# Patient Record
Sex: Male | Born: 1975 | Race: Black or African American | Hispanic: No | Marital: Married | Smoking: Never smoker
Health system: Southern US, Community
[De-identification: ages and names within clinical notes are randomized; demographics above are authoritative.]

## PROBLEM LIST (undated history)

## (undated) ENCOUNTER — Ambulatory Visit (HOSPITAL_COMMUNITY): Admission: EM | Discharge status: Absent without leave | Payer: Managed Care, Other (non HMO)

## (undated) DIAGNOSIS — D689 Coagulation defect, unspecified: Secondary | ICD-10-CM

## (undated) DIAGNOSIS — Z9889 Other specified postprocedural states: Secondary | ICD-10-CM

## (undated) DIAGNOSIS — D126 Benign neoplasm of colon, unspecified: Secondary | ICD-10-CM

## (undated) DIAGNOSIS — M109 Gout, unspecified: Secondary | ICD-10-CM

## (undated) DIAGNOSIS — K219 Gastro-esophageal reflux disease without esophagitis: Secondary | ICD-10-CM

## (undated) DIAGNOSIS — G4726 Circadian rhythm sleep disorder, shift work type: Secondary | ICD-10-CM

## (undated) DIAGNOSIS — R0683 Snoring: Secondary | ICD-10-CM

## (undated) DIAGNOSIS — R918 Other nonspecific abnormal finding of lung field: Secondary | ICD-10-CM

## (undated) DIAGNOSIS — R112 Nausea with vomiting, unspecified: Secondary | ICD-10-CM

## (undated) DIAGNOSIS — T7840XA Allergy, unspecified, initial encounter: Secondary | ICD-10-CM

## (undated) DIAGNOSIS — E041 Nontoxic single thyroid nodule: Secondary | ICD-10-CM

## (undated) DIAGNOSIS — N289 Disorder of kidney and ureter, unspecified: Secondary | ICD-10-CM

## (undated) DIAGNOSIS — F419 Anxiety disorder, unspecified: Secondary | ICD-10-CM

## (undated) DIAGNOSIS — I82409 Acute embolism and thrombosis of unspecified deep veins of unspecified lower extremity: Secondary | ICD-10-CM

## (undated) DIAGNOSIS — N1831 Chronic kidney disease, stage 3a: Secondary | ICD-10-CM

## (undated) DIAGNOSIS — G4710 Hypersomnia, unspecified: Secondary | ICD-10-CM

## (undated) DIAGNOSIS — K589 Irritable bowel syndrome without diarrhea: Secondary | ICD-10-CM

## (undated) HISTORY — PX: HEMORRHOID BANDING: SHX5850

## (undated) HISTORY — DX: Allergy, unspecified, initial encounter: T78.40XA

## (undated) HISTORY — DX: Coagulation defect, unspecified: D68.9

## (undated) HISTORY — DX: Other specified postprocedural states: Z98.890

## (undated) HISTORY — PX: UPPER GASTROINTESTINAL ENDOSCOPY: SHX188

## (undated) HISTORY — DX: Chronic kidney disease, stage 3a: N18.31

## (undated) HISTORY — DX: Snoring: R06.83

## (undated) HISTORY — DX: Circadian rhythm sleep disorder, shift work type: G47.26

## (undated) HISTORY — PX: ORIF TIBIA & FIBULA FRACTURES: SHX2131

## (undated) HISTORY — DX: Gastro-esophageal reflux disease without esophagitis: K21.9

## (undated) HISTORY — DX: Gout, unspecified: M10.9

## (undated) HISTORY — DX: Hypersomnia, unspecified: G47.10

## (undated) HISTORY — PX: COLONOSCOPY: SHX174

## (undated) HISTORY — DX: Other specified postprocedural states: R11.2

## (undated) HISTORY — DX: Anxiety disorder, unspecified: F41.9

## (undated) HISTORY — PX: OTHER SURGICAL HISTORY: SHX169

## (undated) HISTORY — DX: Other nonspecific abnormal finding of lung field: R91.8

## (undated) HISTORY — PX: COLON SURGERY: SHX602

## (undated) HISTORY — PX: ESOPHAGEAL DILATION: SHX303

## (undated) HISTORY — DX: Benign neoplasm of colon, unspecified: D12.6

## (undated) HISTORY — DX: Nontoxic single thyroid nodule: E04.1

---

## 2002-04-01 ENCOUNTER — Emergency Department (HOSPITAL_COMMUNITY): Admission: EM | Admit: 2002-04-01 | Discharge: 2002-04-01 | Payer: Self-pay | Admitting: Emergency Medicine

## 2002-06-10 ENCOUNTER — Emergency Department (HOSPITAL_COMMUNITY): Admission: EM | Admit: 2002-06-10 | Discharge: 2002-06-11 | Payer: Self-pay | Admitting: Emergency Medicine

## 2002-06-11 ENCOUNTER — Encounter: Payer: Self-pay | Admitting: Emergency Medicine

## 2003-02-03 ENCOUNTER — Emergency Department (HOSPITAL_COMMUNITY): Admission: EM | Admit: 2003-02-03 | Discharge: 2003-02-03 | Payer: Self-pay | Admitting: *Deleted

## 2003-02-03 ENCOUNTER — Encounter: Payer: Self-pay | Admitting: *Deleted

## 2007-02-02 ENCOUNTER — Ambulatory Visit: Payer: Self-pay | Admitting: Cardiology

## 2008-02-15 ENCOUNTER — Ambulatory Visit: Payer: Self-pay | Admitting: Gastroenterology

## 2008-02-27 ENCOUNTER — Emergency Department (HOSPITAL_COMMUNITY): Admission: EM | Admit: 2008-02-27 | Discharge: 2008-02-27 | Payer: Self-pay | Admitting: Emergency Medicine

## 2008-04-17 ENCOUNTER — Ambulatory Visit: Payer: Self-pay | Admitting: Gastroenterology

## 2008-06-24 ENCOUNTER — Emergency Department (HOSPITAL_COMMUNITY): Admission: EM | Admit: 2008-06-24 | Discharge: 2008-06-24 | Payer: Self-pay | Admitting: Emergency Medicine

## 2008-09-30 ENCOUNTER — Encounter (INDEPENDENT_AMBULATORY_CARE_PROVIDER_SITE_OTHER): Payer: Self-pay | Admitting: *Deleted

## 2008-09-30 ENCOUNTER — Emergency Department (HOSPITAL_COMMUNITY): Admission: EM | Admit: 2008-09-30 | Discharge: 2008-09-30 | Payer: Self-pay | Admitting: *Deleted

## 2008-12-02 ENCOUNTER — Ambulatory Visit: Payer: Self-pay | Admitting: Gastroenterology

## 2008-12-02 DIAGNOSIS — R1310 Dysphagia, unspecified: Secondary | ICD-10-CM

## 2008-12-02 DIAGNOSIS — R079 Chest pain, unspecified: Secondary | ICD-10-CM

## 2008-12-12 ENCOUNTER — Ambulatory Visit: Payer: Self-pay | Admitting: Gastroenterology

## 2008-12-13 ENCOUNTER — Telehealth: Payer: Self-pay | Admitting: Internal Medicine

## 2008-12-15 ENCOUNTER — Telehealth: Payer: Self-pay | Admitting: Gastroenterology

## 2009-01-16 ENCOUNTER — Ambulatory Visit: Payer: Self-pay | Admitting: Gastroenterology

## 2009-03-03 ENCOUNTER — Encounter: Payer: Self-pay | Admitting: Gastroenterology

## 2011-07-15 LAB — CBC
MCV: 87.6 fL (ref 78.0–100.0)
RBC: 5.3 MIL/uL (ref 4.22–5.81)
WBC: 7.2 10*3/uL (ref 4.0–10.5)

## 2011-07-15 LAB — POCT I-STAT, CHEM 8
BUN: 18 mg/dL (ref 6–23)
Creatinine, Ser: 1.6 mg/dL — ABNORMAL HIGH (ref 0.4–1.5)
Glucose, Bld: 108 mg/dL — ABNORMAL HIGH (ref 70–99)
Hemoglobin: 16.7 g/dL (ref 13.0–17.0)
Potassium: 4.2 mEq/L (ref 3.5–5.1)
Sodium: 141 mEq/L (ref 135–145)
TCO2: 27 mmol/L (ref 0–100)

## 2011-07-15 LAB — DIFFERENTIAL
Eosinophils Absolute: 0.3 10*3/uL (ref 0.0–0.7)
Eosinophils Relative: 5 % (ref 0–5)
Lymphocytes Relative: 35 % (ref 12–46)
Lymphs Abs: 2.5 10*3/uL (ref 0.7–4.0)
Monocytes Relative: 10 % (ref 3–12)

## 2011-07-15 LAB — POCT CARDIAC MARKERS
CKMB, poc: 1.8 ng/mL (ref 1.0–8.0)
CKMB, poc: 1.9 ng/mL (ref 1.0–8.0)
Myoglobin, poc: 109 ng/mL (ref 12–200)
Myoglobin, poc: 116 ng/mL (ref 12–200)

## 2011-11-22 ENCOUNTER — Ambulatory Visit: Payer: Self-pay | Admitting: Family Medicine

## 2011-12-05 ENCOUNTER — Emergency Department (HOSPITAL_COMMUNITY): Payer: Managed Care, Other (non HMO)

## 2011-12-05 ENCOUNTER — Encounter (HOSPITAL_COMMUNITY): Payer: Self-pay | Admitting: *Deleted

## 2011-12-05 ENCOUNTER — Emergency Department (HOSPITAL_COMMUNITY)
Admission: EM | Admit: 2011-12-05 | Discharge: 2011-12-05 | Disposition: A | Discharge status: Home / Self Care | Payer: Managed Care, Other (non HMO) | Attending: Emergency Medicine | Admitting: Emergency Medicine

## 2011-12-05 ENCOUNTER — Other Ambulatory Visit: Payer: Self-pay

## 2011-12-05 DIAGNOSIS — M545 Low back pain, unspecified: Secondary | ICD-10-CM | POA: Insufficient documentation

## 2011-12-05 DIAGNOSIS — H9190 Unspecified hearing loss, unspecified ear: Secondary | ICD-10-CM | POA: Insufficient documentation

## 2011-12-05 DIAGNOSIS — M542 Cervicalgia: Secondary | ICD-10-CM | POA: Insufficient documentation

## 2011-12-05 DIAGNOSIS — R079 Chest pain, unspecified: Secondary | ICD-10-CM | POA: Insufficient documentation

## 2011-12-05 DIAGNOSIS — M549 Dorsalgia, unspecified: Secondary | ICD-10-CM

## 2011-12-05 DIAGNOSIS — R51 Headache: Secondary | ICD-10-CM

## 2011-12-05 DIAGNOSIS — K589 Irritable bowel syndrome without diarrhea: Secondary | ICD-10-CM | POA: Insufficient documentation

## 2011-12-05 HISTORY — DX: Irritable bowel syndrome, unspecified: K58.9

## 2011-12-05 LAB — BASIC METABOLIC PANEL
Calcium: 9.2 mg/dL (ref 8.4–10.5)
Creatinine, Ser: 1.31 mg/dL (ref 0.50–1.35)
GFR calc Af Amer: 80 mL/min — ABNORMAL LOW (ref >90)
GFR calc non Af Amer: 69 mL/min — ABNORMAL LOW (ref >90)
Sodium: 140 mEq/L (ref 135–145)

## 2011-12-05 LAB — CBC
HCT: 44.5 % (ref 39.0–52.0)
MCHC: 34.6 g/dL (ref 30.0–36.0)
MCV: 86.1 fL (ref 78.0–100.0)
Platelets: 231 10*3/uL (ref 150–400)
RDW: 13.7 % (ref 11.5–15.5)

## 2011-12-05 LAB — DIFFERENTIAL
Basophils Absolute: 0.1 10*3/uL (ref 0.0–0.1)
Basophils Relative: 1 % (ref 0–1)
Eosinophils Absolute: 0.3 10*3/uL (ref 0.0–0.7)
Eosinophils Relative: 5 % (ref 0–5)
Monocytes Absolute: 0.7 10*3/uL (ref 0.1–1.0)

## 2011-12-05 NOTE — ED Provider Notes (Signed)
History     CSN: [id number]  Arrival date & time 12/05/11  1633   First MD Initiated Contact with Patient 12/05/11 1745      Chief Complaint  Patient presents with  . Chest Pain    (Consider location/radiation/quality/duration/timing/severity/associated sxs/prior treatment) HPI This 36 year old male has a chronic chest pain syndrome as well as chronic headaches neck pain and back pain.  Essentially several days a week if not every day per week he has several hours at a time of one or multiple symptoms including gradual onset headache and/or gradual onset neck pain and or left-sided chest pain and or low back pain. Each of these discomforts last for several hours at a time and can be associated with eating or activity or sleeping or seemingly non-provoked. He is no fever cough or shortness of breath abdominal pain vomiting or diarrhea or bloody stools. He is anxious and stressed but not suicidal or homicidal, he is not a threat to himself or others, is not hallucinating. He has no lateralizing weakness or numbness and no change in speech swallowing or understanding or significant change in vision. He does have some decreased hearing in his right ear since yesterday. The symptoms are not sudden onset. He has no shortness of breath or pleuritic chest pain. His chest wall is only sore and tender on the left side. He's had no treatment prior to arrival. Sometimes his chest discomfort is worse if he eats. All of his symptoms have gradually worsened over the last few months he has not seen his primary care doctor for them. His primary care doctor is located in [facility] by the name of McPherson(sp?).  All of the symptoms and presents today for several hours including headache neck pain back pain chest pain all day long today over 6 hours each. His symptoms are nonexertional and not sudden onset today. He has not tried any treatment other than Pepto-Bismol today which did not help he is  supposed to take Prevacid but has not taken it in months. Past Medical History  Diagnosis Date  . IBS (irritable bowel syndrome)   . Esophagitis     No past surgical history on file.  No family history on file.  History  Substance Use Topics  . Smoking status: Never Smoker   . Smokeless tobacco: Not on file  . Alcohol Use: No      Review of Systems  Constitutional: Negative for fever.       10 Systems reviewed and are negative for acute change except as noted in the HPI.  HENT: Positive for neck pain. Negative for congestion.   Eyes: Negative for discharge and redness.  Respiratory: Negative for cough and shortness of breath.   Cardiovascular: Positive for chest pain.  Gastrointestinal: Negative for vomiting and abdominal pain.  Musculoskeletal: Positive for back pain.  Skin: Negative for rash.  Neurological: Positive for headaches. Negative for syncope and numbness.  Psychiatric/Behavioral: Negative for hallucinations.       Anxiety    Allergies  Caffeine and Penicillins  Home Medications   Current Outpatient Rx  Name Route Sig Dispense Refill  . BISMUTH SUBSALICYLATE 262 MG PO CHEW Oral Chew 524 mg by mouth as needed.    Marland Kitchen FLAXSEED (LINSEED) PO Oral Take by mouth daily.    . IBUPROFEN 200 MG PO TABS Oral Take 200 mg by mouth as needed. For pain      BP 121/72  Pulse 72  Temp(Src) 97.9 F (36.6 C) (  Oral)  Resp 18  SpO2 98%  Physical Exam  Nursing note and vitals reviewed. Constitutional:       Awake, alert, nontoxic appearance with baseline speech for patient.  HENT:  Head: Atraumatic.  Mouth/Throat: No oropharyngeal exudate.  Eyes: EOM are normal. Pupils are equal, round, and reactive to light. Right eye exhibits no discharge. Left eye exhibits no discharge.  Neck: Neck supple.       Reproducible posterior paracervical nonmidline neck tenderness  Cardiovascular: Normal rate and regular rhythm.   No murmur heard. Pulmonary/Chest: Effort normal and  breath sounds normal. No stridor. No respiratory distress. He has no wheezes. He has no rales. He exhibits tenderness.       Partially reproducible left anterior lateral chest wall tenderness to palpation without rash or deformity  Abdominal: Soft. Bowel sounds are normal. He exhibits no mass. There is no tenderness. There is no rebound.  Musculoskeletal: He exhibits no edema and no tenderness.       Baseline ROM, moves extremities with no obvious new focal weakness.  Reproducible bilateral tender paralumbar soft tissue tenderness without midline lumbar tenderness  Lymphadenopathy:    He has no cervical adenopathy.  Neurological:       Awake, alert, cooperative and aware of situation; motor strength symmetric bilaterally; sensation normal to light touch bilaterally; peripheral visual fields full to confrontation; no facial asymmetry; tongue midline; major cranial nerves appear intact; no pronator drift, normal finger to nose bilaterally  Skin: No rash noted.  Psychiatric: He has a normal mood and affect.   right external auditory canal occluded by cerumen, left tympanic membrane clear  ED Course  Procedures (including critical care time) ECG: Normal sinus rhythm, ventricular rate 70, normal axis, normal intervals, impression normal ECG, no significant change compared with December 2009 Labs Reviewed  BASIC METABOLIC PANEL - Abnormal; Notable for the following:    GFR calc non Af Amer 69 (*)    GFR calc Af Amer 80 (*)    All other components within normal limits  TROPONIN I  CBC  DIFFERENTIAL  LAB REPORT - SCANNED   Dg Chest 2 View  12/05/2011  *RADIOLOGY REPORT*  Clinical Data: Chest pain.  CHEST - 2 VIEW  Comparison: 09/30/2008.  Findings: The cardiac silhouette, mediastinal and hilar contours are within normal limits and stable. The lungs are clear.  No pleural effusions.  The bony thorax is intact  IMPRESSION: No acute cardiopulmonary findings.  Original Report Authenticated By: P. Loralie Champagne, M.D.     1. Chest pain   2. Headache   3. Neck pain   4. Back pain       MDM  Pt stable in ED with no significant deterioration in condition.Patient / Family / Caregiver informed of clinical course, understand medical decision-making process, and agree with plan.I doubt any other EMC precluding discharge at this time including, but not necessarily limited to the following:high risk ACS, PE.        Hurman Horn, MD 12/06/11 [phone]

## 2011-12-05 NOTE — ED Notes (Signed)
No distress noted.  Pt denies pain at present time.  No dizziness or SOB.

## 2011-12-05 NOTE — ED Notes (Signed)
CP since yesterday that is intermittent, took [place]  today

## 2012-03-06 ENCOUNTER — Telehealth: Payer: Self-pay

## 2012-03-06 NOTE — Telephone Encounter (Signed)
PT STATES HE HAVE AN APPT WITH DR MCPHERSON ON THE 30TH AND WANT TO MAKE SURE WHAT ALL IS GOING TO BE DONE. HE THINK HE NEED TO FAST FOR A SUGAR CHECK, BUT WANTED TO VERTIFY. PLEASE CALL [phone]

## 2012-03-08 ENCOUNTER — Ambulatory Visit: Payer: Self-pay | Admitting: Family Medicine

## 2012-03-08 NOTE — Telephone Encounter (Signed)
Dr Audria Nine, I spoke w/pt and he seems to be very concerned to make sure he does not have DM. He has family hx and states that sometimes he feels a little dizzy and feels "kind of weird". He wondered if you can run some type of more accurate test for this instead of just the glucose on the CMET? Also do you want to do a lipid panel. Does pt need to be fasting? Pt has appt w/you next Thurs.

## 2012-03-08 NOTE — Telephone Encounter (Signed)
I reviewed pt's chart where he went to ED in February. We can discuss his concerns at his appt (I have never seen this gentleman). He will need to fast for labs (he can come in early the day of appointment to have blood drawn and I will order tests at time of visit based on what I think is appropriate).

## 2012-03-08 NOTE — Telephone Encounter (Signed)
Spoke to patient. He understands to come in fasting and the correct tests will be ordered.

## 2012-03-15 ENCOUNTER — Encounter: Payer: Self-pay | Admitting: Family Medicine

## 2012-03-15 ENCOUNTER — Ambulatory Visit (INDEPENDENT_AMBULATORY_CARE_PROVIDER_SITE_OTHER): Payer: Managed Care, Other (non HMO) | Admitting: Family Medicine

## 2012-03-15 VITALS — BP 133/88 | HR 69 | Temp 98.0°F | Resp 18 | Ht 74.75 in | Wt 311.6 lb

## 2012-03-15 DIAGNOSIS — E781 Pure hyperglyceridemia: Secondary | ICD-10-CM | POA: Insufficient documentation

## 2012-03-15 DIAGNOSIS — E669 Obesity, unspecified: Secondary | ICD-10-CM

## 2012-03-15 LAB — LIPID PANEL
Cholesterol: 171 mg/dL (ref 0–200)
Triglycerides: 136 mg/dL (ref <150)
VLDL: 27 mg/dL (ref 0–40)

## 2012-03-15 LAB — COMPREHENSIVE METABOLIC PANEL
AST: 20 U/L (ref 0–37)
Albumin: 4.1 g/dL (ref 3.5–5.2)
BUN: 15 mg/dL (ref 6–23)
CO2: 23 mEq/L (ref 19–32)
Calcium: 9.1 mg/dL (ref 8.4–10.5)
Chloride: 107 mEq/L (ref 96–112)
Glucose, Bld: 87 mg/dL (ref 70–99)
Potassium: 4.4 mEq/L (ref 3.5–5.3)

## 2012-03-15 NOTE — Progress Notes (Signed)
  Subjective:    Patient ID: Francis Lin, male    DOB: 08-Nov-1975, 36 y.o.   MRN: [id number]  HPI  Pt is 36 y.o. Obese AA male who has lipid disorder with elevated Triglycerides and low HDL.   He has started to be more active, playing with his kids. He works 3rd shift so his hours of activity  vary; he eats 2 meals daily on average (nutrition if fair at best). He does not get 7-8 hours of sleep and  water/fluid intake is not adequate.   Review of Systems  Constitutional: Positive for activity change. Negative for fever, appetite change, fatigue and unexpected weight change.  Respiratory: Negative for chest tightness and shortness of breath.   Cardiovascular: Negative for chest pain.  Gastrointestinal: Negative.   Neurological: Positive for headaches. Negative for dizziness, syncope, weakness and numbness.  Psychiatric/Behavioral: Positive for sleep disturbance.       Shift work sleep disturbance       Objective:   Physical Exam  Vitals reviewed. Constitutional: He is oriented to person, place, and time. He appears well-developed and well-nourished. No distress.  HENT:  Head: Normocephalic and atraumatic.  Eyes: Conjunctivae and EOM are normal. No scleral icterus.  Cardiovascular: Normal rate.   Pulmonary/Chest: Effort normal. No respiratory distress.  Musculoskeletal: Normal range of motion.  Neurological: He is alert and oriented to person, place, and time. No cranial nerve deficit.  Skin: Skin is warm and dry.  Psychiatric: He has a normal mood and affect. His behavior is normal.    Results for orders placed in visit on 03/15/12  POCT GLYCOSYLATED HEMOGLOBIN (HGB A1C)      Component Value Range   Hemoglobin A1C 5.1           Assessment & Plan:   1. Hypertriglyceridemia  Lipid panel, Comprehensive metabolic panel  2. Obesity -pt given print info about exercises for weight loss; encouraged more activity during warmer weather, increase water/ fluid intake (no sodas or  Kool-Aid), adequate sleep and better nutrition- he requires at least 1800 calories daily for better metabolism and weight reduction Lipid panel, Vitamin D, 25-hydroxy, POCT glycosylated hemoglobin (Hb A1C)

## 2012-03-15 NOTE — Patient Instructions (Signed)
Exercise to Lose Weight Exercise and a healthy diet may help you lose weight. Your doctor may suggest specific exercises. EXERCISE IDEAS AND TIPS  Choose low-cost things you enjoy doing, such as walking, bicycling, or exercising to workout videos.   Take stairs instead of the elevator.   Walk during your lunch break.   Park your car further away from work or school.   Go to a gym or an exercise class.   Start with 5 to 10 minutes of exercise each day. Build up to 30 minutes of exercise 4 to 6 days a week.   Wear shoes with good support and comfortable clothes.   Stretch before and after working out.   Work out until you breathe harder and your heart beats faster.   Drink extra water when you exercise.   Do not do so much that you hurt yourself, feel dizzy, or get very short of breath.     Diabetes Meal Planning Guide The diabetes meal planning guide is a tool to help you plan your meals and snacks. It is important for people with diabetes to manage their blood glucose (sugar) levels. Choosing the right foods and the right amounts throughout your day will help control your blood glucose. Eating right can even help you improve your blood pressure and reach or maintain a healthy weight. CARBOHYDRATE COUNTING MADE EASY When you eat carbohydrates, they turn to sugar. This raises your blood glucose level. Counting carbohydrates can help you control this level so you feel better. When you plan your meals by counting carbohydrates, you can have more flexibility in what you eat and balance your medicine with your food intake. Carbohydrate counting simply means adding up the total amount of carbohydrate grams in your meals and snacks. Try to eat about the same amount at each meal. Foods with carbohydrates are listed below. Each portion below is 1 carbohydrate serving or 15 grams of carbohydrates. Ask your dietician how many grams of carbohydrates you should eat at each meal or snack. Grains  and Starches 1 slice bread.   English muffin or hotdog/hamburger bun.   cup cold cereal (unsweetened).  ? cup cooked pasta or rice.   cup starchy vegetables (corn, potatoes, peas, beans, winter squash).  1 tortilla (6 inches).   bagel.  1 waffle or pancake (size of a CD).   cup cooked cereal.  4 to 6 small crackers.  *Whole grain is recommended. Fruit 1 cup fresh unsweetened berries, melon, papaya, pineapple.  1 small fresh fruit.   banana or mango.   cup fruit juice (4 oz unsweetened).   cup canned fruit in natural juice or water.  2 tbs dried fruit.  12 to 15 grapes or cherries.  Milk and Yogurt 1 cup fat-free or 1% milk.  1 cup soy milk.  6 oz light yogurt with sugar-free sweetener.  6 oz low-fat soy yogurt.  6 oz plain yogurt.  Vegetables 1 cup raw or  cup cooked is counted as 0 carbohydrates or a "free" food.  If you eat 3 or more servings at 1 meal, count them as 1 carbohydrate serving.  Other Carbohydrates  oz chips or pretzels.   cup ice cream or frozen yogurt.   cup sherbet or sorbet.  2 inch square cake, no frosting.  1 tbs honey, sugar, jam, jelly, or syrup.  2 small cookies.  3 squares of graham crackers.  3 cups popcorn.  6 crackers.  1 cup broth-based soup.  Count 1 cup  casserole or other mixed foods as 2 carbohydrate servings.  Foods with less than 20 calories in a serving may be counted as 0 carbohydrates or a "free" food.  You may want to purchase a book or computer software that lists the carbohydrate gram counts of different foods. In addition, the nutrition facts panel on the labels of the foods you eat are a good source of this information. The label will tell you how big the serving size is and the total number of carbohydrate grams you will be eating per serving. Divide this number by 15 to obtain the number of carbohydrate servings in a portion. Remember, 1 carbohydrate serving equals 15 grams of carbohydrate. SERVING SIZES Measuring  foods and serving sizes helps you make sure you are getting the right amount of food. The list below tells how big or small some common serving sizes are. 1 oz.........4 stacked dice.  3 oz........Marland KitchenDeck of cards.  1 tsp.......Marland KitchenTip of little finger.  1 tbs......Marland KitchenMarland KitchenThumb.  2 tbs.......Marland KitchenGolf ball.   cup......Marland KitchenHalf of a fist.  1 cup.......Marland KitchenA fist.  SAMPLE DIABETES MEAL PLAN Below is a sample meal plan that includes foods from the grain and starches, dairy, vegetable, fruit, and meat groups. A dietician can individualize a meal plan to fit your calorie needs and tell you the number of servings needed from each food group. However, controlling the total amount of carbohydrates in your meal or snack is more important than making sure you include all of the food groups at every meal. You may interchange carbohydrate containing foods (dairy, starches, and fruits). The meal plan below is an example of a 2000 calorie diet using carbohydrate counting. This meal plan has 17 carbohydrate servings. Breakfast 1 cup oatmeal (2 carb servings).   cup light yogurt (1 carb serving).  1 cup blueberries (1 carb serving).   cup almonds.  Snack 1 large apple (2 carb servings).  1 low-fat string cheese stick.  Lunch Chicken breast salad.  1 cup spinach.   cup chopped tomatoes.  2 oz chicken breast, sliced.  2 tbs low-fat [place] dressing.  12 whole-wheat crackers (2 carb servings).  12 to 15 grapes (1 carb serving).  1 cup low-fat milk (1 carb serving).  Snack 1 cup carrots.   cup hummus (1 carb serving).  Dinner 3 oz broiled salmon.  1 cup brown rice (3 carb servings).  Snack 1  cups steamed broccoli (1 carb serving) drizzled with 1 tsp olive oil and lemon juice.  1 cup light pudding (2 carb servings).  DIABETES MEAL PLANNING WORKSHEET Your dietician can use this worksheet to help you decide how many servings of foods and what types of foods are right for you.  BREAKFAST Food Group and Servings /  Carb Servings Grain/Starches __________________________________ Dairy __________________________________________ Vegetable ______________________________________ Fruit ___________________________________________ Meat __________________________________________ Fat ____________________________________________ LUNCH Food Group and Servings / Carb Servings Grain/Starches ___________________________________ Dairy ___________________________________________ Fruit ____________________________________________ Meat ___________________________________________ Fat _____________________________________________ La[place] Food Group and Servings / Carb Servings Grain/Starches ___________________________________ Dairy ___________________________________________ Fruit ____________________________________________ Meat ___________________________________________ Fat _____________________________________________ SNACKS Food Group and Servings / Carb Servings Grain/Starches ___________________________________ Dairy ___________________________________________ Vegetable _______________________________________ Fruit ____________________________________________ Meat ___________________________________________ Fat _____________________________________________ DAILY TOTALS Starches _________________________ Vegetable ________________________ Fruit ____________________________ Dairy ____________________________ Meat ____________________________ Fat ______________________________ Document Released: 06/23/2005 Document Revised: 09/15/2011 Document Reviewed: 05/04/2009  [employer] Patient Information 2012 [place], [place]. Exercises that burn about 150 calories:  Running 1  miles in 15 minutes.   Playing volleyball for 45 to 60 minutes.   Washing and waxing a  car for 45 to 60 minutes.   Playing touch football for 45 minutes.   Walking 1  miles in 35 minutes.   Pushing a stroller 1  miles in 30  minutes.   Playing basketball for 30 minutes.   Raking leaves for 30 minutes.   Bicycling 5 miles in 30 minutes.   Walking 2 miles in 30 minutes.   Dancing for 30 minutes.   Shoveling snow for 15 minutes.   Swimming laps for 20 minutes.   Walking up stairs for 15 minutes.   Bicycling 4 miles in 15 minutes.   Gardening for 30 to 45 minutes.   Jumping rope for 15 minutes.   Washing windows or floors for 45 to 60 minutes.  Document Released: 10/29/2010 Document Revised: 09/15/2011 Document Reviewed: 10/29/2010 [facility] Patient Information 2012 [place], [place].

## 2012-03-16 LAB — VITAMIN D 25 HYDROXY (VIT D DEFICIENCY, FRACTURES): Vit D, 25-Hydroxy: 22 ng/mL — ABNORMAL LOW (ref 30–89)

## 2012-03-19 ENCOUNTER — Encounter: Payer: Self-pay | Admitting: Family Medicine

## 2012-03-20 NOTE — Progress Notes (Signed)
Quick Note:  Please call pt and advise that the following labs are abnormal... Lipid profile shows LOW HDL("good") cholesterol - increase Flaxseed oil supplement to twice a day. Try to be more active to help lower LDL cholesterol and improve metabolism. Vitamin D is low also. Get Vitamin D3 2000 IU supplement and take it daily . Eat more fish (salmon, tuna, sardines) and mushrooms; Get daily sun exposure for 10-15 minutes.  Chemistries are normal.  Copy to pt. ______

## 2012-03-28 ENCOUNTER — Ambulatory Visit (INDEPENDENT_AMBULATORY_CARE_PROVIDER_SITE_OTHER): Payer: Managed Care, Other (non HMO) | Admitting: Family Medicine

## 2012-03-28 ENCOUNTER — Ambulatory Visit: Payer: Managed Care, Other (non HMO)

## 2012-03-28 VITALS — BP 120/77 | HR 70 | Temp 97.6°F | Resp 16 | Ht 75.5 in | Wt 315.0 lb

## 2012-03-28 DIAGNOSIS — M79609 Pain in unspecified limb: Secondary | ICD-10-CM

## 2012-03-28 DIAGNOSIS — M549 Dorsalgia, unspecified: Secondary | ICD-10-CM

## 2012-03-28 DIAGNOSIS — M79673 Pain in unspecified foot: Secondary | ICD-10-CM

## 2012-03-28 DIAGNOSIS — M722 Plantar fascial fibromatosis: Secondary | ICD-10-CM

## 2012-03-28 LAB — POCT URINALYSIS DIPSTICK
Blood, UA: NEGATIVE
Protein, UA: NEGATIVE
Spec Grav, UA: 1.025
Urobilinogen, UA: 0.2

## 2012-03-28 LAB — POCT UA - MICROSCOPIC ONLY
Casts, Ur, LPF, POC: NEGATIVE
Crystals, Ur, HPF, POC: NEGATIVE
Yeast, UA: NEGATIVE

## 2012-03-28 LAB — POCT SEDIMENTATION RATE: POCT SED RATE: 30 mm/hr — AB (ref 0–22)

## 2012-03-28 MED ORDER — OXAPROZIN 600 MG PO TABS: ORAL_TABLET | ORAL | Status: DC

## 2012-03-28 NOTE — Progress Notes (Signed)
Subjective: For the past several days patient has been having problems with pain in his foot. He knows of no injury. The left foot is with bothering him mostly. I think he may have a little bit of pain in the right foot but he he hurts in the heel and posterior part of the heel. It also hurts him down around the ball of the foot. He says that yesterday when he went to the bathroom to move his bowels he had back pain and on his kidneys might be bothering him.  Objective: No CVA tenderness. Good range of motion of back. Left foot is tender in the posterior aspect of the calcaneus as well as the anterior calcaneus. The great toe is not tender.  Assessment: Nonspecific low back pain Foot pain  Plan:  Foot x-ray Uric acid Sedimentation rate UA  Results for orders placed in visit on 03/28/12  POCT UA - MICROSCOPIC ONLY      Component Value Range   WBC, Ur, HPF, POC 0-2     RBC, urine, microscopic neg     Bacteria, U Microscopic trace     Mucus, UA trace     Epithelial cells, urine per micros 0-1     Crystals, Ur, HPF, POC neg     Casts, Ur, LPF, POC neg     Yeast, UA neg    POCT URINALYSIS DIPSTICK      Component Value Range   Color, UA yellow     Clarity, UA clear     Glucose, UA neg     Bilirubin, UA neg     Ketones, UA neg     Spec Grav, UA 1.025     Blood, UA neg     pH, UA 5.5     Protein, UA neg     Urobilinogen, UA 0.2     Nitrite, UA neg     Leukocytes, UA Negative     [facility] reading (PRIMARY) by  Dr. Alwyn Ren Normal foot.  .  This is most consistent with plantar fasciitis and some tendinitis at the insertion of the heel cord.  The patient was instructed on stretching the arch and ankle. We will see what his labs come back. If his foot is getting worse he is to let me know take Daypro twice daily for a couple weeks.

## 2012-03-28 NOTE — Patient Instructions (Addendum)
Plantar Fasciitis Plantar fasciitis is a common condition that causes foot pain. It is soreness (inflammation) of the band of tough fibrous tissue on the bottom of the foot that runs from the heel bone (calcaneus) to the ball of the foot. The cause of this soreness may be from excessive standing, poor fitting shoes, running on hard surfaces, being overweight, having an abnormal walk, or overuse (this is common in runners) of the painful foot or feet. It is also common in aerobic exercise dancers and ballet dancers. SYMPTOMS  Most people with plantar fasciitis complain of:  Severe pain in the morning on the bottom of their foot especially when taking the first steps out of bed. This pain recedes after a few minutes of walking.   Severe pain is experienced also during walking following a long period of inactivity.   Pain is worse when walking barefoot or up stairs  DIAGNOSIS   Your caregiver will diagnose this condition by examining and feeling your foot.   Special tests such as X-rays of your foot, are usually not needed.  PREVENTION   Consult a sports medicine professional before beginning a new exercise program.   Walking programs offer a good workout. With walking there is a lower chance of overuse injuries common to runners. There is less impact and less jarring of the joints.   Begin all new exercise programs slowly. If problems or pain develop, decrease the amount of time or distance until you are at a comfortable level.   Wear good shoes and replace them regularly.   Stretch your foot and the heel cords at the back of the ankle (Achilles tendon) both before and after exercise.   Run or exercise on even surfaces that are not hard. For example, asphalt is better than pavement.   Do not run barefoot on hard surfaces.   If using a treadmill, vary the incline.   Do not continue to workout if you have foot or joint problems. Seek professional help if they do not improve.  HOME CARE  INSTRUCTIONS   Avoid activities that cause you pain until you recover.   Use ice or cold packs on the problem or painful areas after working out.   Only take over-the-counter or prescription medicines for pain, discomfort, or fever as directed by your caregiver.   Soft shoe inserts or athletic shoes with air or gel sole cushions may be helpful.   If problems continue or become more severe, consult a sports medicine caregiver or your own health care provider. Cortisone is a potent anti-inflammatory medication that may be injected into the painful area. You can discuss this treatment with your caregiver.  MAKE SURE YOU:   Understand these instructions.   Will watch your condition.   Will get help right away if you are not doing well or get worse.  Document Released: 06/21/2001 Document Revised: 09/15/2011 Document Reviewed: 08/20/2008 [facility] Patient Information 2012 [place], [place].   Stop ibuprofen while on Daypro

## 2012-04-05 ENCOUNTER — Encounter: Payer: Self-pay | Admitting: Family Medicine

## 2012-04-15 ENCOUNTER — Emergency Department (HOSPITAL_COMMUNITY)
Admission: EM | Admit: 2012-04-15 | Discharge: 2012-04-15 | Disposition: A | Discharge status: Home / Self Care | Payer: Managed Care, Other (non HMO) | Attending: Emergency Medicine | Admitting: Emergency Medicine

## 2012-04-15 ENCOUNTER — Encounter (HOSPITAL_COMMUNITY): Payer: Self-pay | Admitting: Emergency Medicine

## 2012-04-15 DIAGNOSIS — Z880 Allergy status to penicillin: Secondary | ICD-10-CM | POA: Insufficient documentation

## 2012-04-15 DIAGNOSIS — Z91010 Allergy to peanuts: Secondary | ICD-10-CM | POA: Insufficient documentation

## 2012-04-15 DIAGNOSIS — K589 Irritable bowel syndrome without diarrhea: Secondary | ICD-10-CM | POA: Insufficient documentation

## 2012-04-15 DIAGNOSIS — M722 Plantar fascial fibromatosis: Secondary | ICD-10-CM

## 2012-04-15 DIAGNOSIS — M109 Gout, unspecified: Secondary | ICD-10-CM

## 2012-04-15 MED ORDER — PREDNISONE 20 MG PO TABS: ORAL_TABLET | ORAL | Status: DC

## 2012-04-15 NOTE — ED Notes (Signed)
Patient c/o left foot pain; states has been seen by MD for same.  Was given Oxaprozin; states "gout levels" were a little elevated and was told he may have plantar fasciitis.  Patient states he wants to be checked out for infection, stating it's under his toe nail.

## 2012-04-15 NOTE — ED Provider Notes (Addendum)
History    This chart was scribed for Osvaldo Human, MD, MD by Smitty Pluck. The patient was seen in room APA19 and the patient's care was started at 7:13AM.   CSN: [id number]  Arrival date & time 04/15/12  4098   First MD Initiated Contact with Patient 04/15/12 [phone]      Chief Complaint  Patient presents with  . Foot Pain    (Consider location/radiation/quality/duration/timing/severity/associated sxs/prior treatment) Patient is a 36 y.o. male presenting with lower extremity pain. The history is provided by the patient.  Foot Pain   Francis Lin is a 36 y.o. male who presents to the Emergency Department complaining of moderate left foot pain radiating to ankle and numbness in toes. Pt reports that he has had similar symptoms in past.  Pt reports that he went to [facility] June [facility]th and was told that he has plantars fasciitis. Pt reports he was tested for gout and his levels for uric acid were high. Pt reports taking oxaprozin for a short period and stopping medication so symptoms returned. Pt reports having A1C with nl results. Symptoms have been constant since onset.   Past Medical History  Diagnosis Date  . IBS (irritable bowel syndrome)   . Esophagitis     History reviewed. No pertinent past surgical history.  No family history on file.  History  Substance Use Topics  . Smoking status: Never Smoker   . Smokeless tobacco: Not on file  . Alcohol Use: No      Review of Systems  All other systems reviewed and are negative.   10 Systems reviewed and all are negative for acute change except as noted in the HPI.   Allergies  Caffeine; Omeprazole; Peanuts; and Penicillins  Home Medications   Current Outpatient Rx  Name Route Sig Dispense Refill  . BISMUTH SUBSALICYLATE 262 MG PO CHEW Oral Chew 524 mg by mouth as needed.    Marland Kitchen FLAXSEED (LINSEED) PO Oral Take by mouth daily.    . IBUPROFEN 200 MG PO TABS Oral Take 200 mg by mouth as needed. For pain    . OXAPROZIN  600 MG PO TABS  Take one twice daily for pain and inflammation in foot. 30 tablet 1    BP 137/59  Pulse 83  Temp 98.5 F (36.9 C) (Oral)  Resp 20  SpO2 97%  Physical Exam  Nursing note and vitals reviewed. Constitutional: He is oriented to person, place, and time. He appears well-developed and well-nourished. No distress.       Obese   HENT:  Head: Normocephalic and atraumatic.  Eyes: EOM are normal. Pupils are equal, round, and reactive to light.  Neck: Normal range of motion. Neck supple. No tracheal deviation present.  Cardiovascular: Normal rate.   Pulmonary/Chest: Effort normal. No respiratory distress.  Abdominal: Soft. He exhibits no distension.  Musculoskeletal: Normal range of motion.       Tenderness of sole of left foot along heel No abnormality of toes Warm and well perfused  Neurovascular intact   Neurological: He is alert and oriented to person, place, and time.  Skin: Skin is warm and dry.  Psychiatric: He has a normal mood and affect. His behavior is normal.    ED Course  Procedures (including critical care time) DIAGNOSTIC STUDIES: Oxygen Saturation is 97% on room air, normal by my interpretation.    COORDINATION OF CARE: 7:16AM EDP discusses pt ED treatment with pt.   Advised to continue Daypro, take prednisone  taper to treat plantar fasciitis, gout.  Advised to take meds with food or milk so they don't upset his stomach.     1. Plantar fasciitis   2. Gout     I personally performed the services described in this documentation, which was scribed in my presence. The recorded information has been reviewed and considered.  Osvaldo Human, M.D.     Carleene Cooper III, MD 04/15/12 1610     Carleene Cooper III, MD 04/15/12 [id number]  Carleene Cooper III, MD 04/24/12 1317  Carleene Cooper III, MD 04/24/12 [phone]

## 2012-06-14 ENCOUNTER — Encounter: Payer: Self-pay | Admitting: Family Medicine

## 2012-08-17 ENCOUNTER — Ambulatory Visit (INDEPENDENT_AMBULATORY_CARE_PROVIDER_SITE_OTHER): Payer: Managed Care, Other (non HMO) | Admitting: Family Medicine

## 2012-08-17 ENCOUNTER — Encounter: Payer: Self-pay | Admitting: Family Medicine

## 2012-08-17 VITALS — BP 120/86 | HR 64 | Temp 98.6°F | Resp 16 | Ht 74.5 in | Wt 307.4 lb

## 2012-08-17 DIAGNOSIS — R52 Pain, unspecified: Secondary | ICD-10-CM

## 2012-08-17 DIAGNOSIS — M109 Gout, unspecified: Secondary | ICD-10-CM

## 2012-08-17 DIAGNOSIS — R202 Paresthesia of skin: Secondary | ICD-10-CM

## 2012-08-17 DIAGNOSIS — M549 Dorsalgia, unspecified: Secondary | ICD-10-CM

## 2012-08-17 DIAGNOSIS — M722 Plantar fascial fibromatosis: Secondary | ICD-10-CM

## 2012-08-17 DIAGNOSIS — M79673 Pain in unspecified foot: Secondary | ICD-10-CM

## 2012-08-17 DIAGNOSIS — R209 Unspecified disturbances of skin sensation: Secondary | ICD-10-CM

## 2012-08-17 DIAGNOSIS — E559 Vitamin D deficiency, unspecified: Secondary | ICD-10-CM

## 2012-08-17 MED ORDER — OXAPROZIN 600 MG PO TABS: ORAL_TABLET | ORAL | Status: DC

## 2012-08-17 NOTE — Progress Notes (Signed)
S. This 36 y.o AA male c/o joint stiffness in fingers and knees along with paresthesias in fingers.  Also c/o pain in "kidney area" but denies hematuria or dysuria. He reports generalized joint pain and mentions"gout" as a possible reason. He gives a vague history of diagnosis and treatment for acute flare in the past. He has done some reading about this and made changes in his nutrition. Medication prescribed in June by Dr. Alwyn Ren was effective for pain.  ROS: Negative for unexplained weight change, fever/chills, diaphoresis, fatigue, rashes, joint redness or warmth, HA, weakness, cold or heat intolerance.  O:  Filed Vitals:   08/17/12 1303  BP: 120/86                                        Weight down 8 lbs since June  Pulse: 64  Temp: 98.6 F (37 C)  Resp: 16   GEN: In NAD; obese. COR: RRR. LUNGS: Normal resp rate and effort. BACK: Spine straight; No CVAT. + muscle spasms EXT: FROM w/o effusions or crepitus. Metacarpal compression test is negative. Grip is very good. No clubbing or edema. Heel tenderness on right calcaneous. NEURO: A&O x 3; CNs intact. DTRs difficult to illicit. Gait is normal.    A/P: 1. Body aches  TSH, T4, Free  2. Vitamin D deficiency  Vitamin D, 25-hydroxy  3. Gout  Uric Acid  4. Hand paresthesia    5. Back pain  oxaprozin (DAYPRO) 600 MG tablet  6. Foot pain  oxaprozin (DAYPRO) 600 MG tablet  7. Plantar fasciitis  oxaprozin (DAYPRO) 600 MG tablet   Advised pt to replace footwear or, at least, get new insoles. Weight reduction would be helpful also.

## 2012-08-17 NOTE — Patient Instructions (Addendum)
Gout Gout is an inflammatory condition (arthritis) caused by a buildup of uric acid crystals in the joints. Uric acid is a chemical that is normally present in the blood. Under some circumstances, uric acid can form into crystals in your joints. This causes joint redness, soreness, and swelling (inflammation). Repeat attacks are common. Over time, uric acid crystals can form into masses (tophi) near a joint, causing disfigurement. Gout is treatable and often preventable. CAUSES  The disease begins with elevated levels of uric acid in the blood. Uric acid is produced by your body when it breaks down a naturally found substance called purines. This also happens when you eat certain foods such as meats and fish. Causes of an elevated uric acid level include:  Being passed down from parent to child (heredity).  Diseases that cause increased uric acid production (obesity, psoriasis, some cancers).  Excessive alcohol use.  Diet, especially diets rich in meat and seafood.  Medicines, including certain cancer-fighting drugs (chemotherapy), diuretics, and aspirin.  Chronic kidney disease. The kidneys are no longer able to remove uric acid well.  Problems with metabolism. Conditions strongly associated with gout include:  Obesity.  High blood pressure.  High cholesterol.  Diabetes. Not everyone with elevated uric acid levels gets gout. It is not understood why some people get gout and others do not. Surgery, joint injury, and eating too much of certain foods are some of the factors that can lead to gout. SYMPTOMS   An attack of gout comes on quickly. It causes intense pain with redness, swelling, and warmth in a joint.  Fever can occur.  Often, only one joint is involved. Certain joints are more commonly involved:  Base of the big toe.  Knee.  Ankle.  Wrist.  Finger. Without treatment, an attack usually goes away in a few days to weeks. Between attacks, you usually will not have  symptoms, which is different from many other forms of arthritis. DIAGNOSIS  Your caregiver will suspect gout based on your symptoms and exam. Removal of fluid from the joint (arthrocentesis) is done to check for uric acid crystals. Your caregiver will give you a medicine that numbs the area (local anesthetic) and use a needle to remove joint fluid for exam. Gout is confirmed when uric acid crystals are seen in joint fluid, using a special microscope. Sometimes, blood, urine, and X-ray tests are also used. TREATMENT  There are 2 phases to gout treatment: treating the sudden onset (acute) attack and preventing attacks (prophylaxis). Treatment of an Acute Attack  Medicines are used. These include anti-inflammatory medicines or steroid medicines.  An injection of steroid medicine into the affected joint is sometimes necessary.  The painful joint is rested. Movement can worsen the arthritis.  You may use warm or cold treatments on painful joints, depending which works best for you.  Discuss the use of coffee, vitamin C, or cherries with your caregiver. These may be helpful treatment options. Treatment to Prevent Attacks After the acute attack subsides, your caregiver may advise prophylactic medicine. These medicines either help your kidneys eliminate uric acid from your body or decrease your uric acid production. You may need to stay on these medicines for a very long time. The early phase of treatment with prophylactic medicine can be associated with an increase in acute gout attacks. For this reason, during the first few months of treatment, your caregiver may also advise you to take medicines usually used for acute gout treatment. Be sure you understand your caregiver's directions.   You should also discuss dietary treatment with your caregiver. Certain foods such as meats and fish can increase uric acid levels. Other foods such as dairy can decrease levels. Your caregiver can give you a list of foods  to avoid. HOME CARE INSTRUCTIONS   Do not take aspirin to relieve pain. This raises uric acid levels.  Only take over-the-counter or prescription medicines for pain, discomfort, or fever as directed by your caregiver.  Rest the joint as much as possible. When in bed, keep sheets and blankets off painful areas.  Keep the affected joint raised (elevated).  Use crutches if the painful joint is in your leg.  Drink enough water and fluids to keep your urine clear or pale yellow. This helps your body get rid of uric acid. Do not drink alcoholic beverages. They slow the passage of uric acid.  Follow your caregiver's dietary instructions. Pay careful attention to the amount of protein you eat. Your daily diet should emphasize fruits, vegetables, whole grains, and fat-free or low-fat milk products.  Maintain a healthy body weight. SEEK MEDICAL CARE IF:   You have an oral temperature above 102 F (38.9 C).  You develop diarrhea, vomiting, or any side effects from medicines.  You do not feel better in 24 hours, or you are getting worse. SEEK IMMEDIATE MEDICAL CARE IF:   Your joint becomes suddenly more tender and you have:  Chills.  An oral temperature above 102 F (38.9 C), not controlled by medicine. MAKE SURE YOU:   Understand these instructions.  Will watch your condition.  Will get help right away if you are not doing well or get worse. Document Released: 09/23/2000 Document Revised: 12/19/2011 Document Reviewed: 01/04/2010 [facility] Patient Information 2013 [place], [place].   Medication for pain has been refilled; take this medication at least once a day (you can take it twice a day with food for pain). Take over-the-counter Vitamin D3 1000 IU  1 capsules twice a day.  Drink more water daily; drink at least 4 20 -ounce bottles of water every day. Try to avoid sodas. I may have to prescribe medication for GOUT if uric acid is above normal. Continue taking Flax Seed Oil  capsule daily.

## 2012-08-21 ENCOUNTER — Encounter: Payer: Self-pay | Admitting: Family Medicine

## 2012-08-21 ENCOUNTER — Other Ambulatory Visit: Payer: Self-pay | Admitting: Family Medicine

## 2012-08-21 MED ORDER — COLCHICINE 0.6 MG PO TABS: 0.6000 mg | ORAL_TABLET | Freq: Every day | ORAL | Status: DC

## 2012-08-21 NOTE — Progress Notes (Signed)
Quick Note:  Please call pt and advise that the following labs are abnormal... Vitamin D level is still below 30. Pt needs to take OTC Vitamin D3 1000 IU 1 capsule twice a day as discussed at visit and increase Vit D foods in diet (slamon, tuna, eggs- 3/ week, mushrooms, low -fat dairy products).  Uric level is above normal= GOUT. I am prescribing Colcrys 0.6 mg 1 tablet daily; medication routed to his pharmacy.  Pt needs to still avoid foods that cause increase uric acid (he is aware) and drink at least 1.5 liters of fluid daily.  Thyroid tests are normal.  Copy to pt. ______

## 2012-08-30 ENCOUNTER — Other Ambulatory Visit: Payer: Self-pay | Admitting: Family Medicine

## 2012-08-30 ENCOUNTER — Telehealth: Payer: Self-pay

## 2012-08-30 DIAGNOSIS — L989 Disorder of the skin and subcutaneous tissue, unspecified: Secondary | ICD-10-CM

## 2012-08-30 DIAGNOSIS — M2550 Pain in unspecified joint: Secondary | ICD-10-CM

## 2012-08-30 NOTE — Telephone Encounter (Signed)
Notes Recorded by Bronson Curb, CMA on 08/22/2012 at 11:26 AM Gave patient lab results. Mailed patient copy. ------  Notes Recorded by Maurice March, MD on 08/21/2012 at 6:45 PM Please call pt and advise that the following labs are abnormal... Vitamin D level is still below 30. Pt needs to take OTC Vitamin D3 1000 IU 1 capsule twice a day as discussed at visit and increase Vit D foods in diet (slamon, tuna, eggs- 3/ week, mushrooms, low -fat dairy products).  Uric level is above normal= GOUT. I am prescribing Colcrys 0.6 mg 1 tablet daily; medication routed to his pharmacy. Pt needs to still avoid foods that cause increase uric acid (he is aware) and drink at least 1.5 liters of fluid daily.  Thyroid tests are normal.   Left message for him to call back about this.

## 2012-08-30 NOTE — Telephone Encounter (Signed)
Pt is needing to talk with dr Audria Nine about his lab results that he has already gotten and possible referral   Best [phone]

## 2012-08-30 NOTE — Telephone Encounter (Signed)
Patient wants to see a Rheumatologist for joint pain, patient thinks the psoriasis he has may be causing his joint pains,  wants Dr Audria Nine to refer.

## 2012-08-30 NOTE — Telephone Encounter (Signed)
He also indicated the psoriasis has not been diagnosed formerly, so he wants to know if he should also see a dermatologist. Please advise.

## 2012-08-31 ENCOUNTER — Telehealth: Payer: Self-pay | Admitting: Family Medicine

## 2012-08-31 NOTE — Telephone Encounter (Signed)
Referrals to both specialties completed. Please let pt know.

## 2012-09-01 NOTE — Telephone Encounter (Signed)
Thanks patient advised.  

## 2012-11-22 ENCOUNTER — Ambulatory Visit: Payer: Managed Care, Other (non HMO) | Admitting: Family Medicine

## 2013-01-03 ENCOUNTER — Encounter: Payer: Self-pay | Admitting: Family Medicine

## 2013-01-03 ENCOUNTER — Ambulatory Visit (INDEPENDENT_AMBULATORY_CARE_PROVIDER_SITE_OTHER): Payer: Managed Care, Other (non HMO) | Admitting: Family Medicine

## 2013-01-03 VITALS — BP 122/70 | HR 74 | Temp 98.1°F | Resp 16 | Ht 74.0 in | Wt 306.2 lb

## 2013-01-03 DIAGNOSIS — E786 Lipoprotein deficiency: Secondary | ICD-10-CM

## 2013-01-03 DIAGNOSIS — R1012 Left upper quadrant pain: Secondary | ICD-10-CM

## 2013-01-03 DIAGNOSIS — M109 Gout, unspecified: Secondary | ICD-10-CM

## 2013-01-03 DIAGNOSIS — E559 Vitamin D deficiency, unspecified: Secondary | ICD-10-CM

## 2013-01-03 LAB — LIPID PANEL
Cholesterol: 146 mg/dL (ref 0–200)
LDL Cholesterol: 82 mg/dL (ref 0–99)
Total CHOL/HDL Ratio: 5.4 Ratio
VLDL: 37 mg/dL (ref 0–40)

## 2013-01-03 NOTE — Progress Notes (Signed)
S: This 37 y.o. AA male returns for follow-up of Vitamin D deficiency and joint pain, diagnosed as gout flare. Pt got OTC Vitamin D (not prescription) 400 IU and reports problems w/ GI tolerance. He did not take Colcrys because of concerns about medication lowering sperm count. He has started exercising 2 days/week at work and requests some labs for review by his fitness coach.  His weight loss goal is 10-15 pounds in next 3-4 months.  Pt c/o L abdominal wall discomfort especially w/ heavy lifting; he is concerned he may have a hernia. This problem did develop over a period of time and seems to be work-related; his work involves lifting significant weight. He denies n/v or changes in bowel habits. He has no pain or abnormality in the groin area.  ROS: As per  HPI.  O:  Filed Vitals:   01/03/13 1017  BP: 122/70  Pulse: 74  Temp: 98.1 F (36.7 C)  Resp: 16   GEN: In NAD: WN,WD. Pt is obese. HENT: [place]/AT; eomi w/ clear conj/ sclerae. Nose is normal. Orooph clear and moist. NECK: Supple w/o  LAN or TMG. COR: RRR. LUNGS: CTA; normal resp rate and effort. ABD: Obese; tender L side in hypogastric area. No definitive defect felt in abd wall muscle. Unable to detect internal organomegaly. NEURO: A&O x 3; CNs intact. Nonfocal.  A/P: Unspecified vitamin D deficiency - pt taking OTC Vit D3 500 mg daily; he reports intolerance of higher doses.  Plan: Vitamin D, 25-hydroxy  Gout - pt not taking any  Medication as prescribed.  Plan: Uric Acid  Low HDL (under 40) - Plan: Lipid panel  Abdominal wall pain in left upper quadrant - Plan: CT Abdomen Pelvis W Contrast

## 2013-01-03 NOTE — Patient Instructions (Addendum)
You will be contacted about your labs. Our staff will call you with the appointment for CT of abdomen/pelvis once it is scheduled. This is the test that will help determine if you have a hernia.   Hernia A hernia occurs when an internal organ pushes out through a weak spot in the abdominal wall. Hernias most commonly occur in the groin and around the navel. Hernias often can be pushed back into place (reduced). Most hernias tend to get worse over time. Some abdominal hernias can get stuck in the opening (irreducible or incarcerated hernia) and cannot be reduced. An irreducible abdominal hernia which is tightly squeezed into the opening is at risk for impaired blood supply (strangulated hernia). A strangulated hernia is a medical emergency. Because of the risk for an irreducible or strangulated hernia, surgery may be recommended to repair a hernia. CAUSES   Heavy lifting.  Prolonged coughing.  Straining to have a bowel movement.  A cut (incision) made during an abdominal surgery. HOME CARE INSTRUCTIONS   Bed rest is not required. You may continue your normal activities.  Avoid lifting more than 10 pounds (4.5 kg) or straining.  Cough gently. If you are a smoker it is best to stop. Even the best hernia repair can break down with the continual strain of coughing. Even if you do not have your hernia repaired, a cough will continue to aggravate the problem.  Do not wear anything tight over your hernia. Do not try to keep it in with an outside bandage or truss. These can damage abdominal contents if they are trapped within the hernia sac.  Eat a normal diet.  Avoid constipation. Straining over long periods of time will increase hernia size and encourage breakdown of repairs. If you cannot do this with diet alone, stool softeners may be used. SEEK IMMEDIATE MEDICAL CARE IF:   You have a fever.  You develop increasing abdominal pain.  You feel nauseous or vomit.  Your hernia is stuck  outside the abdomen, looks discolored, feels hard, or is tender.  You have any changes in your bowel habits or in the hernia that are unusual for you.  You have increased pain or swelling around the hernia.  You cannot push the hernia back in place by applying gentle pressure while lying down. MAKE SURE YOU:   Understand these instructions.  Will watch your condition.  Will get help right away if you are not doing well or get worse. Document Released: 09/26/2005 Document Revised: 12/19/2011 Document Reviewed: 05/15/2008 [facility] Patient Information 2013 [place], [place].

## 2013-01-04 LAB — VITAMIN D 25 HYDROXY (VIT D DEFICIENCY, FRACTURES): Vit D, 25-Hydroxy: 15 ng/mL — ABNORMAL LOW (ref 30–89)

## 2013-01-05 NOTE — Progress Notes (Signed)
Quick Note:  Please contact pt and advise that the following labs are abnormal... Lipid panel shows low HDL and above normal Triglycerides. Add a Fish Oil supplement 1200 mg daily to current supplement.  Uric acid level (blood test for gout) is still above normal; eliminate foods that can cause this increase as well as avoiding alcohol (if you are consuming alcohol). Maintain very good hydration with water. If you Decide to take the prescription medication for gout, notify our clinical staff.  Your Vitamin D level is still very low; you reported that you can tolerate high dose Vitamin D so you need to try to take your current supplement (500 mg) 3 times a day to get you level up near normal. Also need to eat more of the foods we discussed that are rich sources of Vitamin D. Get some sun exposure most days of the week.  Copy to pt. ______

## 2013-01-08 ENCOUNTER — Other Ambulatory Visit: Payer: Self-pay | Admitting: Family Medicine

## 2013-01-08 DIAGNOSIS — R1012 Left upper quadrant pain: Secondary | ICD-10-CM

## 2013-01-08 NOTE — Progress Notes (Signed)
Pt c/o LUQ/ hypogastric pain at recent visit; I suspect a hernia and ordered an Abd CT. Insurer denied this test, reason being that Abd [place] should be first test ordered. I have ordered this test as 1st step.

## 2013-01-17 ENCOUNTER — Telehealth: Payer: Self-pay

## 2013-01-17 ENCOUNTER — Ambulatory Visit
Admission: RE | Admit: 2013-01-17 | Discharge: 2013-01-17 | Disposition: A | Discharge status: Home / Self Care | Payer: Managed Care, Other (non HMO) | Source: Ambulatory Visit | Attending: Family Medicine | Admitting: Family Medicine

## 2013-01-17 DIAGNOSIS — Z889 Allergy status to unspecified drugs, medicaments and biological substances status: Secondary | ICD-10-CM

## 2013-01-17 DIAGNOSIS — R1012 Left upper quadrant pain: Secondary | ICD-10-CM

## 2013-01-17 NOTE — Telephone Encounter (Signed)
I called pt and left msg re: request for Allergist eval. Order for referral has been placed.

## 2013-01-17 NOTE — Telephone Encounter (Signed)
PATIENT WANTS REFERRAL TO AN ALLERGIST. PLEASE CALL AT [phone].

## 2013-01-18 ENCOUNTER — Telehealth: Payer: Self-pay | Admitting: Family Medicine

## 2013-01-18 ENCOUNTER — Telehealth: Payer: Self-pay

## 2013-01-18 NOTE — Telephone Encounter (Signed)
Left a message to inform pt about abdominal US- essentially normal. 2 very small benign-appearing lesions in liver (hemangiomas) that do not require further evaluation. Pt advised to contact office if abd discomfort does not subside or worsens; further evaluation may be warranted. I suspect pt has pulled abd muscle.

## 2013-01-18 NOTE — Telephone Encounter (Signed)
Patient has question regarding a telephone message from Dr. Audria Nine earlier today about his ultrasound.  Requests a call back early next week.  Will not be available this afternoon.

## 2013-01-24 NOTE — Telephone Encounter (Signed)
I reviewed Abd [place] results w/ pt; essentially normal w/ incidental finding of benign hemangiomas within R lobe of liver. Pt is sch to have eval w/ Allergist at [facility] to  See if he has possible wheat allergy as cause of GI problems.

## 2013-02-19 ENCOUNTER — Ambulatory Visit: Payer: Managed Care, Other (non HMO) | Admitting: Family Medicine

## 2013-02-21 ENCOUNTER — Ambulatory Visit: Payer: Managed Care, Other (non HMO) | Admitting: Family Medicine

## 2013-03-21 ENCOUNTER — Ambulatory Visit: Payer: Managed Care, Other (non HMO) | Admitting: Family Medicine

## 2013-03-26 ENCOUNTER — Ambulatory Visit: Payer: Managed Care, Other (non HMO) | Admitting: Family Medicine

## 2013-04-16 ENCOUNTER — Ambulatory Visit (INDEPENDENT_AMBULATORY_CARE_PROVIDER_SITE_OTHER): Payer: Managed Care, Other (non HMO) | Admitting: Family Medicine

## 2013-04-16 ENCOUNTER — Other Ambulatory Visit: Payer: Self-pay | Admitting: Family Medicine

## 2013-04-16 ENCOUNTER — Encounter: Payer: Self-pay | Admitting: Family Medicine

## 2013-04-16 VITALS — BP 127/80 | HR 69 | Temp 98.5°F | Resp 18 | Ht 74.0 in | Wt 297.0 lb

## 2013-04-16 DIAGNOSIS — R7989 Other specified abnormal findings of blood chemistry: Secondary | ICD-10-CM

## 2013-04-16 DIAGNOSIS — M109 Gout, unspecified: Secondary | ICD-10-CM | POA: Insufficient documentation

## 2013-04-16 DIAGNOSIS — M1A00X0 Idiopathic chronic gout, unspecified site, without tophus (tophi): Secondary | ICD-10-CM | POA: Insufficient documentation

## 2013-04-16 DIAGNOSIS — M25579 Pain in unspecified ankle and joints of unspecified foot: Secondary | ICD-10-CM | POA: Insufficient documentation

## 2013-04-16 DIAGNOSIS — Z8719 Personal history of other diseases of the digestive system: Secondary | ICD-10-CM

## 2013-04-16 DIAGNOSIS — R799 Abnormal finding of blood chemistry, unspecified: Secondary | ICD-10-CM

## 2013-04-16 LAB — BASIC METABOLIC PANEL
BUN: 12 mg/dL (ref 6–23)
CO2: 26 mEq/L (ref 19–32)
Calcium: 9.2 mg/dL (ref 8.4–10.5)
Glucose, Bld: 79 mg/dL (ref 70–99)
Sodium: 140 mEq/L (ref 135–145)

## 2013-04-16 MED ORDER — LANSOPRAZOLE 30 MG PO CPDR: 30.0000 mg | DELAYED_RELEASE_CAPSULE | Freq: Every day | ORAL | Status: DC

## 2013-04-16 MED ORDER — TRAMADOL HCL 50 MG PO TABS: 50.0000 mg | ORAL_TABLET | Freq: Three times a day (TID) | ORAL | Status: DC | PRN

## 2013-04-16 NOTE — Patient Instructions (Addendum)
Peptic Ulcer A peptic ulcer is a painful sore in the lining of in your esophagus, stomach, or in the first part of your small intestine. The main causes of an ulcer can be:  An infection.  Using certain pain medicines too often or too much.  Smoking. HOME CARE  Avoid smoking, alcohol, and caffeine.  Avoid foods that bother you.  Only take medicine as told by your doctor. Do not take any medicines your doctor has not approved.  Keep all doctor visits as told. GET HELP RIGHT AWAY IF:  You do not get better in 7 days after starting treatment.  You keep having an upset stomach (indigestion) or heartburn.  You have sudden, sharp, or lasting belly (abdominal) pain.  You have bloody, black, or tarry poop (stool).  You throw up (vomit) blood or your throw up looks like coffee grounds.  You get light headed, weak, or feel like you will pass out (faint).  You get sweaty or feel sticky and cold to the touch (clammy). MAKE SURE YOU:   Understand these instructions.  Will watch your condition.  Will get help right away if you are not doing well or get worse. Document Released: 12/21/2009 Document Revised: 06/20/2012 Document Reviewed: 04/25/2012 [facility] Patient Information 2014 [place], [place].    Esophageal Stricture The esophagus is the long, narrow tube which carries food and liquid from the mouth to the stomach. Sometimes a part of the esophagus becomes narrow and makes it difficult, painful, or even impossible to swallow. This is called an esophageal stricture.  CAUSES  Common causes of blockage or strictures of the esophagus are:  Exposure of the lower esophagus to the acid from the stomach may cause narrowing.  Hiatal hernia in which a small part of the stomach bulges up through the diaphragm can cause a narrowing in the bottom of the esophagus.  Scleroderma is a tissue disorder that affects the esophagus and makes swallowing difficult.  Achalasia is an absence of  nerves in the lower esophagus and to the esophageal sphincter. This absence of nerves may be congenital (present since birth). This can cause irregular spasms which do not allow food and fluid through.  Strictures may develop from swallowing materials which damage the esophagus. Examples are acids or alkalis such as lye.  Schatzki's Ring is a narrow ring of non-cancerous tissue which narrows the lower esophagus. The cause of this is unknown.  Growths can block the esophagus. SYMPTOMS  Some of the problems are difficulty swallowing or pain with swallowing. DIAGNOSIS  Your caregiver often suspects this problem by taking a medical history. They will also do a physical exam. They may then take X-rays and/or perform an endoscopy. Endoscopy is an exam in which a tube like a small flexible telescope is used to look at your esophagus.  TREATMENT  One form of treatment is to dilate the narrow area. This means to stretch it.  When this is not successful, chest surgery may be required. This is a much more extensive form of treatment with a longer recovery time. Both of the above treatments make the passage of food and water into the stomach easier. They also make it easier for stomach contents to bubble back into the esophagus. Special medications may be used following the procedure to help prevent further narrowing. Medications may be used to lower the amount of acid in the stomach juice.  SEEK IMMEDIATE MEDICAL CARE IF:   Your swallowing is becoming more painful, difficult, or you are  unable to swallow.  You vomit up blood.  You develop black tarry stools.  You develop chills.  You have a fever.  You develop chest or abdominal pain.  You develop shortness of breath, feel lightheaded, or faint. Follow up with medical care as your caregiver suggests. Document Released: 06/06/2006 Document Revised: 12/19/2011 Document Reviewed: 07/13/2006 [facility] Patient Information 2014 [place],  [place].   Continue to take Lansoprazole daily and change your diet so that you are eating healthier and are not eating foods that irritate the stomach. Weight loss is important also.  I recommend that you not take any of the pain medications like Ibuprofen, Advil, Naproxen or Daypro because they can be ahrmful to your kidneys. When the results are back, you will be notified about the health of your kidneys.

## 2013-04-16 NOTE — Progress Notes (Signed)
S:  This 37 y.o. AA male is here for follow-up eval of abd discomfort. We reviewed abdominal US report which was essentially unremarkable. Pt  c/o "minute" pain around umbilicus. Intermittent occurrence w/ straining bowel movement. Pt feels bloated; this problem has improved some w/ elimination of dairy from diet. PMHx is significant for esophageal stricture requiring a dilatation by Dr. Melvia Heaps. Pt has not been taking PPI as he did after that procedure. He feels like his stomach is "sore" and he c/o early satiety. He has been taking NSAID prn (less than once a day) for foot pain. Pt has a hx of gout but decided against taking medication in March.   Pt feels pressure in mid abdomen when he waits to long to void. He denies nocturia. He has mild decrease of stream if he holds urine to long. There has been no hematuria. He denies painful intercourse. Prostate exam has been checked but more than 2 years ago (normal exam); pt declines exam today.  Patient Active Problem List   Diagnosis Date Noted  . OBESITY, CLASS II, BMI 35- 39.9 03/15/2012  . Hypertriglyceridemia 03/15/2012  . CHEST PAIN UNSPECIFIED 12/02/2008  . DYSPHAGIA UNSPECIFIED 12/02/2008    PMHx, Soc Hx and Fam Hx reviewed.  ROS: As per HPI. Otherwise unremarkable.  O:  Filed Vitals:   04/16/13 1514  BP: 127/80  Pulse: 69  Temp: 98.5 F (36.9 C)  Resp: 18   GEN: In NAD; WN,WD. Pt is obese. HENT: [place]/AT; EOMI w/ clear conj/ sclerae. Otherwise normal. NECK: Supple w/ mild TMG. No LAN. Circumference not measured. COR: RRR. No m/g/r. LUNGS: CTA; no wheezes or rales. ABD: Obese; slight distention with epig tenderness. No guarding or rebound. No HSM or masses. NEURO: A&O x 3; CNs intact. Nonfocal.  A/P: Elevated serum creatinine - Cr= 1.3 in June 2013. Do not take NSIADs. Improve hydration.    Plan: Basic metabolic panel  History of esophageal stricture- Pt to resume Lansoprazole 30 mg daily.  Pain in joint, ankle and foot,  unspecified laterality- Pt to discontinue NSAIDs for foot pain; he is going to the [facility]" for some insoles. RX: Tramadol 50 mg 1 tab every 8 hours prn foot pain.  Meds ordered this encounter  Medications  . DISCONTD: naproxen sodium (ANAPROX) 220 MG tablet    Sig: Take 220 mg by mouth 2 (two) times daily with a meal.  . DISCONTD: oxaprozin (DAYPRO) 600 MG tablet    Sig: Take 600 mg by mouth daily.  . lansoprazole (PREVACID) 30 MG capsule    Sig: Take 1 capsule (30 mg total) by mouth daily.    Dispense:  30 capsule    Refill:  5  . traMADol (ULTRAM) 50 MG tablet    Sig: Take 1 tablet (50 mg total) by mouth every 8 (eight) hours as needed for pain.    Dispense:  60 tablet    Refill:  0

## 2013-04-17 LAB — URIC ACID: Uric Acid, Serum: 7.8 mg/dL (ref 4.0–7.8)

## 2013-04-18 NOTE — Progress Notes (Signed)
Quick Note:  Please notify pt that results are normal.   Provide pt with copy of labs. ______ 

## 2013-04-18 NOTE — Progress Notes (Signed)
Quick Note:  Please contact pt and advise that the following labs are abnormal... Blood test for uric acid is borderline high; maintain adequate hydration and avoid foods and drinks (alcohol) that can cause elevated uric acid levels.  Copy to pt. ______

## 2013-05-05 ENCOUNTER — Ambulatory Visit: Payer: Managed Care, Other (non HMO)

## 2013-05-05 ENCOUNTER — Emergency Department (HOSPITAL_COMMUNITY)
Admission: EM | Admit: 2013-05-05 | Discharge: 2013-05-05 | Disposition: A | Discharge status: Home / Self Care | Payer: Managed Care, Other (non HMO) | Attending: Emergency Medicine | Admitting: Emergency Medicine

## 2013-05-05 ENCOUNTER — Emergency Department (HOSPITAL_COMMUNITY): Payer: Managed Care, Other (non HMO)

## 2013-05-05 ENCOUNTER — Ambulatory Visit (INDEPENDENT_AMBULATORY_CARE_PROVIDER_SITE_OTHER): Payer: Managed Care, Other (non HMO) | Admitting: Internal Medicine

## 2013-05-05 ENCOUNTER — Encounter (HOSPITAL_COMMUNITY): Payer: Self-pay | Admitting: *Deleted

## 2013-05-05 VITALS — BP 134/80 | HR 63 | Temp 98.0°F | Resp 16 | Ht 74.0 in | Wt 298.0 lb

## 2013-05-05 DIAGNOSIS — M542 Cervicalgia: Secondary | ICD-10-CM

## 2013-05-05 DIAGNOSIS — Z8719 Personal history of other diseases of the digestive system: Secondary | ICD-10-CM | POA: Insufficient documentation

## 2013-05-05 DIAGNOSIS — R112 Nausea with vomiting, unspecified: Secondary | ICD-10-CM

## 2013-05-05 DIAGNOSIS — R51 Headache: Secondary | ICD-10-CM | POA: Insufficient documentation

## 2013-05-05 DIAGNOSIS — M25519 Pain in unspecified shoulder: Secondary | ICD-10-CM

## 2013-05-05 DIAGNOSIS — R1013 Epigastric pain: Secondary | ICD-10-CM

## 2013-05-05 DIAGNOSIS — G4710 Hypersomnia, unspecified: Secondary | ICD-10-CM

## 2013-05-05 DIAGNOSIS — R110 Nausea: Secondary | ICD-10-CM | POA: Insufficient documentation

## 2013-05-05 DIAGNOSIS — Z880 Allergy status to penicillin: Secondary | ICD-10-CM | POA: Insufficient documentation

## 2013-05-05 DIAGNOSIS — K859 Acute pancreatitis without necrosis or infection, unspecified: Secondary | ICD-10-CM | POA: Insufficient documentation

## 2013-05-05 DIAGNOSIS — R42 Dizziness and giddiness: Secondary | ICD-10-CM | POA: Insufficient documentation

## 2013-05-05 DIAGNOSIS — Z8711 Personal history of peptic ulcer disease: Secondary | ICD-10-CM | POA: Insufficient documentation

## 2013-05-05 DIAGNOSIS — H9201 Otalgia, right ear: Secondary | ICD-10-CM

## 2013-05-05 DIAGNOSIS — W57XXXA Bitten or stung by nonvenomous insect and other nonvenomous arthropods, initial encounter: Secondary | ICD-10-CM

## 2013-05-05 DIAGNOSIS — H9209 Otalgia, unspecified ear: Secondary | ICD-10-CM | POA: Insufficient documentation

## 2013-05-05 DIAGNOSIS — Z79899 Other long term (current) drug therapy: Secondary | ICD-10-CM | POA: Insufficient documentation

## 2013-05-05 DIAGNOSIS — M2550 Pain in unspecified joint: Secondary | ICD-10-CM

## 2013-05-05 DIAGNOSIS — H6121 Impacted cerumen, right ear: Secondary | ICD-10-CM

## 2013-05-05 DIAGNOSIS — R300 Dysuria: Secondary | ICD-10-CM | POA: Insufficient documentation

## 2013-05-05 LAB — CBC WITH DIFFERENTIAL/PLATELET
Basophils Absolute: 0.1 10*3/uL (ref 0.0–0.1)
Basophils Relative: 1 % (ref 0–1)
Eosinophils Absolute: 0.2 10*3/uL (ref 0.0–0.7)
HCT: 46.6 % (ref 39.0–52.0)
Hemoglobin: 15.9 g/dL (ref 13.0–17.0)
MCH: 30.3 pg (ref 26.0–34.0)
MCHC: 34.1 g/dL (ref 30.0–36.0)
Monocytes Absolute: 0.8 10*3/uL (ref 0.1–1.0)
Monocytes Relative: 10 % (ref 3–12)
Neutrophils Relative %: 56 % (ref 43–77)
RDW: 13.8 % (ref 11.5–15.5)

## 2013-05-05 LAB — URINALYSIS, ROUTINE W REFLEX MICROSCOPIC
Glucose, UA: NEGATIVE mg/dL
Hgb urine dipstick: NEGATIVE
Specific Gravity, Urine: 1.02 (ref 1.005–1.030)
Urobilinogen, UA: 0.2 mg/dL (ref 0.0–1.0)
pH: 7 (ref 5.0–8.0)

## 2013-05-05 LAB — POCT CBC
Hemoglobin: 16.8 g/dL (ref 14.1–18.1)
Lymph, poc: 1.5 (ref 0.6–3.4)
MCH, POC: 30.6 pg (ref 27–31.2)
MCHC: 32.1 g/dL (ref 31.8–35.4)
MID (cbc): 0.5 (ref 0–0.9)
MPV: 9.3 fL (ref 0–99.8)
POC LYMPH PERCENT: 18 %L (ref 10–50)
POC MID %: 6.1 %M (ref 0–12)
RDW, POC: 15.1 %
WBC: 8.6 10*3/uL (ref 4.6–10.2)

## 2013-05-05 LAB — COMPREHENSIVE METABOLIC PANEL
ALT: 16 U/L (ref 0–53)
AST: 18 U/L (ref 0–37)
Calcium: 8.9 mg/dL (ref 8.4–10.5)
Potassium: 3.5 mEq/L (ref 3.5–5.1)
Sodium: 138 mEq/L (ref 135–145)
Total Protein: 7 g/dL (ref 6.0–8.3)

## 2013-05-05 MED ORDER — IOHEXOL 300 MG/ML  SOLN
50.0000 mL | Freq: Once | INTRAMUSCULAR | Status: AC | PRN
  Administered 2013-05-05: 50 mL via ORAL

## 2013-05-05 MED ORDER — SODIUM CHLORIDE 0.9 % IV BOLUS (SEPSIS)
1000.0000 mL | Freq: Once | INTRAVENOUS | Status: AC
  Administered 2013-05-05: 1000 mL via INTRAVENOUS

## 2013-05-05 MED ORDER — HYDROCODONE-ACETAMINOPHEN 5-325 MG PO TABS: 2.0000 | ORAL_TABLET | ORAL | Status: DC | PRN

## 2013-05-05 MED ORDER — GI COCKTAIL ~~LOC~~
30.0000 mL | Freq: Once | ORAL | Status: AC
  Administered 2013-05-05: 30 mL via ORAL
  Filled 2013-05-05: qty 30

## 2013-05-05 MED ORDER — CYCLOBENZAPRINE HCL 10 MG PO TABS: 10.0000 mg | ORAL_TABLET | Freq: Every day | ORAL | Status: DC

## 2013-05-05 MED ORDER — ONDANSETRON HCL 8 MG PO TABS: 8.0000 mg | ORAL_TABLET | Freq: Three times a day (TID) | ORAL | Status: DC | PRN

## 2013-05-05 MED ORDER — HYDROCODONE-ACETAMINOPHEN 7.5-325 MG PO TABS: 1.0000 | ORAL_TABLET | Freq: Four times a day (QID) | ORAL | Status: DC | PRN

## 2013-05-05 MED ORDER — IOHEXOL 300 MG/ML  SOLN
100.0000 mL | Freq: Once | INTRAMUSCULAR | Status: AC | PRN
  Administered 2013-05-05: 100 mL via INTRAVENOUS

## 2013-05-05 NOTE — ED Provider Notes (Signed)
CSN: [id number]     Arrival date & time 05/05/13  0228 History     First MD Initiated Contact with Patient 05/05/13 0259     Chief Complaint  Patient presents with  . Headache  . Dizziness  . Nausea  . Abdominal Pain   (Consider location/radiation/quality/duration/timing/severity/associated sxs/prior Treatment) HPI Comments: Patient presents with multiple complaints including headache, dizziness, nausea, neck pain, right ear pain, epigastric pain, a "pop" in his abdomen when he has a bowel movement, having difficulty starting his urination.  These complaints have been going on for several days.  He has a history of peptic ulcers for which he is on prevacid.  No fevers or chills.  No aggravating or alleviating factors.    The history is provided by the patient.    Past Medical History  Diagnosis Date  . IBS (irritable bowel syndrome)   . Esophagitis    History reviewed. No pertinent past surgical history. History reviewed. No pertinent family history. History  Substance Use Topics  . Smoking status: Never Smoker   . Smokeless tobacco: Not on file  . Alcohol Use: No    Review of Systems  All other systems reviewed and are negative.    Allergies  Caffeine; Omeprazole; Peanuts; and Penicillins  Home Medications   Current Outpatient Rx  Name  Route  Sig  Dispense  Refill  . bismuth subsalicylate (PEPTO BISMOL) 262 MG chewable tablet   Oral   Chew 524 mg by mouth as needed.         Marland Kitchen FLAXSEED, LINSEED, PO   Oral   Take by mouth daily.         . lansoprazole (PREVACID) 30 MG capsule   Oral   Take 1 capsule (30 mg total) by mouth daily.   30 capsule   5   . traMADol (ULTRAM) 50 MG tablet   Oral   Take 1 tablet (50 mg total) by mouth every 8 (eight) hours as needed for pain.   60 tablet   0    BP 141/83  Pulse 68  Temp(Src) 97.9 F (36.6 C) (Oral)  Resp 20  Ht 6\' 2"  (1.88 m)  Wt 297 lb (134.718 kg)  BMI 38.12 kg/m2  SpO2 99% Physical Exam   Nursing note and vitals reviewed. Constitutional: He is oriented to person, place, and time. He appears well-developed and well-nourished. No distress.  HENT:  Head: Normocephalic and atraumatic.  Mouth/Throat: Oropharynx is clear and moist.  Neck: Normal range of motion. Neck supple.  Cardiovascular: Normal rate, regular rhythm and normal heart sounds.   No murmur heard. Pulmonary/Chest: Breath sounds normal. No respiratory distress. He has no wheezes.  Abdominal: Soft. Bowel sounds are normal. He exhibits no distension and no mass. There is tenderness. There is no rebound and no guarding.  There is ttp in the epigastric region without rebound or guarding.    Musculoskeletal: Normal range of motion. He exhibits no edema.  Lymphadenopathy:    He has no cervical adenopathy.  Neurological: He is alert and oriented to person, place, and time.  Skin: Skin is warm and dry. He is not diaphoretic.    ED Course   Procedures (including critical care time)  Labs Reviewed  CBC WITH DIFFERENTIAL  COMPREHENSIVE METABOLIC PANEL  LIPASE, BLOOD  URINALYSIS, ROUTINE W REFLEX MICROSCOPIC   No results found. No diagnosis found.  MDM  The workup reveals only a mildly elevated lipase but no findings of cholecystitis or pancreatitis on the  ct scan.  He is feeling better with meds in the ED.  I believe he is stable for discharge to home.  He will be advised to follow a clear liquid diet and should follow up with his pcp this week to discuss a lipid profile.  He tells me he has had elevated triglycerides in the past and this may well be the cause of his pancreatitis as there are no gallstones and he does not drink alcohol.  Geoffery Lyons, MD 05/05/13 [phone]

## 2013-05-05 NOTE — ED Notes (Signed)
Pt c/o headache all day with some dizziness and nausea. Pt also c/o abdominal pain from where he lifted heavy.
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
## 2013-05-07 ENCOUNTER — Encounter: Payer: Self-pay | Admitting: Internal Medicine

## 2013-06-13 ENCOUNTER — Institutional Professional Consult (permissible substitution): Payer: Managed Care, Other (non HMO) | Admitting: Neurology

## 2013-06-20 ENCOUNTER — Encounter: Payer: Self-pay | Admitting: Neurology

## 2013-06-20 ENCOUNTER — Institutional Professional Consult (permissible substitution): Payer: Managed Care, Other (non HMO) | Admitting: Neurology

## 2013-06-20 ENCOUNTER — Ambulatory Visit (INDEPENDENT_AMBULATORY_CARE_PROVIDER_SITE_OTHER): Payer: Managed Care, Other (non HMO) | Admitting: Neurology

## 2013-06-20 VITALS — BP 118/81 | HR 64 | Resp 16 | Ht 75.0 in | Wt 296.0 lb

## 2013-06-20 DIAGNOSIS — E669 Obesity, unspecified: Secondary | ICD-10-CM

## 2013-06-20 DIAGNOSIS — R0609 Other forms of dyspnea: Secondary | ICD-10-CM

## 2013-06-20 DIAGNOSIS — G4726 Circadian rhythm sleep disorder, shift work type: Secondary | ICD-10-CM

## 2013-06-20 DIAGNOSIS — G4710 Hypersomnia, unspecified: Secondary | ICD-10-CM

## 2013-06-20 DIAGNOSIS — R0683 Snoring: Secondary | ICD-10-CM

## 2013-06-20 HISTORY — DX: Circadian rhythm sleep disorder, shift work type: G47.26

## 2013-06-20 HISTORY — DX: Snoring: R06.83

## 2013-06-20 NOTE — Patient Instructions (Signed)
Polysomnography (Sleep Studies) Polysomnography (PSG) is a series of tests used for detecting (diagnosing) obstructive sleep apnea and other sleep disorders. The tests measure how some parts of your body are working while you are sleeping. The tests are extensive and expensive. They are done in a sleep lab or hospital, and vary from center to center. Your caregiver may perform other more simple sleep studies and questionnaires before doing more complete and involved testing. Testing may not be covered by insurance. Some of these tests are:  An EEG (Electroencephalogram). This tests your brain waves and stages of sleep.  An EOG (Electrooculogram). This measures the movements of your eyes. It detects periods of REM (rapid eye movement) sleep, which is your dream sleep.  An EKG (Electrocardiogram). This measures your heart rhythm.  EMG (Electromyography). This is a measurement of how the muscles are working in your upper airway and your legs while sleeping.  An oximetry measurement. It measures how much oxygen (air) you are getting while sleeping.  Breathing efforts may be measured. The same test can be interpreted (understood) differently by different caregivers and centers that study sleep.  Studies may be given an apnea/hypopnea index (AHI). This is a number which is found by counting the times of no breathing or under breathing during the night, and relating those numbers to the amount of time spent in bed. When the AHI is greater than 15, the patient is likely to complain of daytime sleepiness. When the AHI is greater than 30, the patient is at increased risk for heart problems and must be followed more closely. Following the AHI also allows you to know how treatment is working. Simple oximetry (tracking the amount of oxygen that is taken in) can be used for screening patients who:  Do not have symptoms (problems) of OSA.  Have a normal Epworth Sleepiness Scale Score.  Have a low pre-test  probability of having OSA.  Have none of the upper airway problems likely to cause apnea.  Oximetry is also used to determine if treatment is effective in patients who showed significant desaturations (not getting enough oxygen) on their home sleep study. One extra measure of safety is to perform additional studies for the person who only snores. This is because no one can predict with absolute certainty who will have OSA. Those who show significant desaturations (not getting enough oxygen) are recommended to have a more detailed sleep study. Document Released: 04/02/2003 Document Revised: 12/19/2011 Document Reviewed: 09/26/2005 [facility] Patient Information 2014 [place], [place]. CPAP and BIPAP CPAP and BIPAP are methods of helping you breathe. CPAP stands for "continuous positive airway pressure." BIPAP stands for "bi-level positive airway pressure." Both CPAP and BIPAP are provided by a small machine with a flexible plastic tube that attaches to a plastic mask that goes over your nose or mouth. Air is blown into your air passages through your nose or mouth. This helps to keep your airways open and helps to keep you breathing well. The amount of pressure that is used to blow the air into your air passages can be set on the machine. The pressure setting is based on your needs. With CPAP, the amount of pressure stays the same while you breathe in and out. With BIPAP, the amount of pressure changes when you inhale and exhale. Your caregiver will recommend whether CPAP or BIPAP would be more helpful for you.  CPAP and BIPAP can be helpful for both adults and children with:  Sleep apnea.  Chronic Obstructive Pulmonary Disease (  COPD), a condition like emphysema.  Diseases which weaken the muscles of the chest such as muscular dystrophy or neurological diseases.  Other problems that cause breathing to be weak or difficult. USE OF CPAP OR BIPAP The respiratory therapist or technician will help you get  used to wearing the mask. Some people feel claustrophobic (a trapped or closed in feeling) at first, because the mask needs to be fairly snug on your face.   It may help you to get used to the mask gradually, by first holding the mask loosely over your nose or mouth using a low pressure setting on the machine. Gradually the mask can be applied more snugly with increased pressure. You can also gradually increase the amount of time the mask is used.  People with sleep apnea will use the mask and machine at night when they are sleeping. Others, like those with ALS or other breathing difficulties, may need the CPAP or BIPAP all the time.  If the first mask you try does not fit well, or is uncomfortable, there are other types and sizes that can be tried.  If you tend to breathe through your mouth, a chin strap may be applied to help keep your mouth closed (if you are using a nasal mask).  The CPAP and BIPAP machines have alarms that may sound if the mask comes off or develops a leak.  You should not eat or drink while the CPAP or BIPAP is on. Food or fluids could get pushed into your lungs by the pressure of the CPAP or BIPAP. Sometimes CPAP or BIPAP machines are ordered for home use. If you are going to use the CPAP or BIPAP machine at home, follow these instructions  CPAP or BIPAP machines can be rented or purchased through home health care companies. There are many different brands of machines available. If you rent a machine before purchasing you may find which particular machine works well for you.  Ask questions if there is something you do not understand when picking out your machine.  Place your CPAP or BIPAP machine on a secure table or stand near an electrical outlet.  Know where the On/Off switch is.  Follow your doctor's instructions for how to set the pressure on your machine and when you should use it.  Do not smoke! Tobacco smoke residue can damage the machine. SEEK IMMEDIATE  MEDICAL CARE IF:   You have redness or open areas around your nose or mouth.  You have trouble operating the CPAP or BIPAP machine.  You cannot tolerate wearing the CPAP or BIPAP mask.  You have any questions or concerns. Document Released: 06/24/2004 Document Revised: 12/19/2011 Document Reviewed: 09/23/2008 [facility] Patient Information 2014 [place], [place]. Sleep Apnea  Sleep apnea is a sleep disorder characterized by abnormal pauses in breathing while you sleep. When your breathing pauses, the level of oxygen in your blood decreases. This causes you to move out of deep sleep and into light sleep. As a result, your quality of sleep is poor, and the system that carries your blood throughout your body (cardiovascular system) experiences stress. If sleep apnea remains untreated, the following conditions can develop:  High blood pressure (hypertension).  Coronary artery disease.  Inability to achieve or maintain an erection (impotence).  Impairment of your thought process (cognitive dysfunction). There are three types of sleep apnea: 1. Obstructive sleep apnea Pauses in breathing during sleep because of a blocked airway. 2. Central sleep apnea Pauses in breathing during sleep because  the area of the brain that controls your breathing does not send the correct signals to the muscles that control breathing. 3. Mixed sleep apnea A combination of both obstructive and central sleep apnea. RISK FACTORS The following risk factors can increase your risk of developing sleep apnea:  Being overweight.  Smoking.  Having narrow passages in your nose and throat.  Being of older age.  Being male.  Alcohol use.  Sedative and tranquilizer use.  Ethnicity. Among individuals younger than 35 years, African Americans are at increased risk of sleep apnea. SYMPTOMS   Difficulty staying asleep.  Daytime sleepiness and fatigue.  Loss of energy.  Irritability.  Loud, heavy  snoring.  Morning headaches.  Trouble concentrating.  Forgetfulness.  Decreased interest in sex. DIAGNOSIS  In order to diagnose sleep apnea, your caregiver will perform a physical examination. Your caregiver may suggest that you take a home sleep test. Your caregiver may also recommend that you spend the night in a sleep lab. In the sleep lab, several monitors record information about your heart, lungs, and brain while you sleep. Your leg and arm movements and blood oxygen level are also recorded. TREATMENT The following actions may help to resolve mild sleep apnea:  Sleeping on your side.   Using a decongestant if you have nasal congestion.   Avoiding the use of depressants, including alcohol, sedatives, and narcotics.   Losing weight and modifying your diet if you are overweight. There also are devices and treatments to help open your airway:  Oral appliances. These are custom-made mouthpieces that shift your lower jaw forward and slightly open your bite. This opens your airway.  Devices that create positive airway pressure. This positive pressure "splints" your airway open to help you breathe better during sleep. The following devices create positive airway pressure:  Continuous positive airway pressure (CPAP) device. The CPAP device creates a continuous level of air pressure with an air pump. The air is delivered to your airway through a mask while you sleep. This continuous pressure keeps your airway open.  Nasal expiratory positive airway pressure (EPAP) device. The EPAP device creates positive air pressure as you exhale. The device consists of single-use valves, which are inserted into each nostril and held in place by adhesive. The valves create very little resistance when you inhale but create much more resistance when you exhale. That increased resistance creates the positive airway pressure. This positive pressure while you exhale keeps your airway open, making it easier  to breath when you inhale again.  Bilevel positive airway pressure (BPAP) device. The BPAP device is used mainly in patients with central sleep apnea. This device is similar to the CPAP device because it also uses an air pump to deliver continuous air pressure through a mask. However, with the BPAP machine, the pressure is set at two different levels. The pressure when you exhale is lower than the pressure when you inhale.  Surgery. Typically, surgery is only done if you cannot comply with less invasive treatments or if the less invasive treatments do not improve your condition. Surgery involves removing excess tissue in your airway to create a wider passage way. Document Released: 09/16/2002 Document Revised: 03/27/2012 Document Reviewed: 02/02/2012 [facility] Patient Information 2014 [place], [place].
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
## 2013-07-03 ENCOUNTER — Telehealth: Payer: Self-pay | Admitting: *Deleted

## 2013-07-03 DIAGNOSIS — R0683 Snoring: Secondary | ICD-10-CM

## 2013-07-03 DIAGNOSIS — G4710 Hypersomnia, unspecified: Secondary | ICD-10-CM

## 2013-07-03 DIAGNOSIS — G4726 Circadian rhythm sleep disorder, shift work type: Secondary | ICD-10-CM

## 2013-07-03 NOTE — Telephone Encounter (Signed)
Called and LM for patient regarding HST setup.  Explained that Rosann Auerbach would not authorize in lab study but did approve HST at 100%.  Explained he and I would need to meet in the office for 15 minutes for instruction on how to use the home sleep test unit.  Asked him to return my call to arrange this time.  Left possible dates that would be good to schedule as Friday 9/26 any time, and Sunday evening 07/07/13.

## 2013-07-09 NOTE — Telephone Encounter (Signed)
Called and scheduled HST appt for Thursday AM at 10:00.  An appointment was entered on my schedule for HST instruction and setup. -sh

## 2013-07-10 ENCOUNTER — Ambulatory Visit (INDEPENDENT_AMBULATORY_CARE_PROVIDER_SITE_OTHER): Payer: Managed Care, Other (non HMO) | Admitting: *Deleted

## 2013-07-10 ENCOUNTER — Encounter: Payer: Self-pay | Admitting: *Deleted

## 2013-07-10 VITALS — HR 71

## 2013-07-10 DIAGNOSIS — G4713 Recurrent hypersomnia: Secondary | ICD-10-CM

## 2013-07-10 DIAGNOSIS — G4726 Circadian rhythm sleep disorder, shift work type: Secondary | ICD-10-CM

## 2013-07-10 DIAGNOSIS — G4733 Obstructive sleep apnea (adult) (pediatric): Secondary | ICD-10-CM

## 2013-07-10 DIAGNOSIS — G4736 Sleep related hypoventilation in conditions classified elsewhere: Secondary | ICD-10-CM

## 2013-07-10 NOTE — Sleep Study (Signed)
Patient arrives for HST instruction.  Patient is given written instructions, picture instructions, and a demonstration on how to use HST unit.  All questions / concerns were addressed by technologist.  Financial responsibility was explained.  Follow up information was given to patient regarding how results would be received. Rosann Auerbach will pay 100% for HST.  We discussed auto-titration if needed and central apneas with in lab study as well.  We also discussed treatment for EDS.

## 2013-07-11 ENCOUNTER — Encounter: Payer: Managed Care, Other (non HMO) | Admitting: *Deleted

## 2013-07-16 ENCOUNTER — Ambulatory Visit: Payer: Managed Care, Other (non HMO) | Admitting: Family Medicine

## 2013-07-16 NOTE — Sleep Study (Signed)
See media tab for full report  

## 2013-07-19 ENCOUNTER — Telehealth: Payer: Self-pay | Admitting: *Deleted

## 2013-07-19 DIAGNOSIS — G4726 Circadian rhythm sleep disorder, shift work type: Secondary | ICD-10-CM

## 2013-07-19 DIAGNOSIS — G4761 Periodic limb movement disorder: Secondary | ICD-10-CM

## 2013-07-19 DIAGNOSIS — G4713 Recurrent hypersomnia: Secondary | ICD-10-CM

## 2013-07-19 NOTE — Telephone Encounter (Signed)
Called patient to discuss sleep study results.  Discussed findings, recommendations and follow up care.  Patient understood well and all questions were answered.   Explained that HST did not document the reason for this patient's high level of daytime sleepiness.  Also discussed that Dr. Vickey Huger documented concern for the variation in HR and the low oxygen level.  She suspects this test may not be telling us 1. How severe his sleep apnea is and she wonders if it may be more significant, or 2. that the patient could have some other sleep issue such as Narcolepsy, or PLMD that needs to be ruled out to explain his EDS.    Dr. Vickey Huger has ordered NPSG to be followed by MSLT to evaluate for Narcolepsy.  We will obtain auth for these procedures.  Dr. Vickey Huger states that if the patient shows a significant level of OSA during his in lab study, it is okay to Split the study and cancel the scheduled MSLT - we will bill for [id number] only rather than [phone] if necessary.  Patient understands and agrees.  We discussed that EDS this severe generally requires treatment and the MSLT will help him get that treatment covered. Patient understands we will start this process of authorization and he will hear from [place] by Friday of next week if we have received the auth.  A copy of the report was sent to patient and also to Dr. Merla Riches.

## 2013-08-07 ENCOUNTER — Ambulatory Visit: Payer: Managed Care, Other (non HMO) | Admitting: Family Medicine

## 2013-08-15 ENCOUNTER — Other Ambulatory Visit: Payer: Self-pay

## 2013-08-22 ENCOUNTER — Ambulatory Visit (INDEPENDENT_AMBULATORY_CARE_PROVIDER_SITE_OTHER): Payer: Managed Care, Other (non HMO)

## 2013-08-22 DIAGNOSIS — G4713 Recurrent hypersomnia: Secondary | ICD-10-CM

## 2013-08-22 DIAGNOSIS — G4726 Circadian rhythm sleep disorder, shift work type: Secondary | ICD-10-CM

## 2013-08-22 DIAGNOSIS — G4733 Obstructive sleep apnea (adult) (pediatric): Secondary | ICD-10-CM

## 2013-08-28 ENCOUNTER — Ambulatory Visit: Payer: Managed Care, Other (non HMO) | Admitting: Family Medicine

## 2013-09-03 ENCOUNTER — Other Ambulatory Visit: Payer: Self-pay | Admitting: *Deleted

## 2013-09-03 DIAGNOSIS — G4726 Circadian rhythm sleep disorder, shift work type: Secondary | ICD-10-CM

## 2013-09-03 DIAGNOSIS — G4710 Hypersomnia, unspecified: Secondary | ICD-10-CM

## 2013-09-03 DIAGNOSIS — R0683 Snoring: Secondary | ICD-10-CM

## 2013-09-09 ENCOUNTER — Encounter: Payer: Self-pay | Admitting: *Deleted

## 2013-09-09 ENCOUNTER — Telehealth: Payer: Self-pay | Admitting: Neurology

## 2013-09-09 DIAGNOSIS — G4733 Obstructive sleep apnea (adult) (pediatric): Secondary | ICD-10-CM

## 2013-09-09 NOTE — Telephone Encounter (Signed)
I called and left a message for the patient that Dr. Vickey Huger would like for him to start CPAP therapy at home and that I will fax the order to DME company [facility] and they will contact him about setting up an appointment and I will mail him a copy of the report along with a follow up letter. I will fax Dr. Merla Riches office's a copy of the report.

## 2013-09-19 ENCOUNTER — Ambulatory Visit (INDEPENDENT_AMBULATORY_CARE_PROVIDER_SITE_OTHER): Payer: Managed Care, Other (non HMO) | Admitting: Family Medicine

## 2013-09-19 ENCOUNTER — Encounter: Payer: Self-pay | Admitting: Family Medicine

## 2013-09-19 VITALS — BP 114/80 | HR 69 | Temp 98.2°F | Resp 16 | Ht 74.5 in | Wt 294.6 lb

## 2013-09-19 DIAGNOSIS — G4733 Obstructive sleep apnea (adult) (pediatric): Secondary | ICD-10-CM

## 2013-09-19 DIAGNOSIS — E669 Obesity, unspecified: Secondary | ICD-10-CM

## 2013-09-19 DIAGNOSIS — E786 Lipoprotein deficiency: Secondary | ICD-10-CM

## 2013-09-19 DIAGNOSIS — E559 Vitamin D deficiency, unspecified: Secondary | ICD-10-CM

## 2013-09-19 NOTE — Patient Instructions (Signed)
Vitamin D Deficiency Vitamin D is an important vitamin that your body needs. Having too little of it in your body is called a deficiency. A very bad deficiency can make your bones soft and can cause a condition called rickets.  Vitamin D is important to your body for different reasons, such as:   It helps your body absorb 2 minerals called calcium and phosphorus.  It helps make your bones healthy.  It may prevent some diseases, such as diabetes and multiple sclerosis.  It helps your muscles and heart. You can get vitamin D in several ways. It is a natural part of some foods. The vitamin is also added to some dairy products and cereals. Some people take vitamin D supplements. Also, your body makes vitamin D when you are in the sun. It changes the sun's rays into a form of the vitamin that your body can use. CAUSES   Not eating enough foods that contain vitamin D.  Not getting enough sunlight.  Having certain digestive system diseases that make it hard to absorb vitamin D. These diseases include Crohn's disease, chronic pancreatitis, and cystic fibrosis.  Having a surgery in which part of the stomach or small intestine is removed.  Being obese. Fat cells pull vitamin D out of your blood. That means that obese people may not have enough vitamin D left in their blood and in other body tissues.  Having chronic kidney or liver disease. RISK FACTORS Risk factors are things that make you more likely to develop a vitamin D deficiency. They include:  Being older.  Not being able to get outside very much.  Living in a nursing home.  Having had broken bones.  Having weak or thin bones (osteoporosis).  Having a disease or condition that changes how your body absorbs vitamin D.  Having dark skin.  Some medicines such as seizure medicines or steroids.  Being overweight or obese. SYMPTOMS Mild cases of vitamin D deficiency may not have any symptoms. If you have a very bad case, symptoms  may include:  Bone pain.  Muscle pain.  Falling often.  Broken bones caused by a minor injury, due to osteoporosis. DIAGNOSIS A blood test is the best way to tell if you have a vitamin D deficiency. TREATMENT Vitamin D deficiency can be treated in different ways. Treatment for vitamin D deficiency depends on what is causing it. Options include:  Taking vitamin D supplements.  Taking a calcium supplement. Your caregiver will suggest what dose is best for you. HOME CARE INSTRUCTIONS  Take any supplements that your caregiver prescribes. Follow the directions carefully. Take only the suggested amount.  Have your blood tested 2 months after you start taking supplements.  Eat foods that contain vitamin D. Healthy choices include:  Fortified dairy products, cereals, or juices. Fortified means vitamin D has been added to the food. Check the label on the package to be sure.  Fatty fish like salmon or trout.  Mushrooms.  Eggs.  Oysters.  Do not use a tanning bed.  Keep your weight at a healthy level. Lose weight if you need to.  Keep all follow-up appointments. Your caregiver will need to perform blood tests to make sure your vitamin D deficiency is going away. SEEK MEDICAL CARE IF:  You have any questions about your treatment.  You continue to have symptoms of vitamin D deficiency.  You have nausea or vomiting.  You are constipated.  You feel confused.  You have severe abdominal or back  pain. MAKE SURE YOU:  Understand these instructions.  Will watch your condition.  Will get help right away if you are not doing well or get worse. Document Released: 12/19/2011 Document Revised: 01/21/2013 Document Reviewed: 12/19/2011 [facility] Patient Information 2014 [place], [place].    Get an OTC Vitamin D supplement 2000 IU per capsule and take it daiyl

## 2013-09-22 DIAGNOSIS — E559 Vitamin D deficiency, unspecified: Secondary | ICD-10-CM | POA: Insufficient documentation

## 2013-09-22 NOTE — Progress Notes (Signed)
S:  This 37 y.o. AA male has mild OSA, evaluated by [facility] ([facility]). He presents w/ copy of sleep study performed 08/22/13 and interpreted by Dr. Vickey Huger; her office has ordered CPAP. Pt to follow-up w/ [facility] after 14-day home trial. Pt wants to discuss different recommendations made in the report and requests referral to ENT. He c/o mild throat discomfort and burning c/w mild reflux. He has used OTC medication in the past and Prevacid was prescribed earlier this year and used for brief period. Pt denies dysphagia or hoarseness. He does not describe regurgitation or persistent abd pain or change in bowel habits. Pt last had GI evaluation in 2010 (Drs. Arlyce Dice and Marina Goodell at [facility]).  Pt recognizes that obesity is contributing to OSA as well as possible reflux. He has initiated weight loss plan and has lost > 10 lbs in last 12 months. Earlier this year, pt was diagnosed w/ Vit D def w/ Level = 15. He is not taking a supplement and was advised about importance of this vitamin as it related to obesity and weight loss. Pt was advised in past to get OTC 2000 IU and take it daily; he reported intolerance of that high dose so advised to get lower dose that he could tolerate. He has not complied w/ this recommendation.  Dyslipidemia- discussed low HDL with pt and advised of increased cardiac risk due to this problem along w/ obesity and OSA. Pt is taking Flaxseed most days of the week. He is encouraged to commit to weight loss and improved nutrition and well as a fitness program.  Patient Active Problem List   Diagnosis Date Noted  . Unspecified vitamin D deficiency 09/22/2013  . Shifting sleep-work schedule, affecting sleep 06/20/2013  . Snoring disorder 06/20/2013  . Hypersomnia, persistent   . History of esophageal stricture 04/16/2013  . Pain in joint, ankle and foot 04/16/2013  . Gout 04/16/2013  . OBESITY, CLASS II, BMI 35- 39.9 03/15/2012  . Hypertriglyceridemia 03/15/2012  . CHEST  PAIN UNSPECIFIED 12/02/2008  . DYSPHAGIA UNSPECIFIED 12/02/2008   PMHx, Surg Hx, Soc and Fam Hx reviewed.  Medications reconciled.  ROS: AS per HPI.  O: Filed Vitals:   09/19/13 1147  BP: 114/80  Pulse: 69  Temp: 98.2 F (36.8 C)  Resp: 16   GEN: In NAD: WN,WD. Pt is obese. HENT: /AT; EOMI w/ clear conj/sclerae. Post ph w/ mild erythema but no enlarged tonsils or adenoids visible; no lesions or exudate. NECK: No TMG or LAN; neck circumference = 19 inches. COR: RRR. LUNGS: CTA; no wheezes or rhonchi; BS distant at bases. NEURO: A&O x 3; CNs intact. Nonfocal.  A/P: Mild obstructive sleep apnea - Discussed some positional strategies that pt could employ since his OSA is "positional dependent". Pt advised that he could sew some tennis balls into back of T-shirt to prevent him from sleeping on his back; this has been partially effective for pts .  Plan: Ambulatory referral to ENT  Unspecified vitamin D deficiency- Again, recommended OTC Vit D 500- 1000 IU and daily administration.  OBESITY, CLASS II, BMI 35- 39.9- Encourage pt to continue weight loss program as this is key to better health overall and reduction of OSA symptoms.  Low HDL (under 40)- Continue Flaxseed supplement; may also add Omega-3 Fish Oil 2000 mg daily. Will recheck lipid panel at next visit.

## 2013-11-14 ENCOUNTER — Telehealth: Payer: Self-pay | Admitting: Neurology

## 2013-11-14 NOTE — Telephone Encounter (Signed)
Patient would like to make a follow up CPAP machine follow up. Please call to advise.

## 2013-11-22 ENCOUNTER — Encounter: Payer: Self-pay | Admitting: Neurology

## 2013-11-22 NOTE — Telephone Encounter (Signed)
Called pt and scheduled post CPAP follow up appt.

## 2013-11-27 ENCOUNTER — Encounter: Payer: Self-pay | Admitting: Neurology

## 2013-12-03 ENCOUNTER — Encounter: Payer: Self-pay | Admitting: Neurology

## 2013-12-16 ENCOUNTER — Encounter: Payer: Self-pay | Admitting: Neurology

## 2014-01-03 ENCOUNTER — Ambulatory Visit: Payer: Managed Care, Other (non HMO) | Admitting: Neurology

## 2014-01-16 ENCOUNTER — Encounter: Payer: Managed Care, Other (non HMO) | Admitting: Family Medicine

## 2014-01-28 ENCOUNTER — Telehealth: Payer: Self-pay | Admitting: Neurology

## 2014-01-28 NOTE — Telephone Encounter (Signed)
Gilda Crease Larey Seat, MD Cc: Gully  I wanted to send an update on this patient after Katharine Look has spoken with him. Please see below:   Called today, gave the patient some encouragement to continue use.  Suggestions given: increase temp from 73 to 75 and humidity from 3.5 up to 4.5.  He also advised to change his thinking. Patient says he is often running from his dad's to his girlfriend's home.  I discussed ways for him to improve his time on the machine such as daytime or evening time desense trials.   He will try these suggestion over the next week and I will check back in about 5-7 days.    Thank you!  [employer]

## 2014-02-06 ENCOUNTER — Encounter: Payer: Managed Care, Other (non HMO) | Admitting: Family Medicine

## 2014-03-21 ENCOUNTER — Encounter: Payer: Self-pay | Admitting: Family Medicine

## 2014-03-21 ENCOUNTER — Ambulatory Visit (INDEPENDENT_AMBULATORY_CARE_PROVIDER_SITE_OTHER): Payer: Managed Care, Other (non HMO) | Admitting: Family Medicine

## 2014-03-21 VITALS — BP 109/70 | HR 62 | Temp 98.1°F | Resp 16 | Ht 74.5 in | Wt 303.2 lb

## 2014-03-21 DIAGNOSIS — F411 Generalized anxiety disorder: Secondary | ICD-10-CM

## 2014-03-21 MED ORDER — CITALOPRAM HYDROBROMIDE 20 MG PO TABS: 20.0000 mg | ORAL_TABLET | Freq: Every day | ORAL | Status: DC

## 2014-03-21 NOTE — Patient Instructions (Signed)
Generalized Anxiety Disorder Generalized anxiety disorder (GAD) is a mental disorder. It interferes with life functions, including relationships, work, and school. GAD is different from normal anxiety, which everyone experiences at some point in their lives in response to specific life events and activities. Normal anxiety actually helps [place] prepare for and get through these life events and activities. Normal anxiety goes away after the event or activity is over.  GAD causes anxiety that is not necessarily related to specific events or activities. It also causes excess anxiety in proportion to specific events or activities. The anxiety associated with GAD is also difficult to control. GAD can vary from mild to severe. People with severe GAD can have intense waves of anxiety with physical symptoms (panic attacks).  SYMPTOMS The anxiety and worry associated with GAD are difficult to control. This anxiety and worry are related to many life events and activities and also occur more days than not for 6 months or longer. People with GAD also have three or more of the following symptoms (one or more in children):  Restlessness.   Fatigue.  Difficulty concentrating.   Irritability.  Muscle tension.  Difficulty sleeping or unsatisfying sleep. DIAGNOSIS GAD is diagnosed through an assessment by your caregiver. Your caregiver will ask you questions aboutyour mood,physical symptoms, and events in your life. Your caregiver may ask you about your medical history and use of alcohol or drugs, including prescription medications. Your caregiver may also do a physical exam and blood tests. Certain medical conditions and the use of certain substances can cause symptoms similar to those associated with GAD. Your caregiver may refer you to a mental health specialist for further evaluation. TREATMENT The following therapies are usually used to treat GAD:   Medication Antidepressant medication usually is  prescribed for long-term daily control. Antianxiety medications may be added in severe cases, especially when panic attacks occur.   Talk therapy (psychotherapy) Certain types of talk therapy can be helpful in treating GAD by providing support, education, and guidance. A form of talk therapy called cognitive behavioral therapy can teach you healthy ways to think about and react to daily life events and activities.  Stress managementtechniques These include yoga, meditation, and exercise and can be very helpful when they are practiced regularly. A mental health specialist can help determine which treatment is best for you. Some people see improvement with one therapy. However, other people require a combination of therapies. Document Released: 01/21/2013 Document Reviewed: 01/21/2013 [facility] Patient Information 2014 [place], [place].    Panic Attacks Panic attacks are sudden, short feelings of great fear or discomfort. You may have them for no reason when you are relaxed, when you are uneasy (anxious), or when you are sleeping.  HOME CARE  Take all your medicines as told.  Check with your doctor before starting new medicines.  Keep all doctor visits. GET HELP IF:  You are not able to take your medicines as told.  Your symptoms do not get better.  Your symptoms get worse. GET HELP RIGHT AWAY IF:  Your attacks seem different than your normal attacks.  You have thoughts about hurting yourself or others.  You take panic attack medicine and you have a side effect. MAKE SURE YOU:  Understand these instructions.  Will watch your condition.  Will get help right away if you are not doing well or get worse. Document Released: 10/29/2010 Document Revised: 07/17/2013 Document Reviewed: 05/10/2013 [facility] Patient Information 2014 [place], [place].

## 2014-03-23 ENCOUNTER — Encounter: Payer: Self-pay | Admitting: Family Medicine

## 2014-03-23 NOTE — Progress Notes (Signed)
Subjective:    Patient ID: Francis Lin, male    DOB: 27-Dec-1975, 38 y.o.   MRN: [id number]  Anxiety Symptoms include dizziness, palpitations and shortness of breath. Patient reports no chest pain.      This 38 y.o. AA male is here for evaluation of palpitations associated w/ SOB and dizziness. He thinks he has been having panic attacks. Pt reports several significant life changes in the last 2-3 months. He is going through a divorce and admits some poor decisions in his personal life that are impacting him financially as well. He has been short-tempered at work and more irritable. He has been going to the fitness center and notes feeling lightheaded and off- balance while sitting in the car after workouts. He admits inadequate hydration. He has experienced sudden onset of SOB and rapid heart beat while at rest. He sought counseling; he was advised of strategies to deal with unpleasant thoughts and anxiety. He is concerned that people may think he "is crazy".  Pt does not use illicit substances, does not smoke or consume alcohol.  Patient Active Problem List   Diagnosis Date Noted  . Unspecified vitamin D deficiency 09/22/2013  . Shifting sleep-work schedule, affecting sleep 06/20/2013  . Snoring disorder 06/20/2013  . Hypersomnia, persistent   . History of esophageal stricture 04/16/2013  . Pain in joint, ankle and foot 04/16/2013  . Gout 04/16/2013  . OBESITY, CLASS II, BMI 35- 39.9 03/15/2012  . Hypertriglyceridemia 03/15/2012  . CHEST PAIN UNSPECIFIED 12/02/2008  . DYSPHAGIA UNSPECIFIED 12/02/2008    Prior to Admission medications   Medication Sig Start Date End Date Taking? Authorizing Provider  Acetaminophen (TYLENOL PO) Take by mouth once a week. Once weekly   Yes Historical Provider, MD  bismuth subsalicylate (PEPTO BISMOL) 262 MG chewable tablet Chew 524 mg by mouth as needed.   Yes Historical Provider, MD  Naproxen Sodium (ALEVE PO) Take by mouth once a week. Once weekly    Yes Historical Provider, MD    PMHx, Surg Hx, Soc and Fam Hx reviewed.  Review of Systems  Constitutional: Negative.   Eyes: Negative for visual disturbance.  Respiratory: Positive for shortness of breath. Negative for cough, choking, chest tightness and wheezing.   Cardiovascular: Positive for palpitations. Negative for chest pain.  Gastrointestinal: Negative.   Endocrine: Negative.   Musculoskeletal: Negative.   Neurological: Positive for dizziness and light-headedness.  Psychiatric/Behavioral: Negative.       Objective:   Physical Exam  Nursing note and vitals reviewed. Constitutional: He is oriented to person, place, and time. He appears well-developed and well-nourished. No distress.  BMI= 38.41 kg/m2  HENT:  Head: Normocephalic and atraumatic.  Right Ear: External ear normal.  Left Ear: External ear normal.  Nose: Nose normal.  Mouth/Throat: Oropharynx is clear and moist.  Eyes: Conjunctivae are normal. Pupils are equal, round, and reactive to light. No scleral icterus.  Neck: Normal range of motion. Neck supple.  Cardiovascular: Normal rate and regular rhythm.   Pulmonary/Chest: Effort normal and breath sounds normal. No respiratory distress.  Musculoskeletal: Normal range of motion. He exhibits no edema.  Neurological: He is alert and oriented to person, place, and time. No cranial nerve deficit. He exhibits normal muscle tone. Coordination normal.  Skin: Skin is warm and dry. No rash noted. He is not diaphoretic. No erythema.  Psychiatric: He has a normal mood and affect. His behavior is normal. Judgment and thought content normal.      Assessment & Plan:  GAD (generalized anxiety disorder)- Pt is having significant anxiety and panic attacks associated with multiple adverse life changes/ crises. He agrees to continue counseling and to daily medication to stabilize his mood and reduce anxiety and irritability.  Meds ordered this encounter  Medications  . citalopram  (CELEXA) 20 MG tablet    Sig: Take 1 tablet (20 mg total) by mouth daily.    Dispense:  30 tablet    Refill:  3   Face-to-face time spent with pt: 25-30 minutes.

## 2014-04-10 ENCOUNTER — Telehealth: Payer: Self-pay | Admitting: *Deleted

## 2014-04-10 NOTE — Telephone Encounter (Signed)
Pt stopped by the office with concerns that the celexa is causing tremors and severe fatigue. Pt states that he has not been consistant in taking the medication. He has never tried anything else but would like to try something different in hopes the he does not have this side effect again.

## 2014-04-11 ENCOUNTER — Encounter: Payer: Managed Care, Other (non HMO) | Admitting: Family Medicine

## 2014-04-11 NOTE — Telephone Encounter (Signed)
Phoned pt to discuss jitteriness and sluggishness which he experienced after taking 1/2 tablet for ~ 2 weeks. He reports waking up felling jittery but this subsides after he gets to work and has breakfast. He had a sleep study in Nov 2014 but not clear if he has OSA. ENT evaluation found no obstruction. Study does note loud snoring. Pt agrees to try medication (citalopram) again 1/2 tab= 10 mg and report effects at f/u on 04/25/14 (CPE appt).

## 2014-04-25 ENCOUNTER — Encounter: Payer: Self-pay | Admitting: Family Medicine

## 2014-04-25 ENCOUNTER — Ambulatory Visit (INDEPENDENT_AMBULATORY_CARE_PROVIDER_SITE_OTHER): Payer: Managed Care, Other (non HMO) | Admitting: Family Medicine

## 2014-04-25 VITALS — BP 114/74 | HR 78 | Temp 98.7°F | Resp 16 | Ht 74.5 in | Wt 299.6 lb

## 2014-04-25 DIAGNOSIS — E66812 Obesity, class 2: Secondary | ICD-10-CM

## 2014-04-25 DIAGNOSIS — R739 Hyperglycemia, unspecified: Secondary | ICD-10-CM

## 2014-04-25 DIAGNOSIS — E669 Obesity, unspecified: Secondary | ICD-10-CM

## 2014-04-25 DIAGNOSIS — R10816 Epigastric abdominal tenderness: Secondary | ICD-10-CM

## 2014-04-25 DIAGNOSIS — F411 Generalized anxiety disorder: Secondary | ICD-10-CM

## 2014-04-25 DIAGNOSIS — R748 Abnormal levels of other serum enzymes: Secondary | ICD-10-CM

## 2014-04-25 DIAGNOSIS — R7309 Other abnormal glucose: Secondary | ICD-10-CM

## 2014-04-25 DIAGNOSIS — Z0000 Encounter for general adult medical examination without abnormal findings: Secondary | ICD-10-CM

## 2014-04-25 DIAGNOSIS — E789 Disorder of lipoprotein metabolism, unspecified: Secondary | ICD-10-CM

## 2014-04-25 LAB — POCT URINALYSIS DIPSTICK
BILIRUBIN UA: NEGATIVE
Glucose, UA: NEGATIVE
Ketones, UA: NEGATIVE
LEUKOCYTES UA: NEGATIVE
NITRITE UA: NEGATIVE
PH UA: 5.5
PROTEIN UA: NEGATIVE
Spec Grav, UA: 1.02
UROBILINOGEN UA: 0.2

## 2014-04-25 LAB — COMPREHENSIVE METABOLIC PANEL
ALBUMIN: 4 g/dL (ref 3.5–5.2)
ALT: 18 U/L (ref 0–53)
AST: 18 U/L (ref 0–37)
Alkaline Phosphatase: 58 U/L (ref 39–117)
BUN: 11 mg/dL (ref 6–23)
CALCIUM: 8.9 mg/dL (ref 8.4–10.5)
CHLORIDE: 104 meq/L (ref 96–112)
CO2: 26 meq/L (ref 19–32)
CREATININE: 1.34 mg/dL (ref 0.50–1.35)
Glucose, Bld: 75 mg/dL (ref 70–99)
POTASSIUM: 4.2 meq/L (ref 3.5–5.3)
Sodium: 137 mEq/L (ref 135–145)
Total Bilirubin: 0.6 mg/dL (ref 0.2–1.2)
Total Protein: 6.8 g/dL (ref 6.0–8.3)

## 2014-04-25 LAB — LIPID PANEL
CHOLESTEROL: 142 mg/dL (ref 0–200)
HDL: 37 mg/dL — ABNORMAL LOW (ref >39)
LDL CALC: 83 mg/dL (ref 0–99)
TRIGLYCERIDES: 112 mg/dL (ref <150)
Total CHOL/HDL Ratio: 3.8 Ratio
VLDL: 22 mg/dL (ref 0–40)

## 2014-04-25 MED ORDER — FAMOTIDINE 20 MG PO TABS: 20.0000 mg | ORAL_TABLET | Freq: Two times a day (BID) | ORAL | Status: DC

## 2014-04-25 NOTE — Progress Notes (Signed)
Subjective:    Patient ID: Francis Lin, male    DOB: 1976/07/26, 38 y.o.   MRN: [id number]  HPI  This 38 y.o. AA male is here for CPE. Recent problem with GAD did not respond to prescription medication (SSRI); pt had increased symptoms so he discontinued the medication. He has managed anxiety with better nutrition and regular physical activity.  Patient Active Problem List   Diagnosis Date Noted  . Unspecified vitamin D deficiency 09/22/2013  . Shifting sleep-work schedule, affecting sleep 06/20/2013  . Snoring disorder 06/20/2013  . Hypersomnia, persistent   . History of esophageal stricture 04/16/2013  . Pain in joint, ankle and foot 04/16/2013  . Gout 04/16/2013  . OBESITY, CLASS II, BMI 35- 39.9 03/15/2012  . Hypertriglyceridemia 03/15/2012  . CHEST PAIN UNSPECIFIED 12/02/2008  . DYSPHAGIA UNSPECIFIED 12/02/2008    Prior to Admission medications   Medication Sig Start Date End Date Taking? Authorizing Provider  bismuth subsalicylate (PEPTO BISMOL) 262 MG chewable tablet Chew 524 mg by mouth as needed.   Yes Historical Provider, MD  Naproxen Sodium (ALEVE PO) Take by mouth once a week. Once weekly   Yes Historical Provider, MD  Acetaminophen (TYLENOL PO) Take by mouth once a week. Once weekly    Historical Provider, MD  famotidine (PEPCID AC MAXIMUM STRENGTH) 20 MG tablet Take 1 tablet (20 mg total) by mouth 2 (two) times daily. 04/25/14   Barton Fanny, MD   PMHx, Surg Hx, Soc and Fam Hx reviewed.   Review of Systems  Constitutional: Positive for chills.  HENT: Positive for ear pain, mouth sores and sinus pressure.   Eyes: Positive for discharge and visual disturbance.  Respiratory: Positive for chest tightness.   Cardiovascular: Positive for chest pain.  Gastrointestinal: Positive for abdominal distention.  Endocrine: Negative.   Genitourinary: Negative.   Musculoskeletal: Positive for arthralgias, back pain, myalgias, neck pain and neck stiffness.       Chronic  and work-related. LBP w/ leg and foot pain and morning stiffness.  Skin: Positive for rash.  Allergic/Immunologic: Positive for environmental allergies and food allergies.  Neurological: Positive for dizziness, tremors, numbness and headaches.       Associated w/ panic attack and anxiety.  Hematological: Positive for adenopathy.  Psychiatric/Behavioral: Positive for agitation. The patient is nervous/anxious and is hyperactive.       Objective:   Physical Exam  Nursing note and vitals reviewed. Constitutional: He is oriented to person, place, and time. Vital signs are normal. He appears well-developed and well-nourished. No distress.  HENT:  Head: Normocephalic and atraumatic.  Right Ear: Hearing, tympanic membrane, external ear and ear canal normal.  Left Ear: Hearing, tympanic membrane, external ear and ear canal normal.  Nose: Nose normal. No mucosal edema, nasal deformity or septal deviation.  Mouth/Throat: Uvula is midline, oropharynx is clear and moist and mucous membranes are normal. No oral lesions. Normal dentition.  Eyes: Conjunctivae, EOM and lids are normal. Pupils are equal, round, and reactive to light. No scleral icterus.  Wears corrective lenses. Exam < 2 years ago.  Neck: Trachea normal, normal range of motion and full passive range of motion without pain. Neck supple. No spinous process tenderness and no muscular tenderness present. Carotid bruit is not present. No mass and no thyromegaly present.  Cardiovascular: Normal rate, regular rhythm, S1 normal, S2 normal, normal heart sounds and normal pulses.   No extrasystoles are present. PMI is not displaced.  Exam reveals no gallop and no friction  rub.   No murmur heard. Pulmonary/Chest: Effort normal and breath sounds normal. No respiratory distress.  Abdominal: Soft. Normal appearance and bowel sounds are normal. He exhibits no distension, no pulsatile midline mass and no mass. There is no hepatosplenomegaly or hepatomegaly.  There is tenderness in the epigastric area. There is no guarding and no CVA tenderness.  Abdominal obesity.  Genitourinary:  Deferred.  Musculoskeletal:       Cervical back: Normal.       Thoracic back: Normal.       Lumbar back: Normal.  Remainder of exam unremarkable.  Lymphadenopathy:       Head (right side): No submental, no submandibular, no tonsillar, no posterior auricular and no occipital adenopathy present.       Head (left side): No submental, no submandibular, no tonsillar, no posterior auricular and no occipital adenopathy present.    He has no cervical adenopathy.       Right: No inguinal and no supraclavicular adenopathy present.       Left: No inguinal and no supraclavicular adenopathy present.  Neurological: He is alert and oriented to person, place, and time. He has normal strength and normal reflexes. He displays no atrophy and no tremor. No cranial nerve deficit or sensory deficit. He exhibits normal muscle tone. Coordination and gait normal.  Skin: Skin is warm, dry and intact. No ecchymosis, no lesion and no rash noted. He is not diaphoretic. No cyanosis or erythema. Nails show no clubbing.  Psychiatric: His speech is normal and behavior is normal. Judgment and thought content normal. His mood appears anxious. His affect is not blunt and not inappropriate. Cognition and memory are normal. He does not exhibit a depressed mood.    Results for orders placed in visit on 04/25/14  POCT URINALYSIS DIPSTICK      Result Value Ref Range   Color, UA yellow     Clarity, UA clear     Glucose, UA neg     Bilirubin, UA neg     Ketones, UA neg     Spec Grav, UA 1.020     Blood, UA trace-int     pH, UA 5.5     Protein, UA neg     Urobilinogen, UA 0.2     Nitrite, UA neg     Leukocytes, UA Negative        Assessment & Plan:  Routine general medical examination at a health care facility - Plan: POCT urinalysis dipstick  GAD (generalized anxiety disorder)- Pt not taking  medication by choice; continue to work on stress management.  Low serum HDL - Plan: Lipid panel  Epigastric abdominal tenderness - Trial of H2- antagonist.  Pt intolerant of PPIs. Plan: H. pylori antibody, IgG  Hyperglycemia - Plan: Comprehensive metabolic panel, POCT urinalysis dipstick  OBESITY, CLASS II, BMI 35- 39.9 - Encouraged ongoing weight loss and healthier lifestyle. Plan: Comprehensive metabolic panel   Meds ordered this encounter  Medications  . famotidine (PEPCID AC MAXIMUM STRENGTH) 20 MG tablet    Sig: Take 1 tablet (20 mg total) by mouth 2 (two) times daily.    Dispense:  60 tablet    Refill:  3

## 2014-04-25 NOTE — Patient Instructions (Addendum)
Keeping you healthy  Get these tests  Blood pressure- Have your blood pressure checked once a year by your healthcare provider.  Normal blood pressure is 120/80.  Weight- Have your body mass index (BMI) calculated to screen for obesity.  BMI is a measure of body fat based on height and weight. You can also calculate your own BMI at [employer]velBags.it.  Cholesterol- Have your cholesterol checked regularly starting at age 38, sooner may be necessary if you have diabetes, high blood pressure, if a family member developed heart diseases at an early age or if you smoke.   Chlamydia, HIV, and other sexual transmitted disease- Get screened each year until the age of 31 then within three months of each new sexual partner.  Diabetes- Have your blood sugar checked regularly if you have high blood pressure, high cholesterol, a family history of diabetes or if you are overweight.  Get these vaccines  Flu shot- Every fall.  Tetanus shot- Every 10 years.  Menactra- Single dose; prevents meningitis.  Take these steps  Don't smoke- If you do smoke, ask your healthcare provider about quitting. For tips on how to quit, go to www.smokefree.gov or call 1-800-QUIT-NOW.  Be physically active- Exercise 5 days a week for at least 30 minutes.  If you are not already physically active start slow and gradually work up to 30 minutes of moderate physical activity.  Examples of moderate activity include walking briskly, mowing the yard, dancing, swimming bicycling, etc.  Eat a healthy diet- Eat a variety of healthy foods such as fruits, vegetables, low fat milk, low fat cheese, yogurt, lean meats, poultry, fish, beans, tofu, etc.  For more information on healthy eating, go to www.thenutritionsource.org  Drink alcohol in moderation- Limit alcohol intake two drinks or less a day.  Never drink and drive.  Dentist- Brush and floss teeth twice daily; visit your dentis twice a year.  Depression-Your emotional  health is as important as your physical health.  If you're feeling down, losing interest in things you normally enjoy please talk with your healthcare provider.  Gun Safety- If you keep a gun in your home, keep it unloaded and with the safety lock on.  Bullets should be stored separately.  Helmet use- Always wear a helmet when riding a motorcycle, bicycle, rollerblading or skateboarding.  Safe sex- If you may be exposed to a sexually transmitted infection, use a condom  Seat belts- Seat bels can save your life; always wear one.  Smoke/Carbon Monoxide detectors- These detectors need to be installed on the appropriate level of your home.  Replace batteries at least once a year.  Skin Cancer- When out in the sun, cover up and use sunscreen SPF 15 or higher.  Violence- If anyone is threatening or hurting you, please tell your healthcare provider.   Indigestion Indigestion is discomfort in the upper abdomen that is caused by underlying problems such as gastroesophageal reflux disease (GERD), ulcers, or gallbladder problems.  CAUSES  Indigestion can be caused by many things. Possible causes include:  Stomach acid in the esophagus.  Stomach infections, usually caused by the bacteria H. pylori.  Being overweight.  Hiatal hernia. This means part of the stomach pushes up through the diaphragm.  Overeating.  Emotional problems, such as stress, anxiety, or depression.  Poor nutrition.  Consuming too much alcohol, tobacco, or caffeine.  Consuming spicy foods, fats, peppermint, chocolate, tomato products, citrus, or fruit juices.  Medicines such as aspirin and other anti-inflammatory drugs, hormones, steroids, and thyroid  medicines.  Gastroparesis. This is a condition in which the stomach does not empty properly.  Stomach cancer.  Pregnancy, due to an increase in hormone levels, a relaxation of muscles in the digestive tract, and pressure on the stomach from the growing  fetus. SYMPTOMS   Uncomfortable feeling of fullness after eating.  Pain or burning sensation in the upper abdomen.  Bloating.  Belching and gas.  Nausea and vomiting.  Acidic taste in the mouth.  Burning sensation in the chest (heartburn). DIAGNOSIS  Your caregiver will review your medical history and perform a physical exam. Other tests, such as blood tests, stool tests, X-rays, and other imaging scans, may be done to check for more serious problems. TREATMENT  Liquid antacids and other drugs may be given to block stomach acid secretion. Medicines that increase esophageal muscle tone may also be given to help reduce symptoms. If an infection is found, antibiotic medicine may be given. HOME CARE INSTRUCTIONS  Avoid foods and drinks that make your symptoms worse, such as:  Caffeine or alcoholic drinks.  Chocolate.  Peppermint or mint flavorings.  Garlic and onions.  Spicy foods.  Citrus fruits, such as oranges, lemons, or limes.  Tomato-based foods such as sauce, chili, salsa, and pizza.  Fried and fatty foods.  Avoid eating for the 3 hours prior to your bedtime.  Eat small, frequent meals instead of large meals.  Stop smoking if you smoke.  Maintain a healthy weight.  Wear loose-fitting clothing. Do not wear anything tight around your waist that causes pressure on your stomach.  Raise the head of your bed 4 to 8 inches with wood blocks to help you sleep. Extra pillows will not help.  Only take over-the-counter or prescription medicines as directed by your caregiver.  Do not take aspirin, ibuprofen, or other nonsteroidal anti-inflammatory drugs (NSAIDs). SEEK IMMEDIATE MEDICAL CARE IF:   You are not better after 2 days.  You have chest pressure or pain that radiates up into your neck, arms, back, jaw, or upper abdomen.  You have difficulty swallowing.  You keep vomiting.  You have black or bloody stools.  You have a fever.  You have dizziness,  fainting, difficulty breathing, or heavy sweating.  You have severe abdominal pain.  You lose weight without trying. MAKE SURE YOU:  Understand these instructions.  Will watch your condition.  Will get help right away if you are not doing well or get worse. Document Released: 11/03/2004 Document Revised: 12/19/2011 Document Reviewed: 05/11/2011 [facility] Patient Information 2015 [place], [place]. This information is not intended to replace advice given to you by your health care provider. Make sure you discuss any questions you have with your health care provider.    Back Pain, Adult Back pain is very common. The pain often gets better over time. The cause of back pain is usually not dangerous. Most people can learn to manage their back pain on their own.  HOME CARE   Stay active. Start with short walks on flat ground if you can. Try to walk farther each day.  Do not sit, drive, or stand in one place for more than 30 minutes. Do not stay in bed.  Do not avoid exercise or work. Activity can help your back heal faster.  Be careful when you bend or lift an object. Bend at your knees, keep the object close to you, and do not twist.  Sleep on a firm mattress. Lie on your side, and bend your knees. If you lie  on your back, put a pillow under your knees.  Only take medicines as told by your doctor.  Put ice on the injured area.  Put ice in a plastic bag.  Place a towel between your skin and the bag.  Leave the ice on for 15-20 minutes, 03-04 times a day for the first 2 to 3 days. After that, you can switch between ice and heat packs.  Ask your doctor about back exercises or massage.  Avoid feeling anxious or stressed. Find good ways to deal with stress, such as exercise. GET HELP RIGHT AWAY IF:   Your pain does not go away with rest or medicine.  Your pain does not go away in 1 week.  You have new problems.  You do not feel well.  The pain spreads into your legs.  You  cannot control when you poop (bowel movement) or pee (urinate).  Your arms or legs feel weak or lose feeling (numbness).  You feel sick to your stomach (nauseous) or throw up (vomit).  You have belly (abdominal) pain.  You feel like you may pass out (faint). MAKE SURE YOU:   Understand these instructions.  Will watch your condition.  Will get help right away if you are not doing well or get worse. Document Released: 03/14/2008 Document Revised: 12/19/2011 Document Reviewed: 02/14/2011 [facility] Patient Information 2015 [place], [place]. This information is not intended to replace advice given to you by your health care provider. Make sure you discuss any questions you have with your health care provider.   I have encouraged ongoing weight loss; this will help with back pain reduction and help improve your overall health status. Below is some information about heart-healthy nutrition.       Mediterranean Diet  Why follow it? Research shows.   Those who follow the Mediterranean diet have a reduced risk of heart disease    The diet is associated with a reduced incidence of Parkinson's and Alzheimer's diseases   People following the diet may have longer life expectancies and lower rates of chronic diseases    The Dietary Guidelines for Americans recommends the Mediterranean diet as an eating plan to promote health and prevent disease  What Is the Mediterranean Diet?    Healthy eating plan based on typical foods and recipes of Mediterranean-style cooking   The diet is primarily a plant based diet; these foods should make up a majority of meals   Starches - Plant based foods should make up a majority of meals - They are an important sources of vitamins, minerals, energy, antioxidants, and fiber - Choose whole grains, foods high in fiber and minimally processed items  - Typical grain sources include wheat, oats, barley, corn, brown rice, bulgar, farro, millet, polenta, couscous  -  Various types of beans include chickpeas, lentils, fava beans, black beans, white beans   Fruits  Veggies - Large quantities of antioxidant rich fruits & veggies; 6 or more servings  - Vegetables can be eaten raw or lightly drizzled with oil and cooked  - Vegetables common to the traditional Mediterranean Diet include: artichokes, arugula, beets, broccoli, brussel sprouts, cabbage, carrots, celery, collard greens, cucumbers, eggplant, kale, leeks, lemons, lettuce, mushrooms, okra, onions, peas, peppers, potatoes, pumpkin, radishes, rutabaga, shallots, spinach, sweet potatoes, turnips, zucchini - Fruits common to the Mediterranean Diet include: apples, apricots, avocados, cherries, clementines, dates, figs, grapefruits, grapes, melons, nectarines, oranges, peaches, pears, pomegranates, strawberries, tangerines  Fats - Replace butter and margarine with healthy oils, such as  olive oil, canola oil, and tahini  - Limit nuts to no more than a handful a day  - Nuts include walnuts, almonds, pecans, pistachios, pine nuts  - Limit or avoid candied, honey roasted or heavily salted nuts - Olives are central to the Mediterranean diet - can be eaten whole or used in a variety of dishes   Meats Protein - Limiting red meat: no more than a few times a month - When eating red meat: choose lean cuts and keep the portion to the size of deck of cards - Eggs: approx. 0 to 4 times a week  - Fish and lean poultry: at least 2 a week  - Healthy protein sources include, chicken, [place], lean beef, lamb - Increase intake of seafood such as tuna, salmon, trout, mackerel, shrimp, scallops - Avoid or limit high fat processed meats such as sausage and bacon  Dairy - Include moderate amounts of low fat dairy products  - Focus on healthy dairy such as fat free yogurt, skim milk, low or reduced fat cheese - Limit dairy products higher in fat such as whole or 2% milk, cheese, ice cream  Alcohol - Moderate amounts of red wine is ok   - No more than 5 oz daily for women (all ages) and men older than age 95  - No more than 10 oz of wine daily for men younger than 25  Other - Limit sweets and other desserts  - Use herbs and spices instead of salt to flavor foods  - Herbs and spices common to the traditional Mediterranean Diet include: basil, bay leaves, chives, cloves, cumin, fennel, garlic, lavender, marjoram, mint, oregano, parsley, pepper, rosemary, sage, savory, sumac, tarragon, thyme   It's not just a diet, it's a lifestyle:    The Mediterranean diet includes lifestyle factors typical of those in the region    Foods, drinks and meals are best eaten with others and savored   Daily physical activity is important for overall good health   This could be strenuous exercise like running and aerobics   This could also be more leisurely activities such as walking, housework, yard-work, or taking the stairs   Moderation is the key; a balanced and healthy diet accommodates most foods and drinks   Consider portion sizes and frequency of consumption of certain foods   Meal Ideas & Options:    Breakfast:  o Whole wheat toast or whole wheat English muffins with peanut butter & hard boiled egg o Steel cut oats topped with apples & cinnamon and skim milk  o Fresh fruit: banana, strawberries, melon, berries, peaches  o Smoothies: strawberries, bananas, greek yogurt, peanut butter o Low fat greek yogurt with blueberries and granola  o Egg white omelet with spinach and mushrooms o Breakfast couscous: whole wheat couscous, apricots, skim milk, cranberries    Sandwiches:  o Hummus and grilled vegetables (peppers, zucchini, squash) on whole wheat bread   o Grilled chicken on whole wheat pita with lettuce, tomatoes, cucumbers or tzatziki  o Tuna salad on whole wheat bread: tuna salad made with greek yogurt, olives, red peppers, capers, green onions o Garlic rosemary lamb pita: lamb sauted with garlic, rosemary, salt & pepper; add  lettuce, cucumber, greek yogurt to pita - flavor with lemon juice and black pepper    Seafood:  o Mediterranean grilled salmon, seasoned with garlic, basil, parsley, lemon juice and black pepper o Shrimp, lemon, and spinach whole-grain pasta salad made with low fat greek yogurt  o Seared scallops with lemon orzo  o Seared tuna steaks seasoned salt, pepper, coriander topped with tomato mixture of olives, tomatoes, olive oil, minced garlic, parsley, green onions and cappers    Meats:  o Herbed greek chicken salad with kalamata olives, cucumber, feta  o Red bell peppers stuffed with spinach, bulgur, lean ground beef (or lentils) & topped with feta   o Kebabs: skewers of chicken, tomatoes, onions, zucchini, squash  o [place] burgers: made with red onions, mint, dill, lemon juice, feta cheese topped with roasted red peppers   Vegetarian o Cucumber salad: cucumbers, artichoke hearts, celery, red onion, feta cheese, tossed in olive oil & lemon juice  o Hummus and whole grain pita points with a greek salad (lettuce, tomato, feta, olives, cucumbers, red onion) o Lentil soup with celery, carrots made with vegetable broth, garlic, salt and pepper  o Tabouli salad: parsley, bulgur, mint, scallions, cucumbers, tomato, radishes, lemon juice, olive oil, salt and pepper. o

## 2014-04-27 ENCOUNTER — Encounter: Payer: Self-pay | Admitting: Family Medicine

## 2014-04-28 LAB — H. PYLORI ANTIBODY, IGG: H Pylori IgG: 0.47 {ISR}

## 2014-05-06 ENCOUNTER — Telehealth: Payer: Self-pay

## 2014-05-06 NOTE — Telephone Encounter (Signed)
Pt is calling for lab results 

## 2014-05-06 NOTE — Telephone Encounter (Signed)
Left message to return call 

## 2014-05-07 NOTE — Telephone Encounter (Signed)
Left detailed msg on machine for normal results.

## 2014-05-12 ENCOUNTER — Telehealth: Payer: Self-pay | Admitting: Family Medicine

## 2014-05-12 NOTE — Telephone Encounter (Signed)
Patient came in for a company drug screen and asked for a copy of his labs i gave patient a copy and let him know that he can see it in my chart

## 2014-07-24 IMAGING — US US ABDOMEN COMPLETE
1 series · 13 of 25 positions shown · non-contrast
Comparison: None.

CLINICAL DATA: Left upper quadrant, hypogastric abdominal pain

COMPLETE ABDOMINAL ULTRASOUND

[Series 1: us abdomen complete · 13 of 79 slices shown]
[im 1/79]
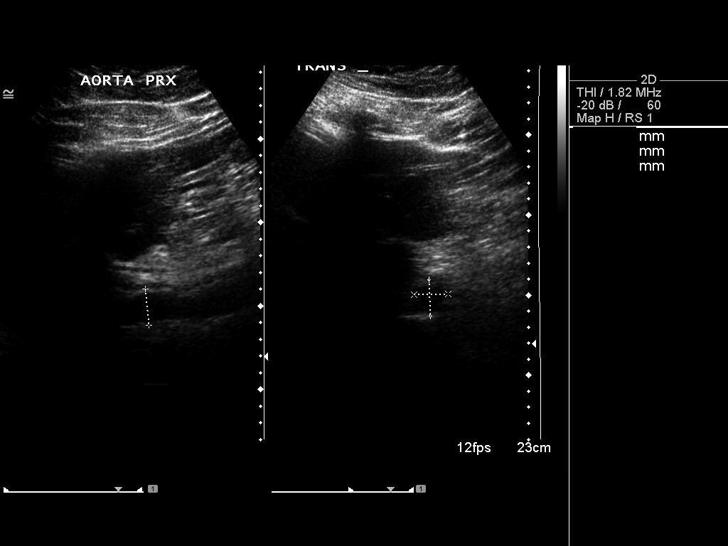
[im 7/79]
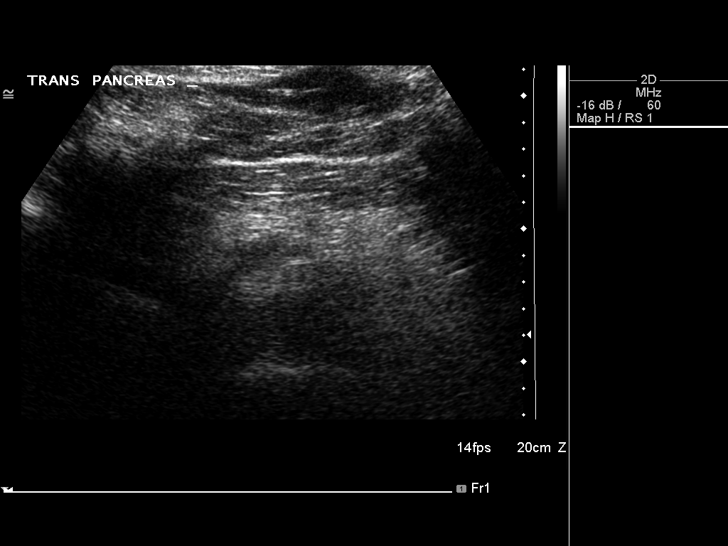
[im 14/79]
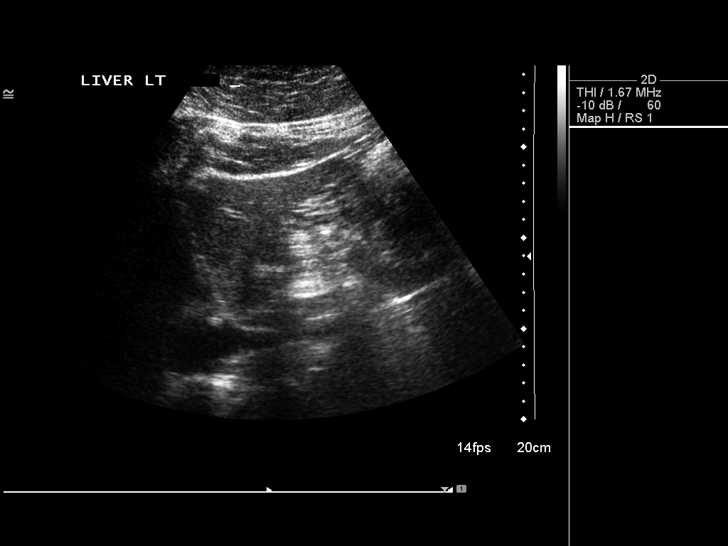
[im 20/79]
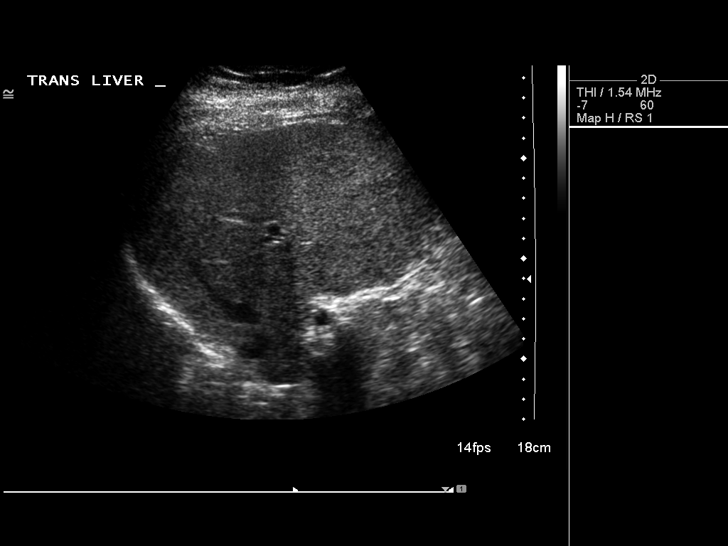
[im 27/79]
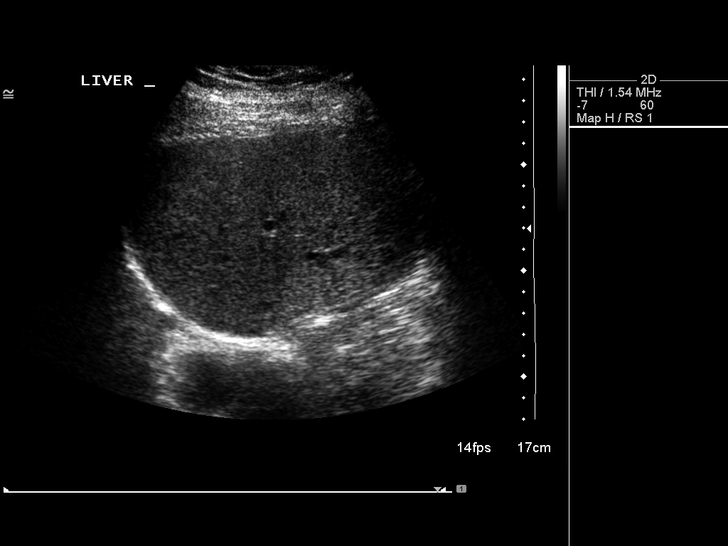
[im 33/79]
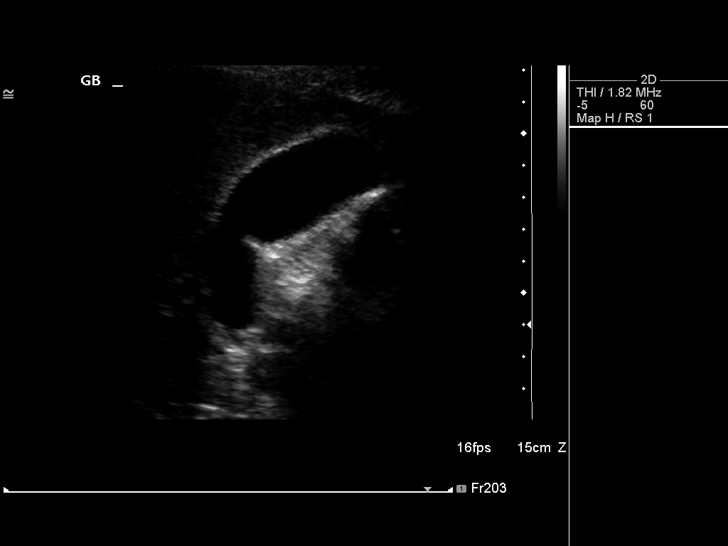
[im 40/79]
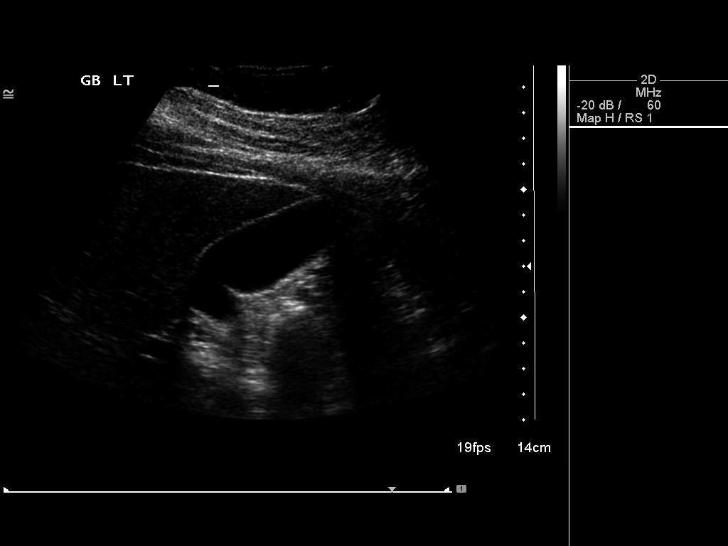
[im 46/79]
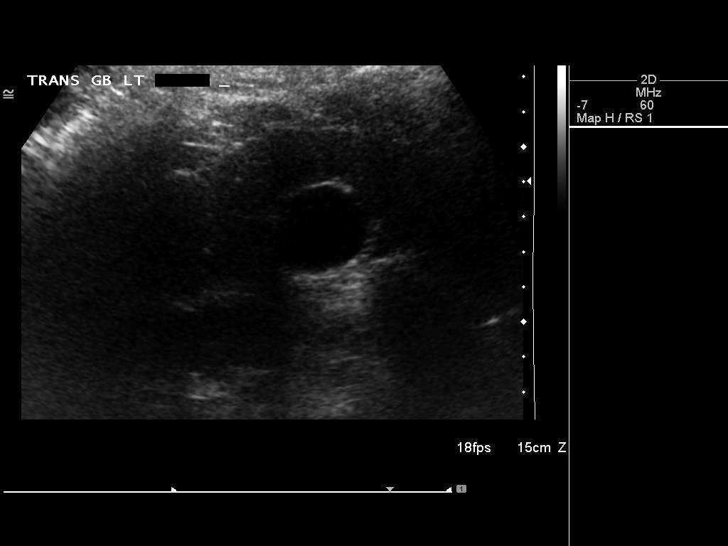
[im 53/79]
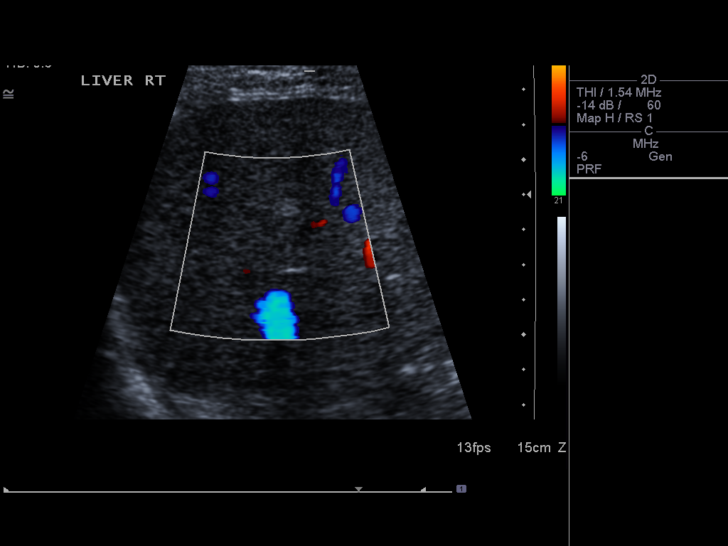
[im 59/79]
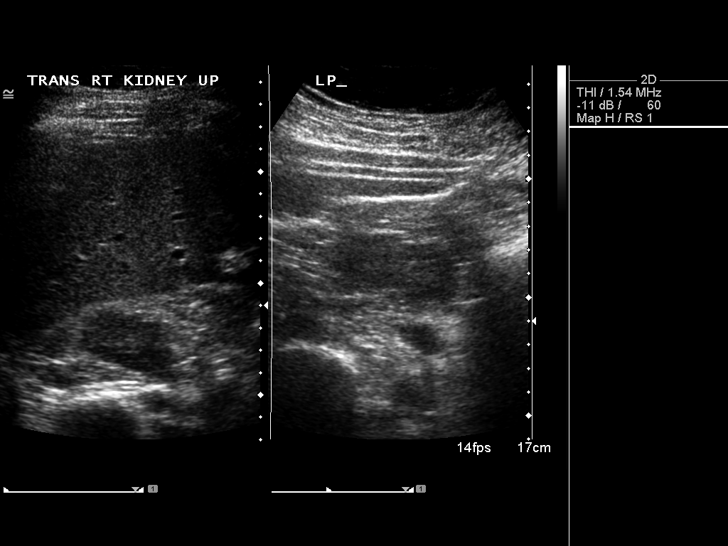
[im 66/79]
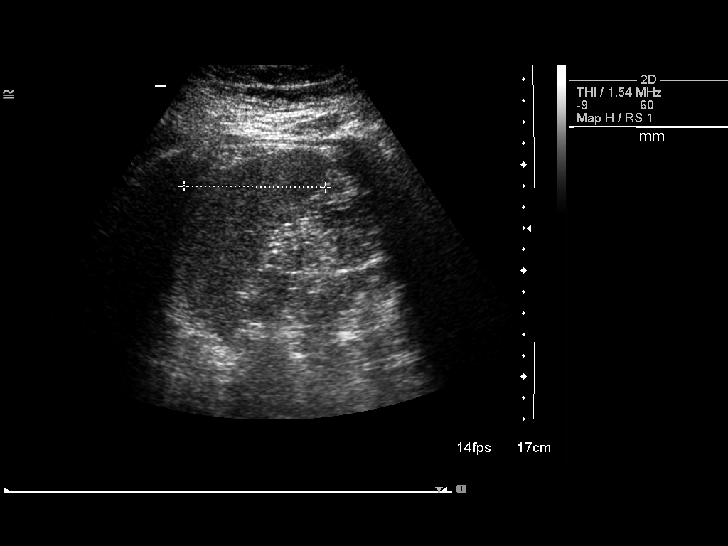
[im 72/79]
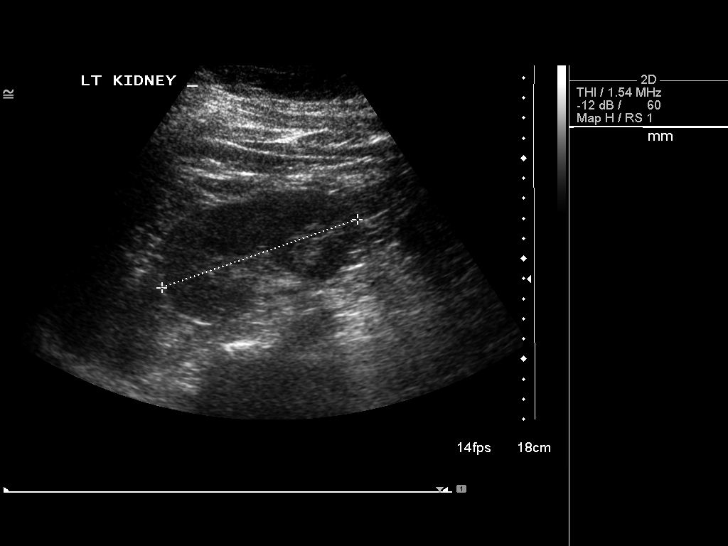
[im 79/79]
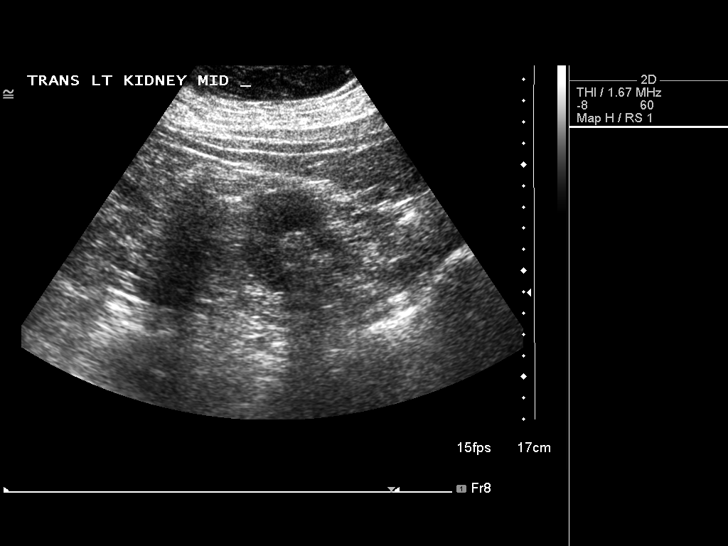

[13 of 25 positions shown; findings below may reference images not displayed]

FINDINGS: Examination is degraded secondary to patient body habitus.

Gallbladder:  Sonographically normal.  No echogenic gallstones or
gall sludge.  No gallbladder wall thickening or pericholecystic
fluid.  Negative sonographic Murphy's sign.

Common bile duct:  Normal in size measuring 3.6 mm in diameter

Liver:  Homogeneous hepatic echotexture.  Note is made of two sub
centimeter lesions within the right lobe of the liver measuring
approximately 0.7 and 0.6 cm in diameter (image 54 and 64
respectively). Definitely demonstrate internal blood flow or
posterior acoustic shadowing. No definite evidence of intrahepatic
biliary ductal dilatation.  No ascites.

IVC:  Appears normal.

Pancreas:  Limited visualization of the pancreatic head and neck is
normal.  Visualization of the pancreatic body and tail is obscured
by bowel gas.

Spleen:  Normal in size measuring 6.7 cm in length.

Right Kidney:  Normal cortical thickness, echogenicity and size,
measuring 11.8 cm in length. Note is made of mild fetal lobulation
of the superior pole right kidney (images 58 and 59). No focal
renal lesions.  No echogenic renal stones.  No urinary obstruction.

Left Kidney:  Normal cortical thickness, echogenicity and size,
measuring 10.3 the tip in the cm in length.  No focal renal
lesions.  No echogenic renal stones.  No urinary obstruction.

Abdominal aorta:  No aneurysm identified.
IMPRESSION: 1.  No explanation for patient's left upper quadrant and hypo
gastric abdominal pain.  Specifically, no evidence of
cholelithiasis or urinary obstruction.
2.  Incidental note made of two sub centimeter hyperechoic lesions
within the right lobe of the liver, too small to accurately
characterize though favored to represent benign hepatic
hemangiomas.

## 2014-08-28 ENCOUNTER — Encounter: Payer: Self-pay | Admitting: Family Medicine

## 2014-08-28 ENCOUNTER — Ambulatory Visit: Payer: Managed Care, Other (non HMO) | Admitting: Family Medicine

## 2014-08-28 ENCOUNTER — Ambulatory Visit (INDEPENDENT_AMBULATORY_CARE_PROVIDER_SITE_OTHER): Payer: Managed Care, Other (non HMO) | Admitting: Family Medicine

## 2014-08-28 VITALS — BP 124/79 | HR 71 | Temp 98.1°F | Resp 16 | Ht 74.5 in | Wt 290.0 lb

## 2014-08-28 DIAGNOSIS — Z8719 Personal history of other diseases of the digestive system: Secondary | ICD-10-CM

## 2014-08-28 DIAGNOSIS — F418 Other specified anxiety disorders: Secondary | ICD-10-CM | POA: Insufficient documentation

## 2014-08-28 DIAGNOSIS — K219 Gastro-esophageal reflux disease without esophagitis: Secondary | ICD-10-CM

## 2014-08-28 DIAGNOSIS — R1310 Dysphagia, unspecified: Secondary | ICD-10-CM

## 2014-08-28 MED ORDER — RABEPRAZOLE SODIUM 20 MG PO TBEC: 20.0000 mg | DELAYED_RELEASE_TABLET | Freq: Every day | ORAL | Status: DC

## 2014-08-28 NOTE — Progress Notes (Signed)
Subjective:    Patient ID: Francis Lin, male    DOB: 06-Sep-1976, 38 y.o.   MRN: [id number] Patient Active Problem List   Diagnosis Date Noted  . Situational anxiety 08/28/2014  . Unspecified vitamin D deficiency 09/22/2013  . Shifting sleep-work schedule, affecting sleep 06/20/2013  . Snoring disorder 06/20/2013  . Hypersomnia, persistent   . History of esophageal stricture 04/16/2013  . Pain in joint, ankle and foot 04/16/2013  . Gout 04/16/2013  . OBESITY, CLASS II, BMI 35- 39.9 03/15/2012  . Hypertriglyceridemia 03/15/2012  . CHEST PAIN UNSPECIFIED 12/02/2008  . Dysphagia 12/02/2008    Prior to Admission medications   Medication Sig Start Date End Date Taking? Authorizing Provider  Acetaminophen (TYLENOL PO) Take by mouth once a week. Once weekly   Yes Historical Provider, MD  bismuth subsalicylate (PEPTO BISMOL) 262 MG chewable tablet Chew 524 mg by mouth as needed.   Yes Historical Provider, MD  Naproxen Sodium (ALEVE PO) Take by mouth once a week. Once weekly   Yes Historical Provider, MD          Allergies  Allergen Reactions  . Caffeine Other (See Comments)    "makes feel bad", headaches  . Omeprazole     Mood swing  . Peanuts [Peanut Oil]   . Penicillins Other (See Comments)    Headaches   . Seasonal Ic [Cholestatin]     Sneezing,runny nose , itchy eyes  . Lactose Intolerance (Gi)    HPI  This is a 38 year old male with PMH GAD and GERD presenting for follow up.   GAD: He tried an SSRI in the past but he reports this made his anxiety worse. He has opted to manage his stress with diet and exercise. He is having a hard time finding time to exercise since he started a new job. He also reports he is eating worse because of this. He is currently managing his stress with meditation and prayer.  GERD: He reports his epigastric pain is improved. He will have pain that is sometimes relieved with going to the bathroom. He has more GERD symptoms when he eats dairy,  spicy foods, caffeine or if he lays down after a heavy meal. If he avoids these foods and avoids laying down, his symptoms improve. H. Pylori at last visit was negative. He had an upper endoscopy in 2010 and was diagnosed with esophagitis and a distal esophagus stricture which was dilated at that time. He states he will sometimes feel like food gets stuck in his esophagus especially if he eats too fast or does not completely chew his food. His is intolerable to PPIs and H2 blockers. He states the brand name aciphex is the only thing that has helped him in the past but it is too expensive. Abdominal CT and abdominal U/S negative in 2014.  Review of Systems  Constitutional: Negative.   Cardiovascular: Positive for chest pain.  Gastrointestinal: Positive for abdominal pain. Negative for nausea and vomiting.  Musculoskeletal: Positive for back pain.  Skin:       Dry skin on hands  Psychiatric/Behavioral: The patient is nervous/anxious.       Objective:   Physical Exam  Constitutional: He is oriented to person, place, and time. He appears well-developed and well-nourished. No distress.  HENT:  Head: Normocephalic and atraumatic.  Right Ear: Hearing normal.  Left Ear: Hearing normal.  Nose: Nose normal.  Eyes: Lids are normal. Right eye exhibits no discharge. Left eye exhibits no  discharge. No scleral icterus.  Cardiovascular: Normal rate, regular rhythm, normal heart sounds and normal pulses.   No murmur heard. Pulmonary/Chest: Effort normal and breath sounds normal. He has no wheezes. He has no rhonchi. He has no rales.  Abdominal: Soft. Normal appearance. There is no tenderness. There is negative Murphy's sign.  Neurological: He is alert and oriented to person, place, and time.  Skin: Skin is warm, dry and intact. No lesion and no rash noted.  Psychiatric: He has a normal mood and affect. His speech is normal and behavior is normal. Thought content normal.      Assessment & Plan:  1.  Gastroesophageal reflux disease, esophagitis presence not specified 2. Dysphagia 3. History of esophageal stricture The only medication that has helped him in the past was brand name aciphex. This prescription was given to him today. Since he has a history of esophagitis and esophageal stricture and he is endorsing intermittent globus sensation, will refer for barium swallow. We discussed avoidance of food triggers.  - DG Esophagus; Future  4. Situational anxiety He was intolerant to SSRIs. He has opted to manage his anxiety and stress with lifestyle choices. We discussed the need to get back on track with diet and exercise. He will continue with prayer and meditation.   Benjaman Pott Drenda Freeze, MHS [facility]  08/28/2014

## 2014-08-28 NOTE — Patient Instructions (Signed)

## 2014-08-29 ENCOUNTER — Encounter: Payer: Self-pay | Admitting: Family Medicine

## 2014-08-29 NOTE — Progress Notes (Signed)
S:  This 38 y.o. AA male returns for follow-up; today, his concern is increasing GI discomfort (hx of GERD and esophageal stricture) and change in stools. 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 He is coping w/ increased stress in a holistic manner.  See documentation per PA-C; nothing to add.  O: Examination per PA-C is accurate. I have reviewed and agree.  A/P: Gastroesophageal reflux disease, esophagitis presence not specified - Plan: DG Esophagus  Dysphagia  History of esophageal stricture  Situational anxiety  I have reviewed documentation and discussed assessment and plan w/ N. Bush, PA-C.  B.Schon Zeiders, MD [facility] [place]

## 2014-09-07 ENCOUNTER — Emergency Department (HOSPITAL_COMMUNITY)
Admission: EM | Admit: 2014-09-07 | Discharge: 2014-09-07 | Disposition: A | Discharge status: Home / Self Care | Payer: Managed Care, Other (non HMO) | Attending: Emergency Medicine | Admitting: Emergency Medicine

## 2014-09-07 ENCOUNTER — Encounter (HOSPITAL_COMMUNITY): Payer: Self-pay | Admitting: Emergency Medicine

## 2014-09-07 ENCOUNTER — Emergency Department (HOSPITAL_COMMUNITY): Payer: Managed Care, Other (non HMO)

## 2014-09-07 DIAGNOSIS — J189 Pneumonia, unspecified organism: Secondary | ICD-10-CM

## 2014-09-07 DIAGNOSIS — K219 Gastro-esophageal reflux disease without esophagitis: Secondary | ICD-10-CM | POA: Insufficient documentation

## 2014-09-07 DIAGNOSIS — J159 Unspecified bacterial pneumonia: Secondary | ICD-10-CM | POA: Diagnosis not present

## 2014-09-07 DIAGNOSIS — Z880 Allergy status to penicillin: Secondary | ICD-10-CM | POA: Diagnosis not present

## 2014-09-07 DIAGNOSIS — R51 Headache: Secondary | ICD-10-CM | POA: Insufficient documentation

## 2014-09-07 DIAGNOSIS — R630 Anorexia: Secondary | ICD-10-CM | POA: Insufficient documentation

## 2014-09-07 DIAGNOSIS — Z79899 Other long term (current) drug therapy: Secondary | ICD-10-CM | POA: Insufficient documentation

## 2014-09-07 DIAGNOSIS — Z8669 Personal history of other diseases of the nervous system and sense organs: Secondary | ICD-10-CM | POA: Diagnosis not present

## 2014-09-07 DIAGNOSIS — R52 Pain, unspecified: Secondary | ICD-10-CM | POA: Diagnosis present

## 2014-09-07 DIAGNOSIS — R509 Fever, unspecified: Secondary | ICD-10-CM

## 2014-09-07 LAB — URINALYSIS, ROUTINE W REFLEX MICROSCOPIC
Bilirubin Urine: NEGATIVE
Glucose, UA: NEGATIVE mg/dL
Hgb urine dipstick: NEGATIVE
Ketones, ur: NEGATIVE mg/dL
Leukocytes, UA: NEGATIVE
NITRITE: NEGATIVE
Protein, ur: NEGATIVE mg/dL
Specific Gravity, Urine: 1.025 (ref 1.005–1.030)
UROBILINOGEN UA: 1 mg/dL (ref 0.0–1.0)
pH: 6.5 (ref 5.0–8.0)

## 2014-09-07 LAB — INFLUENZA PANEL BY PCR (TYPE A & B)
H1N1FLUPCR: NOT DETECTED
INFLAPCR: NEGATIVE
Influenza B By PCR: NEGATIVE

## 2014-09-07 MED ORDER — ACETAMINOPHEN 500 MG PO TABS: 1000.0000 mg | ORAL_TABLET | Freq: Once | ORAL | Status: AC

## 2014-09-07 MED ORDER — HYDROCOD POLST-CHLORPHEN POLST 10-8 MG/5ML PO LQCR: 5.0000 mL | Freq: Two times a day (BID) | ORAL | Status: DC | PRN

## 2014-09-07 MED ORDER — LEVOFLOXACIN 750 MG PO TABS: 750.0000 mg | ORAL_TABLET | Freq: Every day | ORAL | Status: DC

## 2014-09-07 MED ORDER — LEVOFLOXACIN 750 MG PO TABS
750.0000 mg | ORAL_TABLET | Freq: Once | ORAL | Status: AC
  Administered 2014-09-07: 750 mg via ORAL
  Filled 2014-09-07: qty 1

## 2014-09-07 MED ORDER — ALBUTEROL SULFATE HFA 108 (90 BASE) MCG/ACT IN AERS
2.0000 | INHALATION_SPRAY | Freq: Once | RESPIRATORY_TRACT | Status: AC
  Administered 2014-09-07: 2 via RESPIRATORY_TRACT
  Filled 2014-09-07: qty 6.7

## 2014-09-07 MED ORDER — ACETAMINOPHEN 500 MG PO TABS
ORAL_TABLET | ORAL | Status: AC
Start: 2014-09-07 — End: 2014-09-07
  Administered 2014-09-07: 1000 mg via ORAL
  Filled 2014-09-07: qty 2

## 2014-09-07 NOTE — ED Notes (Signed)
Patient c/o generalized body aches and chills that started yesterday. Unsure of any fevers. Patient reports nonproductive cough. Patient reports soreness in chest with cough. Denies any nausea, vomiting, or diarrhea.

## 2014-09-07 NOTE — ED Provider Notes (Signed)
CSN: [id number]     Arrival date & time 09/07/14  1400 History  This chart was scribed for non-physician practitioner, Evalee Jefferson, PA-C,working with [place], DO, by Marlowe Kays, ED Scribe. This patient was seen in room APFT20/APFT20 and the patient's care was started at 3:57 PM.  Chief Complaint  Patient presents with  . Generalized Body Aches  . Chills   The history is provided by the patient. No language interpreter was used.    HPI Comments:  Francis Lin is a 38 y.o. male who presents to the Emergency Department complaining of generalized body aches and mildly productive cough that began three days ago. He reports associated fever, chills and HA that began two days ago. Pt reports mild nausea and a decrease in appetite but states he has been trying to continue to drink water. Pt states he has been aching, sharp pain in his chest and mid to lower back and is worsened by walking and coughing. He has taken TheraFlu at home with the last dose being approximately 15 hours ago. He reports being around coworkers that were not feeling well last week. Denies modifying factors. Denies sore throat, congestion, abdominal pain, dysuria, vomiting or diarrhea. Did not receive a flu vaccination this year. PMH of IBS, esophagitis and GERD.  PCP is Dr. Ellsworth Lennox at [facility]  Past Medical History  Diagnosis Date  . IBS (irritable bowel syndrome)   . Esophagitis   . Acid reflux   . Hypersomnia, persistent   . Shifting sleep-work schedule, affecting sleep 06/20/2013  . 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 No pertinent past surgical history. Family History  Problem Relation Age of Onset  . Edema Sister   . Diabetes Sister   . Bipolar disorder Sister   . Heart attack Father   . Arthritis Father   . Hypertension Father   . Cancer Father     multiple myeloma   History  Substance Use Topics  . Smoking status: Never Smoker   . Smokeless  tobacco: Never Used  . Alcohol Use: No    Review of Systems  Constitutional: Positive for fever, chills and appetite change.  HENT: Negative for congestion, ear pain, rhinorrhea, sinus pressure, sore throat, trouble swallowing and voice change.   Eyes: Negative for discharge.  Respiratory: Positive for cough. Negative for shortness of breath, wheezing and stridor.   Cardiovascular: Negative for chest pain.  Gastrointestinal: Positive for nausea. Negative for vomiting, abdominal pain and diarrhea.  Genitourinary: Negative.  Negative for dysuria.  Musculoskeletal: Positive for myalgias.  Neurological: Positive for headaches.    Allergies  Caffeine; Omeprazole; Peanuts; Penicillins; Seasonal ic; and Lactose intolerance (gi)  Home Medications   Prior to Admission medications   Medication Sig Start Date End Date Taking? Authorizing Provider  naproxen sodium (ALEVE) 220 MG tablet Take 220-440 mg by mouth daily as needed (for pain).   Yes Historical Provider, MD  chlorpheniramine-HYDROcodone (TUSSIONEX PENNKINETIC ER) 10-8 MG/5ML LQCR Take 5 mLs by mouth every 12 (twelve) hours as needed for cough. 09/07/14   Evalee Jefferson, PA-C  levofloxacin (LEVAQUIN) 750 MG tablet Take 1 tablet (750 mg total) by mouth daily. 09/07/14   Evalee Jefferson, PA-C  RABEprazole (ACIPHEX) 20 MG tablet Take 1 tablet (20 mg total) by mouth daily. 08/28/14   Barton Fanny, MD   Triage Vitals: BP 142/73 mmHg  Pulse 99  Temp(Src) 102.7 F (39.3 C) (Oral)  Resp 19  Ht 6'  2.5" (1.892 m)  Wt 290 lb (131.543 kg)  BMI 36.75 kg/m2  SpO2 96% Physical Exam  Constitutional: He is oriented to person, place, and time. He appears well-developed and well-nourished.  HENT:  Head: Normocephalic and atraumatic.  Right Ear: Tympanic membrane and ear canal normal.  Left Ear: Tympanic membrane and ear canal normal.  Nose: No mucosal edema or rhinorrhea.  Mouth/Throat: Uvula is midline, oropharynx is clear and moist and mucous  membranes are normal. No oropharyngeal exudate, posterior oropharyngeal edema, posterior oropharyngeal erythema or tonsillar abscesses.  Eyes: Conjunctivae are normal.  Cardiovascular: Normal rate and normal heart sounds.   Borderline tachycardic.  Pulmonary/Chest: Effort normal. No respiratory distress. He has no decreased breath sounds. He has no wheezes. He has rhonchi in the left middle field. He has no rales.  Abdominal: Soft. Bowel sounds are normal. There is no tenderness. There is no guarding.  Musculoskeletal: Normal range of motion.  Neurological: He is alert and oriented to person, place, and time.  Skin: Skin is warm. No rash noted. He is diaphoretic.  Psychiatric: He has a normal mood and affect.    ED Course  Procedures (including critical care time) DIAGNOSTIC STUDIES: Oxygen Saturation is 96% on RA, adequate by my interpretation.   COORDINATION OF CARE: 4:06 PM- Will order CXR and urinalysis. Pt verbalizes understanding and agrees to plan.  Medications  acetaminophen (TYLENOL) tablet 1,000 mg (1,000 mg Oral Given 09/07/14 1518)  levofloxacin (LEVAQUIN) tablet 750 mg (750 mg Oral Given 09/07/14 1743)  albuterol (PROVENTIL HFA;VENTOLIN HFA) 108 (90 BASE) MCG/ACT inhaler 2 puff (2 puffs Inhalation Given 09/07/14 1803)   Labs Review Labs Reviewed  URINALYSIS, ROUTINE W REFLEX MICROSCOPIC  INFLUENZA PANEL BY PCR (TYPE A & B, H1N1)    Imaging Review Dg Chest 2 View  09/07/2014   CLINICAL DATA:  Diffuse myalgias. Mildly productive cough for the past 3 days. Fever and chills. Headache. Mild nausea. Decreased appetite.  EXAM: CHEST  2 VIEW  COMPARISON:  12/05/2011.  FINDINGS: Normal sized heart. Interval wedge-shaped area of increased density extending from the left hilum superiorly. There is also linear density extending from the left hilum inferiorly. Clear right lung.  IMPRESSION: Left perihilar atelectasis and possible pneumonia. Followup chest radiographs are recommended  two weeks following completion of antibiotics to ensure clearing.   Electronically Signed   By: Enrique Sack M.D.   On: 09/07/2014 17:06     EKG Interpretation None      MDM   Final diagnoses:  Fever  Community acquired pneumonia    Patients labs and/or radiological studies were viewed and considered during the medical decision making and disposition process. Pt was placed on levaquin,  Prescribed tussionex, given albuterol mdi. Encouraged rest, increased fluid intake, tylenol/motrin for fever reduction.  Advised close f/u here or with pcp for any worsened sx including weakness, uncontrolled fever, sob.  Work note given. Advised he needs f/u cxr 2 weeks after abx completely to ensure resolution. Pt's fever responded appropriately to tylenol given and he felt better at time of dc.  Discussed with Dr. Leonides Schanz prior to dc home.   I personally performed the services described in this documentation, which was scribed in my presence. The recorded information has been reviewed and is accurate.    Evalee Jefferson, PA-C 09/08/14 [place], DO 09/10/14 1025

## 2014-09-07 NOTE — ED Notes (Signed)
Patient with no complaints at this time. Respirations even and unlabored. Skin warm/dry. Discharge instructions reviewed with patient at this time. Patient given opportunity to voice concerns/ask questions. Patient discharged at this time and left Emergency Department with steady gait.   

## 2014-09-07 NOTE — Discharge Instructions (Signed)
Pneumonia Pneumonia is an infection of the lungs.  CAUSES Pneumonia may be caused by bacteria or a virus. Usually, these infections are caused by breathing infectious particles into the lungs (respiratory tract). SIGNS AND SYMPTOMS   Cough.  Fever.  Chest pain.  Increased rate of breathing.  Wheezing.  Mucus production. DIAGNOSIS  If you have the common symptoms of pneumonia, your health care provider will typically confirm the diagnosis with a chest X-ray. The X-ray will show an abnormality in the lung (pulmonary infiltrate) if you have pneumonia. Other tests of your blood, urine, or sputum may be done to find the specific cause of your pneumonia. Your health care provider may also do tests (blood gases or pulse oximetry) to see how well your lungs are working. TREATMENT  Some forms of pneumonia may be spread to other people when you cough or sneeze. You may be asked to wear a mask before and during your exam. Pneumonia that is caused by bacteria is treated with antibiotic medicine. Pneumonia that is caused by the influenza virus may be treated with an antiviral medicine. Most other viral infections must run their course. These infections will not respond to antibiotics.  HOME CARE INSTRUCTIONS   Cough suppressants may be used if you are losing too much rest. However, coughing protects you by clearing your lungs. You should avoid using cough suppressants if you can.  Your health care provider may have prescribed medicine if he or she thinks your pneumonia is caused by bacteria or influenza. Finish your medicine even if you start to feel better.  Your health care provider may also prescribe an expectorant. This loosens the mucus to be coughed up.  Take medicines only as directed by your health care provider.  Do not smoke. Smoking is a common cause of bronchitis and can contribute to pneumonia. If you are a smoker and continue to smoke, your cough may last several weeks after your  pneumonia has cleared.  A cold steam vaporizer or humidifier in your room or home may help loosen mucus.  Coughing is often worse at night. Sleeping in a semi-upright position in a recliner or using a couple pillows under your head will help with this.  Get rest as you feel it is needed. Your body will usually let you know when you need to rest. PREVENTION A pneumococcal shot (vaccine) is available to prevent a common bacterial cause of pneumonia. This is usually suggested for:  People over 65 years old.  Patients on chemotherapy.  People with chronic lung problems, such as bronchitis or emphysema.  People with immune system problems. If you are over 65 or have a high risk condition, you may receive the pneumococcal vaccine if you have not received it before. In some countries, a routine influenza vaccine is also recommended. This vaccine can help prevent some cases of pneumonia.You may be offered the influenza vaccine as part of your care. If you smoke, it is time to quit. You may receive instructions on how to stop smoking. Your health care provider can provide medicines and counseling to help you quit. SEEK MEDICAL CARE IF: You have a fever. SEEK IMMEDIATE MEDICAL CARE IF:   Your illness becomes worse. This is especially true if you are elderly or weakened from any other disease.  You cannot control your cough with suppressants and are losing sleep.  You begin coughing up blood.  You develop pain which is getting worse or is uncontrolled with medicines.  Any of the symptoms   which initially brought you in for treatment are getting worse rather than better.  You develop shortness of breath or chest pain. MAKE SURE YOU:   Understand these instructions.  Will watch your condition.  Will get help right away if you are not doing well or get worse. Document Released: 09/26/2005 Document Revised: 02/10/2014 Document Reviewed: 12/16/2010 [facility] Patient Information 2015  [place], [place]. This information is not intended to replace advice given to you by your health care provider. Make sure you discuss any questions you have with your health care provider.   Take your next dose of the levaquin antibiotic tomorrow with dinner.  Use the inhaler given - 2 puffs every 4 hours if you are coughing or feel short of breath - this can open your airways and help your cough be more productive.  Rest,  Make sure you are drinking plenty of fluids.  Return here or call your doctor if you develop increasing shortness of breath,  Fever not controlled by tylenol or motrin, weakness or any new symptom.  You should have a repeat xray 2 weeks after the antibiotic is completed to make sure your infection is resolved.  Call your doctor for this.  If you continue to have fevers, feel short of breath or weak beyond Wednesday,  Call your doctor for a recheck and for consideration of extending your work note.

## 2014-09-10 ENCOUNTER — Ambulatory Visit (INDEPENDENT_AMBULATORY_CARE_PROVIDER_SITE_OTHER): Payer: Managed Care, Other (non HMO) | Admitting: Family Medicine

## 2014-09-10 VITALS — BP 114/82 | HR 79 | Temp 98.2°F | Resp 18 | Ht 74.0 in | Wt 286.0 lb

## 2014-09-10 DIAGNOSIS — M791 Myalgia, unspecified site: Secondary | ICD-10-CM

## 2014-09-10 DIAGNOSIS — M79601 Pain in right arm: Secondary | ICD-10-CM

## 2014-09-10 DIAGNOSIS — M542 Cervicalgia: Secondary | ICD-10-CM

## 2014-09-10 DIAGNOSIS — J189 Pneumonia, unspecified organism: Secondary | ICD-10-CM

## 2014-09-10 NOTE — Patient Instructions (Signed)
Over the counter mucinex or mucinex DM, continue levofloxacin for pneumonia. Plan on repeat chest xray in 4-6 weeks to make sure the pneumonia has completely resolved, but return to clinic if worsening prior to that time, or not continuing to improve over next week to 10 days.   Ibuprofen for body aches and neck pain - see neck care manual.  Occasionally arthritis in neck can also cause your arm symptoms, so ibuprofen may help this as well. Return to the clinic or go to the nearest emergency room if any of your symptoms worsen or new symptoms occur.  Pneumonia Pneumonia is an infection of the lungs.  CAUSES Pneumonia may be caused by bacteria or a virus. Usually, these infections are caused by breathing infectious particles into the lungs (respiratory tract). SIGNS AND SYMPTOMS   Cough.  Fever.  Chest pain.  Increased rate of breathing.  Wheezing.  Mucus production. DIAGNOSIS  If you have the common symptoms of pneumonia, your health care provider will typically confirm the diagnosis with a chest X-ray. The X-ray will show an abnormality in the lung (pulmonary infiltrate) if you have pneumonia. Other tests of your blood, urine, or sputum may be done to find the specific cause of your pneumonia. Your health care provider may also do tests (blood gases or pulse oximetry) to see how well your lungs are working. TREATMENT  Some forms of pneumonia may be spread to other people when you cough or sneeze. You may be asked to wear a mask before and during your exam. Pneumonia that is caused by bacteria is treated with antibiotic medicine. Pneumonia that is caused by the influenza virus may be treated with an antiviral medicine. Most other viral infections must run their course. These infections will not respond to antibiotics.  HOME CARE INSTRUCTIONS   Cough suppressants may be used if you are losing too much rest. However, coughing protects you by clearing your lungs. You should avoid using cough  suppressants if you can.  Your health care provider may have prescribed medicine if he or she thinks your pneumonia is caused by bacteria or influenza. Finish your medicine even if you start to feel better.  Your health care provider may also prescribe an expectorant. This loosens the mucus to be coughed up.  Take medicines only as directed by your health care provider.  Do not smoke. Smoking is a common cause of bronchitis and can contribute to pneumonia. If you are a smoker and continue to smoke, your cough may last several weeks after your pneumonia has cleared.  A cold steam vaporizer or humidifier in your room or home may help loosen mucus.  Coughing is often worse at night. Sleeping in a semi-upright position in a recliner or using a couple pillows under your head will help with this.  Get rest as you feel it is needed. Your body will usually let you know when you need to rest. PREVENTION A pneumococcal shot (vaccine) is available to prevent a common bacterial cause of pneumonia. This is usually suggested for:  People over 59 years old.  Patients on chemotherapy.  People with chronic lung problems, such as bronchitis or emphysema.  People with immune system problems. If you are over 65 or have a high risk condition, you may receive the pneumococcal vaccine if you have not received it before. In some countries, a routine influenza vaccine is also recommended. This vaccine can help prevent some cases of pneumonia.You may be offered the influenza vaccine as part  of your care. If you smoke, it is time to quit. You may receive instructions on how to stop smoking. Your health care provider can provide medicines and counseling to help you quit. SEEK MEDICAL CARE IF: You have a fever. SEEK IMMEDIATE MEDICAL CARE IF:   Your illness becomes worse. This is especially true if you are elderly or weakened from any other disease.  You cannot control your cough with suppressants and are  losing sleep.  You begin coughing up blood.  You develop pain which is getting worse or is uncontrolled with medicines.  Any of the symptoms which initially brought you in for treatment are getting worse rather than better.  You develop shortness of breath or chest pain. MAKE SURE YOU:   Understand these instructions.  Will watch your condition.  Will get help right away if you are not doing well or get worse. Document Released: 09/26/2005 Document Revised: 02/10/2014 Document Reviewed: 12/16/2010 [facility] Patient Information 2015 [place], [place]. This information is not intended to replace advice given to you by your health care provider. Make sure you discuss any questions you have with your health care provider.

## 2014-09-10 NOTE — Progress Notes (Addendum)
This chart was scribed for Merri Ray, MD by Edison Simon, ED Scribe. This patient was seen in room 13 and the patient's care was started at 3:43 PM.   Note authored by Janeann Forehand, MD- unable to change in [employer].   Subjective:    Patient ID: Francis Lin, male    DOB: 03-Jun-1976, 38 y.o.   MRN: [id number]  Chief Complaint  Patient presents with  . Neck Pain    feels as though it is related to pneumonia  . Back Pain  . Pneumonia    pt is here for a follow up regarding pneumonia from ER visit at [facility]    HPI  HPI Comments: This patient was seen 3 days ago at [facility] ED. He had body aches at that time with cough for 3 days with associated fever, chills, and headache with possible sick contacts at work. He did not receive a flu vaccine this year. His temp was 102.7 at ED with normal oxygen at 96%. He had a chest x-ray that had left perihilar atelectasis and possible pneumonia. He was started on Levaquin, Tussionex, and Albuterol inhaler if needed. His flu test in the ED was negative and had a normal UA.   Francis Lin is a 38 y.o. male who presents to the [facility] for follow up for pneumonia. He states he is feeling better and states his fever resolved yesterday, though he states he still intermittently feels warm. He states his cough has worsened since his visit to the ED. He states he did not get his Tussionex filled and has been sleeping fine without it. He states he woke this morning with pain to the muscles of his neck. He states that his right elbow and hand felt stiff, numb, and cramping previously. He states that he has had some chest pain, worse with coughing and lying down. He denies SOB. He states he has not been back to work, but plans to return tomorrow morning. 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  PCP: Ellsworth Lennox, MD  Patient Active Problem List   Diagnosis Date Noted    . Situational anxiety 08/28/2014  . Unspecified vitamin D deficiency 09/22/2013  . Shifting sleep-work schedule, affecting sleep 06/20/2013  . Snoring disorder 06/20/2013  . Hypersomnia, persistent   . History of esophageal stricture 04/16/2013  . Pain in joint, ankle and foot 04/16/2013  . Gout 04/16/2013  . OBESITY, CLASS II, BMI 35- 39.9 03/15/2012  . Hypertriglyceridemia 03/15/2012  . CHEST PAIN UNSPECIFIED 12/02/2008  . Dysphagia 12/02/2008   Past Medical History  Diagnosis Date  . IBS (irritable bowel syndrome)   . Esophagitis   . Acid reflux   . Hypersomnia, persistent   . Shifting sleep-work schedule, affecting sleep 06/20/2013  . 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 No pertinent past surgical history. Allergies  Allergen Reactions  . Caffeine Other (See Comments)    "makes feel bad", headaches  . Omeprazole Other (See Comments)    Mood swing  . Peanuts [Peanut Oil]   . Penicillins Other (See Comments)    Headaches   . Seasonal Ic [Cholestatin]     Sneezing,runny nose , itchy eyes  . Lactose Intolerance (Gi)    Prior to Admission medications   Medication Sig Start Date End Date Taking? Authorizing Provider  levofloxacin (LEVAQUIN) 750 MG tablet Take 1 tablet (750 mg total) by mouth daily.  09/07/14  Yes Almyra Free Idol, PA-C  naproxen sodium (ALEVE) 220 MG tablet Take 220-440 mg by mouth daily as needed (for pain).   Yes Historical Provider, MD  RABEprazole (ACIPHEX) 20 MG tablet Take 1 tablet (20 mg total) by mouth daily. 08/28/14  Yes Barton Fanny, MD  chlorpheniramine-HYDROcodone [facility] PENNKINETIC ER) 10-8 MG/5ML LQCR Take 5 mLs by mouth every 12 (twelve) hours as needed for cough. Patient not taking: Reported on 09/10/2014 09/07/14   Evalee Jefferson, PA-C   History   Social History  . Marital Status: Married    Spouse Name: N/A    Number of Children: 2  . Years of Education: college   Occupational History  .  [name]    [employer]   Social History Main Topics  . Smoking status: Never Smoker   . Smokeless tobacco: Never Used  . Alcohol Use: No  . Drug Use: No  . Sexual Activity: Not on file   Other Topics Concern  . Not on file   Social History Narrative      Review of Systems  Constitutional: Negative for fever.  Respiratory: Positive for cough. Negative for shortness of breath.   Cardiovascular: Positive for chest pain.  Musculoskeletal: Positive for neck pain.  Psychiatric/Behavioral: Negative for sleep disturbance.       Objective:   Physical Exam  Constitutional: He is oriented to person, place, and time. He appears well-developed and well-nourished.  HENT:  Head: Normocephalic and atraumatic.  Right Ear: Tympanic membrane, external ear and ear canal normal.  Left Ear: Tympanic membrane, external ear and ear canal normal.  Nose: No rhinorrhea.  Mouth/Throat: Oropharynx is clear and moist and mucous membranes are normal. No oropharyngeal exudate or posterior oropharyngeal erythema.  Eyes: Conjunctivae are normal. Pupils are equal, round, and reactive to light.  Neck: Neck supple.  Cardiovascular: Normal rate, regular rhythm, normal heart sounds and intact distal pulses.   No murmur heard. Pulmonary/Chest: Effort normal and breath sounds normal. He has no wheezes. He has no rhonchi. He has no rales.  Abdominal: Soft. There is no tenderness.  Musculoskeletal:  Slight tenderness to proximal C-7 perispinal muscles of the neck Slightly guarded but has near complete ROM with exception of slight decreased distension and left lateral flexion Right elbow has full ROM Full strength with biceps and triceps testing Hand veins are flat with arms elevated No swelling of arm noted Wrist ROM normal Equal grip strength bilaterally NVI distally with cap refill <1 second  Lymphadenopathy:    He has no cervical adenopathy.  Neurological: He is alert and oriented to person, place, and time. He  displays normal reflexes.  Reflex Scores:      Tricep reflexes are 2+ on the right side and 2+ on the left side.      Bicep reflexes are 2+ on the right side and 2+ on the left side.      Brachioradialis reflexes are 2+ on the right side and 2+ on the left side. Skin: Skin is warm and dry. No rash noted.  Psychiatric: He has a normal mood and affect. His behavior is normal.  Vitals reviewed.   Filed Vitals:   09/10/14 1509  BP: 114/82  Pulse: 79  Temp: 98.2 F (36.8 C)  TempSrc: Oral  Resp: 18  Height: 6\' 2"  (1.88 m)  Weight: 286 lb (129.729 kg)  SpO2: 98%       Assessment & Plan:  Francis Lin is a 38 y.o.  male CAP (community acquired pneumonia)  - improved. Tolerating antibiotic, oxygen saturation okay, and clinically appears improved. Continue Levaquin, samples of Mucinex if needed throughout day. Plan on repeat chest x-ray in 4-6 weeks. If not dyspneic, and continues to improve, can return to work in 2 days, Otherwise return to clinic if worsening.  Neck pain, Myalgia, right arm pain  -may be due to concurrent illness with myalgias but also possible degenerative disease of neck with flare that may also cause episodic dysesthesias in right arm. Strength and reflexes intact. If this returns to normal by Friday, he can return to work but cautioned on forklift operation if unable to move neck normally. RTC precautions discussed.  No orders of the defined types were placed in this encounter.   Patient Instructions  Over the counter mucinex or mucinex DM, continue levofloxacin for pneumonia. Plan on repeat chest xray in 4-6 weeks to make sure the pneumonia has completely resolved, but return to clinic if worsening prior to that time, or not continuing to improve over next week to 10 days.   Ibuprofen for body aches and neck pain - see neck care manual.  Occasionally arthritis in neck can also cause your arm symptoms, so ibuprofen may help this as well. Return to the clinic or go to  the nearest emergency room if any of your symptoms worsen or new symptoms occur.  Pneumonia Pneumonia is an infection of the lungs.  CAUSES Pneumonia may be caused by bacteria or a virus. Usually, these infections are caused by breathing infectious particles into the lungs (respiratory tract). SIGNS AND SYMPTOMS   Cough.  Fever.  Chest pain.  Increased rate of breathing.  Wheezing.  Mucus production. DIAGNOSIS  If you have the common symptoms of pneumonia, your health care provider will typically confirm the diagnosis with a chest X-ray. The X-ray will show an abnormality in the lung (pulmonary infiltrate) if you have pneumonia. Other tests of your blood, urine, or sputum may be done to find the specific cause of your pneumonia. Your health care provider may also do tests (blood gases or pulse oximetry) to see how well your lungs are working. TREATMENT  Some forms of pneumonia may be spread to other people when you cough or sneeze. You may be asked to wear a mask before and during your exam. Pneumonia that is caused by bacteria is treated with antibiotic medicine. Pneumonia that is caused by the influenza virus may be treated with an antiviral medicine. Most other viral infections must run their course. These infections will not respond to antibiotics.  HOME CARE INSTRUCTIONS   Cough suppressants may be used if you are losing too much rest. However, coughing protects you by clearing your lungs. You should avoid using cough suppressants if you can.  Your health care provider may have prescribed medicine if he or she thinks your pneumonia is caused by bacteria or influenza. Finish your medicine even if you start to feel better.  Your health care provider may also prescribe an expectorant. This loosens the mucus to be coughed up.  Take medicines only as directed by your health care provider.  Do not smoke. Smoking is a common cause of bronchitis and can contribute to pneumonia. If you  are a smoker and continue to smoke, your cough may last several weeks after your pneumonia has cleared.  A cold steam vaporizer or humidifier in your room or home may help loosen mucus.  Coughing is often worse at night. Sleeping in a  semi-upright position in a recliner or using a couple pillows under your head will help with this.  Get rest as you feel it is needed. Your body will usually let you know when you need to rest. PREVENTION A pneumococcal shot (vaccine) is available to prevent a common bacterial cause of pneumonia. This is usually suggested for:  People over 44 years old.  Patients on chemotherapy.  People with chronic lung problems, such as bronchitis or emphysema.  People with immune system problems. If you are over 65 or have a high risk condition, you may receive the pneumococcal vaccine if you have not received it before. In some countries, a routine influenza vaccine is also recommended. This vaccine can help prevent some cases of pneumonia.You may be offered the influenza vaccine as part of your care. If you smoke, it is time to quit. You may receive instructions on how to stop smoking. Your health care provider can provide medicines and counseling to help you quit. SEEK MEDICAL CARE IF: You have a fever. SEEK IMMEDIATE MEDICAL CARE IF:   Your illness becomes worse. This is especially true if you are elderly or weakened from any other disease.  You cannot control your cough with suppressants and are losing sleep.  You begin coughing up blood.  You develop pain which is getting worse or is uncontrolled with medicines.  Any of the symptoms which initially brought you in for treatment are getting worse rather than better.  You develop shortness of breath or chest pain. MAKE SURE YOU:   Understand these instructions.  Will watch your condition.  Will get help right away if you are not doing well or get worse. Document Released: 09/26/2005 Document Revised:  02/10/2014 Document Reviewed: 12/16/2010 [facility] Patient Information 2015 [place], [place]. This information is not intended to replace advice given to you by your health care provider. Make sure you discuss any questions you have with your health care provider.      I personally performed the services described in this documentation, which was scribed in my presence. The recorded information has been reviewed and considered, and addended by me as needed.

## 2014-10-17 ENCOUNTER — Encounter: Payer: Self-pay | Admitting: Family Medicine

## 2014-10-17 ENCOUNTER — Ambulatory Visit (INDEPENDENT_AMBULATORY_CARE_PROVIDER_SITE_OTHER): Payer: Managed Care, Other (non HMO)

## 2014-10-17 ENCOUNTER — Ambulatory Visit (INDEPENDENT_AMBULATORY_CARE_PROVIDER_SITE_OTHER): Payer: Managed Care, Other (non HMO) | Admitting: Family Medicine

## 2014-10-17 VITALS — BP 112/78 | HR 80 | Temp 98.0°F | Resp 18 | Ht 74.5 in | Wt 294.2 lb

## 2014-10-17 DIAGNOSIS — J40 Bronchitis, not specified as acute or chronic: Secondary | ICD-10-CM

## 2014-10-17 DIAGNOSIS — Z8701 Personal history of pneumonia (recurrent): Secondary | ICD-10-CM

## 2014-10-17 DIAGNOSIS — K219 Gastro-esophageal reflux disease without esophagitis: Secondary | ICD-10-CM

## 2014-10-17 MED ORDER — LEVOFLOXACIN 500 MG PO TABS: 500.0000 mg | ORAL_TABLET | Freq: Every day | ORAL | Status: DC

## 2014-10-17 NOTE — Progress Notes (Signed)
Subjective:    Patient ID: Francis Lin, male    DOB: 07/24/1976, 39 y.o.   MRN: [id number]  HPI  Pt is a 39 y.o. Male who was diagnosed w/ pneumonia on 09/07/14. He completed 5-day course of Levaquin. OTC products used: Thera-Flu and cough lozenges. He c/o prod cough, sinus congestion and mild SOB. He has continued to work during this illness.  Denies fever/chills, anorexia, pleuritic CP, n/v/d, myalgias, rash, weakness or syncope.  GERD- pt did not fill prescription for AcipHex (not on formulary). Did not tolerate omeprazole in the past (mood swings). Discussed Barium swallow at last visit and this test was ordered. Pt states he has not been contacted w/ an appt for this test.   Patient Active Problem List   Diagnosis Date Noted  . Situational anxiety 08/28/2014  . Unspecified vitamin D deficiency 09/22/2013  . Shifting sleep-work schedule, affecting sleep 06/20/2013  . Snoring disorder 06/20/2013  . Hypersomnia, persistent   . History of esophageal stricture 04/16/2013  . Pain in joint, ankle and foot 04/16/2013  . Gout 04/16/2013  . OBESITY, CLASS II, BMI 35- 39.9 03/15/2012  . Hypertriglyceridemia 03/15/2012  . CHEST PAIN UNSPECIFIED 12/02/2008  . Dysphagia 12/02/2008    Prior to Admission medications   Medication Sig Start Date End Date Taking? Authorizing Provider  naproxen sodium (ALEVE) 220 MG tablet Take 220-440 mg by mouth daily as needed (for pain).   Yes Historical Provider, MD  levofloxacin (LEVAQUIN) 500 MG tablet Take 1 tablet (500 mg total) by mouth daily.   No     History   Social History  . Marital Status: Married    Spouse Name: N/A    Number of Children: 2  . Years of Education: college   Occupational History  .  Francis [employer]    dis[employer]   Social History Main Topics  . Smoking status: Never Smoker   . Smokeless tobacco: Never Used  . Alcohol Use: No  . Drug Use: No  . Sexual Activity: Not on file   Other Topics Concern  . Not on  file   Social History Narrative     Review of Systems  Constitutional: Negative for fever, chills, diaphoresis, activity change and appetite change.  HENT: Positive for congestion, rhinorrhea and sore throat. Negative for ear pain, facial swelling, postnasal drip, sinus pressure, trouble swallowing and voice change.   Eyes: Negative.   Respiratory: Positive for cough. Negative for choking, chest tightness, shortness of breath and wheezing.   Cardiovascular: Negative.   Gastrointestinal: Negative.   Skin: Negative.   Neurological: Negative.       Objective:   Physical Exam  Constitutional: He is oriented to person, place, and time. Vital signs are normal. He appears well-developed and well-nourished. No distress.  HENT:  Head: Normocephalic and atraumatic.  Right Ear: Hearing, tympanic membrane, external ear and ear canal normal.  Left Ear: Hearing, tympanic membrane, external ear and ear canal normal.  Nose: Mucosal edema present. No sinus tenderness or nasal deformity. Right sinus exhibits no maxillary sinus tenderness and no frontal sinus tenderness. Left sinus exhibits no maxillary sinus tenderness and no frontal sinus tenderness.  Mouth/Throat: Uvula is midline and mucous membranes are normal. No oral lesions. No uvula swelling or dental caries. Posterior oropharyngeal erythema present. No oropharyngeal exudate.  Eyes: EOM and lids are normal. Pupils are equal, round, and reactive to light. Right conjunctiva is injected. Left conjunctiva is injected. No scleral icterus.  Neck: Trachea normal,  normal range of motion and phonation normal. Neck supple. No spinous process tenderness and no muscular tenderness present.  Cardiovascular: Normal rate, regular rhythm and normal heart sounds.   Pulmonary/Chest: Effort normal and breath sounds normal. No respiratory distress. He has no wheezes. He has no rales.  Musculoskeletal: Normal range of motion.  Lymphadenopathy:    He has no cervical  adenopathy.  Neurological: He is alert and oriented to person, place, and time. No cranial nerve deficit. Coordination normal.  Skin: Skin is warm and dry. No rash noted. He is not diaphoretic. No erythema.  Nursing note and vitals reviewed.   [facility] reading (PRIMARY) by  Dr. Leward Quan: CXR- Patchy infiltrate seen on 09/07/2014 is less apparent and shows slow resolution. Normal heart size.     Assessment & Plan:  History of pneumonia - Symptoms linger after completion of antibiotic. Plan: DG Chest 2 View  Bronchitis- Repeat Levaquin x 7 days. Mucinex- DM and fluids.  Gastroesophageal reflux disease, esophagitis presence not specified- Pt given samples of Nexium 24-hr capsules, 1 capsule daily, to see if he tolerates this PPI. If no problem w/ Nexium, this med will be prescribed for GERD.  Barium swallow reordered.

## 2014-10-17 NOTE — Patient Instructions (Addendum)
Your chest xray still shows some areas consistent with clearing infection. I am prescribing another 7 days of Levaquin. Also, take Mucinex-DM to help break up the chest congestion and quiet the cough. Drink plenty of liquids to help thin out your mucous.  I will re-order the Barium swallow; the record shows it was ordered but no other information is in the record (appointment date or time). I have given you some NEXIUM samples. Take 1 capsule once a day, 30 minutes before a meal or snack. It may take about 1 week before you notice a change. If this medication works for you without side effects, contact the clinic and the medication can be prescribed for you.

## 2014-10-19 ENCOUNTER — Encounter: Payer: Self-pay | Admitting: Family Medicine

## 2014-11-14 ENCOUNTER — Encounter: Payer: Self-pay | Admitting: Family Medicine

## 2014-11-14 ENCOUNTER — Ambulatory Visit (INDEPENDENT_AMBULATORY_CARE_PROVIDER_SITE_OTHER): Payer: Managed Care, Other (non HMO) | Admitting: Family Medicine

## 2014-11-14 VITALS — BP 120/60 | HR 75 | Temp 98.2°F | Resp 16 | Ht 74.5 in | Wt 295.4 lb

## 2014-11-14 DIAGNOSIS — Z8701 Personal history of pneumonia (recurrent): Secondary | ICD-10-CM

## 2014-11-14 DIAGNOSIS — K219 Gastro-esophageal reflux disease without esophagitis: Secondary | ICD-10-CM

## 2014-11-14 NOTE — Patient Instructions (Addendum)
I will contact our referral pool and ask that they follow-up on the upper GI study and contact you with some information and/or appointment.  Once you have had that study, I will be in touch to discuss results and further treatment or evaluation as warranted.

## 2014-11-16 ENCOUNTER — Encounter: Payer: Self-pay | Admitting: Family Medicine

## 2014-11-16 NOTE — Progress Notes (Signed)
S;  This 39 y.o. Male returns for follow-up after treatment for pneumonia/ bronchitis. He completed antibiotics and is ab=febrile. He has mild NP cough but feels well. Pt has hx of GERD; at last visit, he was given samples of Nexium to try given his hx of side effect w/ omeprazole. His insurer will not cover pt's preferred PPI (AcipHex) unless he has tried another PPI. Pt did not take Nexium He has been referred for UGI study but has not been called w/ an appt. He does not report worsening symptoms.   Patient Active Problem List   Diagnosis Date Noted  . Situational anxiety 08/28/2014  . Unspecified vitamin D deficiency 09/22/2013  . Shifting sleep-work schedule, affecting sleep 06/20/2013  . Snoring disorder 06/20/2013  . Hypersomnia, persistent   . History of esophageal stricture 04/16/2013  . Pain in joint, ankle and foot 04/16/2013  . Gout 04/16/2013  . OBESITY, CLASS II, BMI 35- 39.9 03/15/2012  . Hypertriglyceridemia 03/15/2012  . CHEST PAIN UNSPECIFIED 12/02/2008  . Dysphagia 12/02/2008   Prior to Admission medications   Medication Sig Start Date End Date Taking? Authorizing Provider  naproxen sodium (ALEVE) 220 MG tablet Take 220-440 mg by mouth daily as needed (for pain).   Yes Historical Provider, MD    History reviewed. No pertinent past surgical history.  ROS: As per HPI; mild dysphagia persists but no choking, sore throat, weight loss, anorexia, hematochezia or melena. He does not report fatigue (other than as it relates to working 2 jobs), weakness or palpitations.  O: Filed Vitals:   11/14/14 1307  BP: 120/60  Pulse: 75  Temp: 98.2 F (36.8 C)  Resp: 16   GEN: in NAD; WN,WD. Pt ism obese w/ BMI= 37.42 HENT: [place]/AT; EOMI w/ clear conj/sclerae; normal oral mucosa. COR: RRR. LUNGS: CTA; normal resp rate and effort. ABD: Normal BS, soft and NT w/o guarding. No HSM or masses. SKIN: W&D; intact. NEURO: A&O x 3; CNs intact. Nonfocal.  A/P: Gastroesophageal reflux  disease without esophagitis- Pt agrees to try Nexium. He will be contacted w/ appt for UGI study.  History of pneumonia- Resolved.

## 2014-11-20 ENCOUNTER — Ambulatory Visit
Admission: RE | Admit: 2014-11-20 | Discharge: 2014-11-20 | Disposition: A | Discharge status: Home / Self Care | Payer: Managed Care, Other (non HMO) | Source: Ambulatory Visit | Attending: Family Medicine | Admitting: Family Medicine

## 2014-11-20 ENCOUNTER — Telehealth: Payer: Self-pay

## 2014-11-20 DIAGNOSIS — K219 Gastro-esophageal reflux disease without esophagitis: Secondary | ICD-10-CM

## 2014-11-20 NOTE — Progress Notes (Signed)
Quick Note:  Your recent imaging study of the esophagus is normal. No reflux was observed.   Contact the clinic if your symptoms persists or change. ______

## 2014-11-20 NOTE — Telephone Encounter (Signed)
Pt called wanting to know the status of his results from his OV today at [place]. Please advise at [phone]

## 2014-11-21 NOTE — Telephone Encounter (Signed)
Pt was called on 11/21/2014 by staff member who left a detailed message  >> normal result.

## 2014-12-18 ENCOUNTER — Telehealth: Payer: Self-pay

## 2014-12-18 NOTE — Telephone Encounter (Signed)
The patient came into office to schedule CPE, but also stated that his face is intermittently breaking out. The patient requests return call to discuss symptoms and to get advice on how to treat.  The patient may be reached at [phone]

## 2014-12-19 NOTE — Telephone Encounter (Signed)
Spoke with pt, advised to RTC to address issue.

## 2015-02-24 ENCOUNTER — Encounter: Payer: Self-pay | Admitting: Gastroenterology

## 2015-04-09 ENCOUNTER — Ambulatory Visit (INDEPENDENT_AMBULATORY_CARE_PROVIDER_SITE_OTHER): Payer: Managed Care, Other (non HMO) | Admitting: Physician Assistant

## 2015-04-09 VITALS — BP 130/80 | HR 68 | Temp 98.7°F | Resp 16 | Ht 76.0 in | Wt 310.0 lb

## 2015-04-09 DIAGNOSIS — T7840XA Allergy, unspecified, initial encounter: Secondary | ICD-10-CM

## 2015-04-09 MED ORDER — HYDROXYZINE HCL 25 MG PO TABS: 12.5000 mg | ORAL_TABLET | Freq: Three times a day (TID) | ORAL | Status: DC | PRN

## 2015-04-09 MED ORDER — CETIRIZINE HCL 10 MG PO TABS: 10.0000 mg | ORAL_TABLET | Freq: Every day | ORAL | Status: DC

## 2015-04-09 NOTE — Progress Notes (Signed)
   Subjective:    Patient ID: Francis Lin, male    DOB: 03-15-1976, 39 y.o.   MRN: [id number]  HPI Patient presents for rash on forehead that has been present for past 2 days. Rash itches and can be painful. Denies SOB or wheezing. Has a bunch of red bumps that seem to be spreading. Has used to new body washes/facial washes in the past 2 weeks. 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 Denies new detergents or eating any new foods. Wears hat throughout day. Bumps itch worse with sweating. Med allergy to PCN and omeprazole.   Review of Systems As noted above.    Objective:   Physical Exam  Constitutional: He is oriented to person, place, and time. He appears well-developed and well-nourished. No distress.  Blood pressure 130/80, pulse 68, temperature 98.7 F (37.1 C), temperature source Oral, resp. rate 16, height 6\' 4"  (1.93 m), weight 310 lb (140.615 kg), SpO2 98 %.  HENT:  Head: Normocephalic and atraumatic.  Right Ear: External ear normal.  Left Ear: External ear normal.  Eyes: Conjunctivae are normal. Right eye exhibits no discharge. Left eye exhibits no discharge. No scleral icterus.  Cardiovascular: Normal rate, regular rhythm and normal heart sounds.  Exam reveals no gallop and no friction rub.   No murmur heard. Pulmonary/Chest: Effort normal and breath sounds normal. No respiratory distress. He has no wheezes. He has no rales.  Lymphadenopathy:    He has no cervical adenopathy.  Neurological: He is alert and oriented to person, place, and time.  Skin: Skin is warm and dry. Rash (fine hives across forehead, scalp, cheeks, and back of neck.) noted. He is not diaphoretic. No erythema. No pallor.  Psychiatric: He has a normal mood and affect. His behavior is normal. Judgment and thought content normal.       Assessment & Plan:  1. Allergic reaction, initial encounter - hydrOXYzine (ATARAX/VISTARIL) 25 MG tablet; Take 0.5-1 tablets (12.5-25 mg total) by mouth every 8 (eight)  hours as needed for itching.  Dispense: 30 tablet; Refill: 0 - cetirizine (ZYRTEC) 10 MG tablet; Take 1 tablet (10 mg total) by mouth daily.  Dispense: 30 tablet; Refill: [place] PA-C  [facility] 04/09/2015 2:43 PM

## 2015-04-09 NOTE — Patient Instructions (Signed)
Hydroxyzine before bed and cetirizine when wake up.

## 2015-05-01 ENCOUNTER — Encounter: Payer: Managed Care, Other (non HMO) | Admitting: Family Medicine

## 2016-01-11 ENCOUNTER — Other Ambulatory Visit: Payer: Self-pay | Admitting: Family Medicine

## 2016-01-11 DIAGNOSIS — M109 Gout, unspecified: Secondary | ICD-10-CM

## 2016-01-11 MED ORDER — COLCHICINE 0.6 MG PO TABS: 0.6000 mg | ORAL_TABLET | ORAL | Status: DC | PRN

## 2016-01-12 ENCOUNTER — Encounter (HOSPITAL_BASED_OUTPATIENT_CLINIC_OR_DEPARTMENT_OTHER): Payer: Self-pay

## 2016-01-12 ENCOUNTER — Emergency Department (HOSPITAL_BASED_OUTPATIENT_CLINIC_OR_DEPARTMENT_OTHER)
Admission: EM | Admit: 2016-01-12 | Discharge: 2016-01-12 | Disposition: A | Discharge status: Home / Self Care | Payer: Managed Care, Other (non HMO) | Attending: Emergency Medicine | Admitting: Emergency Medicine

## 2016-01-12 DIAGNOSIS — Z8719 Personal history of other diseases of the digestive system: Secondary | ICD-10-CM | POA: Insufficient documentation

## 2016-01-12 DIAGNOSIS — Z8669 Personal history of other diseases of the nervous system and sense organs: Secondary | ICD-10-CM | POA: Insufficient documentation

## 2016-01-12 DIAGNOSIS — M10071 Idiopathic gout, right ankle and foot: Secondary | ICD-10-CM | POA: Insufficient documentation

## 2016-01-12 DIAGNOSIS — Z880 Allergy status to penicillin: Secondary | ICD-10-CM | POA: Diagnosis not present

## 2016-01-12 DIAGNOSIS — M109 Gout, unspecified: Secondary | ICD-10-CM

## 2016-01-12 DIAGNOSIS — Z79899 Other long term (current) drug therapy: Secondary | ICD-10-CM | POA: Diagnosis not present

## 2016-01-12 DIAGNOSIS — M79671 Pain in right foot: Secondary | ICD-10-CM | POA: Diagnosis present

## 2016-01-12 MED ORDER — NAPROXEN 500 MG PO TABS: 500.0000 mg | ORAL_TABLET | Freq: Two times a day (BID) | ORAL | Status: DC

## 2016-01-12 MED ORDER — OXYCODONE-ACETAMINOPHEN 5-325 MG PO TABS
1.0000 | ORAL_TABLET | Freq: Once | ORAL | Status: AC
  Administered 2016-01-12: 1 via ORAL
  Filled 2016-01-12: qty 1

## 2016-01-12 MED ORDER — NAPROXEN 250 MG PO TABS
500.0000 mg | ORAL_TABLET | Freq: Once | ORAL | Status: AC
  Administered 2016-01-12: 500 mg via ORAL
  Filled 2016-01-12: qty 2

## 2016-01-12 MED ORDER — OXYCODONE-ACETAMINOPHEN 5-325 MG PO TABS: 1.0000 | ORAL_TABLET | Freq: Four times a day (QID) | ORAL | Status: DC | PRN

## 2016-01-12 NOTE — ED Notes (Signed)
Pt c/o redness, swelling and pain to rt foot since yesterday. States hx of gout but never this bad

## 2016-01-12 NOTE — ED Provider Notes (Signed)
CSN: [id number]     Arrival date & time 01/12/16  0446 History   First MD Initiated Contact with Patient 01/12/16 0505     Chief Complaint  Patient presents with  . Foot Pain     (Consider location/radiation/quality/duration/timing/severity/associated sxs/prior Treatment) HPI  This is a 40 year old male with history of gout who presents with right foot pain. Onset of symptoms yesterday. He noted swelling and pain over his right first metatarsal. His pain is currently 7 out of 10. Worse with ambulation. He has been using Aleve with some improvement. States that his foot appears less edematous and less red. Denies any fevers. Patient states that "my diet may not have been the best." He is not currently taking any preventive gout medications. He has culture seen prescribed but "doesn't want to take it because of side effects."   Past Medical History  Diagnosis Date  . IBS (irritable bowel syndrome)   . Esophagitis   . Acid reflux   . Hypersomnia, persistent   . Shifting sleep-work schedule, affecting sleep 06/20/2013  . 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 No pertinent past surgical history. Family History  Problem Relation Age of Onset  . Edema Sister   . Diabetes Sister   . Bipolar disorder Sister   . Heart attack Father   . Arthritis Father   . Hypertension Father   . Cancer Father     multiple myeloma   Social History  Substance Use Topics  . Smoking status: Never Smoker   . Smokeless tobacco: Never Used  . Alcohol Use: No    Review of Systems  Constitutional: Negative for fever.  Musculoskeletal:       Right great toe pain and foot pain  Skin: Positive for color change.  All other systems reviewed and are negative.     Allergies  Caffeine; Omeprazole; Peanuts; Penicillins; Seasonal ic; and Lactose intolerance (gi)  Home Medications   Prior to Admission medications   Medication Sig Start Date End Date Taking? Authorizing Provider  cetirizine  (ZYRTEC) 10 MG tablet Take 1 tablet (10 mg total) by mouth daily. 04/09/15   Tishira R Brewington, PA-C  colchicine 0.6 MG tablet Take 1 tablet (0.6 mg total) by mouth every 2 (two) hours as needed. 01/11/16   Robyn Haber, MD  hydrOXYzine (ATARAX/VISTARIL) 25 MG tablet Take 0.5-1 tablets (12.5-25 mg total) by mouth every 8 (eight) hours as needed for itching. 04/09/15   Tishira R Brewington, PA-C  naproxen (NAPROSYN) 500 MG tablet Take 1 tablet (500 mg total) by mouth 2 (two) times daily with a meal. 01/12/16   Merryl Hacker, MD  naproxen sodium (ALEVE) 220 MG tablet Take 220-440 mg by mouth daily as needed (for pain).    Historical Provider, MD  oxyCODONE-acetaminophen (PERCOCET/ROXICET) 5-325 MG tablet Take 1-2 tablets by mouth every 6 (six) hours as needed for severe pain. 01/12/16   Merryl Hacker, MD   BP 134/99 mmHg  Pulse 72  Temp(Src) 98.5 F (36.9 C) (Oral)  Resp 18  Ht 6' 2" (1.88 m)  Wt 300 lb (136.079 kg)  BMI 38.50 kg/m2  SpO2 18% Physical Exam  Constitutional: He is oriented to person, place, and time. He appears well-developed and well-nourished. No distress.  HENT:  Head: Normocephalic and atraumatic.  Cardiovascular: Normal rate and regular rhythm.   Pulmonary/Chest: Effort normal. No respiratory distress.  Musculoskeletal:  Focused examination of the right foot reveals tenderness at the right first MTP joint, mild erythema noted,  no significant swelling, limited range of motion secondary to pain, 2+ DP pulse  Neurological: He is alert and oriented to person, place, and time.  Skin: Skin is warm and dry.  Psychiatric: He has a normal mood and affect.  Nursing note and vitals reviewed.   ED Course  Procedures (including critical care time) Labs Review Labs Reviewed - No data to display  Imaging Review No results found. I have personally reviewed and evaluated these images and lab results as part of my medical decision-making.   EKG Interpretation None       MDM   Final diagnoses:  Acute gout of right foot, unspecified cause    Patient presents with right foot and great toe pain. History of physical exam is consistent with gout. Afebrile and no other signs of infection at this time. Patient was given naproxen and Percocet. Discussed with patient diet modifications and continued pain and anti-inflammatory medication at home. He will need to follow-up with his primary physician for gout preventative medications. He was given return precautions regarding fever, worsening swelling, worsening redness.  After history, exam, and medical workup I feel the patient has been appropriately medically screened and is safe for discharge home. Pertinent diagnoses were discussed with the patient. Patient was given return precautions.    Merryl Hacker, MD 01/12/16 [phone]

## 2016-01-12 NOTE — Discharge Instructions (Signed)

## 2016-05-17 DIAGNOSIS — S82401A Unspecified fracture of shaft of right fibula, initial encounter for closed fracture: Secondary | ICD-10-CM | POA: Insufficient documentation

## 2016-05-17 DIAGNOSIS — S82201A Unspecified fracture of shaft of right tibia, initial encounter for closed fracture: Secondary | ICD-10-CM | POA: Insufficient documentation

## 2016-05-17 DIAGNOSIS — T1490XA Injury, unspecified, initial encounter: Secondary | ICD-10-CM | POA: Insufficient documentation

## 2016-05-17 DIAGNOSIS — S82202A Unspecified fracture of shaft of left tibia, initial encounter for closed fracture: Secondary | ICD-10-CM | POA: Insufficient documentation

## 2016-05-17 DIAGNOSIS — R748 Abnormal levels of other serum enzymes: Secondary | ICD-10-CM | POA: Insufficient documentation

## 2016-05-31 DIAGNOSIS — S8292XA Unspecified fracture of left lower leg, initial encounter for closed fracture: Secondary | ICD-10-CM | POA: Insufficient documentation

## 2016-05-31 DIAGNOSIS — S8291XA Unspecified fracture of right lower leg, initial encounter for closed fracture: Secondary | ICD-10-CM | POA: Insufficient documentation

## 2016-06-22 DIAGNOSIS — Z8781 Personal history of (healed) traumatic fracture: Secondary | ICD-10-CM | POA: Insufficient documentation

## 2016-06-22 DIAGNOSIS — Z9889 Other specified postprocedural states: Secondary | ICD-10-CM | POA: Insufficient documentation

## 2016-06-30 DIAGNOSIS — I82431 Acute embolism and thrombosis of right popliteal vein: Secondary | ICD-10-CM | POA: Insufficient documentation

## 2017-01-05 DIAGNOSIS — I872 Venous insufficiency (chronic) (peripheral): Secondary | ICD-10-CM | POA: Insufficient documentation

## 2017-09-30 ENCOUNTER — Encounter (HOSPITAL_BASED_OUTPATIENT_CLINIC_OR_DEPARTMENT_OTHER): Payer: Self-pay | Admitting: Emergency Medicine

## 2017-09-30 ENCOUNTER — Other Ambulatory Visit: Payer: Self-pay

## 2017-09-30 ENCOUNTER — Emergency Department (HOSPITAL_BASED_OUTPATIENT_CLINIC_OR_DEPARTMENT_OTHER): Payer: Managed Care, Other (non HMO)

## 2017-09-30 ENCOUNTER — Emergency Department (HOSPITAL_BASED_OUTPATIENT_CLINIC_OR_DEPARTMENT_OTHER)
Admission: EM | Admit: 2017-09-30 | Discharge: 2017-09-30 | Disposition: A | Discharge status: Home / Self Care | Payer: Managed Care, Other (non HMO) | Attending: Emergency Medicine | Admitting: Emergency Medicine

## 2017-09-30 DIAGNOSIS — M25531 Pain in right wrist: Secondary | ICD-10-CM | POA: Diagnosis not present

## 2017-09-30 DIAGNOSIS — R072 Precordial pain: Secondary | ICD-10-CM | POA: Insufficient documentation

## 2017-09-30 DIAGNOSIS — M7989 Other specified soft tissue disorders: Secondary | ICD-10-CM | POA: Diagnosis not present

## 2017-09-30 DIAGNOSIS — R079 Chest pain, unspecified: Secondary | ICD-10-CM | POA: Diagnosis present

## 2017-09-30 HISTORY — DX: Acute embolism and thrombosis of unspecified deep veins of unspecified lower extremity: I82.409

## 2017-09-30 LAB — BASIC METABOLIC PANEL
Anion gap: 8 (ref 5–15)
BUN: 14 mg/dL (ref 6–20)
CALCIUM: 8.6 mg/dL — AB (ref 8.9–10.3)
CO2: 24 mmol/L (ref 22–32)
CREATININE: 1.48 mg/dL — AB (ref 0.61–1.24)
Chloride: 104 mmol/L (ref 101–111)
GFR calc non Af Amer: 57 mL/min — ABNORMAL LOW (ref >60)
GLUCOSE: 78 mg/dL (ref 65–99)
Potassium: 4 mmol/L (ref 3.5–5.1)
Sodium: 136 mmol/L (ref 135–145)

## 2017-09-30 LAB — CBC WITH DIFFERENTIAL/PLATELET
Basophils Absolute: 0 10*3/uL (ref 0.0–0.1)
Basophils Relative: 0 %
EOS PCT: 2 %
Eosinophils Absolute: 0.2 10*3/uL (ref 0.0–0.7)
HCT: 43.6 % (ref 39.0–52.0)
Hemoglobin: 14.9 g/dL (ref 13.0–17.0)
LYMPHS ABS: 1.8 10*3/uL (ref 0.7–4.0)
Lymphocytes Relative: 19 %
MCH: 29.6 pg (ref 26.0–34.0)
MCHC: 34.2 g/dL (ref 30.0–36.0)
MCV: 86.7 fL (ref 78.0–100.0)
Monocytes Absolute: 1 10*3/uL (ref 0.1–1.0)
Monocytes Relative: 11 %
Neutro Abs: 6.6 10*3/uL (ref 1.7–7.7)
Neutrophils Relative %: 68 %
PLATELETS: 232 10*3/uL (ref 150–400)
RBC: 5.03 MIL/uL (ref 4.22–5.81)
RDW: 14.3 % (ref 11.5–15.5)
WBC: 9.6 10*3/uL (ref 4.0–10.5)

## 2017-09-30 LAB — TROPONIN I
Troponin I: <0.03 ng/mL (ref <0.03)
Troponin I: <0.03 ng/mL (ref <0.03)

## 2017-09-30 MED ORDER — COLCHICINE 0.6 MG PO TABS: 0.6000 mg | ORAL_TABLET | Freq: Two times a day (BID) | ORAL | 0 refills | Status: DC

## 2017-09-30 MED ORDER — IBUPROFEN 800 MG PO TABS
800.0000 mg | ORAL_TABLET | Freq: Once | ORAL | Status: DC
  Filled 2017-09-30: qty 1

## 2017-09-30 MED ORDER — TRAMADOL HCL 50 MG PO TABS: 50.0000 mg | ORAL_TABLET | Freq: Four times a day (QID) | ORAL | 0 refills | Status: DC | PRN

## 2017-09-30 MED ORDER — IBUPROFEN 400 MG PO TABS
600.0000 mg | ORAL_TABLET | Freq: Once | ORAL | Status: AC
  Administered 2017-09-30: 600 mg via ORAL
  Filled 2017-09-30: qty 1

## 2017-09-30 MED ORDER — PREDNISONE 20 MG PO TABS: 40.0000 mg | ORAL_TABLET | Freq: Every day | ORAL | 0 refills | Status: AC

## 2017-09-30 NOTE — Discharge Instructions (Signed)

## 2017-09-30 NOTE — ED Notes (Signed)
Pt and FM given d/c instructions as per chart. Verbalizes understanding. No questions. RX x 3 with precautions.

## 2017-09-30 NOTE — ED Notes (Signed)
Patient transported to X-ray 

## 2017-09-30 NOTE — ED Triage Notes (Signed)
Patient states that he has had gout in his left wrist in the past. Today he reports that he has pain worse that starts in his wrist and radiates to his left chest region. Patient reports that 2 hours ago he started to have 2 episodes of loose stools

## 2017-09-30 NOTE — ED Provider Notes (Signed)
Emergency Department Provider Note   I have reviewed the triage vital signs and the nursing notes.   HISTORY  Chief Complaint Wrist Pain   HPI Francis Lin is a 41 y.o. male with PMH of DVT (resolved) and IBS presents to the emergency department for evaluation of left wrist pain with swelling.  This began this morning when he woke up.  He placed the wrist in a brace and took over-the-counter medication with no relief in symptoms.  Patient reports that this wrist has had gout several times in the past and this feels similar.  Denies any fevers or chills.  Later in the day he developed some chest discomfort that felt like pain radiating from the left wrist to the left chest.  No modifying factors.  No exertional or pleuritic pain.  Denies dyspnea.  Patient also had some associated diarrhea which also started today.    Past Medical History:  Diagnosis Date  . Acid reflux   . DVT (deep venous thrombosis) ([place])   . Esophagitis   . Hypersomnia, persistent   . IBS (irritable bowel syndrome)   . Shifting sleep-work schedule, affecting sleep 06/20/2013  . Snoring disorder 06/20/2013    Patient Active Problem List   Diagnosis Date Noted  . Situational anxiety 08/28/2014  . Unspecified vitamin D deficiency 09/22/2013  . Shifting sleep-work schedule, affecting sleep 06/20/2013  . Snoring disorder 06/20/2013  . Hypersomnia, persistent   . History of esophageal stricture 04/16/2013  . Pain in joint, ankle and foot 04/16/2013  . Gout 04/16/2013  . OBESITY, CLASS II, BMI 35- 39.9 03/15/2012  . Hypertriglyceridemia 03/15/2012  . CHEST PAIN UNSPECIFIED 12/02/2008  . Dysphagia 12/02/2008    Past Surgical History:  Procedure Laterality Date  . ORIF TIBIA & FIBULA FRACTURES      Current Outpatient Rx  . Order #: [id number] Class: Historical Med  . Order #: [id number] Class: Normal  . Order #: [id number] Class: Print  . Order #: [id number] Class: Normal  . Order #: [id number] Class: Print  .  Order #: [id number] Class: Historical Med  . Order #: [id number] Class: Print  . Order #: [id number] Class: Print  . Order #: [id number] Class: Print    Allergies Caffeine; Omeprazole; Peanuts [peanut oil]; Penicillins; Seasonal ic [cholestatin]; and Lactose intolerance (gi)  Family History  Problem Relation Age of Onset  . Heart attack Father   . Arthritis Father   . Hypertension Father   . Cancer Father        multiple myeloma  . Edema Sister   . Diabetes Sister   . Bipolar disorder Sister     Social History Social History   Tobacco Use  . Smoking status: Never Smoker  . Smokeless tobacco: Never Used  Substance Use Topics  . Alcohol use: No  . Drug use: No    Review of Systems  Constitutional: No fever/chills Eyes: No visual changes. ENT: No sore throat. Cardiovascular: Positive chest pain. Respiratory: Denies shortness of breath. Gastrointestinal: No abdominal pain.  No nausea, no vomiting.  No diarrhea.  No constipation. Genitourinary: Negative for dysuria. Musculoskeletal: Negative for back pain. Positive left wrist pain.  Skin: Negative for rash. Neurological: Negative for headaches, focal weakness or numbness.  10-point ROS otherwise negative.  ____________________________________________   PHYSICAL EXAM:  VITAL SIGNS: ED Triage Vitals  Enc Vitals Group     BP 09/30/17 1849 (!) 154/103     Pulse Rate 09/30/17 1849 86     Resp 09/30/17 1849 18     Temp  09/30/17 1849 98.7 F (37.1 C)     Temp Source 09/30/17 1849 Oral     SpO2 09/30/17 1849 100 %     Weight 09/30/17 1846 (!) 330 lb (149.7 kg)     Height 09/30/17 1846 '6\' 3"'  (1.905 m)     Pain Score 09/30/17 1845 10   Constitutional: Alert and oriented. Well appearing and in no acute distress. Eyes: Conjunctivae are normal.  Head: Atraumatic. Nose: No congestion/rhinnorhea. Mouth/Throat: Mucous membranes are moist.  Neck: No stridor.  Cardiovascular: Normal rate, regular rhythm. Good peripheral  circulation. Grossly normal heart sounds.   Respiratory: Normal respiratory effort.  No retractions. Lungs CTAB. Gastrointestinal: Soft and nontender. No distention.  Musculoskeletal: No lower extremity tenderness nor edema. No gross deformities of extremities. Positive left wrist pain and swelling without redness or significant warmth.  Neurologic:  Normal speech and language. No gross focal neurologic deficits are appreciated.  Skin:  Skin is warm, dry and intact. No rash noted.   ____________________________________________   LABS (all labs ordered are listed, but only abnormal results are displayed)  Labs Reviewed  BASIC METABOLIC PANEL - Abnormal; Notable for the following components:      Result Value   Creatinine, Ser 1.48 (*)    Calcium 8.6 (*)    GFR calc non Af Amer 57 (*)    All other components within normal limits  CBC WITH DIFFERENTIAL/PLATELET  TROPONIN I  TROPONIN I   ____________________________________________  EKG   EKG Interpretation  Date/Time:  Saturday September 30 2017 19:13:36 EST Ventricular Rate:  80 PR Interval:    QRS Duration: 99 QT Interval:  347 QTC Calculation: 401 R Axis:   55 Text Interpretation:  Sinus rhythm No STEMI. Similar to prior tracing.  Confirmed by [name] [phone]) on 09/30/2017 7:17:10 PM       ____________________________________________  RADIOLOGY  Dg Chest 2 View  Result Date: 09/30/2017 CLINICAL DATA:  41 year old male with chest pain for the past 2 hours EXAM: CHEST  2 VIEW COMPARISON:  Prior chest x-ray 10/17/2014 FINDINGS: The lungs are clear and negative for focal airspace consolidation, pulmonary edema or suspicious pulmonary nodule. No pleural effusion or pneumothorax. Cardiac and mediastinal contours are within normal limits. No acute fracture or lytic or blastic osseous lesions. The visualized upper abdominal bowel gas pattern is unremarkable. IMPRESSION: Negative chest x-ray. Electronically Signed   By:  Jacqulynn Cadet M.D.   On: 09/30/2017 19:29    ____________________________________________   PROCEDURES  Procedure(s) performed:   Procedures  None ____________________________________________   INITIAL IMPRESSION / ASSESSMENT AND PLAN / ED COURSE  Pertinent labs & imaging results that were available during my care of the patient were reviewed by me and considered in my medical decision making (see chart for details).  Patient presents to the emergency department for evaluation of left wrist pain consistent with gout that the patient has had in the past.  There is no significant warmth or redness over the joint.  There is some mild swelling.  Patient denies injury.  Chest pain seems to be radiating from the left arm and related to his wrist discomfort but plan for EKG, troponin, chest x-ray for further evaluation.  Does have history of DVT that was related to an injury in the past.  Patient states they have confirmed that there is no residual clot in the leg and he continues to take a baby ASA daily.   Repeat troponin negative. Plan for treatment of gout symptoms. No  evidence to suggest septic arthritis. Discussed follow up with PCP and Cardiology.   At this time, I do not feel there is any life-threatening condition present. I have reviewed and discussed all results (EKG, imaging, lab, urine as appropriate), exam findings with patient. I have reviewed nursing notes and appropriate previous records.  I feel the patient is safe to be discharged home without further emergent workup. Discussed usual and customary return precautions. Patient and family (if present) verbalize understanding and are comfortable with this plan.  Patient will follow-up with their primary care provider. If they do not have a primary care provider, information for follow-up has been provided to them. All questions have been answered.  ____________________________________________  FINAL CLINICAL IMPRESSION(S) /  ED DIAGNOSES  Final diagnoses:  Right wrist pain  Precordial chest pain     MEDICATIONS GIVEN DURING THIS VISIT:  Medications  ibuprofen (ADVIL,MOTRIN) tablet 600 mg (600 mg Oral Given 09/30/17 1927)     NEW OUTPATIENT MEDICATIONS STARTED DURING THIS VISIT:  Colchicine, Prednisone, and Tramadol   Note:  This document was prepared using Dragon voice recognition software and may include unintentional dictation errors.  [name], MD Emergency Medicine    [name], Wonda Olds, MD 09/30/17 [phone]

## 2018-04-02 ENCOUNTER — Other Ambulatory Visit: Payer: Self-pay

## 2018-04-02 ENCOUNTER — Emergency Department (HOSPITAL_BASED_OUTPATIENT_CLINIC_OR_DEPARTMENT_OTHER): Payer: Managed Care, Other (non HMO)

## 2018-04-02 ENCOUNTER — Emergency Department (HOSPITAL_BASED_OUTPATIENT_CLINIC_OR_DEPARTMENT_OTHER)
Admission: EM | Admit: 2018-04-02 | Discharge: 2018-04-03 | Disposition: A | Discharge status: Home / Self Care | Payer: Managed Care, Other (non HMO) | Attending: Emergency Medicine | Admitting: Emergency Medicine

## 2018-04-02 ENCOUNTER — Encounter (HOSPITAL_BASED_OUTPATIENT_CLINIC_OR_DEPARTMENT_OTHER): Payer: Self-pay

## 2018-04-02 DIAGNOSIS — M79661 Pain in right lower leg: Secondary | ICD-10-CM | POA: Diagnosis not present

## 2018-04-02 DIAGNOSIS — Z7982 Long term (current) use of aspirin: Secondary | ICD-10-CM | POA: Insufficient documentation

## 2018-04-02 DIAGNOSIS — Z91010 Allergy to peanuts: Secondary | ICD-10-CM | POA: Insufficient documentation

## 2018-04-02 DIAGNOSIS — M79602 Pain in left arm: Secondary | ICD-10-CM

## 2018-04-02 DIAGNOSIS — Z79899 Other long term (current) drug therapy: Secondary | ICD-10-CM | POA: Insufficient documentation

## 2018-04-02 DIAGNOSIS — R42 Dizziness and giddiness: Secondary | ICD-10-CM | POA: Diagnosis present

## 2018-04-02 LAB — CBC
HEMATOCRIT: 42.6 % (ref 39.0–52.0)
HEMOGLOBIN: 15.1 g/dL (ref 13.0–17.0)
MCH: 30.4 pg (ref 26.0–34.0)
MCHC: 35.4 g/dL (ref 30.0–36.0)
MCV: 85.7 fL (ref 78.0–100.0)
Platelets: 222 10*3/uL (ref 150–400)
RBC: 4.97 MIL/uL (ref 4.22–5.81)
RDW: 13.8 % (ref 11.5–15.5)
WBC: 7.8 10*3/uL (ref 4.0–10.5)

## 2018-04-02 LAB — BASIC METABOLIC PANEL
ANION GAP: 8 (ref 5–15)
BUN: 15 mg/dL (ref 6–20)
CHLORIDE: 105 mmol/L (ref 101–111)
CO2: 26 mmol/L (ref 22–32)
Calcium: 8.5 mg/dL — ABNORMAL LOW (ref 8.9–10.3)
Creatinine, Ser: 1.39 mg/dL — ABNORMAL HIGH (ref 0.61–1.24)
GFR calc Af Amer: >60 mL/min (ref >60)
GFR calc non Af Amer: >60 mL/min (ref >60)
Glucose, Bld: 84 mg/dL (ref 65–99)
POTASSIUM: 3.6 mmol/L (ref 3.5–5.1)
SODIUM: 139 mmol/L (ref 135–145)

## 2018-04-02 LAB — TROPONIN I: Troponin I: <0.03 ng/mL (ref <0.03)

## 2018-04-02 NOTE — ED Triage Notes (Signed)
C/o numbness to left arm, pain to left ear, dizziness-started 3pm-started during a "functional capacity evaluation" test for a WC injury where the paper reads pt HR dropped in the 30-40s-also c/o pain to right LE-"constant"-hx of DVT to LE-NAD-steady gait

## 2018-04-03 LAB — TROPONIN I: Troponin I: <0.03 ng/mL (ref <0.03)

## 2018-04-03 LAB — D-DIMER, QUANTITATIVE (NOT AT ARMC)

## 2018-04-03 NOTE — Discharge Instructions (Addendum)
Please call the cardiologist for follow-up

## 2018-04-03 NOTE — ED Provider Notes (Signed)
[facility] EMERGENCY DEPARTMENT Provider Note   CSN: [id number] Arrival date & time: 04/02/18  2141     History   Chief Complaint Chief Complaint  Patient presents with  . Numbness    HPI Francis Lin is a 42 y.o. male.  HPI Patient is a 42 year old male presents the emergency department complaints of intermittent numbness to his left arm as well as pain to his left ear and some dizziness started at 3 PM during a Workmen's Comp. functional capacity evaluation.  He was sent to the emergency department because while on the treadmill he developed bradycardia down around 35-40 and was sent for further evaluation.  He had no chest pain or shortness of breath or worsening arm pain during the treadmill portion of the testing.  He has no prior history of cardiac disease.  He has been told before that he has high blood pressure and high cholesterol but is not on medications.  He does not smoke cigarettes.  No family history of early cardiac disease.  No chest discomfort at this time.  He states his left arm numbness and discomfort is been present for several weeks.  He also reported some mild right leg pain during the evaluation.  He denies right leg pain at this time.  He does have a history of significant trauma to his right lower extremity and did have a DVT in his right lower extremity before.  He reports some mild discomfort in his chest with deep breathing.  No shortness of breath.  No history of pulmonary embolism.  He is no longer on anticoagulation.  He is placed on anticoagulation for what sounds like 3 months after his provoked DVT in his right lower extremity.  He does report some mild right calf pain.   Past Medical History:  Diagnosis Date  . Acid reflux   . DVT (deep venous thrombosis) ([place])   . Esophagitis   . Hypersomnia, persistent   . IBS (irritable bowel syndrome)   . Shifting sleep-work schedule, affecting sleep 06/20/2013  . Snoring disorder 06/20/2013     Patient Active Problem List   Diagnosis Date Noted  . Situational anxiety 08/28/2014  . Unspecified vitamin D deficiency 09/22/2013  . Shifting sleep-work schedule, affecting sleep 06/20/2013  . Snoring disorder 06/20/2013  . Hypersomnia, persistent   . History of esophageal stricture 04/16/2013  . Pain in joint, ankle and foot 04/16/2013  . Gout 04/16/2013  . OBESITY, CLASS II, BMI 35- 39.9 03/15/2012  . Hypertriglyceridemia 03/15/2012  . CHEST PAIN UNSPECIFIED 12/02/2008  . Dysphagia 12/02/2008    Past Surgical History:  Procedure Laterality Date  . ORIF TIBIA & FIBULA FRACTURES          Home Medications    Prior to Admission medications   Medication Sig Start Date End Date Taking? Authorizing Provider  aspirin EC 81 MG tablet Take 81 mg by mouth daily.    [provider]  cetirizine (ZYRTEC) 10 MG tablet Take 1 tablet (10 mg total) by mouth daily. 04/09/15   Brewington, Tishira R, PA-C  colchicine 0.6 MG tablet Take 1 tablet (0.6 mg total) by mouth 2 (two) times daily for 7 days. 09/30/17 10/07/17  LongWonda Olds, MD  hydrOXYzine (ATARAX/VISTARIL) 25 MG tablet Take 0.5-1 tablets (12.5-25 mg total) by mouth every 8 (eight) hours as needed for itching. 04/09/15   Brewington, Tishira R, PA-C  naproxen (NAPROSYN) 500 MG tablet Take 1 tablet (500 mg total) by mouth 2 (two)  times daily with a meal. 01/12/16   Horton, Barbette Hair, MD  naproxen sodium (ALEVE) 220 MG tablet Take 220-440 mg by mouth daily as needed (for pain).    [provider]  oxyCODONE-acetaminophen (PERCOCET/ROXICET) 5-325 MG tablet Take 1-2 tablets by mouth every 6 (six) hours as needed for severe pain. 01/12/16   Horton, Barbette Hair, MD  traMADol (ULTRAM) 50 MG tablet Take 1 tablet (50 mg total) by mouth every 6 (six) hours as needed. 09/30/17   Long, Wonda Olds, MD    Family History Family History  Problem Relation Age of Onset  . Heart attack Father   . Arthritis Father   . Hypertension  Father   . Cancer Father        multiple myeloma  . Edema Sister   . Diabetes Sister   . Bipolar disorder Sister     Social History Social History   Tobacco Use  . Smoking status: Never Smoker  . Smokeless tobacco: Never Used  Substance Use Topics  . Alcohol use: No  . Drug use: No     Allergies   Caffeine; Omeprazole; Peanuts [peanut oil]; Penicillins; Seasonal ic [cholestatin]; and Lactose intolerance (gi)   Review of Systems Review of Systems  All other systems reviewed and are negative.    Physical Exam Updated Vital Signs BP 130/80   Pulse (!) 59   Temp 98.1 F (36.7 C) (Oral)   Resp 16   Ht '6\' 2"'  (1.88 m)   Wt (!) 150.1 kg (331 lb)   SpO2 100%   BMI 42.50 kg/m   Physical Exam  Constitutional: He is oriented to person, place, and time. He appears well-developed and well-nourished.  HENT:  Head: Normocephalic and atraumatic.  Eyes: EOM are normal.  Neck: Normal range of motion.  Cardiovascular: Normal rate, regular rhythm, normal heart sounds and intact distal pulses.  Pulmonary/Chest: Effort normal and breath sounds normal. No respiratory distress.  Abdominal: Soft. He exhibits no distension. There is no tenderness.  Musculoskeletal: Normal range of motion.  Mild swelling of the right lower extremity as compared to the left.  Patient with a compression stocking on the right lower extremity which was removed.  Palpable PT and DP pulse in the right foot.  Normal left and right radial pulses.  Neurological: He is alert and oriented to person, place, and time.  Skin: Skin is warm and dry.  Psychiatric: He has a normal mood and affect. Judgment normal.  Nursing note and vitals reviewed.    ED Treatments / Results  Labs (all labs ordered are listed, but only abnormal results are displayed) Labs Reviewed  BASIC METABOLIC PANEL - Abnormal; Notable for the following components:      Result Value   Creatinine, Ser 1.39 (*)    Calcium 8.5 (*)    All  other components within normal limits  CBC  TROPONIN I  D-DIMER, QUANTITATIVE (NOT AT [facility])  TROPONIN I    EKG EKG Interpretation  Date/Time:  Monday April 02 2018 21:56:36 EDT Ventricular Rate:  70 PR Interval:  156 QRS Duration: 96 QT Interval:  378 QTC Calculation: 408 R Axis:   23 Text Interpretation:  Normal sinus rhythm Normal ECG No significant change was found Confirmed by Jola Schmidt [phone]) on 04/02/2018 11:56:47 PM   Radiology Dg Chest 2 View  Result Date: 04/02/2018 CLINICAL DATA:  42 year old male with chest pain. EXAM: CHEST - 2 VIEW COMPARISON:  Chest radiograph dated 09/30/2017 FINDINGS: Mild increased interstitial  densities, likely atelectatic changes. There is no focal consolidation, pleural effusion, or pneumothorax. The cardiac silhouette is within normal limits. No acute osseous pathology. IMPRESSION: No active cardiopulmonary disease. Electronically Signed   By: Anner Crete M.D.   On: 04/02/2018 23:51    Procedures Procedures (including critical care time)  Medications Ordered in ED Medications - No data to display   Initial Impression / Assessment and Plan / ED Course  I have reviewed the triage vital signs and the nursing notes.  Pertinent labs & imaging results that were available during my care of the patient were reviewed by me and considered in my medical decision making (see chart for details).     Mild swelling of the right lower extremity as compared to left.  This is likely chronic venous insufficiency from his history of DVT and his history of significant trauma to his right lower extremity.  D-dimer is negative.  I do not think he needs further evaluation for his right lower extremity.  He has normal pulses in his right foot.  In regards to the exertional bradycardia he will need outpatient evaluation by cardiology and likely outpatient stress test to further evaluate this.  He will likely benefit from echocardiogram as well.  D-dimer  negative here in the emergency department.  EKG without ischemic changes.  Troponin negative x2.  Patient without significant symptoms at this time.  I do not think he needs further evaluation in the emergency department.  He does not need admission the hospital.  Primary care and cardiology follow-up.  Patient understands return to the emergency department for new or worsening symptoms  Final Clinical Impressions(s) / ED Diagnoses   Final diagnoses:  Left arm pain  Dizziness  Pain in right lower leg    ED Discharge Orders    None       Jola Schmidt, MD 04/03/18 [phone]

## 2018-04-04 ENCOUNTER — Encounter: Payer: Self-pay | Admitting: Cardiovascular Disease

## 2018-04-04 ENCOUNTER — Ambulatory Visit (INDEPENDENT_AMBULATORY_CARE_PROVIDER_SITE_OTHER): Payer: Managed Care, Other (non HMO) | Admitting: Cardiovascular Disease

## 2018-04-04 ENCOUNTER — Telehealth: Payer: Self-pay | Admitting: Cardiovascular Disease

## 2018-04-04 VITALS — BP 132/92 | HR 81 | Ht 74.5 in | Wt 331.2 lb

## 2018-04-04 DIAGNOSIS — R072 Precordial pain: Secondary | ICD-10-CM

## 2018-04-04 DIAGNOSIS — E7800 Pure hypercholesterolemia, unspecified: Secondary | ICD-10-CM

## 2018-04-04 DIAGNOSIS — R0602 Shortness of breath: Secondary | ICD-10-CM | POA: Insufficient documentation

## 2018-04-04 NOTE — Telephone Encounter (Signed)
Follow up ° ° ° °Please return call to spouse °
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
## 2018-04-04 NOTE — Assessment & Plan Note (Signed)
Mr. Toso was referred for bradycardia that was demonstrated time of functional testing although he does complain of some atypical chest pain.  He really has no risk factors.  I am going to get a routine GXT to further evaluate.

## 2018-04-04 NOTE — Patient Instructions (Signed)
Medication Instructions:   NO CHANGE  Labwork:  Your physician recommends that you HAVE LAB WORK PRIOR TO EATING  Testing/Procedures:  Your physician has requested that you have an exercise tolerance test. For further information please visit [email or web address]. Please also follow instruction sheet, as given.   Your physician has requested that you have an echocardiogram. Echocardiography is a painless test that uses sound waves to create images of your heart. It provides your doctor with information about the size and shape of your heart and how well your heart's chambers and valves are working. This procedure takes approximately one hour. There are no restrictions for this procedure.    Follow-Up:  Your physician recommends that you schedule a follow-up appointment in: [place]

## 2018-04-04 NOTE — Assessment & Plan Note (Signed)
Drinking is occasional shortness of breath.  He has no risk factors.  He is morbidly obese.  He is never smoked.  I am going to get a 2D echo to further evaluate.

## 2018-04-04 NOTE — Telephone Encounter (Signed)
New message   Patient calling regarding leg pain. Requesting test be ordered

## 2018-04-04 NOTE — Telephone Encounter (Signed)
Left a message to call back.

## 2018-04-04 NOTE — Telephone Encounter (Signed)
Spoke with the patient and his wife was upset that he didn't tell Dr. Gwenlyn Found about his leg edema. I advised him that we have his records from the hospital and Dr. Gwenlyn Found is doing an Echo to check his heart function. Pt feels better at ease and will be sure to keep his appointments for his GXT and Echo. Pt says he can still wear his normal shoes and feels that he can walk on a treadmill. Pt will call and let us know if anything changes or worsens.

## 2018-04-13 ENCOUNTER — Other Ambulatory Visit: Payer: Managed Care, Other (non HMO) | Admitting: *Deleted

## 2018-04-13 ENCOUNTER — Telehealth (HOSPITAL_COMMUNITY): Payer: Self-pay

## 2018-04-13 ENCOUNTER — Other Ambulatory Visit: Payer: Self-pay

## 2018-04-13 ENCOUNTER — Ambulatory Visit (HOSPITAL_COMMUNITY): Payer: Managed Care, Other (non HMO) | Attending: Cardiovascular Disease

## 2018-04-13 DIAGNOSIS — I071 Rheumatic tricuspid insufficiency: Secondary | ICD-10-CM | POA: Insufficient documentation

## 2018-04-13 DIAGNOSIS — E785 Hyperlipidemia, unspecified: Secondary | ICD-10-CM | POA: Insufficient documentation

## 2018-04-13 DIAGNOSIS — Z8249 Family history of ischemic heart disease and other diseases of the circulatory system: Secondary | ICD-10-CM | POA: Diagnosis not present

## 2018-04-13 DIAGNOSIS — R0600 Dyspnea, unspecified: Secondary | ICD-10-CM | POA: Diagnosis not present

## 2018-04-13 DIAGNOSIS — R072 Precordial pain: Secondary | ICD-10-CM | POA: Diagnosis not present

## 2018-04-13 DIAGNOSIS — R001 Bradycardia, unspecified: Secondary | ICD-10-CM | POA: Diagnosis not present

## 2018-04-13 DIAGNOSIS — E7800 Pure hypercholesterolemia, unspecified: Secondary | ICD-10-CM | POA: Diagnosis not present

## 2018-04-13 DIAGNOSIS — Z86718 Personal history of other venous thrombosis and embolism: Secondary | ICD-10-CM | POA: Diagnosis not present

## 2018-04-13 LAB — LIPID PANEL
CHOL/HDL RATIO: 3.8 ratio (ref 0.0–5.0)
Cholesterol, Total: 132 mg/dL (ref 100–199)
HDL: 35 mg/dL — ABNORMAL LOW (ref >39)
LDL Calculated: 85 mg/dL (ref 0–99)
Triglycerides: 62 mg/dL (ref 0–149)
VLDL Cholesterol Cal: 12 mg/dL (ref 5–40)

## 2018-04-13 LAB — HEPATIC FUNCTION PANEL
ALBUMIN: 3.8 g/dL (ref 3.5–5.5)
ALK PHOS: 81 IU/L (ref 39–117)
ALT: 20 IU/L (ref 0–44)
AST: 21 IU/L (ref 0–40)
Bilirubin Total: 0.4 mg/dL (ref 0.0–1.2)
Bilirubin, Direct: 0.15 mg/dL (ref 0.00–0.40)
TOTAL PROTEIN: 6.6 g/dL (ref 6.0–8.5)

## 2018-04-13 NOTE — Telephone Encounter (Signed)
Encounter complete. 

## 2018-04-16 ENCOUNTER — Telehealth: Payer: Self-pay | Admitting: Cardiovascular Disease

## 2018-04-16 NOTE — Telephone Encounter (Signed)
New Message:       Pt is calling and states he would like to go over his test results a little bit more in detail.

## 2018-04-16 NOTE — Telephone Encounter (Signed)
Spoke with pt, echo results discussed in detail and also questions regarding GXT answered.

## 2018-04-18 ENCOUNTER — Ambulatory Visit (HOSPITAL_COMMUNITY)
Admission: RE | Admit: 2018-04-18 | Discharge: 2018-04-18 | Disposition: A | Discharge status: Home / Self Care | Payer: Managed Care, Other (non HMO) | Source: Ambulatory Visit | Attending: Cardiology | Admitting: Cardiology

## 2018-04-18 ENCOUNTER — Encounter: Payer: Self-pay | Admitting: *Deleted

## 2018-04-18 DIAGNOSIS — R072 Precordial pain: Secondary | ICD-10-CM

## 2018-04-18 DIAGNOSIS — E7800 Pure hypercholesterolemia, unspecified: Secondary | ICD-10-CM

## 2018-04-18 LAB — EXERCISE TOLERANCE TEST
CSEPED: 7 min
CSEPPHR: 157 {beats}/min
Estimated workload: 9.2 METS
Exercise duration (sec): 20 s
MPHR: 179 {beats}/min
Percent HR: 87 %
RPE: 19
Rest HR: 71 {beats}/min

## 2018-04-23 ENCOUNTER — Encounter: Payer: Self-pay | Admitting: Cardiovascular Disease

## 2018-05-24 ENCOUNTER — Telehealth: Payer: Self-pay | Admitting: Cardiovascular Disease

## 2018-05-24 NOTE — Telephone Encounter (Signed)
New problem   Need to speak to nurse about pt completing a FCE. Please call

## 2018-05-24 NOTE — Telephone Encounter (Signed)
Dawn to fax HPPA form stating may give medical info on pt ./cy

## 2018-06-06 ENCOUNTER — Encounter: Payer: Self-pay | Admitting: Cardiovascular Disease

## 2018-06-06 ENCOUNTER — Ambulatory Visit (INDEPENDENT_AMBULATORY_CARE_PROVIDER_SITE_OTHER): Payer: Managed Care, Other (non HMO) | Admitting: Cardiovascular Disease

## 2018-06-06 DIAGNOSIS — R001 Bradycardia, unspecified: Secondary | ICD-10-CM

## 2018-06-06 DIAGNOSIS — I208 Other forms of angina pectoris: Secondary | ICD-10-CM | POA: Diagnosis not present

## 2018-06-06 NOTE — Patient Instructions (Signed)
Medication Instructions:  Your physician recommends that you continue on your current medications as directed. Please refer to the Current Medication list given to you today.   Labwork: none  Testing/Procedures: Your physician has recommended that you wear an event monitor. Event monitors are medical devices that record the heart's electrical activity. Doctors most often [place] these monitors to diagnose arrhythmias. Arrhythmias are problems with the speed or rhythm of the heartbeat. The monitor is a small, portable device. You can wear one while you do your normal daily activities. This is usually used to diagnose what is causing palpitations/syncope (passing out).   Follow-Up: Follow up with Dr. Gwenlyn Found as needed.   Any Other Special Instructions Will Be Listed Below (If Applicable).     If you need a refill on your cardiac medications before your next appointment, please call your pharmacy.

## 2018-06-06 NOTE — Assessment & Plan Note (Signed)
There is a question of bradycardia during functional testing.  This was not demonstrated during the stress test noted during during his 2 office visits.  We will get a 30-day event monitor to further evaluate.  If there is no evidence of bradycardia he will be released back to work without limitation.

## 2018-06-06 NOTE — Progress Notes (Signed)
Streaking returns today for all up of outpatient test.  His 2D echo was normal as was his exercise stress test.  He was questioning why he was bradycardic during functional testing.  We will get a 30-day event monitor on him to further evaluate.  If this is normal we will release him to go back to work and I will see him back as needed.  Lorretta Harp, M.D., [place], [facility], [place] [address] [place], [place]  [address]  [phone] 06/06/2018 10:53 AM

## 2018-06-06 NOTE — Assessment & Plan Note (Signed)
[name] returns today for follow-up of his outpatient test.  His exercise stress test was normal as was his 2D echocardiogram.

## 2018-06-07 ENCOUNTER — Encounter: Payer: Self-pay | Admitting: Cardiovascular Disease

## 2018-06-07 ENCOUNTER — Ambulatory Visit (INDEPENDENT_AMBULATORY_CARE_PROVIDER_SITE_OTHER): Payer: Managed Care, Other (non HMO)

## 2018-06-07 DIAGNOSIS — I208 Other forms of angina pectoris: Secondary | ICD-10-CM | POA: Diagnosis not present

## 2018-06-07 DIAGNOSIS — R001 Bradycardia, unspecified: Secondary | ICD-10-CM | POA: Diagnosis not present

## 2018-06-07 NOTE — Telephone Encounter (Signed)
Entered in error

## 2018-06-13 ENCOUNTER — Telehealth: Payer: Self-pay | Admitting: *Deleted

## 2018-06-13 NOTE — Telephone Encounter (Signed)
Medical clearance request for a functional capacity evaluation faxed to the number provided.

## 2018-06-18 DIAGNOSIS — R7989 Other specified abnormal findings of blood chemistry: Secondary | ICD-10-CM | POA: Insufficient documentation

## 2018-06-19 ENCOUNTER — Telehealth: Payer: Self-pay | Admitting: *Deleted

## 2018-06-19 NOTE — Telephone Encounter (Signed)
Received critical notification from [employer] on patient  Sinus rhythm, V-tach (8 sec) rate of 162. Auto trigger 9/9 at 7:15 pm Noted that tech spoke with Dr. Storm Frisk at 8:22 pm (no note in epic).  Attempt to call patient-lmtcb.

## 2018-06-19 NOTE — Telephone Encounter (Signed)
Returned call to patient, patient states [employer] called him as well.   He does not remember having any symptoms.  He reports his leg was hurting but no other issues.      Per Dr. Gwenlyn Found, schedule ROV to discuss.     Scheduled 9/18 with Fabian Sharp PA on day Dr. Gwenlyn Found in office.   Strips placed in PA box for appt.

## 2018-06-19 NOTE — Telephone Encounter (Signed)
Follow Up:      Returning your call from today. 
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
## 2018-06-26 ENCOUNTER — Telehealth: Payer: Self-pay | Admitting: Cardiovascular Disease

## 2018-06-26 NOTE — Telephone Encounter (Signed)
Spoke with pt who states he had to cancel appointment this week due to financial reasons and requesting an appointment after next Thursday 9/16. Appointment scheduled for 9/30 at 11 with Jory Sims, DNP.

## 2018-06-26 NOTE — Telephone Encounter (Signed)
New Message   Patient is requesting call back from nurse. He cancelled the appt that he had scheduled because he did not have the copay and did not want to be billed later for it. He wants to know what he needs to do now. I did offer to reschedule for the 8th with Dr. Gwenlyn Found but he said it had to be next week. Please call patient because he needs to know what to do.

## 2018-06-27 ENCOUNTER — Ambulatory Visit: Payer: Managed Care, Other (non HMO) | Admitting: Physician Assistant

## 2018-07-07 NOTE — Progress Notes (Signed)
Cardiology Office Note   Date:  07/09/2018   ID:  Francis Lin, DOB 1976-06-06, MRN [id number]  PCP:  Bayard Beaver, PA-C  Cardiologist: Dr Gwenlyn Found   Chief Complaint  Patient presents with  . Shortness of Breath  . Edema    right leg and hand swelling     History of Present Illness: Francis Lin is a 42 y.o. male who presents for ongoing assessment and management of bradycardia.  Other history of hypertriglyceridemia.  When seen last by Dr. Alvester Chou on 04/04/2018 his bradycardia was functional he had no risk factors, but a GXT was ordered to evaluate further.  Also echocardiogram for LV function.  A 30-day cardiac monitor was also placed on the patient.  On 06/19/2018 the patient's cardiac monitor company recorded sinus rhythm with 8 seconds of V. tach at a rate of 162 bpm.  Echocardiogram dated 04/13/2018 revealed normal LV systolic function, EF of 68% to 65%, normal wall motion with no regional wall motion abnormalities.  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  Stress test revealed good exercise capacity with adequate heart rate response no evidence of ischemia, low risk study.  He has had occasional episodes of chest heaviness and "waves of feeling funny" but I have to press him for details and frequency. He has also been having more severe restless legs which are waking him up. Wife says it looks like he has "having seizures."    Past Medical History:  Diagnosis Date  . Acid reflux   . DVT (deep venous thrombosis) ([place])   . Esophagitis   . Hypersomnia, persistent   . IBS (irritable bowel syndrome)   . Shifting sleep-work schedule, affecting sleep 06/20/2013  . Snoring disorder 06/20/2013    Past Surgical History:  Procedure Laterality Date  . ORIF TIBIA & FIBULA FRACTURES       Current Outpatient Medications  Medication Sig Dispense Refill  . aspirin EC 81 MG tablet Take 81 mg by mouth daily.    . cetirizine (ZYRTEC) 10 MG tablet Take 1 tablet (10 mg total) by mouth  daily. 30 tablet 11   No current facility-administered medications for this visit.     Allergies:   Caffeine; Omeprazole; Peanuts [peanut oil]; Penicillins; Seasonal ic [cholestatin]; and Lactose intolerance (gi)    Social History:  The patient  reports that he has never smoked. He has never used smokeless tobacco. He reports that he does not drink alcohol or use drugs.   Family History:  The patient's family history includes Arthritis in his father; Bipolar disorder in his sister; Cancer in his father; Diabetes in his sister; Edema in his sister; Heart attack in his father; Hypertension in his father.    ROS: All other systems are reviewed and negative. Unless otherwise mentioned in H&P    PHYSICAL EXAM: VS:  BP 108/70   Pulse 80   Ht 6\' 2"  (1.15 m)   Wt (!) 330 lb 9.6 oz (150 kg)   BMI 42.45 kg/m  , BMI Body mass index is 42.45 kg/m. GEN: Well nourished, well developed, in no acute distress HEENT: normal Neck: no JVD, carotid bruits, or masses Cardiac: RRR; no murmurs, rubs, or gallops,no edema  Respiratory:  Clear to auscultation bilaterally, normal work of breathing GI: soft, nontender, nondistended, + BS MS: no deformity or atrophy Skin: warm and dry, no rash Neuro:  Strength and sensation are intact Psych: euthymic mood, full affect   EKG: Not completed this office visit. Cardiac  monitor report is reviewed.   Recent Labs: 04/02/2018: BUN 15; Creatinine, Ser 1.39; Hemoglobin 15.1; Platelets 222; Potassium 3.6; Sodium 139 04/13/2018: ALT 20    Lipid Panel    Component Value Date/Time   CHOL 132 04/13/2018 0000   TRIG 62 04/13/2018 0000   HDL 35 (L) 04/13/2018 0000   CHOLHDL 3.8 04/13/2018 0000   CHOLHDL 3.8 04/25/2014 1305   VLDL 22 04/25/2014 1305   LDLCALC 85 04/13/2018 0000      Wt Readings from Last 3 Encounters:  07/09/18 (!) 330 lb 9.6 oz (150 kg)  06/06/18 (!) 332 lb (150.6 kg)  04/04/18 (!) 331 lb 3.2 oz (150.2 kg)    Other studies Reviewed: Echo  04/13/2018  Left ventricle: The cavity size was normal. Wall thickness was   increased in a pattern of mild LVH. Systolic function was normal.   The estimated ejection fraction was in the range of 60% to 65%.   Wall motion was normal; there were no regional wall motion   abnormalities. Left ventricular diastolic function parameters   were normal. - Aortic valve: Calcified non coronary cusp. - Left atrium: The atrium was mildly dilated. - Atrial septum: No defect or patent foramen ovale was identified.  ASSESSMENT AND PLAN:  1.  Abnormal Cardiac Monitor: WCT noted. He was symptomatic with chest pressure and waves of not feeling well. I plan to refer this to EP. I have discussed this with Dr.Croitoru who is DOD, who agrees with this plan.  I have explained this to the patient He is in agreement with this referral. No BB will be added due to soft BP. I rechecked it myself, 124/88.   2. Questionable OSA: He has had a sleep study in the past, but it has been over 6 years. He has had worsening symptoms. May need to consider repeat testing.    Current medicines are reviewed at length with the patient today.    Labs/ tests ordered today include: Referral to [place]. [place], ANP, AACC   07/09/2018 12:39 PM    [facility] [place] [address] Office [phone] Fax [phone]

## 2018-07-09 ENCOUNTER — Encounter: Payer: Self-pay | Admitting: Adult Health

## 2018-07-09 ENCOUNTER — Ambulatory Visit (INDEPENDENT_AMBULATORY_CARE_PROVIDER_SITE_OTHER): Payer: Managed Care, Other (non HMO) | Admitting: Adult Health

## 2018-07-09 VITALS — BP 108/70 | HR 80 | Ht 74.0 in | Wt 330.6 lb

## 2018-07-09 DIAGNOSIS — I459 Conduction disorder, unspecified: Secondary | ICD-10-CM | POA: Diagnosis not present

## 2018-07-09 DIAGNOSIS — I472 Ventricular tachycardia: Secondary | ICD-10-CM

## 2018-07-09 DIAGNOSIS — R000 Tachycardia, unspecified: Secondary | ICD-10-CM

## 2018-07-09 NOTE — Patient Instructions (Signed)
Medication Instructions:  NO CHANGES- Your physician recommends that you continue on your current medications as directed. Please refer to the Current Medication list given to you today.  If you need a refill on your cardiac medications before your next appointment, please call your pharmacy.  Special Instructions: REFER TO EP-WIDE COMPLEX VTACH  Follow-Up: Your physician wants you to follow-up in: AFTER EP REFERRAL.   Thank you for choosing [facility] at [facility]!!

## 2018-07-19 ENCOUNTER — Ambulatory Visit (INDEPENDENT_AMBULATORY_CARE_PROVIDER_SITE_OTHER): Payer: Managed Care, Other (non HMO) | Admitting: Internal Medicine

## 2018-07-19 ENCOUNTER — Encounter: Payer: Self-pay | Admitting: Internal Medicine

## 2018-07-19 VITALS — BP 124/80 | HR 70 | Ht 74.0 in | Wt 331.0 lb

## 2018-07-19 DIAGNOSIS — R000 Tachycardia, unspecified: Secondary | ICD-10-CM

## 2018-07-19 DIAGNOSIS — I472 Ventricular tachycardia: Secondary | ICD-10-CM | POA: Diagnosis not present

## 2018-07-19 DIAGNOSIS — I4729 Other ventricular tachycardia: Secondary | ICD-10-CM

## 2018-07-19 NOTE — Patient Instructions (Addendum)
Medication Instructions:  Your physician recommends that you continue on your current medications as directed. Please refer to the Current Medication list given to you today.  Labwork: None ordered.  Testing/Procedures: None ordered.  Follow-Up: Your physician wants you to follow-up in: as needed with Dr. Allred.       Any Other Special Instructions Will Be Listed Below (If Applicable).  If you need a refill on your cardiac medications before your next appointment, please call your pharmacy.   

## 2018-07-19 NOTE — Progress Notes (Signed)
Electrophysiology Office Note   Date:  07/19/2018   ID:  ORYON GARY, DOB 08-25-1976, MRN [id number]  PCP:  Bayard Beaver, PA-C  Cardiologist:  Dr Gwenlyn Found Primary Electrophysiologist: Thompson Grayer, MD    CC: wide complex tachycardia   History of Present Illness: Francis Lin is a 42 y.o. male who presents today for electrophysiology evaluation.   He is reverred by Dr Gwenlyn Found and Jory Sims for EP consultation regarding wide complex tachycardia.  He was recently seen by Dr Gwenlyn Found 04/04/18.  He had GXT ordered as well as 30 day monitor to evaluate bradycardia.  His monitor documented 8 seconds of tachycardia at 162 bpm 06/18/18 at 7:14 pm without symptoms.  He has preserved EF.  Good exercise capacity on ETT with appropriate heart rate response to activity.   He has rare palpitations.  Not very active. Denies presyncope or syncope.   Today, he denies symptoms of chest pain, shortness of breath, orthopnea, PND, lower extremity edema, claudication, bleeding, or neurologic sequela. The patient is tolerating medications without difficulties and is otherwise without complaint today.    Past Medical History:  Diagnosis Date  . Acid reflux   . DVT (deep venous thrombosis) ([place])   . Esophagitis   . Hypersomnia, persistent   . IBS (irritable bowel syndrome)   . Shifting sleep-work schedule, affecting sleep 06/20/2013  . Snoring disorder 06/20/2013   Past Surgical History:  Procedure Laterality Date  . ORIF TIBIA & FIBULA FRACTURES       Current Outpatient Medications  Medication Sig Dispense Refill  . aspirin EC 81 MG tablet Take 81 mg by mouth daily.    . cetirizine (ZYRTEC) 10 MG tablet Take 1 tablet (10 mg total) by mouth daily. 30 tablet 11   No current facility-administered medications for this visit.     Allergies:   Caffeine; Omeprazole; Peanuts [peanut oil]; Penicillins; Seasonal ic [cholestatin]; and Lactose intolerance (gi)   Social History:  The patient  reports that  he has never smoked. He has never used smokeless tobacco. He reports that he does not drink alcohol or use drugs.   Family History:  The patient's  family history includes Arthritis in his father; Bipolar disorder in his sister; Cancer in his father; Diabetes in his sister; Edema in his sister; Heart attack in his father; Hypertension in his father.    ROS:  Please see the history of present illness.   All other systems are personally reviewed and negative.    PHYSICAL EXAM: VS:  BP 124/80   Pulse 70   Ht 6\' 2"  (1.88 m)   Wt (!) 331 lb (150.1 kg)   SpO2 97%   BMI 42.50 kg/m  , BMI Body mass index is 42.5 kg/m. GEN: Well nourished, well developed, in no acute distress  HEENT: normal  Neck: no JVD, carotid bruits, or masses Cardiac: RRR; no murmurs, rubs, or gallops,no edema  Respiratory:  clear to auscultation bilaterally, normal work of breathing GI: soft, nontender, nondistended, + BS MS: no deformity or atrophy  Skin: warm and dry  Neuro:  Strength and sensation are intact Psych: euthymic mood, full affect  EKG:  EKG is ordered today. The ekg ordered today is personally reviewed and shows sinus rhythm 71 bpm, PR 152 msec, QRS 102 msec, Qtc 399 msec   Recent Labs: 04/02/2018: BUN 15; Creatinine, Ser 1.39; Hemoglobin 15.1; Platelets 222; Potassium 3.6; Sodium 139 04/13/2018: ALT 20  personally reviewed   Lipid Panel  Component Value Date/Time   CHOL 132 04/13/2018 0000   TRIG 62 04/13/2018 0000   HDL 35 (L) 04/13/2018 0000   CHOLHDL 3.8 04/13/2018 0000   CHOLHDL 3.8 04/25/2014 1305   VLDL 22 04/25/2014 1305   LDLCALC 85 04/13/2018 0000   personally reviewed   Wt Readings from Last 3 Encounters:  07/19/18 (!) 331 lb (150.1 kg)  07/09/18 (!) 330 lb 9.6 oz (150 kg)  06/06/18 (!) 332 lb (150.6 kg)      Other studies personally reviewed: Additional studies/ records that were reviewed today include: primary cardiology notes, prior echo, event monitor  Review of  the above records today demonstrates: as above   ASSESSMENT AND PLAN:  1.  NSVT In the setting of normal EF and ekg I think that this is likely benign.  He denies presyncope or syncope.  I did offer beta blocker therapy today which he does not feel is necessary at this time. We discussed supportive care including regular exercise, stress reduction, adequate sleep, and weight loss.  He does not drink ETOH and has limited caffeine.  If palpitations change in characteristic or worsen, we could consider loop recorder placement at this time.   Follow-up with Dr Gwenlyn Found going forward I will see as needed  Current medicines are reviewed at length with the patient today.   The patient does not have concerns regarding his medicines.  The following changes were made today:  none  Labs/ tests ordered today include:  Orders Placed This Encounter  Procedures  . EKG 12-Lead     Signed, Thompson Grayer, MD  07/19/2018 9:36 AM     [facility] [address] [place] [address] [phone] (office) [phone] (fax)

## 2018-08-01 ENCOUNTER — Ambulatory Visit: Payer: Managed Care, Other (non HMO) | Admitting: Cardiovascular Disease

## 2018-08-03 ENCOUNTER — Encounter: Payer: Self-pay | Admitting: Cardiovascular Disease

## 2018-08-03 ENCOUNTER — Ambulatory Visit (INDEPENDENT_AMBULATORY_CARE_PROVIDER_SITE_OTHER): Payer: Managed Care, Other (non HMO) | Admitting: Cardiovascular Disease

## 2018-08-03 DIAGNOSIS — I208 Other forms of angina pectoris: Secondary | ICD-10-CM | POA: Diagnosis not present

## 2018-08-03 DIAGNOSIS — I4729 Other ventricular tachycardia: Secondary | ICD-10-CM | POA: Insufficient documentation

## 2018-08-03 DIAGNOSIS — I472 Ventricular tachycardia: Secondary | ICD-10-CM | POA: Diagnosis not present

## 2018-08-03 NOTE — Patient Instructions (Signed)
Your physician recommends that you schedule a follow-up appointment in: AS NEEDED  
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
## 2018-08-03 NOTE — Assessment & Plan Note (Signed)
History of nonsustained VT seen on event monitoring.  He was asymptomatic during this and was evaluated on 07/19/2018 by Dr. Rayann Heman, elective physiology, he said that this was of no clinical consequence especially in light of lack of symptoms and normal LV function.

## 2018-08-03 NOTE — Assessment & Plan Note (Signed)
Atypical chest and left upper extremity discomfort with a negative GXT.  He no longer has symptoms I suspect this was noncardiac.

## 2018-11-22 DIAGNOSIS — G43009 Migraine without aura, not intractable, without status migrainosus: Secondary | ICD-10-CM | POA: Insufficient documentation

## 2019-04-02 ENCOUNTER — Ambulatory Visit: Payer: Managed Care, Other (non HMO) | Admitting: Sports Medicine

## 2019-04-02 ENCOUNTER — Other Ambulatory Visit: Payer: Self-pay | Admitting: Sports Medicine

## 2019-04-02 ENCOUNTER — Ambulatory Visit (INDEPENDENT_AMBULATORY_CARE_PROVIDER_SITE_OTHER): Payer: Managed Care, Other (non HMO)

## 2019-04-02 ENCOUNTER — Encounter: Payer: Self-pay | Admitting: Sports Medicine

## 2019-04-02 ENCOUNTER — Other Ambulatory Visit: Payer: Self-pay

## 2019-04-02 DIAGNOSIS — M10479 Other secondary gout, unspecified ankle and foot: Secondary | ICD-10-CM

## 2019-04-02 DIAGNOSIS — M79671 Pain in right foot: Secondary | ICD-10-CM

## 2019-04-02 DIAGNOSIS — M7662 Achilles tendinitis, left leg: Secondary | ICD-10-CM

## 2019-04-02 DIAGNOSIS — M79672 Pain in left foot: Secondary | ICD-10-CM

## 2019-04-02 DIAGNOSIS — I739 Peripheral vascular disease, unspecified: Secondary | ICD-10-CM

## 2019-04-02 DIAGNOSIS — Z8781 Personal history of (healed) traumatic fracture: Secondary | ICD-10-CM

## 2019-04-02 DIAGNOSIS — M722 Plantar fascial fibromatosis: Secondary | ICD-10-CM

## 2019-04-02 NOTE — Patient Instructions (Signed)
For tennis shoes recommend:  Brooks Beast Ascis New balance Saucony Can be purchased at [employer] or [employer]  Vionic  SAS Can be purchased at [employer] or [employer]   For work shoes recommend: Sketchers Work Timberland boots  Can be purchased at a variety of places or [employer]   For casual shoes recommend: Vionic  Can be purchased at [employer] or [employer]  

## 2019-04-02 NOTE — Progress Notes (Signed)
Subjective: Francis Lin is a 43 y.o. male patient who presents to office for evaluation of bilateral foot pain. Patient complains of ocassional pain off and on that goes a way on its own at right 1st toe and heel pain that flares that causes him to be unable to walk at time. History of surgery on right/trauma and spur/fracture on right. Patient denies any other pedal complaints. Denies new injury/trip/fall/sprain/any causative factors.   Review of Systems  Skin:       Color change  All other systems reviewed and are negative.   Patient Active Problem List   Diagnosis Date Noted  . Migraine without aura and without status migrainosus, not intractable 11/22/2018  . Nonsustained ventricular tachycardia ([place]) 08/03/2018  . Elevated serum creatinine 06/18/2018  . Slow heart rate 06/06/2018  . Shortness of breath 04/04/2018  . Venous insufficiency 01/05/2017  . Deep vein thrombosis (DVT) of popliteal vein of right lower extremity ([place]) 06/30/2016  . S/P ORIF (open reduction internal fixation) fracture 06/22/2016  . Multiple fractures of both lower extremities 05/31/2016  . Elevated CK 05/17/2016  . Fracture of right fibula 05/17/2016  . Fracture of right tibia 05/17/2016  . Fracture of tibia, left, closed 05/17/2016  . Trauma 05/17/2016  . Situational anxiety 08/28/2014  . Unspecified vitamin D deficiency 09/22/2013  . Shifting sleep-work schedule, affecting sleep 06/20/2013  . Snoring disorder 06/20/2013  . Hypersomnia, persistent   . History of esophageal stricture 04/16/2013  . Pain in joint, ankle and foot 04/16/2013  . Gout 04/16/2013  . 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 OBESITY, CLASS II, BMI 35- 39.9 03/15/2012  . Hypertriglyceridemia 03/15/2012  . CHEST PAIN UNSPECIFIED 12/02/2008  . Dysphagia 12/02/2008    Current Outpatient Medications on File Prior to Visit  Medication Sig Dispense Refill  . aspirin EC 81 MG tablet Take 81 mg by mouth daily.    . cetirizine  (ZYRTEC) 10 MG tablet Take 1 tablet (10 mg total) by mouth daily. 30 tablet 11  . cimetidine (TAGAMET) 200 MG tablet Take by mouth.    . colchicine 0.6 MG tablet Take by mouth.    . meloxicam (MOBIC) 7.5 MG tablet TK 1 T PO QD PRF PAIN    . oseltamivir (TAMIFLU) 75 MG capsule     . predniSONE (STERAPRED UNI-PAK 21 TAB) 10 MG (21) TBPK tablet TK UTD ON DOSE PACK FOR 6 DAYS    . topiramate (TOPAMAX) 25 MG tablet Start 1 hs, increase weekly to 4 hs    . triamcinolone ointment (KENALOG) 0.1 % Apply a small amount to the affected area 1 to 2 times daily as needed for pruritic rash     No current facility-administered medications on file prior to visit.     Allergies  Allergen Reactions  . Lactose Diarrhea and Other (See Comments)    Lactose intolerance  . Caffeine Other (See Comments)    "makes feel bad", headaches  . Omeprazole Other (See Comments)    Mood swing  . Peanuts [Peanut Oil]   . Penicillins Other (See Comments)    Headaches   . Seasonal Ic [Cholestatin]     Sneezing,runny nose , itchy eyes  . Lactose Intolerance (Gi)     Objective:  General: Alert and oriented x3 in no acute distress  Dermatology: Old scars.  No open lesions bilateral lower extremities, no webspace macerations, no ecchymosis bilateral, all nails x 10 are well manicured.  Vascular: Dorsalis Pedis and Posterior Tibial pedal pulses  palpable, Capillary Fill Time 3 seconds,(+) pedal hair growth bilateral, no edema bilateral lower extremities, Temperature gradient within normal limits. Subjective color change.   Neurology: Johney Maine sensation intact via light touch bilateral.   Musculoskeletal: No tenderness bilateral, decreased 1st MPJ rom Right>Left with functional limitus noted on weightbearing exam and decreased ankle ROM. Strength within normal limits in all groups bilateral.   Gait: Non-Antalgic gait  Xrays  Left and Right Foot   Impression:Heel spur with avulsion fleck at left heel. Hardware intact to  right tibia. 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   Assessment and Plan: Problem List Items Addressed This Visit      Other   Gout - Primary   Relevant Orders   CBC with Differential   Basic Metabolic Panel   Uric acid   Rheumatoid factor   C-reactive protein   Sedimentation Rate   HLA-B27 Antigen    Other Visit Diagnoses    Pain in left foot       Pain in right foot       Tendonitis, Achilles, left       Plantar fasciitis of left foot       PVD (peripheral vascular disease) (HCC)       History of fracture           -Complete examination performed -Xrays reviewed -Discussed treatement options for occasional pain and discoloartion -Rx Arthritic panel and informed patient can have gout even though uric acid has been + in the past due to family history -Advised patient that color change is likely PVD and history of trauma continue with compression stockings -Advised patient to use heel lifts for left and that we will monitor these areas and continue with stretching and that likely the fracture at left heel is avulsion from over pull of the achilles and if continues to hurt will further work up with MRI, however since there is no pain right now does not need any intervention -Shoe list/recs given -Patient to return to office after arthritic panel sooner if condition worsens.  Landis Martins, DPM

## 2019-04-27 ENCOUNTER — Emergency Department (HOSPITAL_BASED_OUTPATIENT_CLINIC_OR_DEPARTMENT_OTHER)
Admission: EM | Admit: 2019-04-27 | Discharge: 2019-04-28 | Disposition: A | Discharge status: Home / Self Care | Payer: Managed Care, Other (non HMO) | Attending: Emergency Medicine | Admitting: Emergency Medicine

## 2019-04-27 ENCOUNTER — Other Ambulatory Visit: Payer: Self-pay

## 2019-04-27 ENCOUNTER — Encounter (HOSPITAL_BASED_OUTPATIENT_CLINIC_OR_DEPARTMENT_OTHER): Payer: Self-pay

## 2019-04-27 DIAGNOSIS — Z79899 Other long term (current) drug therapy: Secondary | ICD-10-CM | POA: Insufficient documentation

## 2019-04-27 DIAGNOSIS — B349 Viral infection, unspecified: Secondary | ICD-10-CM

## 2019-04-27 DIAGNOSIS — Z880 Allergy status to penicillin: Secondary | ICD-10-CM | POA: Insufficient documentation

## 2019-04-27 DIAGNOSIS — U071 COVID-19: Secondary | ICD-10-CM | POA: Insufficient documentation

## 2019-04-27 DIAGNOSIS — Z7982 Long term (current) use of aspirin: Secondary | ICD-10-CM | POA: Diagnosis not present

## 2019-04-27 DIAGNOSIS — Z7189 Other specified counseling: Secondary | ICD-10-CM

## 2019-04-27 DIAGNOSIS — M791 Myalgia, unspecified site: Secondary | ICD-10-CM | POA: Diagnosis present

## 2019-04-27 NOTE — ED Triage Notes (Signed)
Pt c/o body aches, HA, and fever starting last Sunday. TMax 100.2. Pt c/o abd pain with nausea. Denies vomiting and diarrhea. Pt now states he had a cough that started 2 days ago.

## 2019-04-28 ENCOUNTER — Emergency Department (HOSPITAL_BASED_OUTPATIENT_CLINIC_OR_DEPARTMENT_OTHER): Payer: Managed Care, Other (non HMO)

## 2019-04-28 LAB — BASIC METABOLIC PANEL
Anion gap: 11 (ref 5–15)
BUN: 11 mg/dL (ref 6–20)
CO2: 23 mmol/L (ref 22–32)
Calcium: 8.6 mg/dL — ABNORMAL LOW (ref 8.9–10.3)
Chloride: 104 mmol/L (ref 98–111)
Creatinine, Ser: 1.29 mg/dL — ABNORMAL HIGH (ref 0.61–1.24)
GFR calc Af Amer: >60 mL/min (ref >60)
GFR calc non Af Amer: >60 mL/min (ref >60)
Glucose, Bld: 93 mg/dL (ref 70–99)
Potassium: 3.6 mmol/L (ref 3.5–5.1)
Sodium: 138 mmol/L (ref 135–145)

## 2019-04-28 LAB — CBC WITH DIFFERENTIAL/PLATELET
Abs Immature Granulocytes: 0 10*3/uL (ref 0.00–0.07)
Basophils Absolute: 0 10*3/uL (ref 0.0–0.1)
Basophils Relative: 0 %
Eosinophils Absolute: 0 10*3/uL (ref 0.0–0.5)
Eosinophils Relative: 1 %
HCT: 46.8 % (ref 39.0–52.0)
Hemoglobin: 15.2 g/dL (ref 13.0–17.0)
Immature Granulocytes: 0 %
Lymphocytes Relative: 24 %
Lymphs Abs: 1.3 10*3/uL (ref 0.7–4.0)
MCH: 29 pg (ref 26.0–34.0)
MCHC: 32.5 g/dL (ref 30.0–36.0)
MCV: 89.1 fL (ref 80.0–100.0)
Monocytes Absolute: 0.6 10*3/uL (ref 0.1–1.0)
Monocytes Relative: 10 %
Neutro Abs: 3.5 10*3/uL (ref 1.7–7.7)
Neutrophils Relative %: 65 %
Platelets: 203 10*3/uL (ref 150–400)
RBC: 5.25 MIL/uL (ref 4.22–5.81)
RDW: 13.7 % (ref 11.5–15.5)
WBC: 5.5 10*3/uL (ref 4.0–10.5)
nRBC: 0 % (ref 0.0–0.2)

## 2019-04-28 MED ORDER — ONDANSETRON 4 MG PO TBDP: 4.0000 mg | ORAL_TABLET | Freq: Three times a day (TID) | ORAL | 0 refills | Status: DC | PRN

## 2019-04-28 MED ORDER — IBUPROFEN 400 MG PO TABS
400.0000 mg | ORAL_TABLET | Freq: Once | ORAL | Status: AC
  Administered 2019-04-28: 400 mg via ORAL
  Filled 2019-04-28: qty 1

## 2019-04-28 MED ORDER — ONDANSETRON 4 MG PO TBDP
4.0000 mg | ORAL_TABLET | Freq: Once | ORAL | Status: AC
  Administered 2019-04-28: 4 mg via ORAL
  Filled 2019-04-28: qty 1

## 2019-04-28 NOTE — ED Notes (Signed)
Pt understood dc material. NAD Noted. Script given at [employer]. All questions answered to satisfaction. Pt escorted to check out window.

## 2019-04-28 NOTE — Discharge Instructions (Addendum)
You were seen today for multiple complaints.  This is likely related to a viral illness.  COVID-19 testing is pending.  Until results return, you should self isolate.  Treat supportively.  Make sure that you are staying hydrated.  Tylenol or Motrin for body aches and pains.  Zofran for nausea.

## 2019-04-28 NOTE — ED Provider Notes (Signed)
[place] EMERGENCY DEPARTMENT Provider Note   CSN: [id number] Arrival date & time: 04/27/19  2311    History   Chief Complaint Chief Complaint  Patient presents with  . Abdominal Pain    HPI Francis Lin is a 43 y.o. male.     HPI  This is a 43 year old male with a history of IBS, DVT who presents with generalized malaise, abdominal discomfort, nausea, and cough.  Patient reports onset of symptoms last Sunday.  He states "I just have not felt right."  Reports temperature earlier in the week to 100.2.  Reports generalized myalgias.  Reports a dry cough.  Denies any chest pain or shortness of breath.  No known sick contacts.  No known COVID contacts.  Denies loss of taste or smell.  Patient has taken some Mucinex with minimal relief.  He states tonight he just "felt off" when he got to work.  He states in general he feels better than he did earlier in the week but decided to get checked out.  Patient reports some increasing reflux symptoms.  Past Medical History:  Diagnosis Date  . Acid reflux   . DVT (deep venous thrombosis) ([place])   . Esophagitis   . Hypersomnia, persistent   . IBS (irritable bowel syndrome)   . Shifting sleep-work schedule, affecting sleep 06/20/2013  . Snoring disorder 06/20/2013    Patient Active Problem List   Diagnosis Date Noted  . Migraine without aura and without status migrainosus, not intractable 11/22/2018  . Nonsustained ventricular tachycardia ([place]) 08/03/2018  . Elevated serum creatinine 06/18/2018  . Slow heart rate 06/06/2018  . Shortness of breath 04/04/2018  . Venous insufficiency 01/05/2017  . Deep vein thrombosis (DVT) of popliteal vein of right lower extremity ([place]) 06/30/2016  . S/P ORIF (open reduction internal fixation) fracture 06/22/2016  . Multiple fractures of both lower extremities 05/31/2016  . Elevated CK 05/17/2016  . Fracture of right fibula 05/17/2016  . Fracture of right tibia 05/17/2016  . Fracture of  tibia, left, closed 05/17/2016  . Trauma 05/17/2016  . Situational anxiety 08/28/2014  . Unspecified vitamin D deficiency 09/22/2013  . Shifting sleep-work schedule, affecting sleep 06/20/2013  . Snoring disorder 06/20/2013  . Hypersomnia, persistent   . History of esophageal stricture 04/16/2013  . Pain in joint, ankle and foot 04/16/2013  . Gout 04/16/2013  . 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 OBESITY, CLASS II, BMI 35- 39.9 03/15/2012  . Hypertriglyceridemia 03/15/2012  . CHEST PAIN UNSPECIFIED 12/02/2008  . Dysphagia 12/02/2008    Past Surgical History:  Procedure Laterality Date  . ORIF TIBIA & FIBULA FRACTURES          Home Medications    Prior to Admission medications   Medication Sig Start Date End Date Taking? Authorizing Provider  aspirin EC 81 MG tablet Take 81 mg by mouth daily.    [provider]  cetirizine (ZYRTEC) 10 MG tablet Take 1 tablet (10 mg total) by mouth daily. 04/09/15   Brewington, Tishira R, PA-C  cimetidine (TAGAMET) 200 MG tablet Take by mouth. 01/30/19   [provider]  colchicine 0.6 MG tablet Take by mouth. 12/24/18   [provider]  meloxicam (MOBIC) 7.5 MG tablet TK 1 T PO QD PRF PAIN 03/14/19   [provider]  ondansetron (ZOFRAN ODT) 4 MG disintegrating tablet Take 1 tablet (4 mg total) by mouth every 8 (eight) hours as needed for nausea or vomiting. 04/28/19   Kyheem Bathgate, Barbette Hair,  MD  oseltamivir (TAMIFLU) 75 MG capsule  11/03/18   [provider]  predniSONE (STERAPRED UNI-PAK 21 TAB) 10 MG (21) TBPK tablet TK UTD ON DOSE PACK FOR 6 DAYS 11/26/18   [provider]  topiramate (TOPAMAX) 25 MG tablet Start 1 hs, increase weekly to 4 hs 11/22/18   [provider]  triamcinolone ointment (KENALOG) 0.1 % Apply a small amount to the affected area 1 to 2 times daily as needed for pruritic rash 11/15/18   [provider]    Family History Family History  Problem Relation Age  of Onset  . Heart attack Father   . Arthritis Father   . Hypertension Father   . Cancer Father        multiple myeloma  . Edema Sister   . Diabetes Sister   . Bipolar disorder Sister     Social History Social History   Tobacco Use  . Smoking status: Never Smoker  . Smokeless tobacco: Never Used  Substance Use Topics  . Alcohol use: No  . Drug use: No     Allergies   Lactose, Caffeine, Omeprazole, Peanuts [peanut oil], Penicillins, Seasonal ic [cholestatin], and Lactose intolerance (gi)   Review of Systems Review of Systems  HENT: Positive for congestion.   Respiratory: Positive for cough.   Cardiovascular: Negative for chest pain.  Gastrointestinal: Positive for abdominal pain and nausea.  Genitourinary: Negative for dysuria.  Neurological: Negative for dizziness.  All other systems reviewed and are negative.    Physical Exam Updated Vital Signs BP (!) 144/92   Pulse 81   Temp 98.5 F (36.9 C) (Oral)   Resp 18   Ht 1.905 m ('6\' 3"' )   Wt (!) 154.2 kg   SpO2 100%   BMI 42.50 kg/m   Physical Exam Vitals signs and nursing note reviewed.  Constitutional:      Appearance: He is well-developed. He is obese.  HENT:     Head: Normocephalic and atraumatic.  Eyes:     Pupils: Pupils are equal, round, and reactive to light.  Neck:     Musculoskeletal: Neck supple.  Cardiovascular:     Rate and Rhythm: Normal rate and regular rhythm.     Heart sounds: Normal heart sounds. No murmur.  Pulmonary:     Effort: Pulmonary effort is normal. No respiratory distress.     Breath sounds: Normal breath sounds. No wheezing.  Abdominal:     General: Bowel sounds are normal.     Palpations: Abdomen is soft.     Tenderness: There is no abdominal tenderness. There is no rebound.  Lymphadenopathy:     Cervical: No cervical adenopathy.  Skin:    General: Skin is warm and dry.  Neurological:     Mental Status: He is alert and oriented to person, place, and time.   Psychiatric:        Mood and Affect: Mood normal.      ED Treatments / Results  Labs (all labs ordered are listed, but only abnormal results are displayed) Labs Reviewed  BASIC METABOLIC PANEL - Abnormal; Notable for the following components:      Result Value   Creatinine, Ser 1.29 (*)    Calcium 8.6 (*)    All other components within normal limits  NOVEL CORONAVIRUS, NAA (HOSPITAL ORDER, SEND-OUT TO REF LAB)  CBC WITH DIFFERENTIAL/PLATELET    EKG None  Radiology Dg Chest Portable 1 View  Result Date: 04/28/2019 CLINICAL DATA:  43 year old male  with cough EXAM: PORTABLE CHEST 1 VIEW COMPARISON:  Chest radiograph dated 04/02/2018 FINDINGS: The heart size and mediastinal contours are within normal limits. Both lungs are clear. The visualized skeletal structures are unremarkable. IMPRESSION: No active disease. Electronically Signed   By: Anner Crete M.D.   On: 04/28/2019 00:53    Procedures Procedures (including critical care time)  Medications Ordered in ED Medications  ibuprofen (ADVIL) tablet 400 mg (has no administration in time range)  ondansetron (ZOFRAN-ODT) disintegrating tablet 4 mg (4 mg Oral Given 04/28/19 0036)     Initial Impression / Assessment and Plan / ED Course  I have reviewed the triage vital signs and the nursing notes.  Pertinent labs & imaging results that were available during my care of the patient were reviewed by me and considered in my medical decision making (see chart for details).        Patient presents with multiple complaints.  He is overall nontoxic-appearing and vital signs are reassuring.  He is afebrile here.  He has no significant physical exam findings.  Suspect viral etiology.  MVEHM-09 is certainly a consideration as well.  Patient was given Zofran and ibuprofen.  Basic lab work is reassuring.  BMP is at baseline with a slightly elevated creatinine.  Chest x-ray shows no evidence of pneumothorax or pneumonia.  COVID testing  is pending.  I discussed with patient supportive measures at home and self isolation until results of COVID testing returned.  Patient stated understanding.  After history, exam, and medical workup I feel the patient has been appropriately medically screened and is safe for discharge home. Pertinent diagnoses were discussed with the patient. Patient was given return precautions.  Francis Lin was evaluated in Emergency Department on 04/28/2019 for the symptoms described in the history of present illness. He was evaluated in the context of the global COVID-19 pandemic, which necessitated consideration that the patient might be at risk for infection with the SARS-CoV-2 virus that causes COVID-19. Institutional protocols and algorithms that pertain to the evaluation of patients at risk for COVID-19 are in a state of rapid change based on information released by regulatory bodies including the CDC and federal and state organizations. These policies and algorithms were followed during the patient's care in the ED.   Final Clinical Impressions(s) / ED Diagnoses   Final diagnoses:  Viral syndrome  Educated About Covid-19 Virus Infection    ED Discharge Orders         Ordered    ondansetron (ZOFRAN ODT) 4 MG disintegrating tablet  Every 8 hours PRN     04/28/19 0202           Merryl Hacker, MD 04/28/19 [phone]

## 2019-05-01 ENCOUNTER — Telehealth: Payer: Self-pay | Admitting: *Deleted

## 2019-05-01 NOTE — Telephone Encounter (Signed)
Patient calls to discuss his covid19 positive results.He viewed MyChart for the results. Discussed isolation precautions-he started isolation on 04/27/19 after being seen and tested at [facility]. Encouraged to continue isolation/frequent handwashing/disinfecting items he comes in contact with. He no longer has fever. Denies SOB. Has a cough and body aches. Reviewed when to discontinue isolation, 14 days from onset of symptoms will be Aug.2  requirements in order to stop isolation including 3 consecutive days with no fever/symptoms and no antipyretics to mask fever. Stated he understood. Get plenty of rest and fluids for now.  He needs a letter stating these instructions with the isolation instructions and end date included. His PCP does not have access to these results at this time.

## 2019-05-02 LAB — NOVEL CORONAVIRUS, NAA (HOSP ORDER, SEND-OUT TO REF LAB; TAT 18-24 HRS): SARS-CoV-2, NAA: DETECTED — AB

## 2019-11-06 ENCOUNTER — Ambulatory Visit (INDEPENDENT_AMBULATORY_CARE_PROVIDER_SITE_OTHER)
Admission: RE | Admit: 2019-11-06 | Discharge: 2019-11-06 | Disposition: A | Discharge status: Home / Self Care | Payer: Managed Care, Other (non HMO) | Source: Ambulatory Visit

## 2019-11-06 DIAGNOSIS — M109 Gout, unspecified: Secondary | ICD-10-CM

## 2019-11-06 MED ORDER — INDOMETHACIN 50 MG PO CAPS: 50.0000 mg | ORAL_CAPSULE | Freq: Three times a day (TID) | ORAL | 0 refills | Status: AC

## 2019-11-06 NOTE — Discharge Instructions (Signed)
Start indomethacin as directed. Keep hydrated, urine should be clear to pale yellow in color. Elevate foot. If noticing symptoms not improving, spreading redness, fever, follow up for reevaluation. Otherwise, follow up with PCP, or in person for reevaluation if symptoms not improving.

## 2019-11-06 NOTE — ED Provider Notes (Signed)
Virtual Visit via Video Note:  [name]  initiated request for Telemedicine visit with [facility] team. I connected with [name]  on 11/06/2019 at 10:02 AM  for a synchronized telemedicine visit using a video enabled HIPPA compliant telemedicine application. I verified that I am speaking with [name]  using two identifiers. [name] Cipro, PA-C  was physically located in a [facility]gent care site and [name] was located at a different location.   The limitations of evaluation and management by telemedicine as well as the availability of in-person appointments were discussed. Patient was informed that he  may incur a bill ( including co-pay) for this virtual visit encounter. [name]  expressed understanding and gave verbal consent to proceed with virtual visit.    History of Present Illness:Francis Lin  is a 44 y.o. male presents with 4 day history of gout flare to the swelling, erythema, pain to the right great toe and dorsal aspect of the foot. Wearing a boot to help with movement. No injury/trauma. Denies any wound. Denies fever, chills, body aches. Denies decrease in ROM. Some tingling that has resolved. Has not taken anything for the symptoms.   Past Medical History:  Diagnosis Date  . Acid reflux   . DVT (deep venous thrombosis) ([place])   . Esophagitis   . Hypersomnia, persistent   . IBS (irritable bowel syndrome)   . Shifting sleep-work schedule, affecting sleep 06/20/2013  . Snoring disorder 06/20/2013    Allergies  Allergen Reactions  . Lactose Diarrhea and Other (See Comments)    Lactose intolerance  . Caffeine Other (See Comments)    "makes feel bad", headaches  . Omeprazole Other (See Comments)    Mood swing  . Peanuts [Peanut Oil]   . Penicillins Other (See Comments)    Headaches   . Seasonal Ic [Cholestatin]     Sneezing,runny nose , itchy eyes  . Lactose Intolerance ([place])         Observations/Objective: General: Well appearing,  nontoxic, no acute distress. Sitting comfortably. Head: Normocephalic, atraumatic Eye: No conjunctival injection, eyelid swelling. EOMI ENT: Mucus membranes moist, no lip cracking. No obvious nasal drainage. Pulm: Speaking in full sentences without difficulty. Normal effort. No respiratory distress, accessory muscle use. Neuro: Normal mental status. Alert and oriented x 3.  Assessment and Plan: Was unable to visualize foot on video visit. Patient with history of gout with symptoms consistent with previous gout flares. Decreased ROM of right toe, though still with movement, low suspicion for septic joint. No fever, chills, spreading erythema. At this time will avoid prednisone as unable to fully assess for cellulitis. Will treat for gout with indomethacin. However, if symptoms not improving, will need in person evaluation. Return precautions given.   Follow Up Instructions:    I discussed the assessment and treatment plan with the patient. The patient was provided an opportunity to ask questions and all were answered. The patient agreed with the plan and demonstrated an understanding of the instructions.   The patient was advised to call back or seek an in-person evaluation if the symptoms worsen or if the condition fails to improve as anticipated.  I provided 20 minutes of non-face-to-face time during this encounter.    Ok Edwards, PA-C  11/06/2019 10:02 AM         Ok Edwards, PA-C 11/06/19 1201

## 2020-01-16 ENCOUNTER — Encounter (HOSPITAL_COMMUNITY): Payer: Self-pay

## 2020-01-16 ENCOUNTER — Ambulatory Visit (INDEPENDENT_AMBULATORY_CARE_PROVIDER_SITE_OTHER): Payer: Managed Care, Other (non HMO)

## 2020-01-16 ENCOUNTER — Ambulatory Visit (HOSPITAL_COMMUNITY)
Admission: EM | Admit: 2020-01-16 | Discharge: 2020-01-16 | Disposition: A | Discharge status: Home / Self Care | Payer: Managed Care, Other (non HMO) | Attending: Family Medicine | Admitting: Family Medicine

## 2020-01-16 ENCOUNTER — Other Ambulatory Visit: Payer: Self-pay

## 2020-01-16 DIAGNOSIS — R079 Chest pain, unspecified: Secondary | ICD-10-CM

## 2020-01-16 DIAGNOSIS — S29012A Strain of muscle and tendon of back wall of thorax, initial encounter: Secondary | ICD-10-CM

## 2020-01-16 MED ORDER — TIZANIDINE HCL 4 MG PO TABS: 4.0000 mg | ORAL_TABLET | Freq: Four times a day (QID) | ORAL | 0 refills | Status: DC | PRN

## 2020-01-16 MED ORDER — PREDNISONE 20 MG PO TABS: 20.0000 mg | ORAL_TABLET | Freq: Two times a day (BID) | ORAL | 0 refills | Status: DC

## 2020-01-16 NOTE — ED Provider Notes (Signed)
[place]    CSN: [id number] Arrival date & time: 01/16/20  1102      History   Chief Complaint Chief Complaint  Patient presents with  . Back Pain  . Leg Pain    HPI Francis Lin is a 44 y.o. male.   HPI  Thoracic back pain for about 2 weeks, not getting better No cough or SOB or trouble taking deep breath Had COVID earlier and has had vaccinations Denies overuse or injury Is right handed and it is left side back Also has a wound on his right shin over a scar and does not remember trauma Also inquires about increased pigment over great toe where he got an injection  Past Medical History:  Diagnosis Date  . Acid reflux   . DVT (deep venous thrombosis) ([place])   . Esophagitis   . Hypersomnia, persistent   . IBS (irritable bowel syndrome)   . Shifting sleep-work schedule, affecting sleep 06/20/2013  . Snoring disorder 06/20/2013    Patient Active Problem List   Diagnosis Date Noted  . Migraine without aura and without status migrainosus, not intractable 11/22/2018  . Nonsustained ventricular tachycardia ([place]) 08/03/2018  . Elevated serum creatinine 06/18/2018  . Slow heart rate 06/06/2018  . Shortness of breath 04/04/2018  . Venous insufficiency 01/05/2017  . Deep vein thrombosis (DVT) of popliteal vein of right lower extremity ([place]) 06/30/2016  . S/P ORIF (open reduction internal fixation) fracture 06/22/2016  . Multiple fractures of both lower extremities 05/31/2016  . Elevated CK 05/17/2016  . Fracture of right fibula 05/17/2016  . Fracture of right tibia 05/17/2016  . Fracture of tibia, left, closed 05/17/2016  . Trauma 05/17/2016  . Situational anxiety 08/28/2014  . Unspecified vitamin D deficiency 09/22/2013  . Shifting sleep-work schedule, affecting sleep 06/20/2013  . Snoring disorder 06/20/2013  . Hypersomnia, persistent   . History of esophageal stricture 04/16/2013  . Pain in joint, ankle and foot 04/16/2013  . Gout 04/16/2013  .  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 OBESITY, CLASS II, BMI 35- 39.9 03/15/2012  . Hypertriglyceridemia 03/15/2012  . CHEST PAIN UNSPECIFIED 12/02/2008  . Dysphagia 12/02/2008    Past Surgical History:  Procedure Laterality Date  . ORIF TIBIA & FIBULA FRACTURES         Home Medications    Prior to Admission medications   Medication Sig Start Date End Date Taking? Authorizing Provider  aspirin EC 81 MG tablet Take 81 mg by mouth daily.   Yes [provider]  colchicine 0.6 MG tablet Take by mouth. 12/24/18  Yes [provider]  cetirizine (ZYRTEC) 10 MG tablet Take 1 tablet (10 mg total) by mouth daily. 04/09/15   Brewington, Tishira R, PA-C  colchicine 0.6 MG tablet Take by mouth. 12/24/18   [provider]  predniSONE (DELTASONE) 20 MG tablet Take 1 tablet (20 mg total) by mouth 2 (two) times daily with a meal. 01/16/20   Raylene Everts, MD  tiZANidine (ZANAFLEX) 4 MG tablet Take 1-2 tablets (4-8 mg total) by mouth every 6 (six) hours as needed for muscle spasms. 01/16/20   Raylene Everts, MD  topiramate (TOPAMAX) 25 MG tablet Start 1 hs, increase weekly to 4 hs 11/22/18   [provider]  triamcinolone ointment (KENALOG) 0.1 % Apply a small amount to the affected area 1 to 2 times daily as needed for pruritic rash 11/15/18   [provider]  cimetidine (TAGAMET) 200 MG tablet Take by  mouth. 01/30/19 01/16/20  [provider]    Family History Family History  Problem Relation Age of Onset  . Heart attack Father   . Arthritis Father   . Hypertension Father   . Cancer Father        multiple myeloma  . Edema Sister   . Diabetes Sister   . Bipolar disorder Sister     Social History Social History   Tobacco Use  . Smoking status: Never Smoker  . Smokeless tobacco: Never Used  Substance Use Topics  . Alcohol use: No  . Drug use: No     Allergies   Lactose, Caffeine, Omeprazole, Peanuts [peanut oil], Penicillins,  Seasonal ic [cholestatin], and Lactose intolerance (gi)   Review of Systems Review of Systems  Musculoskeletal: Positive for back pain.  Skin: Positive for wound.     Physical Exam Triage Vital Signs ED Triage Vitals  Enc Vitals Group     BP 01/16/20 1123 134/88     Pulse Rate 01/16/20 1123 85     Resp 01/16/20 1123 18     Temp 01/16/20 1123 98 F (36.7 C)     Temp Source 01/16/20 1123 Oral     SpO2 01/16/20 1123 100 %     Weight --      Height --      Head Circumference --      Peak Flow --      Pain Score 01/16/20 1120 6     Pain Loc --      Pain Edu? --      Excl. in [place]? --    No data found.  Updated Vital Signs BP 134/88 (BP Location: Right Arm)   Pulse 85   Temp 98 F (36.7 C) (Oral)   Resp 18   SpO2 100%      Physical Exam Constitutional:      General: He is not in acute distress.    Appearance: He is well-developed.     Comments: overweight  HENT:     Head: Normocephalic and atraumatic.     Mouth/Throat:     Comments: mask Eyes:     Conjunctiva/sclera: Conjunctivae normal.     Pupils: Pupils are equal, round, and reactive to light.  Cardiovascular:     Rate and Rhythm: Normal rate and regular rhythm.     Heart sounds: Normal heart sounds.  Pulmonary:     Effort: Pulmonary effort is normal. No respiratory distress.     Breath sounds: Normal breath sounds. No wheezing.    Abdominal:     General: There is no distension.     Palpations: Abdomen is soft.  Musculoskeletal:        General: Normal range of motion.     Cervical back: Normal range of motion.  Skin:    General: Skin is warm and dry.  Neurological:     Mental Status: He is alert.  Psychiatric:        Mood and Affect: Mood normal.        Behavior: Behavior normal.      UC Treatments / Results  Labs (all labs ordered are listed, but only abnormal results are displayed) Labs Reviewed - No data to display  EKG   Radiology DG Chest 2 View  Result Date: 01/16/2020 CLINICAL  DATA:  Upper chest pain EXAM: CHEST - 2 VIEW COMPARISON:  04/28/2019 FINDINGS: The heart size and mediastinal contours are within normal limits. Both lungs are clear. The visualized  skeletal structures are unremarkable. IMPRESSION: No active cardiopulmonary disease. Electronically Signed   By: Inez Catalina M.D.   On: 01/16/2020 12:27    Procedures Procedures (including critical care time)  Medications Ordered in UC Medications - No data to display  Initial Impression / Assessment and Plan / UC Course  I have reviewed the triage vital signs and the nursing notes.  Pertinent labs & imaging results that were available during my care of the patient were reviewed by me and considered in my medical decision making (see chart for details).     Patient reassured that this is a strain of the thoracic also/rhomboid area.  It is sometimes challenging to treat.  I do not see any other pathology.  He insists that he would like to have it x-rayed "for my peace of mind".  X-rays performed is negative. The small abrasion on his shin is over the friable scar tissue.  Not consequential Final Clinical Impressions(s) / UC Diagnoses   Final diagnoses:  Rhomboid muscle strain, initial encounter     Discharge Instructions     Take prednisone 2 times a day for 5 days.  Take 2 doses today Take tizanidine as needed, muscle relaxer Use ice or heat to area Gentle massage may help Return as needed    ED Prescriptions    Medication Sig Dispense Auth. Provider   predniSONE (DELTASONE) 20 MG tablet Take 1 tablet (20 mg total) by mouth 2 (two) times daily with a meal. 10 tablet Raylene Everts, MD   tiZANidine (ZANAFLEX) 4 MG tablet Take 1-2 tablets (4-8 mg total) by mouth every 6 (six) hours as needed for muscle spasms. 21 tablet Raylene Everts, MD     PDMP not reviewed this encounter.   Raylene Everts, MD 01/16/20 [phone]

## 2020-01-16 NOTE — Discharge Instructions (Signed)
Take prednisone 2 times a day for 5 days.  Take 2 doses today Take tizanidine as needed, muscle relaxer Use ice or heat to area Gentle massage may help Return as needed

## 2020-01-16 NOTE — ED Triage Notes (Signed)
Pt states he had '2500 pounds fall on him in 2017" resulting in fx to RLE and left ankle fx. Pt c/o new "sore"/abrasion to left shin area, ongoing discoloration to right great toe where he received a "steroid shot" for gout last year. Also c/o thoracic back pain for approx 1 week. Denies CP, SOB, diaphoresis, n/v.

## 2020-01-25 ENCOUNTER — Emergency Department (HOSPITAL_BASED_OUTPATIENT_CLINIC_OR_DEPARTMENT_OTHER)
Admission: EM | Admit: 2020-01-25 | Discharge: 2020-01-25 | Disposition: A | Discharge status: Home / Self Care | Payer: Managed Care, Other (non HMO) | Attending: Emergency Medicine | Admitting: Emergency Medicine

## 2020-01-25 ENCOUNTER — Other Ambulatory Visit: Payer: Self-pay

## 2020-01-25 DIAGNOSIS — M5440 Lumbago with sciatica, unspecified side: Secondary | ICD-10-CM | POA: Diagnosis not present

## 2020-01-25 DIAGNOSIS — Z7982 Long term (current) use of aspirin: Secondary | ICD-10-CM | POA: Insufficient documentation

## 2020-01-25 DIAGNOSIS — Z79899 Other long term (current) drug therapy: Secondary | ICD-10-CM | POA: Insufficient documentation

## 2020-01-25 DIAGNOSIS — M5489 Other dorsalgia: Secondary | ICD-10-CM | POA: Diagnosis present

## 2020-01-25 MED ORDER — METHYLPREDNISOLONE 4 MG PO TBPK: ORAL_TABLET | ORAL | 0 refills | Status: DC

## 2020-01-25 NOTE — Discharge Instructions (Signed)
Take 600 mg of Motrin every 8 hours for the next 5 days and then as needed.  Take 1000 mg of Tylenol 4 times a day for the next 5 days and then as needed.  Take Medrol dose pack as prescribed.  Follow-up with your primary care doctor.

## 2020-01-25 NOTE — ED Provider Notes (Signed)
[facility] EMERGENCY DEPARTMENT Provider Note   CSN: [id number] Arrival date & time: 01/25/20  1011     History Chief Complaint  Patient presents with  . Back Pain    Francis Lin is a 44 y.o. male.  The history is provided by the patient.  Back Pain Location:  Lumbar spine Quality:  Aching Radiates to: left leg. Pain severity:  Mild Onset quality:  Gradual Duration:  2 days Timing:  Intermittent Progression:  Waxing and waning Relieved by:  Nothing Worsened by:  Movement Associated symptoms: no abdominal pain, no abdominal swelling, no bladder incontinence, no bowel incontinence, no chest pain, no dysuria, no fever, no headaches, no numbness, no paresthesias, no pelvic pain, no perianal numbness, no tingling, no weakness and no weight loss        Past Medical History:  Diagnosis Date  . Acid reflux   . DVT (deep venous thrombosis) ([place])   . Esophagitis   . Hypersomnia, persistent   . IBS (irritable bowel syndrome)   . Shifting sleep-work schedule, affecting sleep 06/20/2013  . Snoring disorder 06/20/2013    Patient Active Problem List   Diagnosis Date Noted  . Migraine without aura and without status migrainosus, not intractable 11/22/2018  . Nonsustained ventricular tachycardia ([place]) 08/03/2018  . Elevated serum creatinine 06/18/2018  . Slow heart rate 06/06/2018  . Shortness of breath 04/04/2018  . Venous insufficiency 01/05/2017  . Deep vein thrombosis (DVT) of popliteal vein of right lower extremity ([place]) 06/30/2016  . S/P ORIF (open reduction internal fixation) fracture 06/22/2016  . Multiple fractures of both lower extremities 05/31/2016  . Elevated CK 05/17/2016  . Fracture of right fibula 05/17/2016  . Fracture of right tibia 05/17/2016  . Fracture of tibia, left, closed 05/17/2016  . Trauma 05/17/2016  . Situational anxiety 08/28/2014  . Unspecified vitamin D deficiency 09/22/2013  . Shifting sleep-work schedule, affecting sleep  06/20/2013  . Snoring disorder 06/20/2013  . Hypersomnia, persistent   . History of esophageal stricture 04/16/2013  . Pain in joint, ankle and foot 04/16/2013  . Gout 04/16/2013  . 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 OBESITY, CLASS II, BMI 35- 39.9 03/15/2012  . Hypertriglyceridemia 03/15/2012  . CHEST PAIN UNSPECIFIED 12/02/2008  . Dysphagia 12/02/2008    Past Surgical History:  Procedure Laterality Date  . ORIF TIBIA & FIBULA FRACTURES         Family History  Problem Relation Age of Onset  . Heart attack Father   . Arthritis Father   . Hypertension Father   . Cancer Father        multiple myeloma  . Edema Sister   . Diabetes Sister   . Bipolar disorder Sister     Social History   Tobacco Use  . Smoking status: Never Smoker  . Smokeless tobacco: Never Used  Substance Use Topics  . Alcohol use: No  . Drug use: No    Home Medications Prior to Admission medications   Medication Sig Start Date End Date Taking? Authorizing Provider  aspirin EC 81 MG tablet Take 81 mg by mouth daily.    [provider]  cetirizine (ZYRTEC) 10 MG tablet Take 1 tablet (10 mg total) by mouth daily. 04/09/15   Brewington, Tishira R, PA-C  colchicine 0.6 MG tablet Take by mouth. 12/24/18   [provider]  colchicine 0.6 MG tablet Take by mouth. 12/24/18   [provider]  methylPREDNISolone (MEDROL DOSEPAK) 4 MG TBPK tablet Follow package  insert 01/25/20   Demontrae Gilbert, DO  predniSONE (DELTASONE) 20 MG tablet Take 1 tablet (20 mg total) by mouth 2 (two) times daily with a meal. 01/16/20   Raylene Everts, MD  tiZANidine (ZANAFLEX) 4 MG tablet Take 1-2 tablets (4-8 mg total) by mouth every 6 (six) hours as needed for muscle spasms. 01/16/20   Raylene Everts, MD  topiramate (TOPAMAX) 25 MG tablet Start 1 hs, increase weekly to 4 hs 11/22/18   [provider]  triamcinolone ointment (KENALOG) 0.1 % Apply a small amount to the affected area 1 to 2 times  daily as needed for pruritic rash 11/15/18   [provider]  cimetidine (TAGAMET) 200 MG tablet Take by mouth. 01/30/19 01/16/20  [provider]    Allergies    Lactose, Caffeine, Omeprazole, Peanuts [peanut oil], Penicillins, Seasonal ic [cholestatin], and Lactose intolerance (gi)  Review of Systems   Review of Systems  Constitutional: Negative for chills, fever and weight loss.  HENT: Negative for ear pain and sore throat.   Eyes: Negative for pain and visual disturbance.  Respiratory: Negative for cough and shortness of breath.   Cardiovascular: Negative for chest pain and palpitations.  Gastrointestinal: Negative for abdominal pain, bowel incontinence and vomiting.  Genitourinary: Negative for bladder incontinence, dysuria, hematuria and pelvic pain.  Musculoskeletal: Positive for back pain. Negative for arthralgias.  Skin: Negative for color change and rash.  Neurological: Negative for tingling, seizures, syncope, weakness, numbness, headaches and paresthesias.  All other systems reviewed and are negative.   Physical Exam Updated Vital Signs  ED Triage Vitals [01/25/20 1017]  Enc Vitals Group     BP (!) 156/98     Pulse Rate 80     Resp 18     Temp 97.8 F (36.6 C)     Temp Source Oral     SpO2 99 %     Weight (!) 330 lb (149.7 kg)     Height 6' 2.5" (1.892 m)     Head Circumference      Peak Flow      Pain Score 10     Pain Loc      Pain Edu?      Excl. in [place]?     Physical Exam Vitals and nursing note reviewed.  Constitutional:      General: He is not in acute distress.    Appearance: He is well-developed. He is not ill-appearing.  HENT:     Head: Normocephalic and atraumatic.     Nose: Nose normal.     Mouth/Throat:     Mouth: Mucous membranes are moist.  Eyes:     Extraocular Movements: Extraocular movements intact.     Conjunctiva/sclera: Conjunctivae normal.     Pupils: Pupils are equal, round, and reactive to light.  Cardiovascular:       Rate and Rhythm: Normal rate and regular rhythm.     Pulses: Normal pulses.     Heart sounds: Normal heart sounds. No murmur.  Pulmonary:     Effort: Pulmonary effort is normal. No respiratory distress.     Breath sounds: Normal breath sounds.  Abdominal:     Palpations: Abdomen is soft.     Tenderness: There is no abdominal tenderness.  Musculoskeletal:        General: Tenderness present. Normal range of motion.     Cervical back: Normal range of motion and neck supple.     Comments: Tenderness to paraspinal muscles on the  left, no midline spinal tenderness  Skin:    General: Skin is warm and dry.     Capillary Refill: Capillary refill takes less than 2 seconds.  Neurological:     General: No focal deficit present.     Mental Status: He is alert.     Sensory: No sensory deficit.     Motor: No weakness.     Comments: 5+ out of 5 strength throughout, normal sensation     ED Results / Procedures / Treatments   Labs (all labs ordered are listed, but only abnormal results are displayed) Labs Reviewed - No data to display  EKG None  Radiology No results found.  Procedures Procedures (including critical care time)  Medications Ordered in ED Medications - No data to display  ED Course  I have reviewed the triage vital signs and the nursing notes.  Pertinent labs & imaging results that were available during my care of the patient were reviewed by me and considered in my medical decision making (see chart for details).    MDM Rules/Calculators/A&P                      QUSAI KEM is a 44 year old male with no significant medical history presents the ED with left lower back pain.  Appears that he has likely sciatic type pain.  No midline spinal tenderness.  No history of trauma.  Neurologically he is intact.  No concern for cauda equina.  No saddle anesthesia, urinary retention.  Will prescribe Medrol Dosepak.  Recommend Tylenol, Motrin, follow-up with primary care  doctor.  Discharged in ED in good condition.  Given return precautions.  This chart was dictated using voice recognition software.  Despite best efforts to proofread,  errors can occur which can change the documentation meaning.    Final Clinical Impression(s) / ED Diagnoses Final diagnoses:  Acute left-sided low back pain with sciatica, sciatica laterality unspecified    Rx / DC Orders ED Discharge Orders         Ordered    methylPREDNISolone (MEDROL DOSEPAK) 4 MG TBPK tablet     01/25/20 1031           Curatolo, Walker, DO 01/25/20 1034

## 2020-01-25 NOTE — ED Triage Notes (Signed)
L low back pain x 2 days radiating down L leg.

## 2020-02-14 ENCOUNTER — Other Ambulatory Visit: Payer: Self-pay

## 2020-02-17 ENCOUNTER — Encounter: Payer: Self-pay | Admitting: Family Medicine

## 2020-02-17 ENCOUNTER — Ambulatory Visit: Payer: Managed Care, Other (non HMO) | Admitting: Family Medicine

## 2020-02-17 ENCOUNTER — Other Ambulatory Visit: Payer: Self-pay

## 2020-02-17 VITALS — BP 134/76 | HR 66 | Temp 97.6°F | Wt 331.5 lb

## 2020-02-17 DIAGNOSIS — Z0000 Encounter for general adult medical examination without abnormal findings: Secondary | ICD-10-CM

## 2020-02-17 DIAGNOSIS — Z125 Encounter for screening for malignant neoplasm of prostate: Secondary | ICD-10-CM

## 2020-02-17 DIAGNOSIS — H6123 Impacted cerumen, bilateral: Secondary | ICD-10-CM

## 2020-02-17 DIAGNOSIS — M1A00X0 Idiopathic chronic gout, unspecified site, without tophus (tophi): Secondary | ICD-10-CM | POA: Diagnosis not present

## 2020-02-17 DIAGNOSIS — M1A9XX0 Chronic gout, unspecified, without tophus (tophi): Secondary | ICD-10-CM | POA: Diagnosis not present

## 2020-02-17 LAB — HEPATIC FUNCTION PANEL
ALT: 15 U/L (ref 0–53)
AST: 17 U/L (ref 0–37)
Albumin: 3.9 g/dL (ref 3.5–5.2)
Alkaline Phosphatase: 74 U/L (ref 39–117)
Bilirubin, Direct: 0.1 mg/dL (ref 0.0–0.3)
Total Bilirubin: 0.5 mg/dL (ref 0.2–1.2)
Total Protein: 6.7 g/dL (ref 6.0–8.3)

## 2020-02-17 LAB — CBC WITH DIFFERENTIAL/PLATELET
Basophils Absolute: 0 10*3/uL (ref 0.0–0.1)
Basophils Relative: 0.8 % (ref 0.0–3.0)
Eosinophils Absolute: 0.2 10*3/uL (ref 0.0–0.7)
Eosinophils Relative: 3 % (ref 0.0–5.0)
HCT: 43.4 % (ref 39.0–52.0)
Hemoglobin: 14.8 g/dL (ref 13.0–17.0)
Lymphocytes Relative: 30.7 % (ref 12.0–46.0)
Lymphs Abs: 1.8 10*3/uL (ref 0.7–4.0)
MCHC: 34.1 g/dL (ref 30.0–36.0)
MCV: 89.2 fl (ref 78.0–100.0)
Monocytes Absolute: 0.6 10*3/uL (ref 0.1–1.0)
Monocytes Relative: 10.6 % (ref 3.0–12.0)
Neutro Abs: 3.2 10*3/uL (ref 1.4–7.7)
Neutrophils Relative %: 54.9 % (ref 43.0–77.0)
Platelets: 248 10*3/uL (ref 150.0–400.0)
RBC: 4.86 Mil/uL (ref 4.22–5.81)
RDW: 14.5 % (ref 11.5–15.5)
WBC: 5.9 10*3/uL (ref 4.0–10.5)

## 2020-02-17 LAB — BASIC METABOLIC PANEL
BUN: 16 mg/dL (ref 6–23)
CO2: 28 mEq/L (ref 19–32)
Calcium: 8.8 mg/dL (ref 8.4–10.5)
Chloride: 105 mEq/L (ref 96–112)
Creatinine, Ser: 1.44 mg/dL (ref 0.40–1.50)
GFR: 64.59 mL/min (ref >60.00)
Glucose, Bld: 90 mg/dL (ref 70–99)
Potassium: 4.1 mEq/L (ref 3.5–5.1)
Sodium: 138 mEq/L (ref 135–145)

## 2020-02-17 LAB — LIPID PANEL
Cholesterol: 128 mg/dL (ref 0–200)
HDL: 33.3 mg/dL — ABNORMAL LOW (ref >39.00)
LDL Cholesterol: 80 mg/dL (ref 0–99)
NonHDL: 94.85
Total CHOL/HDL Ratio: 4
Triglycerides: 76 mg/dL (ref 0.0–149.0)
VLDL: 15.2 mg/dL (ref 0.0–40.0)

## 2020-02-17 LAB — URIC ACID: Uric Acid, Serum: 9.3 mg/dL — ABNORMAL HIGH (ref 4.0–7.8)

## 2020-02-17 LAB — HEMOGLOBIN A1C: Hgb A1c MFr Bld: 5.5 % (ref 4.6–6.5)

## 2020-02-17 LAB — TSH: TSH: 2.21 u[IU]/mL (ref 0.35–4.50)

## 2020-02-17 LAB — PSA: PSA: 0.44 ng/mL (ref 0.10–4.00)

## 2020-02-17 NOTE — Progress Notes (Signed)
Subjective:     Patient ID: Francis Lin, male   DOB: [birth year], 44 y.o.   MRN: [id number]  HPI   Francis Lin is seen to establish care and for physical exam.  Past medical history reviewed.  He has history of migraine headaches, venous insufficiency, history of DVT following trauma right lower extremity, chronic gout, history of esophageal stricture and GERD, dyslipidemia.  Only current medication is meloxicam 15 mg daily.  He is taking colchicine as needed for gout flareups.  Has not been on any prophylaxis for gout.  He states his gout usually flares about once every 2 months and usually involves the feet.  He had trauma 2017.  He states about 2500 pounds fell on him with fractures right lower extremity and left ankle.  He had right lower extremity acute DVT at that time and was treated briefly with Xarelto.  No DVT since then.  His migraines are infrequent.  He saw correlation with triggers with chocolate and dairy products.  With elimination he has done very well with that.  He had been prescribed Topamax at 1 point but never took that.  He works with Francis Lin in distribution.  He is married and this is a second marriage.  He has 7 children total between both marriages.  Never smoked.  No alcohol.  Past Medical History:  Diagnosis Date  . Acid reflux   . DVT (deep venous thrombosis) ([place])   . Esophagitis   . Hypersomnia, persistent   . IBS (irritable bowel syndrome)   . Shifting sleep-work schedule, affecting sleep 06/20/2013  . Snoring disorder 06/20/2013   Past Surgical History:  Procedure Laterality Date  . ORIF TIBIA & FIBULA FRACTURES      reports that he has never smoked. He has never used smokeless tobacco. He reports that he does not drink alcohol or use drugs. family history includes Arthritis in his father; Bipolar disorder in his sister; Cancer in his father; Diabetes in his father and sister; Edema in his sister; Heart attack in his father; Hypertension in his father; Lupus  in his mother. Allergies  Allergen Reactions  . Lactose Diarrhea and Other (See Comments)    Lactose intolerance Lactose intolerance Lactose intolerance  . Caffeine Other (See Comments)    "makes feel bad", headaches  . Omeprazole Other (See Comments)    Mood swing  . Peanuts [Peanut Oil]   . Penicillins Other (See Comments)    Headaches   . Seasonal Ic [Cholestatin]     Sneezing,runny nose , itchy eyes  . Lactose Intolerance (Gi)      Review of Systems  Constitutional: Negative for activity change, appetite change, fatigue and fever.  HENT: Negative for congestion, ear pain and trouble swallowing.   Eyes: Negative for pain and visual disturbance.  Respiratory: Negative for cough, shortness of breath and wheezing.   Cardiovascular: Negative for chest pain and palpitations.  Gastrointestinal: Negative for abdominal distention, abdominal pain, blood in stool, constipation, diarrhea, nausea, rectal pain and vomiting.  Endocrine: Negative for polydipsia and polyuria.  Genitourinary: Negative for dysuria, hematuria and testicular pain.  Musculoskeletal: Negative for joint swelling.  Skin: Negative for rash.  Neurological: Negative for dizziness, syncope and weakness.  Hematological: Negative for adenopathy.  Psychiatric/Behavioral: Negative for confusion and dysphoric mood.       Objective:   Physical Exam Constitutional:      General: He is not in acute distress.    Appearance: He is well-developed.  HENT:  Head: Normocephalic and atraumatic.     Comments: He has cerumen in both ear canals Eyes:     Conjunctiva/sclera: Conjunctivae normal.     Pupils: Pupils are equal, round, and reactive to light.  Neck:     Thyroid: No thyromegaly.  Cardiovascular:     Rate and Rhythm: Normal rate and regular rhythm.     Heart sounds: Normal heart sounds. No murmur.  Pulmonary:     Effort: No respiratory distress.     Breath sounds: No wheezing or rales.  Abdominal:      General: Bowel sounds are normal. There is no distension.     Palpations: Abdomen is soft. There is no mass.     Tenderness: There is no abdominal tenderness. There is no guarding or rebound.  Musculoskeletal:     Cervical back: Normal range of motion and neck supple.     Right lower leg: No edema.     Left lower leg: No edema.  Lymphadenopathy:     Cervical: No cervical adenopathy.  Skin:    Findings: No rash.  Neurological:     Mental Status: He is alert and oriented to person, place, and time.     Cranial Nerves: No cranial nerve deficit.     Deep Tendon Reflexes: Reflexes normal.        Assessment:     44 year old male with multiple chronic problems as above who is seen today to establish care.  He is requesting screening labs.  He is fasting today.  He has bilateral cerumen impactions which were addressed as below    Plan:     -We will check basic screening labs.  We will also go ahead and get baseline uric acid level and A1c with his strong family history of type 2 diabetes.  -Patient requesting ear irrigation for cerumen impaction.  Discussed risk of irrigation including risk of pain, bleeding, low risk of eardrum perforation.  Both ear canals were irrigated and he tolerated well.  Right was fully cleared of cerumen and left required curette to remove a large piece of cerumen  -We discussed possible prevention of his gout with allopurinol but at this point he would like to wait  -Encourage weight loss.  Francis Post MD [facility]

## 2020-02-17 NOTE — Patient Instructions (Signed)
Preventive Care 44-44 Years Old, Male Preventive care refers to lifestyle choices and visits with your health care provider that can promote health and wellness. This includes:  A yearly physical exam. This is also called an annual well check.  Regular dental and eye exams.  Immunizations.  Screening for certain conditions.  Healthy lifestyle choices, such as eating a healthy diet, getting regular exercise, not using drugs or products that contain nicotine and tobacco, and limiting alcohol use. What can I expect for my preventive care visit? Physical exam Your health care provider will check:  Height and weight. These may be used to calculate body mass index (BMI), which is a measurement that tells if you are at a healthy weight.  Heart rate and blood pressure.  Your skin for abnormal spots. Counseling Your health care provider may ask you questions about:  Alcohol, tobacco, and drug use.  Emotional well-being.  Home and relationship well-being.  Sexual activity.  Eating habits.  Work and work Statistician. What immunizations do I need?  Influenza (flu) vaccine  This is recommended every year. Tetanus, diphtheria, and pertussis (Tdap) vaccine  You may need a Td booster every 10 years. Varicella (chickenpox) vaccine  You may need this vaccine if you have not already been vaccinated. Zoster (shingles) vaccine  You may need this after age 44. Measles, mumps, and rubella (MMR) vaccine  You may need at least one dose of MMR if you were born in 1957 or later. You may also need a second dose. Pneumococcal conjugate (PCV13) vaccine  You may need this if you have certain conditions and were not previously vaccinated. Pneumococcal polysaccharide (PPSV23) vaccine  You may need one or two doses if you smoke cigarettes or if you have certain conditions. Meningococcal conjugate (MenACWY) vaccine  You may need this if you have certain conditions. Hepatitis A  vaccine  You may need this if you have certain conditions or if you travel or work in places where you may be exposed to hepatitis A. Hepatitis B vaccine  You may need this if you have certain conditions or if you travel or work in places where you may be exposed to hepatitis B. Haemophilus influenzae type b (Hib) vaccine  You may need this if you have certain risk factors. Human papillomavirus (HPV) vaccine  If recommended by your health care provider, you may need three doses over 6 months. You may receive vaccines as individual doses or as more than one vaccine together in one shot (combination vaccines). Talk with your health care provider about the risks and benefits of combination vaccines. What tests do I need? Blood tests  Lipid and cholesterol levels. These may be checked every 5 years, or more frequently if you are over 60 years old.  Hepatitis C test.  Hepatitis B test. Screening  Lung cancer screening. You may have this screening every year starting at age 44 if you have a 30-pack-year history of smoking and currently smoke or have quit within the past 15 years.  Prostate cancer screening. Recommendations will vary depending on your family history and other risks.  Colorectal cancer screening. All adults should have this screening starting at age 44 and continuing until age 2. Your health care provider may recommend screening at age 44 if you are at increased risk. You will have tests every 1-10 years, depending on your results and the type of screening test.  Diabetes screening. This is done by checking your blood sugar (glucose) after you have not eaten  for a while (fasting). You may have this done every 1-3 years.  Sexually transmitted disease (STD) testing. Follow these instructions at home: Eating and drinking  Eat a diet that includes fresh fruits and vegetables, whole grains, lean protein, and low-fat dairy products.  Take vitamin and mineral supplements as  recommended by your health care provider.  Do not drink alcohol if your health care provider tells you not to drink.  If you drink alcohol: ? Limit how much you have to 0-2 drinks a day. ? Be aware of how much alcohol is in your drink. In the [place] one drink equals one 12 oz bottle of beer (355 mL), one 5 oz glass of wine (148 mL), or one 1 oz glass of hard liquor (44 mL). Lifestyle  Take daily care of your teeth and gums.  Stay active. Exercise for at least 30 minutes on 5 or more days each week.  Do not use any products that contain nicotine or tobacco, such as cigarettes, e-cigarettes, and chewing tobacco. If you need help quitting, ask your health care provider.  If you are sexually active, practice safe sex. Use a condom or other form of protection to prevent STIs (sexually transmitted infections).  Talk with your health care provider about taking a low-dose aspirin every day starting at age 44. What's next?  Go to your health care provider once a year for a well check visit.  Ask your health care provider how often you should have your eyes and teeth checked.  Stay up to date on all vaccines. This information is not intended to replace advice given to you by your health care provider. Make sure you discuss any questions you have with your health care provider. Document Revised: 09/20/2018 Document Reviewed: 09/20/2018 Elsevier Patient Education  2020 [employer].

## 2020-02-24 ENCOUNTER — Telehealth: Payer: Self-pay | Admitting: Family Medicine

## 2020-02-24 DIAGNOSIS — M549 Dorsalgia, unspecified: Secondary | ICD-10-CM

## 2020-02-24 NOTE — Telephone Encounter (Signed)
Referral has been placed. 

## 2020-02-24 NOTE — Addendum Note (Signed)
Addended by: Modena Morrow R on: 02/24/2020 03:01 PM   Modules accepted: Orders

## 2020-02-24 NOTE — Telephone Encounter (Signed)
Please advise 

## 2020-02-24 NOTE — Telephone Encounter (Signed)
Pt would like to have a referral to Dr. Tomi Likens (Neurologist).  For his gout and back pain that was discussed with Dr. Elease Hashimoto.

## 2020-02-24 NOTE — Telephone Encounter (Signed)
We can set up referral to Dr. Tomi Likens for the back pain but Dr. Tomi Likens will not be managing gout as he is neurologist.  We can manage the gout and as mentioned could start allopurinol if he has concerns for future flareups of he is having frequent flareups

## 2020-02-25 NOTE — Telephone Encounter (Signed)
lvm for pt to call back Dr.Jaffe does not see pts for back pain need to know if pt has tried physical therapy in the past ?

## 2020-02-26 NOTE — Telephone Encounter (Signed)
When we saw him recently we were really focusing on getting established and did not spend a lot of time talking about his back issues.  I suggest schedule office follow-up and make this a 30-minute follow-up and we will assess and if necessary can get some plain film imaging here in the office

## 2020-02-26 NOTE — Telephone Encounter (Signed)
Pt states states he wants to get imaging done for his neck and back. Pt states that he has not done physical therapy for his back issues

## 2020-02-26 NOTE — Telephone Encounter (Signed)
Pt has been scheduled for 30 minute appt

## 2020-02-27 ENCOUNTER — Ambulatory Visit: Payer: Managed Care, Other (non HMO) | Admitting: Family Medicine

## 2020-03-05 ENCOUNTER — Other Ambulatory Visit: Payer: Self-pay

## 2020-03-06 ENCOUNTER — Ambulatory Visit (INDEPENDENT_AMBULATORY_CARE_PROVIDER_SITE_OTHER): Payer: Managed Care, Other (non HMO) | Admitting: Family Medicine

## 2020-03-06 ENCOUNTER — Ambulatory Visit (INDEPENDENT_AMBULATORY_CARE_PROVIDER_SITE_OTHER)
Admission: RE | Admit: 2020-03-06 | Discharge: 2020-03-06 | Disposition: A | Discharge status: Home / Self Care | Payer: Managed Care, Other (non HMO) | Source: Ambulatory Visit | Attending: Family Medicine | Admitting: Family Medicine

## 2020-03-06 ENCOUNTER — Encounter: Payer: Self-pay | Admitting: Family Medicine

## 2020-03-06 VITALS — BP 130/72 | HR 79 | Temp 98.2°F | Ht 74.5 in | Wt 332.0 lb

## 2020-03-06 DIAGNOSIS — L239 Allergic contact dermatitis, unspecified cause: Secondary | ICD-10-CM | POA: Diagnosis not present

## 2020-03-06 DIAGNOSIS — M542 Cervicalgia: Secondary | ICD-10-CM

## 2020-03-06 DIAGNOSIS — M545 Low back pain: Secondary | ICD-10-CM | POA: Diagnosis not present

## 2020-03-06 DIAGNOSIS — G8929 Other chronic pain: Secondary | ICD-10-CM | POA: Diagnosis not present

## 2020-03-06 NOTE — Progress Notes (Signed)
Subjective:     Patient ID: Francis Lin, male   DOB: 05-Jan-1976, 44 y.o.   MRN: [id number]  HPI   Francis Lin is seen to discuss some chronic low back pain and also some neck pain.  He just recently established care.  We did not spend a lot of time talking about his back pain at that time.  He called back requesting some things and we recommended follow-up to discuss further.  He states that he has had actually several years of left lumbar back pain but especially worsening since April.  No known injury.  No definite weakness.  He has occasional tingling sensation in his toes and feet.  Occasional sharp pain radiating down the left lower extremity.  No loss of urine or stool control.  He has taken Mobic 15 mg a day with mild relief.  He denies any prior lumbar imaging.  He also complains of some left neck pains which are especially worse with lateral bending to the left.  This causes sometimes sharp pain to go down the left upper extremity.  Pain is very intermittent but sometimes severe.  This has been present for several months.  He is right-hand dominant  Separate issue of some skin rash left anterior thigh.  He has some blisters which are pruritic and nonpainful.  He first noticed this earlier this week.  Past Medical History:  Diagnosis Date  . Acid reflux   . DVT (deep venous thrombosis) ([place])   . Esophagitis   . Hypersomnia, persistent   . IBS (irritable bowel syndrome)   . Shifting sleep-work schedule, affecting sleep 06/20/2013  . Snoring disorder 06/20/2013   Past Surgical History:  Procedure Laterality Date  . ORIF TIBIA & FIBULA FRACTURES      reports that he has never smoked. He has never used smokeless tobacco. He reports that he does not drink alcohol or use drugs. family history includes Arthritis in his father; Bipolar disorder in his sister; Cancer in his father; Diabetes in his father and sister; Edema in his sister; Heart attack in his father; Hypertension in his father;  Lupus in his mother. Allergies  Allergen Reactions  . Lactose Diarrhea and Other (See Comments)    Lactose intolerance Lactose intolerance Lactose intolerance  . Caffeine Other (See Comments)    "makes feel bad", headaches  . Omeprazole Other (See Comments)    Mood swing  . Peanuts [Peanut Oil]   . Penicillins Other (See Comments)    Headaches   . Seasonal Ic [Cholestatin]     Sneezing,runny nose , itchy eyes  . Lactose Intolerance (Gi)      Review of Systems  Constitutional: Negative for activity change, appetite change, chills and fever.  Respiratory: Negative for cough and shortness of breath.   Cardiovascular: Negative for chest pain and leg swelling.  Gastrointestinal: Negative for abdominal pain and vomiting.  Genitourinary: Negative for dysuria, flank pain and hematuria.  Musculoskeletal: Positive for back pain and neck pain. Negative for joint swelling.  Skin: Positive for rash.  Neurological: Negative for weakness and numbness.       Objective:   Physical Exam Vitals reviewed.  Constitutional:      Appearance: Normal appearance.  Cardiovascular:     Rate and Rhythm: Normal rate and regular rhythm.  Pulmonary:     Effort: Pulmonary effort is normal.     Breath sounds: Normal breath sounds.  Musculoskeletal:     Comments: Good range of motion both hips.  He  has some mild left lower lumbar tenderness.  Good range of motion cervical spine.  Mild pain with lateral bending to the left side  Skin:    Findings: Rash present.     Comments: Some vesicles some in linear distribution left anterior thigh.  Nontender.  No surrounding erythema.  Neurological:     General: No focal deficit present.     Mental Status: He is alert.     Comments: Deep tendon reflexes are 2+ knee and 1+ ankle bilaterally.  He has full strength lower extremities  Symmetric upper extremity reflexes and full strength throughout        Assessment:     #1 chronic lumbar back pain for  several years.  He describes some intermittent left radiculitis symptoms but nonfocal neuro exam at this time  #2 intermittent left neck pain.  He likewise is describing some intermittent radiculitis symptoms but nonfocal neuro exam.  #3 probable contact dermatitis rash left anterior thigh    Plan:     -We discussed options for treating his left thigh rash and he would like to try some over-the-counter hydrocortisone cream to start with.  We discussed possible oral prednisone but he declines at this time  -Patient requesting imaging for his neck and back pain.  We will start with plain films given duration of symptoms.  We had previously discussed physical therapy but he would like to get some imaging first  [name] MD [facility] at [facility]

## 2020-03-06 NOTE — Patient Instructions (Signed)
Radicular Pain Radicular pain is a type of pain that spreads from your back or neck along a spinal nerve. Spinal nerves are nerves that leave the spinal cord and go to the muscles. Radicular pain is sometimes called radiculopathy, radiculitis, or a pinched nerve. When you have this type of pain, you may also have weakness, numbness, or tingling in the area of your body that is supplied by the nerve. The pain may feel sharp and burning. Depending on which spinal nerve is affected, the pain may occur in the:  Neck area (cervical radicular pain). You may also feel pain, numbness, weakness, or tingling in the arms.  Mid-spine area (thoracic radicular pain). You would feel this pain in the back and chest. This type is rare.  Lower back area (lumbar radicular pain). You would feel this pain as low back pain. You may feel pain, numbness, weakness, or tingling in the buttocks or legs. Sciatica is a type of lumbar radicular pain that shoots down the back of the leg. Radicular pain occurs when one of the spinal nerves becomes irritated or squeezed (compressed). It is often caused by something pushing on a spinal nerve, such as one of the bones of the spine (vertebrae) or one of the round cushions between vertebrae (intervertebral disks). This can result from:  An injury.  Wear and tear or aging of a disk.  The growth of a bone spur that pushes on the nerve. Radicular pain often goes away when you follow instructions from your health care provider for relieving pain at home. Follow these instructions at home: Managing pain      If directed, put ice on the affected area: ? Put ice in a plastic bag. ? Place a towel between your skin and the bag. ? Leave the ice on for 20 minutes, 2-3 times a day.  If directed, apply heat to the affected area as often as told by your health care provider. Use the heat source that your health care provider recommends, such as a moist heat pack or a heating pad. ? Place  a towel between your skin and the heat source. ? Leave the heat on for 20-30 minutes. ? Remove the heat if your skin turns bright red. This is especially important if you are unable to feel pain, heat, or cold. You may have a greater risk of getting burned. Activity   Do not sit or rest in bed for long periods of time.  Try to stay as active as possible. Ask your health care provider what type of exercise or activity is best for you.  Avoid activities that make your pain worse, such as bending and lifting.  Do not lift anything that is heavier than 10 lb (4.5 kg), or the limit that you are told, until your health care provider says that it is safe.  Practice using proper technique when lifting items. Proper lifting technique involves bending your knees and rising up.  Do strength and range-of-motion exercises only as told by your health care provider or physical therapist. General instructions  Take over-the-counter and prescription medicines only as told by your health care provider.  Pay attention to any changes in your symptoms.  Keep all follow-up visits as told by your health care provider. This is important. ? Your health care provider may send you to a physical therapist to help with this pain. Contact a health care provider if:  Your pain and other symptoms get worse.  Your pain medicine is not   helping.  Your pain has not improved after a few weeks of home care.  You have a fever. Get help right away if:  You have severe pain, weakness, or numbness.  You have difficulty with bladder or bowel control. Summary  Radicular pain is a type of pain that spreads from your back or neck along a spinal nerve.  When you have radicular pain, you may also have weakness, numbness, or tingling in the area of your body that is supplied by the nerve.  The pain may feel sharp or burning.  Radicular pain may be treated with ice, heat, medicines, or physical therapy. This  information is not intended to replace advice given to you by your health care provider. Make sure you discuss any questions you have with your health care provider. Document Revised: 04/10/2018 Document Reviewed: 04/10/2018 Elsevier Patient Education  2020 [employer]  

## 2020-03-22 ENCOUNTER — Encounter (HOSPITAL_BASED_OUTPATIENT_CLINIC_OR_DEPARTMENT_OTHER): Payer: Self-pay

## 2020-03-22 ENCOUNTER — Emergency Department (HOSPITAL_BASED_OUTPATIENT_CLINIC_OR_DEPARTMENT_OTHER)
Admission: EM | Admit: 2020-03-22 | Discharge: 2020-03-23 | Disposition: A | Discharge status: Home / Self Care | Payer: Managed Care, Other (non HMO) | Attending: Emergency Medicine | Admitting: Emergency Medicine

## 2020-03-22 ENCOUNTER — Other Ambulatory Visit: Payer: Self-pay

## 2020-03-22 ENCOUNTER — Emergency Department (HOSPITAL_BASED_OUTPATIENT_CLINIC_OR_DEPARTMENT_OTHER): Payer: Managed Care, Other (non HMO)

## 2020-03-22 DIAGNOSIS — R200 Anesthesia of skin: Secondary | ICD-10-CM | POA: Insufficient documentation

## 2020-03-22 DIAGNOSIS — Z20822 Contact with and (suspected) exposure to covid-19: Secondary | ICD-10-CM | POA: Insufficient documentation

## 2020-03-22 DIAGNOSIS — R072 Precordial pain: Secondary | ICD-10-CM

## 2020-03-22 DIAGNOSIS — Z91010 Allergy to peanuts: Secondary | ICD-10-CM | POA: Diagnosis not present

## 2020-03-22 DIAGNOSIS — R519 Headache, unspecified: Secondary | ICD-10-CM | POA: Insufficient documentation

## 2020-03-22 LAB — BASIC METABOLIC PANEL
Anion gap: 10 (ref 5–15)
BUN: 14 mg/dL (ref 6–20)
CO2: 26 mmol/L (ref 22–32)
Calcium: 8.9 mg/dL (ref 8.9–10.3)
Chloride: 102 mmol/L (ref 98–111)
Creatinine, Ser: 1.64 mg/dL — ABNORMAL HIGH (ref 0.61–1.24)
GFR calc Af Amer: 58 mL/min — ABNORMAL LOW (ref >60)
GFR calc non Af Amer: 50 mL/min — ABNORMAL LOW (ref >60)
Glucose, Bld: 97 mg/dL (ref 70–99)
Potassium: 3.9 mmol/L (ref 3.5–5.1)
Sodium: 138 mmol/L (ref 135–145)

## 2020-03-22 LAB — CBC
HCT: 45.6 % (ref 39.0–52.0)
Hemoglobin: 15.1 g/dL (ref 13.0–17.0)
MCH: 29.5 pg (ref 26.0–34.0)
MCHC: 33.1 g/dL (ref 30.0–36.0)
MCV: 89.1 fL (ref 80.0–100.0)
Platelets: 249 10*3/uL (ref 150–400)
RBC: 5.12 MIL/uL (ref 4.22–5.81)
RDW: 13.9 % (ref 11.5–15.5)
WBC: 8.5 10*3/uL (ref 4.0–10.5)
nRBC: 0 % (ref 0.0–0.2)

## 2020-03-22 LAB — HEPATIC FUNCTION PANEL
ALT: 22 U/L (ref 0–44)
AST: 20 U/L (ref 15–41)
Albumin: 4.2 g/dL (ref 3.5–5.0)
Alkaline Phosphatase: 77 U/L (ref 38–126)
Bilirubin, Direct: <0.1 mg/dL (ref 0.0–0.2)
Total Bilirubin: 0.7 mg/dL (ref 0.3–1.2)
Total Protein: 7.8 g/dL (ref 6.5–8.1)

## 2020-03-22 LAB — TROPONIN I (HIGH SENSITIVITY)
Troponin I (High Sensitivity): 2 ng/L (ref <18)
Troponin I (High Sensitivity): 2 ng/L (ref <18)

## 2020-03-22 LAB — SARS CORONAVIRUS 2 BY RT PCR (HOSPITAL ORDER, PERFORMED IN ~~LOC~~ HOSPITAL LAB): SARS Coronavirus 2: NEGATIVE

## 2020-03-22 LAB — LIPASE, BLOOD: Lipase: 21 U/L (ref 11–51)

## 2020-03-22 MED ORDER — ONDANSETRON HCL 4 MG/2ML IJ SOLN: 4.0000 mg | Freq: Once | INTRAMUSCULAR | Status: DC

## 2020-03-22 MED ORDER — NITROGLYCERIN 0.4 MG SL SUBL
0.4000 mg | SUBLINGUAL_TABLET | SUBLINGUAL | Status: DC | PRN
  Administered 2020-03-22: 0.4 mg via SUBLINGUAL
  Filled 2020-03-22: qty 1

## 2020-03-22 MED ORDER — ASPIRIN 81 MG PO CHEW
324.0000 mg | CHEWABLE_TABLET | Freq: Once | ORAL | Status: AC
  Administered 2020-03-22: 324 mg via ORAL
  Filled 2020-03-22: qty 4

## 2020-03-22 NOTE — ED Triage Notes (Signed)
Pt arrived from [facility] for MRI.  Charge nurse made aware.

## 2020-03-22 NOTE — ED Notes (Signed)
Pt taken to MRI  

## 2020-03-22 NOTE — ED Triage Notes (Signed)
Pt reports he was sitting today and started having numbness in his left arm, reports he broke out in a cold sweat and was having shakes. Pt reports taking Advil and Pepto states that he then vomited. Pt also reports feeling dizzy and having back pain.

## 2020-03-22 NOTE — ED Notes (Signed)
Plan to transfer POV to [facility] ED - per Dr. Laverta Baltimore, pt doing well and does not need to be transported by [facility]. Wife to drive to ED.

## 2020-03-22 NOTE — ED Notes (Signed)
Attempted IV x2 in right antebucital. One attempt made with ultrasound without success. Another staff member to attempt.

## 2020-03-22 NOTE — Discharge Instructions (Addendum)
You were seen in the emergency room today with chest pain, vomiting, headache, numbness in the face and left arm.  I am sending you to the [facility] emergency department for MRI.  If this is normal we will have you follow closely with both your primary care doctor as well as the cardiology team.  I have placed a referral in the system to help with this appointment.  If you develop new or suddenly worsening symptoms you should return to the emergency department immediately.

## 2020-03-22 NOTE — ED Notes (Signed)
Pt reports numbness has gotten better, using arm in triage.

## 2020-03-22 NOTE — ED Provider Notes (Signed)
Emergency Department Provider Note   I have reviewed the triage vital signs and the nursing notes.   HISTORY  Chief Complaint Numbness   HPI Francis Lin is a 44 y.o. male with past medical history reviewed below presents to the emergency department with acute onset chest tightness with headache, left arm discomfort/numbness.  Symptoms began this morning while at church.  Patient worked overnight and slept for a couple hours prior to going to church.  He states he developed some mild headache and felt some fatigue symptoms.  He went to lunch and around that time developed central chest tightness which is radiating slightly left of center.  He developed some discomfort in the left shoulder and had a numbness sensation in the top of his left arm. Last normal 10 AM at church. He went to be in the bathroom to have a bowel movement at which time he became very diaphoretic and lightheaded.  His chest pain continued and he became very nauseated.  He did have one episode of nonbloody emesis. He denies any sudden severe headache symptoms but continues to have mild headache.  No neck pain worse than his baseline discomfort.  His numb feeling has improved but he continues to have some central chest tightness at the time of my exam.  No similar symptoms in the past.  No prior history of ACS.   Past Medical History:  Diagnosis Date  . Acid reflux   . DVT (deep venous thrombosis) ([place])   . Esophagitis   . Hypersomnia, persistent   . IBS (irritable bowel syndrome)   . Shifting sleep-work schedule, affecting sleep 06/20/2013  . Snoring disorder 06/20/2013    Patient Active Problem List   Diagnosis Date Noted  . Migraine without aura and without status migrainosus, not intractable 11/22/2018  . Nonsustained ventricular tachycardia ([place]) 08/03/2018  . Elevated serum creatinine 06/18/2018  . Slow heart rate 06/06/2018  . Shortness of breath 04/04/2018  . Venous insufficiency 01/05/2017  . Deep  vein thrombosis (DVT) of popliteal vein of right lower extremity ([place]) 06/30/2016  . S/P ORIF (open reduction internal fixation) fracture 06/22/2016  . Multiple fractures of both lower extremities 05/31/2016  . Elevated CK 05/17/2016  . Fracture of right fibula 05/17/2016  . Fracture of right tibia 05/17/2016  . Fracture of tibia, left, closed 05/17/2016  . Trauma 05/17/2016  . Situational anxiety 08/28/2014  . Unspecified vitamin D deficiency 09/22/2013  . Shifting sleep-work schedule, affecting sleep 06/20/2013  . Snoring disorder 06/20/2013  . Hypersomnia, persistent   . History of esophageal stricture 04/16/2013  . Pain in joint, ankle and foot 04/16/2013  . Gout 04/16/2013  . 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 OBESITY, CLASS II, BMI 35- 39.9 03/15/2012  . Hypertriglyceridemia 03/15/2012  . CHEST PAIN UNSPECIFIED 12/02/2008  . Dysphagia 12/02/2008    Past Surgical History:  Procedure Laterality Date  . ORIF TIBIA & FIBULA FRACTURES      Allergies Lactose, Caffeine, Omeprazole, Peanuts [peanut oil], Penicillins, Seasonal ic [cholestatin], and Lactose intolerance (gi)  Family History  Problem Relation Age of Onset  . Lupus Mother   . Heart attack Father   . Arthritis Father   . Hypertension Father   . Cancer Father        multiple myeloma  . Diabetes Father   . Edema Sister   . Diabetes Sister   . Bipolar disorder Sister     Social History Social History   Tobacco Use  . Smoking  status: Never Smoker  . Smokeless tobacco: Never Used  Vaping Use  . Vaping Use: Never used  Substance Use Topics  . Alcohol use: No  . Drug use: No    Review of Systems  Constitutional: No fever/chills Eyes: No visual changes. ENT: No sore throat. Cardiovascular: Positive chest pain and diaphoresis.  Respiratory: Denies shortness of breath. Gastrointestinal: No abdominal pain. Positive nausea and vomiting.  No diarrhea.  No constipation. Genitourinary: Negative for  dysuria. Musculoskeletal: Negative for back pain. Skin: Negative for rash. Neurological: Negative for weakness. Positive HA and numbness in the left arm.   10-point ROS otherwise negative.  ____________________________________________   PHYSICAL EXAM:  VITAL SIGNS: ED Triage Vitals  Enc Vitals Group     BP 03/22/20 1712 139/84     Pulse Rate 03/22/20 1712 73     Resp 03/22/20 1712 18     Temp 03/22/20 1712 98.5 F (36.9 C)     Temp Source 03/22/20 1712 Oral     SpO2 03/22/20 1712 100 %     Weight 03/22/20 1709 (!) 329 lb (149.2 kg)     Height 03/22/20 1709 '6\' 2"'  (1.88 m)   Constitutional: Alert and oriented. Well appearing and in no acute distress. Eyes: Conjunctivae are normal.  Head: Atraumatic. Nose: No congestion/rhinnorhea. Mouth/Throat: Mucous membranes are moist.  Neck: No stridor.   Cardiovascular: Normal rate, regular rhythm. Good peripheral circulation. Grossly normal heart sounds.   Respiratory: Normal respiratory effort.  No retractions. Lungs CTAB. Gastrointestinal: Soft and nontender. No distention.  Musculoskeletal: No lower extremity tenderness nor edema. No gross deformities of extremities. Neurologic:  Normal speech and language.  Patient with slight decrease sensation to light touch over the left face.  No facial asymmetry.  Normal sensation in the left upper extremity compared to the right.  No sensory deficits or weakness in the upper or lower extremities.  Normal speech.  No visual field deficits.  Skin:  Skin is warm, dry and intact. No rash noted.  ____________________________________________   LABS (all labs ordered are listed, but only abnormal results are displayed)  Labs Reviewed  BASIC METABOLIC PANEL - Abnormal; Notable for the following components:      Result Value   Creatinine, Ser 1.64 (*)    GFR calc non Af Amer 50 (*)    GFR calc Af Amer 58 (*)    All other components within normal limits  SARS CORONAVIRUS 2 BY RT PCR (HOSPITAL  ORDER, [place] [facility])  CBC  HEPATIC FUNCTION PANEL  LIPASE, BLOOD  TROPONIN I (HIGH SENSITIVITY)  TROPONIN I (HIGH SENSITIVITY)   ____________________________________________  EKG   EKG Interpretation  Date/Time:  Sunday March 22 2020 17:04:48 EDT Ventricular Rate:  74 PR Interval:  154 QRS Duration: 100 QT Interval:  380 QTC Calculation: 421 R Axis:   30 Text Interpretation: Normal sinus rhythm Normal ECG No STEMI Confirmed by [name] [phone]) on 03/22/2020 5:25:20 PM       ____________________________________________  RADIOLOGY  DG Chest 2 View  Result Date: 03/22/2020 CLINICAL DATA:  Chest pain. Diaphoretic. Left arm discomfort today. EXAM: CHEST - 2 VIEW COMPARISON:  Radiograph 01/16/2020 FINDINGS: The cardiomediastinal contours are normal. The lungs are clear. Pulmonary vasculature is normal. No consolidation, pleural effusion, or pneumothorax. No acute osseous abnormalities are seen. IMPRESSION: Negative radiographs of the chest. Electronically Signed   By: Keith Rake M.D.   On: 03/22/2020 17:46   CT Head Wo Contrast  Result  Date: 03/22/2020 CLINICAL DATA:  Left arm numbness EXAM: CT HEAD WITHOUT CONTRAST TECHNIQUE: Contiguous axial images were obtained from the base of the skull through the vertex without intravenous contrast. COMPARISON:  None. FINDINGS: Brain: No acute intracranial abnormality. Specifically, no hemorrhage, hydrocephalus, mass lesion, acute infarction, or significant intracranial injury. Vascular: No hyperdense vessel or unexpected calcification. Skull: No acute calvarial abnormality. Sinuses/Orbits: Visualized paranasal sinuses and mastoids clear. Orbital soft tissues unremarkable. Other: None IMPRESSION: No intracranial abnormality. Electronically Signed   By: Rolm Baptise M.D.   On: 03/22/2020 19:27    ____________________________________________   PROCEDURES  Procedure(s) performed:   Procedures  None    ____________________________________________   INITIAL IMPRESSION / ASSESSMENT AND PLAN / ED COURSE  Pertinent labs & imaging results that were available during my care of the patient were reviewed by me and considered in my medical decision making (see chart for details).   Patient presents to the emergency department with acute onset chest discomfort with diaphoresis.  He did develop a numb feeling in his left arm which is resolved.  He also had some mild headache.  On my exam he does have some decreased sensation to light touch in the left face as well.  No sharp, ripping/stabbing pain in the chest to suspect dissection.  Plan for ACS work-up. HEART score 3. Last normal at 10 AM so not in [place] window. Dissection is very low on my differential base on history and exam. No plan for chest/abdomen imaging at this time.   08:15 PM Spoke with Dr. Maryan Rued at [facility] accepts the patient in ED to ED transfer.  Patient on reassess is having some full, soreness in the left abdomen.  Possibly referred up into the chest explaining his discomfort.  No ripping, tearing type pain to suspect dissection.  He is minimally tender on exam.  I have added LFTs and lipase.  Plan for repeat troponin now as well.  If those are stable patient will go by private vehicle to [facility] emergency department for MRI to rule out stroke.  Suspicion for complicated migraine clinically. CP has resolved but not with nitro.   Repeat troponin negative. LFTs and Lipase normal. Patient stable in my view for POV transfer to [facility] for MRI. Discussed to go there directly, obey traffic laws, and remain NPO. Wife is here at bedside and will drive. MRI order placed.  ____________________________________________  FINAL CLINICAL IMPRESSION(S) / ED DIAGNOSES  Final diagnoses:  Numbness  Precordial chest pain  Acute nonintractable headache, unspecified headache type     MEDICATIONS GIVEN DURING THIS VISIT:  Medications   nitroGLYCERIN (NITROSTAT) SL tablet 0.4 mg (0.4 mg Sublingual Given 03/22/20 1949)  ondansetron (ZOFRAN) injection 4 mg (4 mg Intravenous Not Given 03/22/20 2212)  aspirin chewable tablet 324 mg (324 mg Oral Given 03/22/20 1950)     Note:  This document was prepared using Dragon voice recognition software and may include unintentional dictation errors.  [name], MD, [facility]    Jeffery Bachmeier, [facility], MD 03/22/20 2308

## 2020-03-23 ENCOUNTER — Emergency Department (HOSPITAL_COMMUNITY): Payer: Managed Care, Other (non HMO)

## 2020-03-23 NOTE — ED Notes (Signed)
Patient verbalizes understanding of discharge instructions. Opportunity for questioning and answers were provided. Armband removed by staff, pt discharged from ED ambulatory to home.  

## 2020-03-23 NOTE — ED Provider Notes (Signed)
I assumed care of this patient.  Please see previous provider note for further details of Hx, PE.  Briefly patient is a 44 y.o. male who presented left arm tingling. Ruled out for ACS. Sent here for MRI to r/o CVA.  MRI negative.  Patient reports a history of migraine headaches and reports having a headache during that time.  Headache and tingling are now resolved.  Patient updated on findings.  The patient appears reasonably screened and/or stabilized for discharge and I doubt any other medical condition or other [facility] requiring further screening, evaluation, or treatment in the ED at this time prior to discharge. Safe for discharge with strict return precautions.  Disposition: Discharge  Condition: Good  I have discussed the results, Dx and Tx plan with the patient/family who expressed understanding and agree(s) with the plan. Discharge instructions discussed at length. The patient/family was given strict return precautions who verbalized understanding of the instructions. No further questions at time of discharge.    ED Discharge Orders         Ordered    Ambulatory referral to Cardiology     Discontinue  Reprint    Comments: Chest pain   03/22/20 2153            Follow Up: [name], MD [place]  [id number] [phone]  Call in 1 day to schedule an ASAP follow up appointment  [place] [address] [phone] Call in 2 days to confirm your follow up appointment  [place] [address] [phone] m[place] [phone]  If symptoms worsen         Fatima Blank, MD 03/23/20 [phone]

## 2020-03-27 ENCOUNTER — Encounter: Payer: Self-pay | Admitting: Cardiology

## 2020-03-27 ENCOUNTER — Ambulatory Visit (INDEPENDENT_AMBULATORY_CARE_PROVIDER_SITE_OTHER): Payer: Managed Care, Other (non HMO) | Admitting: Cardiology

## 2020-03-27 ENCOUNTER — Ambulatory Visit: Payer: Managed Care, Other (non HMO) | Admitting: Cardiology

## 2020-03-27 ENCOUNTER — Other Ambulatory Visit: Payer: Self-pay

## 2020-03-27 VITALS — BP 124/78 | HR 74 | Ht 74.0 in | Wt 332.0 lb

## 2020-03-27 DIAGNOSIS — I472 Ventricular tachycardia: Secondary | ICD-10-CM

## 2020-03-27 DIAGNOSIS — R0602 Shortness of breath: Secondary | ICD-10-CM | POA: Diagnosis not present

## 2020-03-27 DIAGNOSIS — F418 Other specified anxiety disorders: Secondary | ICD-10-CM

## 2020-03-27 DIAGNOSIS — Z8719 Personal history of other diseases of the digestive system: Secondary | ICD-10-CM

## 2020-03-27 DIAGNOSIS — R0789 Other chest pain: Secondary | ICD-10-CM | POA: Diagnosis not present

## 2020-03-27 DIAGNOSIS — I4729 Other ventricular tachycardia: Secondary | ICD-10-CM

## 2020-03-27 NOTE — Progress Notes (Signed)
Cardiology Consultation:    Date:  03/27/2020   ID:  Francis Lin, DOB [birth year], MRN [id number]  PCP:  [name], MD  Cardiologist:  Jenne Campus, MD   Referring MD: Margette Fast, MD   No chief complaint on file. I had some chest pain and left arm numbness last week  History of Present Illness:    Francis Lin is a 44 y.o. male who is being seen today for the evaluation of chest pain and arm numbness at the request of Long, Wonda Olds, MD.  His past medical history significant for some wide-complex tachycardia and that being investigated in 2019.  Work-up has been negative.  He did have a stress test which was negative echocardiogram showed preserved left ventricle ejection fraction.  He was also seen by Dr. Rayann Heman for EP evaluation and feeling of the time was since continue conservative approach.  Apparently about a week ago he did not feel all day.  Since February he has been working night and that disrupt his sleep awakeness schedule.  That day from the morning he did not feel well he went to the church became a little nauseated somewhat sweaty.  Eventually end up going to some a party with his family when he was there he did not feel well end up going to the restroom he had multiple bowel movements as well as some vomiting and he was belching a lot.  After that he started experiencing some numbness of the left arm as well as some uneasy sensation in the chest.  Eventually ended going to the emergency room emergency room evaluation showed no evidence of myocardial infarction his troponins high-sensitivity was negative.  Since that time he said he is doing well.  Described to have some headache on and off.  A lot of symptoms a lot of complaints majority of this is fairly nonspecific.  He does not exercise on a regular basis.  He works as a Freight forwarder.  He works from 6:00 PM until 4:30 in the morning.  Then he tried to sleep during the day.  On top of that he is  taking care of his wife who is presently on chemotherapy. Does not smoke, he is lipid profile is compliant quite decent without medication no family history of premature coronary artery disease. 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  He is being on anticoagulation for few months and then anticoagulation has been discontinued.  Still struggling with some pain in his lower extremity.  Past Medical History:  Diagnosis Date  . Acid reflux   . DVT (deep venous thrombosis) ([place])   . Esophagitis   . Hypersomnia, persistent   . IBS (irritable bowel syndrome)   . Shifting sleep-work schedule, affecting sleep 06/20/2013  . Snoring disorder 06/20/2013    Past Surgical History:  Procedure Laterality Date  . ORIF TIBIA & FIBULA FRACTURES      Current Medications: Current Meds  Medication Sig  . acetaminophen (TYLENOL) 325 MG tablet Take 650 mg by mouth every 6 (six) hours as needed for mild pain.  Marland Kitchen aspirin EC 81 MG tablet Take 81 mg by mouth every 6 (six) hours as needed for mild pain.   . cetirizine (ZYRTEC) 10 MG tablet Take 1 tablet (10 mg total) by mouth daily. (Patient taking differently: Take 10 mg by mouth daily as needed for allergies. )  . colchicine 0.6 MG tablet Take by mouth.  Marland Kitchen  fluticasone (FLONASE) 50 MCG/ACT nasal spray Place 1 spray into both nostrils daily as needed for allergies or rhinitis.  Marland Kitchen ibuprofen (ADVIL) 200 MG tablet Take 200 mg by mouth every 6 (six) hours as needed for fever or mild pain.  . meloxicam (MOBIC) 15 MG tablet Take 15 mg by mouth daily as needed for pain.      Allergies:   Lactose, Caffeine, Omeprazole, Peanuts [peanut oil], Penicillins, Seasonal ic [cholestatin], and Lactose intolerance (gi)   Social History   Socioeconomic History  . Marital status: Married    Spouse name: Not on file  . Number of children: 2  . Years of education: college  . Highest education level: Not on file  Occupational History     Employer: [employer]    Comment: [employer] center  Tobacco Use  . Smoking status: Never Smoker  . Smokeless tobacco: Never Used  Vaping Use  . Vaping Use: Never used  Substance and Sexual Activity  . Alcohol use: No  . Drug use: No  . Sexual activity: Not on file  Other Topics Concern  . Not on file  Social History Narrative  . Not on file   Social Determinants of Health   Financial Resource Strain:   . Difficulty of Paying Living Expenses:   Food Insecurity:   . Worried About Charity fundraiser in the Last Year:   . Arboriculturist in the Last Year:   Transportation Needs:   . Film/video editor (Medical):   Marland Kitchen Lack of Transportation (Non-Medical):   Physical Activity:   . Days of Exercise per Week:   . Minutes of Exercise per Session:   Stress:   . Feeling of Stress :   Social Connections:   . Frequency of Communication with Friends and Family:   . Frequency of Social Gatherings with Friends and Family:   . Attends Religious Services:   . Active Member of Clubs or Organizations:   . Attends Archivist Meetings:   Marland Kitchen Marital Status:      Family History: The patient's family history includes Arthritis in his father; Bipolar disorder in his sister; Cancer in his father; Diabetes in his father and sister; Edema in his sister; Heart attack in his father; Hypertension in his father; Lupus in his mother. ROS:   Please see the history of present illness.    All 14 point review of systems negative except as described per history of present illness.  EKGs/Labs/Other Studies Reviewed:    The following studies were reviewed today: EKG from 14th of this month showed normal sinus rhythm normal P interval nonspecific ST segment changes    Recent Labs: 02/17/2020: TSH 2.21 03/22/2020: ALT 22; BUN 14; Creatinine, Ser 1.64; Hemoglobin 15.1; Platelets 249; Potassium 3.9; Sodium 138  Recent Lipid Panel    Component Value Date/Time   CHOL 128 02/17/2020 1136    CHOL 132 04/13/2018 0000   TRIG 76.0 02/17/2020 1136   HDL 33.30 (L) 02/17/2020 1136   HDL 35 (L) 04/13/2018 0000   CHOLHDL 4 02/17/2020 1136   VLDL 15.2 02/17/2020 1136   LDLCALC 80 02/17/2020 1136   LDLCALC 85 04/13/2018 0000    Physical Exam:    VS:  BP 124/78   Pulse 74   Ht 6\' 2"  (1.88 m)   Wt (!) 332 lb (150.6 kg)   SpO2 98%   BMI 42.63 kg/m     Wt Readings from Last 3 Encounters:  03/27/20 Marland Kitchen)  332 lb (150.6 kg)  03/22/20 (!) 329 lb (149.2 kg)  03/06/20 (!) 332 lb (150.6 kg)     GEN:  Well nourished, well developed in no acute distress HEENT: Normal NECK: No JVD; No carotid bruits LYMPHATICS: No lymphadenopathy CARDIAC: RRR, no murmurs, no rubs, no gallops RESPIRATORY:  Clear to auscultation without rales, wheezing or rhonchi  ABDOMEN: Soft, non-tender, non-distended MUSCULOSKELETAL:  No edema; No deformity  SKIN: Warm and dry NEUROLOGIC:  Alert and oriented x 3 PSYCHIATRIC:  Normal affect   ASSESSMENT:    1. Atypical chest pain   2. History of esophageal stricture   3. Shortness of breath   4. Nonsustained ventricular tachycardia ([place])   5. Situational anxiety    PLAN:    In order of problems listed above:  1. Atypical chest pain.  I did review the stress test from 2019 which was negative.  He does not have that many risk factors for coronary artery disease.  However with his symptomatology I still think it would be appropriate to pursue stress testing we will schedule him to have [place]. 2. History of esophageal stricture.  If stress test will be negative he may need to see GI specialist. 3. Dyspnea on exertion.  Last echocardiogram showed preserved left ventricle ejection fraction will wait for results of stress testing. 4. History of nonsustained ventricular tachycardia denies having any dizziness or passing out. 5. Anxiety.  Noted. 6. Cholesterol profile reviewed looks decent.  We will continue risk factors modifications.   Medication  Adjustments/Labs and Tests Ordered: Current medicines are reviewed at length with the patient today.  Concerns regarding medicines are outlined above.  Orders Placed This Encounter  Procedures  . MYOCARDIAL PERFUSION IMAGING   No orders of the defined types were placed in this encounter.   Signed, Park Liter, MD, [facility]. 03/27/2020 12:23 PM    [facility]

## 2020-03-27 NOTE — Patient Instructions (Signed)
Medication Instructions:  Your physician recommends that you continue on your current medications as directed. Please refer to the Current Medication list given to you today.  *If you need a refill on your cardiac medications before your next appointment, please call your pharmacy*   Lab Work: None.  If you have labs (blood work) drawn today and your tests are completely normal, you will receive your results only by: Marland Kitchen MyChart Message (if you have MyChart) OR . A paper copy in the mail If you have any lab test that is abnormal or we need to change your treatment, we will call you to review the results.   Testing/Procedures:   [facility] Cardiovascular Imaging at [facility] [address] [place], [place] [address] Phone: [phone]    Please arrive 15 minutes prior to your appointment time for registration and insurance purposes.  The test will take approximately 3 to 4 hours to complete; you may bring reading material.  If someone comes with you to your appointment, they will need to remain in the main lobby due to limited space in the testing area. **If you are pregnant or breastfeeding, please notify the nuclear lab prior to your appointment**  How to prepare for your Myocardial Perfusion Test: . Do not eat or drink 3 hours prior to your test, except you may have water. . Do not consume products containing caffeine (regular or decaffeinated) 12 hours prior to your test. (ex: coffee, chocolate, sodas, tea). . Do bring a list of your current medications with you.  If not listed below, you may take your medications as normal. .  . Do wear comfortable clothes (no dresses or overalls) and walking shoes, tennis shoes preferred (No heels or open toe shoes are allowed). . Do NOT wear cologne, perfume, aftershave, or lotions (deodorant is allowed). . If these instructions are not followed, your test will have to be rescheduled.  Please report to [address], [address] for your test.  If you have questions or concerns about your appointment, you can call the Nuclear Lab at [phone].  If you cannot keep your appointment, please provide 24 hours notification to the Nuclear Lab, to avoid a possible $50 charge to your account.    Follow-Up: At [facility], you and your health needs are our priority.  As part of our continuing mission to provide you with exceptional heart care, we have created designated Provider Care Teams.  These Care Teams include your primary Cardiologist (physician) and Advanced Practice Providers (APPs -  Physician Assistants and Nurse Practitioners) who all work together to provide you with the care you need, when you need it.  We recommend signing up for the patient portal called "MyChart".  Sign up information is provided on this After Visit Summary.  MyChart is used to connect with patients for Virtual Visits (Telemedicine).  Patients are able to view lab/test results, encounter notes, upcoming appointments, etc.  Non-urgent messages can be sent to your provider as well.   To learn more about what you can do with MyChart, go to [email or web address].    Your next appointment:   6 week(s)  The format for your next appointment:   In Person  Provider:   Jenne Campus, MD   Other Instructions   Cardiac Nuclear Scan A cardiac nuclear scan is a test that measures blood flow to the heart when a person is resting and when he or she is exercising. The test looks for problems  Not enough blood reaching a portion of the heart.  The heart muscle not working normally. You may need this test if:  You have heart disease.  You have had abnormal lab results.  You have had heart surgery or a balloon procedure to open up blocked arteries (angioplasty).  You have chest pain.  You have shortness of breath. In this test, a radioactive dye (tracer) is injected into your bloodstream. After the tracer has traveled to  your heart, an imaging device is used to measure how much of the tracer is absorbed by or distributed to various areas of your heart. This procedure is usually done at a hospital and takes 2-4 hours. Tell a health care provider about:  Any allergies you have.  All medicines you are taking, including vitamins, herbs, eye drops, creams, and over-the-counter medicines.  Any problems you or family members have had with anesthetic medicines.  Any blood disorders you have.  Any surgeries you have had.  Any medical conditions you have.  Whether you are pregnant or may be pregnant. What are the risks? Generally, this is a safe procedure. However, problems may occur, including:  Serious chest pain and heart attack. This is only a risk if the stress portion of the test is done.  Rapid heartbeat.  Sensation of warmth in your chest. This usually passes quickly.  Allergic reaction to the tracer. What happens before the procedure?  Ask your health care provider about changing or stopping your regular medicines. This is especially important if you are taking diabetes medicines or blood thinners.  Follow instructions from your health care provider about eating or drinking restrictions.  Remove your jewelry on the day of the procedure. What happens during the procedure?  An IV will be inserted into one of your veins.  Your health care provider will inject a small amount of radioactive tracer through the IV.  You will wait for 20-40 minutes while the tracer travels through your bloodstream.  Your heart activity will be monitored with an electrocardiogram (ECG).  You will lie down on an exam table.  Images of your heart will be taken for about 15-20 minutes.  You may also have a stress test. For this test, one of the following may be done: ? You will exercise on a treadmill or stationary bike. While you exercise, your heart's activity will be monitored with an ECG, and your blood  pressure will be checked. ? You will be given medicines that will increase blood flow to parts of your heart. This is done if you are unable to exercise.  When blood flow to your heart has peaked, a tracer will again be injected through the IV.  After 20-40 minutes, you will get back on the exam table and have more images taken of your heart.  Depending on the type of tracer used, scans may need to be repeated 3-4 hours later.  Your IV line will be removed when the procedure is over. The procedure may vary among health care providers and hospitals. What happens after the procedure?  Unless your health care provider tells you otherwise, you may return to your normal schedule, including diet, activities, and medicines.  Unless your health care provider tells you otherwise, you may increase your fluid intake. This will help to flush the contrast dye from your body. Drink enough fluid to keep your urine pale yellow.  Ask your health care provider, or the department that is doing the test: ? When will my results   be ready? ? How will I get my results? Summary  A cardiac nuclear scan measures the blood flow to the heart when a person is resting and when he or she is exercising.  Tell your health care provider if you are pregnant.  Before the procedure, ask your health care provider about changing or stopping your regular medicines. This is especially important if you are taking diabetes medicines or blood thinners.  After the procedure, unless your health care provider tells you otherwise, increase your fluid intake. This will help flush the contrast dye from your body.  After the procedure, unless your health care provider tells you otherwise, you may return to your normal schedule, including diet, activities, and medicines. This information is not intended to replace advice given to you by your health care provider. Make sure you discuss any questions you have with your health care  provider. Document Revised: 03/12/2018 Document Reviewed: 03/12/2018 Elsevier Patient Education  2020 Elsevier Inc.   

## 2020-04-02 ENCOUNTER — Encounter (HOSPITAL_COMMUNITY): Payer: Self-pay | Admitting: Cardiology

## 2020-04-08 ENCOUNTER — Ambulatory Visit: Payer: Managed Care, Other (non HMO) | Admitting: Cardiology

## 2020-04-22 ENCOUNTER — Encounter (HOSPITAL_COMMUNITY): Payer: Self-pay | Admitting: *Deleted

## 2020-04-22 ENCOUNTER — Telehealth (HOSPITAL_COMMUNITY): Payer: Self-pay | Admitting: *Deleted

## 2020-04-22 NOTE — Telephone Encounter (Signed)
Left message on voicemail per DPR in reference to upcoming appointment scheduled on 04/29/2020 at 1315 with detailed instructions given per Myocardial Perfusion Study Information Sheet for the test. LM to arrive 15 minutes early, and that it is imperative to arrive on time for appointment to keep from having the test rescheduled. If you need to cancel or reschedule your appointment, please call the office within 24 hours of your appointment. Failure to do so may result in a cancellation of your appointment, and a $50 no show fee. Phone number given for call back for any questions. Mychart letter sent with instructions.Fantasha Daniele, Ranae Palms

## 2020-04-29 ENCOUNTER — Ambulatory Visit (HOSPITAL_COMMUNITY): Payer: Managed Care, Other (non HMO) | Attending: Cardiology

## 2020-04-29 ENCOUNTER — Other Ambulatory Visit: Payer: Self-pay

## 2020-04-29 DIAGNOSIS — R0789 Other chest pain: Secondary | ICD-10-CM | POA: Insufficient documentation

## 2020-04-29 MED ORDER — REGADENOSON 0.4 MG/5ML IV SOLN
0.4000 mg | Freq: Once | INTRAVENOUS | Status: AC
  Administered 2020-04-29: 0.4 mg via INTRAVENOUS

## 2020-04-29 MED ORDER — TECHNETIUM TC 99M TETROFOSMIN IV KIT
32.6000 | PACK | Freq: Once | INTRAVENOUS | Status: AC | PRN
  Administered 2020-04-29: 32.6 via INTRAVENOUS
  Filled 2020-04-29: qty 33

## 2020-04-30 ENCOUNTER — Ambulatory Visit (HOSPITAL_COMMUNITY): Payer: Managed Care, Other (non HMO) | Attending: Cardiovascular Disease

## 2020-04-30 LAB — MYOCARDIAL PERFUSION IMAGING
LV dias vol: 87 mL (ref 62–150)
LV sys vol: 36 mL
Peak HR: 117 {beats}/min
Rest HR: 73 {beats}/min
SDS: 2
SRS: 0
SSS: 2
TID: 1.27

## 2020-04-30 MED ORDER — TECHNETIUM TC 99M TETROFOSMIN IV KIT
30.6000 | PACK | Freq: Once | INTRAVENOUS | Status: AC | PRN
  Administered 2020-04-30: 30.6 via INTRAVENOUS
  Filled 2020-04-30: qty 31

## 2020-05-06 ENCOUNTER — Telehealth: Payer: Self-pay | Admitting: Cardiology

## 2020-05-06 NOTE — Telephone Encounter (Signed)
Maury is returning Hayley's call.  

## 2020-05-06 NOTE — Telephone Encounter (Signed)
Called patient and informed him of results.

## 2020-05-06 NOTE — Telephone Encounter (Signed)
Francis Lin is returning Hayley's call in regards to Stress test results. Please advise.

## 2020-05-06 NOTE — Telephone Encounter (Signed)
Left message for patient to return call.

## 2020-10-16 ENCOUNTER — Encounter: Payer: Self-pay | Admitting: Gastroenterology

## 2020-10-24 ENCOUNTER — Other Ambulatory Visit: Payer: Managed Care, Other (non HMO)

## 2020-11-03 ENCOUNTER — Other Ambulatory Visit (INDEPENDENT_AMBULATORY_CARE_PROVIDER_SITE_OTHER): Payer: Managed Care, Other (non HMO)

## 2020-11-03 ENCOUNTER — Encounter: Payer: Self-pay | Admitting: Gastroenterology

## 2020-11-03 ENCOUNTER — Other Ambulatory Visit: Payer: Self-pay

## 2020-11-03 ENCOUNTER — Ambulatory Visit (INDEPENDENT_AMBULATORY_CARE_PROVIDER_SITE_OTHER): Payer: Managed Care, Other (non HMO) | Admitting: Gastroenterology

## 2020-11-03 VITALS — BP 110/80 | HR 70 | Ht 74.0 in | Wt 334.0 lb

## 2020-11-03 DIAGNOSIS — R110 Nausea: Secondary | ICD-10-CM | POA: Insufficient documentation

## 2020-11-03 DIAGNOSIS — R1084 Generalized abdominal pain: Secondary | ICD-10-CM | POA: Diagnosis not present

## 2020-11-03 DIAGNOSIS — R194 Change in bowel habit: Secondary | ICD-10-CM

## 2020-11-03 DIAGNOSIS — R1012 Left upper quadrant pain: Secondary | ICD-10-CM

## 2020-11-03 LAB — CBC WITH DIFFERENTIAL/PLATELET
Basophils Absolute: 0.1 10*3/uL (ref 0.0–0.1)
Basophils Relative: 1 % (ref 0.0–3.0)
Eosinophils Absolute: 0.8 10*3/uL — ABNORMAL HIGH (ref 0.0–0.7)
Eosinophils Relative: 11.7 % — ABNORMAL HIGH (ref 0.0–5.0)
HCT: 44.8 % (ref 39.0–52.0)
Hemoglobin: 15.1 g/dL (ref 13.0–17.0)
Lymphocytes Relative: 23.5 % (ref 12.0–46.0)
Lymphs Abs: 1.7 10*3/uL (ref 0.7–4.0)
MCHC: 33.6 g/dL (ref 30.0–36.0)
MCV: 87.7 fl (ref 78.0–100.0)
Monocytes Absolute: 0.8 10*3/uL (ref 0.1–1.0)
Monocytes Relative: 10.8 % (ref 3.0–12.0)
Neutro Abs: 3.7 10*3/uL (ref 1.4–7.7)
Neutrophils Relative %: 53 % (ref 43.0–77.0)
Platelets: 251 10*3/uL (ref 150.0–400.0)
RBC: 5.11 Mil/uL (ref 4.22–5.81)
RDW: 14.5 % (ref 11.5–15.5)
WBC: 7.1 10*3/uL (ref 4.0–10.5)

## 2020-11-03 LAB — COMPREHENSIVE METABOLIC PANEL
ALT: 16 U/L (ref 0–53)
AST: 15 U/L (ref 0–37)
Albumin: 4 g/dL (ref 3.5–5.2)
Alkaline Phosphatase: 71 U/L (ref 39–117)
BUN: 13 mg/dL (ref 6–23)
CO2: 28 mEq/L (ref 19–32)
Calcium: 9.1 mg/dL (ref 8.4–10.5)
Chloride: 104 mEq/L (ref 96–112)
Creatinine, Ser: 1.38 mg/dL (ref 0.40–1.50)
GFR: 62.26 mL/min (ref >60.00)
Glucose, Bld: 84 mg/dL (ref 70–99)
Potassium: 4.3 mEq/L (ref 3.5–5.1)
Sodium: 137 mEq/L (ref 135–145)
Total Bilirubin: 0.5 mg/dL (ref 0.2–1.2)
Total Protein: 7.2 g/dL (ref 6.0–8.3)

## 2020-11-03 NOTE — Patient Instructions (Signed)
If you are age 45 or older, your body mass index should be between 23-30. Your Body mass index is 42.88 kg/m. If this is out of the aforementioned range listed, please consider follow up with your Primary Care Provider.  If you are age 52 or younger, your body mass index should be between 19-25. Your Body mass index is 42.88 kg/m. If this is out of the aformentioned range listed, please consider follow up with your Primary Care Provider.   Your provider has requested that you go to the basement level for lab work before leaving today. Press "B" on the elevator. The lab is located at the first door on the left as you exit the elevator. You have been scheduled for a CT scan of the abdomen and pelvis at [facility], 1st floor Radiology. You are scheduled on 11/11/20  at [facility] should arrive 15 minutes prior to your appointment time for registration.  Please pick up 2 bottles of contrast from [facility] at least 3 days prior to your scan. The solution may taste better if refrigerated, but do NOT add ice or any other liquid to this solution. Shake well before drinking.   Please follow the written instructions below on the day of your exam:   1) Do not eat anything after 5am (4 hours prior to your test)   2) Drink 1 bottle of contrast @ 7am (2 hours prior to your exam)  Remember to shake well before drinking and do NOT pour over ice.     Drink 1 bottle of contrast @ 8am (1 hour prior to your exam)   You may take any medications as prescribed with a small amount of water, if necessary. If you take any of the following medications: METFORMIN, GLUCOPHAGE, GLUCOVANCE, AVANDAMET, RIOMET, FORTAMET, [place] MET, JANUMET, GLUMETZA or METAGLIP, you MAY be asked to HOLD this medication 48 hours AFTER the exam.   The purpose of you drinking the oral contrast is to aid in the visualization of your intestinal tract. The contrast solution may cause some diarrhea. Depending on your individual set of  symptoms, you may also receive an intravenous injection of x-ray contrast/dye. Plan on being at [facility] for 45 minutes or longer, depending on the type of exam you are having performed.   If you have any questions regarding your exam or if you need to reschedule, you may call [facility] at [phone] between the hours of 8:00 am and 5:00 pm, Monday-Friday.   Start Benefiber or Citrucel 2 teaspoons in 8 ounces of liquid daily and may increase to twice daily if tolerated.   Thank you for choosing me and [facility].  Alonza Bogus , PA-C

## 2020-11-03 NOTE — Progress Notes (Addendum)
11/03/2020 Francis Lin [id number] 1976-04-17   HISTORY OF PRESENT ILLNESS: This is a 45 year old male who was seen here remotely by Dr. Deatra Ina.  He had an EGD here in March 2010 at which time he had had esophagitis in the distal esophagus and a stricture that was dilated.  He tells me that he had a colonoscopy through [facility] with Dr. Ferdinand Lango in approximately 2016 at which time his colon looked normal but he had hemorrhoids that were banded.  He is here today with several different complaints.  He says that when he eats or even sometimes when he does not eat he has pain in the left upper quadrant of his abdomen that seems to radiate around to his left back and shoulder.  Has some other generalized vague abdominal pain as well.  He always feels bloated.  He says that the left upper quadrant abdominal pain sometimes relieves after he has a bowel movement.  His bowel habits have been inconsistent as well.  Seems to have bowel movements frequently, but not necessarily diarrhea per se.  Reports intermittent episodes of nausea with occasional vomiting that sound like they kind of come out of nowhere.  He denies any overt heartburn or reflux sensation.  He says that he knows he had the symptoms previously, but has not felt those as of late.  He takes only occasional NSAIDs.    Past Medical History:  Diagnosis Date  . Acid reflux   . DVT (deep venous thrombosis) ([place])   . Esophagitis   . Hypersomnia, persistent   . IBS (irritable bowel syndrome)   . Shifting sleep-work schedule, affecting sleep 06/20/2013  . Snoring disorder 06/20/2013   Past Surgical History:  Procedure Laterality Date  . ORIF TIBIA & FIBULA FRACTURES      reports that he has never smoked. He has never used smokeless tobacco. He reports that he does not drink alcohol and does not use drugs. family history includes Arthritis in his father; Bipolar disorder in his sister; Cancer in his father; Diabetes in his  father and sister; Edema in his sister; Heart attack in his father; Hypertension in his father; Lupus in his mother. Allergies  Allergen Reactions  . Lactose Diarrhea and Other (See Comments)    Lactose intolerance Lactose intolerance Lactose intolerance  . Caffeine Other (See Comments)    "makes feel bad", headaches  . Omeprazole Other (See Comments)    Mood swing  . Peanuts [Peanut Oil]   . Penicillins Other (See Comments)    Headaches   . Seasonal Ic [Cholestatin]     Sneezing,runny nose , itchy eyes  . Lactose Intolerance (Gi)       Outpatient Encounter Medications as of 11/03/2020  Medication Sig  . acetaminophen (TYLENOL) 325 MG tablet Take 650 mg by mouth every 6 (six) hours as needed for mild pain.  Marland Kitchen aspirin EC 81 MG tablet Take 81 mg by mouth every 6 (six) hours as needed for mild pain.   . cetirizine (ZYRTEC) 10 MG tablet Take 1 tablet (10 mg total) by mouth daily. (Patient taking differently: Take 10 mg by mouth daily as needed for allergies.)  . colchicine 0.6 MG tablet Take by mouth.  . fluticasone (FLONASE) 50 MCG/ACT nasal spray Place 1 spray into both nostrils daily as needed for allergies or rhinitis.  Marland Kitchen ibuprofen (ADVIL) 200 MG tablet Take 200 mg by mouth every 6 (six) hours as needed for fever or mild pain.  Marland Kitchen  meloxicam (MOBIC) 15 MG tablet Take 15 mg by mouth daily as needed for pain.   . [DISCONTINUED] cimetidine (TAGAMET) 200 MG tablet Take by mouth.   No facility-administered encounter medications on file as of 11/03/2020.     REVIEW OF SYSTEMS  : All other systems reviewed and negative except where noted in the History of Present Illness.   PHYSICAL EXAM: BP 110/80   Pulse 70   Ht 6\' 2"  (1.88 m)   Wt (!) 334 lb (151.5 kg)   BMI 42.88 kg/m  General: Well developed AA male in no acute distress Head: Normocephalic and atraumatic Eyes:  Sclerae anicteric, conjunctiva pink. Ears: Normal auditory acuity Lungs: Clear throughout to auscultation; no  W/R/R. Heart: Regular rate and rhythm; no M/R/G. Abdomen: Soft, non-distended.  BS present.  Mild diffuse TTP. Musculoskeletal: Symmetrical with no gross deformities  Skin: No lesions on visible extremities Extremities: No edema  Neurological: Alert oriented x 4, grossly non-focal Psychological:  Alert and cooperative. Normal mood and affect  ASSESSMENT AND PLAN: *45 year old male with several complaints including left upper quadrant abdominal pain that goes to the left side of his back as well as generalized discomfort.  Also reports irregular bowel habits and relief of the left upper quadrant abdominal pain with bowel movements at times.  Reported diffuse tenderness to palpation on exam.  Reports intermittent episodes of nausea and vomiting.  He has history of reflux as evidenced on EGD in 2010, but denies any active/overt heartburn/reflux symptoms recently.  I am not sure what is driving his symptoms.  He reports colonoscopy with Dr. Ferdinand Lango in 2016 so we will try to get those records.  Will check CBC and CMP today and start with CT scan of the abdomen and pelvis with contrast.  He will start Benefiber powder 2 teaspoons mixed in 8 ounces of liquid daily and increase to twice daily if needed/tolerated.  **Addendum: Colonoscopy report from March 2017 showed only internal hemorrhoids.  Prep was adequate and repeat colonoscopy was recommended at age 25.  Biopsies were performed throughout the entire colon and they were all completely normal with no significant histopathologic change.   CC:  [name], MD

## 2020-11-03 NOTE — Progress Notes (Signed)
Addendum: Reviewed and agree with assessment and management plan. Will need to follow-up records of outside endoscopic procedures as we formulate a plan and await CT findings. Chaise Mahabir, Lajuan Lines, MD

## 2020-11-06 ENCOUNTER — Other Ambulatory Visit: Payer: Self-pay

## 2020-11-06 ENCOUNTER — Encounter: Payer: Self-pay | Admitting: Family Medicine

## 2020-11-06 ENCOUNTER — Ambulatory Visit: Payer: Managed Care, Other (non HMO) | Admitting: Family Medicine

## 2020-11-06 ENCOUNTER — Ambulatory Visit (INDEPENDENT_AMBULATORY_CARE_PROVIDER_SITE_OTHER): Payer: Managed Care, Other (non HMO) | Admitting: Family Medicine

## 2020-11-06 VITALS — BP 124/84 | HR 86 | Ht 74.0 in | Wt 332.0 lb

## 2020-11-06 DIAGNOSIS — M25542 Pain in joints of left hand: Secondary | ICD-10-CM

## 2020-11-06 DIAGNOSIS — M109 Gout, unspecified: Secondary | ICD-10-CM

## 2020-11-06 LAB — C-REACTIVE PROTEIN: CRP: 1.5 mg/dL (ref 0.5–20.0)

## 2020-11-06 MED ORDER — PREDNISONE 20 MG PO TABS: ORAL_TABLET | ORAL | 0 refills | Status: DC

## 2020-11-06 NOTE — Progress Notes (Signed)
Established Patient Office Visit  Subjective:  Patient ID: Francis Lin, male    DOB: 25-Oct-1975  Age: 45 y.o. MRN: [id number]  CC:  Chief Complaint  Patient presents with  . Gout    HPI Francis Lin presents for acute left hand pain.  He has known history of gout.  Onset about a week ago.  He took high-dose over-the-counter ibuprofen and symptoms are somewhat better today.  He also had some leftover colchicine.  He states he has some pain in the left elbow and left shoulder.  He has had flareups of gout in his feet in the past.  Last uric acid on record was 9.3.  He does state that his mother has lupus and father has gout.  He denies any recent fever.  No recent injury.  Current flare mostly involves the dorsum of the left hand but also left wrist. Pain is moderate  Past Medical History:  Diagnosis Date  . Acid reflux   . DVT (deep venous thrombosis) ([place])   . Esophagitis   . Hypersomnia, persistent   . IBS (irritable bowel syndrome)   . Shifting sleep-work schedule, affecting sleep 06/20/2013  . Snoring disorder 06/20/2013    Past Surgical History:  Procedure Laterality Date  . ORIF TIBIA & FIBULA FRACTURES      Family History  Problem Relation Age of Onset  . Lupus Mother   . Heart attack Father   . Arthritis Father   . Hypertension Father   . Cancer Father        multiple myeloma  . Diabetes Father   . Edema Sister   . Diabetes Sister   . Bipolar disorder Sister     Social History   Socioeconomic History  . Marital status: Married    Spouse name: Not on file  . Number of children: 3  . Years of education: college  . Highest education level: Not on file  Occupational History    Employer: [employer]    Comment: dis[employer] center  Tobacco Use  . Smoking status: Never Smoker  . Smokeless tobacco: Never Used  Vaping Use  . Vaping Use: Never used  Substance and Sexual Activity  . Alcohol use: No  . Drug use: No  . Sexual activity: Not on file   Other Topics Concern  . Not on file  Social History Narrative  . Not on file   Social Determinants of Health   Financial Resource Strain: Not on file  Food Insecurity: Not on file  Transportation Needs: Not on file  Physical Activity: Not on file  Stress: Not on file  Social Connections: Not on file  Intimate Partner Violence: Not on file    Outpatient Medications Prior to Visit  Medication Sig Dispense Refill  . acetaminophen (TYLENOL) 325 MG tablet Take 650 mg by mouth every 6 (six) hours as needed for mild pain.    Marland Kitchen aspirin EC 81 MG tablet Take 81 mg by mouth every 6 (six) hours as needed for mild pain.     . cetirizine (ZYRTEC) 10 MG tablet Take 1 tablet (10 mg total) by mouth daily. (Patient taking differently: Take 10 mg by mouth daily as needed for allergies.) 30 tablet 11  . colchicine 0.6 MG tablet Take by mouth.    . fluticasone (FLONASE) 50 MCG/ACT nasal spray Place 1 spray into both nostrils daily as needed for allergies or rhinitis.    Marland Kitchen ibuprofen (ADVIL) 200 MG tablet Take 200 mg  by mouth every 6 (six) hours as needed for fever or mild pain.    . meloxicam (MOBIC) 15 MG tablet Take 15 mg by mouth daily as needed for pain.      No facility-administered medications prior to visit.    Allergies  Allergen Reactions  . Lactose Diarrhea and Other (See Comments)    Lactose intolerance Lactose intolerance Lactose intolerance  . Caffeine Other (See Comments)    "makes feel bad", headaches  . Omeprazole Other (See Comments)    Mood swing  . Peanuts [Peanut Oil]   . Penicillins Other (See Comments)    Headaches   . Seasonal Ic [Cholestatin]     Sneezing,runny nose , itchy eyes  . Lactose Intolerance (Gi)     ROS Review of Systems  Constitutional: Negative for chills and fever.  Respiratory: Negative for cough and shortness of breath.   Cardiovascular: Negative for chest pain.  Musculoskeletal: Positive for arthralgias.      Objective:    Physical  Exam Vitals reviewed.  Constitutional:      Appearance: Normal appearance.  Cardiovascular:     Rate and Rhythm: Normal rate and regular rhythm.  Pulmonary:     Effort: Pulmonary effort is normal.     Breath sounds: Normal breath sounds.  Musculoskeletal:     Comments: He has some obvious swelling dorsum left hand with mild erythema.  Minimal warmth.  Minimal tenderness.  Minimal left wrist tenderness and swelling.  Sparing of the right upper extremity.  Neurological:     Mental Status: He is alert.     BP 124/84   Pulse 86   Ht '6\' 2"'  (1.88 m)   Wt (!) 332 lb (150.6 kg)   SpO2 97%   BMI 42.63 kg/m  Wt Readings from Last 3 Encounters:  11/06/20 (!) 332 lb (150.6 kg)  11/03/20 (!) 334 lb (151.5 kg)  04/29/20 (!) 332 lb (150.6 kg)     Health Maintenance Due  Topic Date Due  . Hepatitis C Screening  Never done  . COVID-19 Vaccine (1) Never done  . HIV Screening  Never done  . INFLUENZA VACCINE  Never done    There are no preventive care reminders to display for this patient.  Lab Results  Component Value Date   TSH 2.21 02/17/2020   Lab Results  Component Value Date   WBC 7.1 11/03/2020   HGB 15.1 11/03/2020   HCT 44.8 11/03/2020   MCV 87.7 11/03/2020   PLT 251.0 11/03/2020   Lab Results  Component Value Date   NA 137 11/03/2020   K 4.3 11/03/2020   CO2 28 11/03/2020   GLUCOSE 84 11/03/2020   BUN 13 11/03/2020   CREATININE 1.38 11/03/2020   BILITOT 0.5 11/03/2020   ALKPHOS 71 11/03/2020   AST 15 11/03/2020   ALT 16 11/03/2020   PROT 7.2 11/03/2020   ALBUMIN 4.0 11/03/2020   CALCIUM 9.1 11/03/2020   ANIONGAP 10 03/22/2020   GFR 62.26 11/03/2020   Lab Results  Component Value Date   CHOL 128 02/17/2020   Lab Results  Component Value Date   HDL 33.30 (L) 02/17/2020   Lab Results  Component Value Date   LDLCALC 80 02/17/2020   Lab Results  Component Value Date   TRIG 76.0 02/17/2020   Lab Results  Component Value Date   CHOLHDL 4  02/17/2020   Lab Results  Component Value Date   HGBA1C 5.5 02/17/2020      Assessment &  Plan:   Problem List Items Addressed This Visit   None   Visit Diagnoses    Arthralgia of left hand    -  Primary   Relevant Orders   C-reactive Protein   Rheumatoid Factor   Cyclic citrul peptide antibody, IgG   ANA    Patient presents with acute pain left hand.  Past history of known gout.  Suspect acute gout flare.  Doubt rheumatoid or other connective tissue disorder.  -Check labs as above -Prednisone 20 mg take 2 tablets daily for 6 days -We did discuss issues regarding prevention.  Discussed low purine diet with handout given. -We discussed pros and cons of prophylaxis of medication such as allopurinol.  He usually gets about 1 flareup per year and is undecided regarding prophylaxis at this time. -Consider rheumatology referral -especially if positive rheumatoid factor or CCP antibody though suspect this is acute gout  Meds ordered this encounter  Medications  . predniSONE (DELTASONE) 20 MG tablet    Sig: Take two tablets by mouth once daily for 6 days    Dispense:  12 tablet    Refill:  0    Follow-up: No follow-ups on file.    Carolann Littler, MD

## 2020-11-06 NOTE — Patient Instructions (Signed)

## 2020-11-09 LAB — ANA: Anti Nuclear Antibody (ANA): NEGATIVE

## 2020-11-09 LAB — RHEUMATOID FACTOR: Rheumatoid fact SerPl-aCnc: <14 IU/mL (ref <14)

## 2020-11-09 LAB — CYCLIC CITRUL PEPTIDE ANTIBODY, IGG: Cyclic Citrullin Peptide Ab: <16 UNITS

## 2020-11-09 NOTE — Progress Notes (Signed)
Mychart message sent: Rheumatoid studies are negative.  Do suspect recent hand/wrist inflammation related to gout.  Be in touch if this is not resolving with the prednisone

## 2020-11-10 ENCOUNTER — Telehealth: Payer: Self-pay | Admitting: Gastroenterology

## 2020-11-10 NOTE — Telephone Encounter (Signed)
Patient is requesting to reschedule CT appointment

## 2020-11-10 NOTE — Telephone Encounter (Signed)
Spoke with patient and rescheduled him for Monday at 8:45 am at [place].

## 2020-11-11 ENCOUNTER — Ambulatory Visit (HOSPITAL_COMMUNITY): Payer: Managed Care, Other (non HMO)

## 2020-11-12 ENCOUNTER — Other Ambulatory Visit: Payer: Managed Care, Other (non HMO)

## 2020-11-16 ENCOUNTER — Ambulatory Visit (INDEPENDENT_AMBULATORY_CARE_PROVIDER_SITE_OTHER)
Admission: RE | Admit: 2020-11-16 | Discharge: 2020-11-16 | Disposition: A | Discharge status: Home / Self Care | Payer: Managed Care, Other (non HMO) | Source: Ambulatory Visit | Attending: Gastroenterology | Admitting: Gastroenterology

## 2020-11-16 ENCOUNTER — Telehealth: Payer: Self-pay | Admitting: Family Medicine

## 2020-11-16 ENCOUNTER — Other Ambulatory Visit: Payer: Self-pay

## 2020-11-16 DIAGNOSIS — R110 Nausea: Secondary | ICD-10-CM

## 2020-11-16 DIAGNOSIS — R1084 Generalized abdominal pain: Secondary | ICD-10-CM

## 2020-11-16 DIAGNOSIS — R1012 Left upper quadrant pain: Secondary | ICD-10-CM | POA: Diagnosis not present

## 2020-11-16 DIAGNOSIS — R194 Change in bowel habit: Secondary | ICD-10-CM

## 2020-11-16 MED ORDER — IOHEXOL 300 MG/ML  SOLN
125.0000 mL | Freq: Once | INTRAMUSCULAR | Status: AC | PRN
  Administered 2020-11-16: 125 mL via INTRAVENOUS

## 2020-11-16 NOTE — Telephone Encounter (Signed)
Left message that someone from the ordering providers office will contact him with results once they have reviewed them.

## 2020-11-16 NOTE — Telephone Encounter (Signed)
Patient called to get his results from his CT Scan

## 2020-11-18 ENCOUNTER — Telehealth: Payer: Self-pay | Admitting: Gastroenterology

## 2020-11-18 ENCOUNTER — Encounter: Payer: Self-pay | Admitting: Internal Medicine

## 2020-11-18 NOTE — Telephone Encounter (Signed)
See results note 2/9

## 2020-11-18 NOTE — Telephone Encounter (Signed)
I called and spoke with the pt and he will call back to schedule the appt for colon with Dr Hilarie Fredrickson.

## 2020-11-18 NOTE — Telephone Encounter (Signed)
Inbound call from patient returning your call in regards to CT scan results.

## 2020-12-02 ENCOUNTER — Emergency Department (HOSPITAL_BASED_OUTPATIENT_CLINIC_OR_DEPARTMENT_OTHER): Payer: Managed Care, Other (non HMO)

## 2020-12-02 ENCOUNTER — Encounter (HOSPITAL_BASED_OUTPATIENT_CLINIC_OR_DEPARTMENT_OTHER): Payer: Self-pay | Admitting: *Deleted

## 2020-12-02 ENCOUNTER — Emergency Department (HOSPITAL_BASED_OUTPATIENT_CLINIC_OR_DEPARTMENT_OTHER)
Admission: EM | Admit: 2020-12-02 | Discharge: 2020-12-02 | Disposition: A | Discharge status: Home / Self Care | Payer: Managed Care, Other (non HMO) | Attending: Emergency Medicine | Admitting: Emergency Medicine

## 2020-12-02 ENCOUNTER — Other Ambulatory Visit: Payer: Self-pay

## 2020-12-02 DIAGNOSIS — R350 Frequency of micturition: Secondary | ICD-10-CM | POA: Diagnosis not present

## 2020-12-02 DIAGNOSIS — Z7982 Long term (current) use of aspirin: Secondary | ICD-10-CM | POA: Diagnosis not present

## 2020-12-02 DIAGNOSIS — R110 Nausea: Secondary | ICD-10-CM | POA: Diagnosis not present

## 2020-12-02 DIAGNOSIS — Z91010 Allergy to peanuts: Secondary | ICD-10-CM | POA: Diagnosis not present

## 2020-12-02 DIAGNOSIS — R519 Headache, unspecified: Secondary | ICD-10-CM | POA: Diagnosis not present

## 2020-12-02 DIAGNOSIS — M7918 Myalgia, other site: Secondary | ICD-10-CM | POA: Insufficient documentation

## 2020-12-02 DIAGNOSIS — R109 Unspecified abdominal pain: Secondary | ICD-10-CM | POA: Insufficient documentation

## 2020-12-02 LAB — URINALYSIS, ROUTINE W REFLEX MICROSCOPIC
Bilirubin Urine: NEGATIVE
Glucose, UA: NEGATIVE mg/dL
Hgb urine dipstick: NEGATIVE
Ketones, ur: NEGATIVE mg/dL
Leukocytes,Ua: NEGATIVE
Nitrite: NEGATIVE
Protein, ur: NEGATIVE mg/dL
Specific Gravity, Urine: 1.02 (ref 1.005–1.030)
pH: 6.5 (ref 5.0–8.0)

## 2020-12-02 LAB — COMPREHENSIVE METABOLIC PANEL
ALT: 22 U/L (ref 0–44)
AST: 20 U/L (ref 15–41)
Albumin: 3.8 g/dL (ref 3.5–5.0)
Alkaline Phosphatase: 72 U/L (ref 38–126)
Anion gap: 9 (ref 5–15)
BUN: 13 mg/dL (ref 6–20)
CO2: 25 mmol/L (ref 22–32)
Calcium: 8.8 mg/dL — ABNORMAL LOW (ref 8.9–10.3)
Chloride: 103 mmol/L (ref 98–111)
Creatinine, Ser: 1.26 mg/dL — ABNORMAL HIGH (ref 0.61–1.24)
GFR, Estimated: >60 mL/min (ref >60)
Glucose, Bld: 103 mg/dL — ABNORMAL HIGH (ref 70–99)
Potassium: 3.7 mmol/L (ref 3.5–5.1)
Sodium: 137 mmol/L (ref 135–145)
Total Bilirubin: 0.6 mg/dL (ref 0.3–1.2)
Total Protein: 7.4 g/dL (ref 6.5–8.1)

## 2020-12-02 LAB — CBC WITH DIFFERENTIAL/PLATELET
Abs Immature Granulocytes: 0.03 10*3/uL (ref 0.00–0.07)
Basophils Absolute: 0 10*3/uL (ref 0.0–0.1)
Basophils Relative: 0 %
Eosinophils Absolute: 0.2 10*3/uL (ref 0.0–0.5)
Eosinophils Relative: 2 %
HCT: 45.8 % (ref 39.0–52.0)
Hemoglobin: 15.3 g/dL (ref 13.0–17.0)
Immature Granulocytes: 0 %
Lymphocytes Relative: 16 %
Lymphs Abs: 1.4 10*3/uL (ref 0.7–4.0)
MCH: 29.5 pg (ref 26.0–34.0)
MCHC: 33.4 g/dL (ref 30.0–36.0)
MCV: 88.4 fL (ref 80.0–100.0)
Monocytes Absolute: 0.7 10*3/uL (ref 0.1–1.0)
Monocytes Relative: 9 %
Neutro Abs: 6 10*3/uL (ref 1.7–7.7)
Neutrophils Relative %: 73 %
Platelets: 262 10*3/uL (ref 150–400)
RBC: 5.18 MIL/uL (ref 4.22–5.81)
RDW: 13.9 % (ref 11.5–15.5)
WBC: 8.4 10*3/uL (ref 4.0–10.5)
nRBC: 0 % (ref 0.0–0.2)

## 2020-12-02 LAB — LIPASE, BLOOD: Lipase: 25 U/L (ref 11–51)

## 2020-12-02 MED ORDER — SODIUM CHLORIDE 0.9 % IV BOLUS
1000.0000 mL | Freq: Once | INTRAVENOUS | Status: AC
  Administered 2020-12-02: 1000 mL via INTRAVENOUS

## 2020-12-02 MED ORDER — SODIUM CHLORIDE 0.9 % IV SOLN: INTRAVENOUS | Status: DC

## 2020-12-02 NOTE — Discharge Instructions (Addendum)
Follow-up with your GI doctor for further evaluation.  Return to the ED for fevers vomiting or other concerning symptoms.

## 2020-12-02 NOTE — ED Triage Notes (Addendum)
C/o abd pain x 1 day , ct scan done 11/16/20  Ordered by GI. Scheduled Colonoscopy for april

## 2020-12-02 NOTE — ED Provider Notes (Signed)
[name] Provider Note   CSN: [id number] Arrival date & time: 12/02/20  1804     History Chief Complaint  Patient presents with  . Abdominal Pain    Francis Lin is a 45 y.o. male.  HPI   Patient presented to the ED for evaluation after having an episode of abdominal pain associated with other symptoms.  Patient states today he started having pain in his abdomen that was going up towards his chest.  He started having headache that he felt like was a migraine.  He was nauseated but did not vomit.  He had several episodes of loose stools as well as urinary frequency.  Patient also started having aching in his lower legs.  Patient took some ibuprofen and something for nausea.  His symptoms have improved since then.  He denies having any fevers.  He is not having any chest pain or shortness of breath right now.  Patient does have history of recent issues with abdominal pain.  He had a's CAT scan done on February 7 of this year.  It showed findings suggestive of inflammatory bowel disease.  Patient states he is scheduled for a colonoscopy in April.  Past Medical History:  Diagnosis Date  . Acid reflux   . DVT (deep venous thrombosis) ([place])   . Esophagitis   . Hypersomnia, persistent   . IBS (irritable bowel syndrome)   . Shifting sleep-work schedule, affecting sleep 06/20/2013  . Snoring disorder 06/20/2013    Patient Active Problem List   Diagnosis Date Noted  . LUQ abdominal pain 11/03/2020  . Generalized abdominal pain 11/03/2020  . Altered bowel habits 11/03/2020  . Nausea without vomiting 11/03/2020  . Migraine without aura and without status migrainosus, not intractable 11/22/2018  . Nonsustained ventricular tachycardia ([place]) 08/03/2018  . Elevated serum creatinine 06/18/2018  . Slow heart rate 06/06/2018  . Shortness of breath 04/04/2018  . Venous insufficiency 01/05/2017  . Deep vein thrombosis (DVT) of popliteal vein of right lower  extremity ([place]) 06/30/2016  . S/P ORIF (open reduction internal fixation) fracture 06/22/2016  . Multiple fractures of both lower extremities 05/31/2016  . Elevated CK 05/17/2016  . Fracture of right fibula 05/17/2016  . Fracture of right tibia 05/17/2016  . Fracture of tibia, left, closed 05/17/2016  . Trauma 05/17/2016  . Situational anxiety 08/28/2014  . Unspecified vitamin D deficiency 09/22/2013  . Shifting sleep-work schedule, affecting sleep 06/20/2013  . Snoring disorder 06/20/2013  . Hypersomnia, persistent   . History of esophageal stricture 04/16/2013  . Pain in joint, ankle and foot 04/16/2013  . Gout 04/16/2013  . 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 OBESITY, CLASS II, BMI 35- 39.9 03/15/2012  . Hypertriglyceridemia 03/15/2012  . Atypical chest pain 12/02/2008  . Dysphagia 12/02/2008    Past Surgical History:  Procedure Laterality Date  . ORIF TIBIA & FIBULA FRACTURES         Family History  Problem Relation Age of Onset  . Lupus Mother   . Heart attack Father   . Arthritis Father   . Hypertension Father   . Cancer Father        multiple myeloma  . Diabetes Father   . Edema Sister   . Diabetes Sister   . Bipolar disorder Sister     Social History   Tobacco Use  . Smoking status: Never Smoker  . Smokeless tobacco: Never Used  Vaping Use  . Vaping Use: Never used  Substance Use  Topics  . Alcohol use: No  . Drug use: No    Home Medications Prior to Admission medications   Medication Sig Start Date End Date Taking? Authorizing Provider  acetaminophen (TYLENOL) 325 MG tablet Take 650 mg by mouth every 6 (six) hours as needed for mild pain.    [provider]  aspirin EC 81 MG tablet Take 81 mg by mouth every 6 (six) hours as needed for mild pain.     [provider]  cetirizine (ZYRTEC) 10 MG tablet Take 1 tablet (10 mg total) by mouth daily. Patient taking differently: Take 10 mg by mouth daily as needed for allergies.  04/09/15   [place], Tishira R, PA-C  colchicine 0.6 MG tablet Take by mouth. 12/24/18   [provider]  fluticasone (FLONASE) 50 MCG/ACT nasal spray Place 1 spray into both nostrils daily as needed for allergies or rhinitis.    [provider]  ibuprofen (ADVIL) 200 MG tablet Take 200 mg by mouth every 6 (six) hours as needed for fever or mild pain.    [provider]  meloxicam (MOBIC) 15 MG tablet Take 15 mg by mouth daily as needed for pain.  02/12/20   [provider]  predniSONE (DELTASONE) 20 MG tablet Take two tablets by mouth once daily for 6 days 11/06/20   [name], MD  cimetidine (TAGAMET) 200 MG tablet Take by mouth. 01/30/19 01/16/20  [provider]    Allergies    Lactose, Caffeine, Omeprazole, Peanuts [peanut oil], Penicillins, Seasonal ic [cholestatin], and Lactose intolerance (gi)  Review of Systems   Review of Systems  All other systems reviewed and are negative.   Physical Exam Updated Vital Signs BP 135/88   Pulse 76   Temp 98.2 F (36.8 C)   Resp 20   Ht 1.905 m ($Remove'6\' 3"'SYRASNI$ )   Wt (!) 149.7 kg   SpO2 100%   BMI 41.25 kg/m   Physical Exam Vitals and nursing note reviewed.  Constitutional:      General: He is not in acute distress.    Appearance: He is well-developed and well-nourished.  HENT:     Head: Normocephalic and atraumatic.     Right Ear: External ear normal.     Left Ear: External ear normal.  Eyes:     General: No scleral icterus.       Right eye: No discharge.        Left eye: No discharge.     Conjunctiva/sclera: Conjunctivae normal.  Neck:     Trachea: No tracheal deviation.  Cardiovascular:     Rate and Rhythm: Normal rate and regular rhythm.     Pulses: Intact distal pulses.  Pulmonary:     Effort: Pulmonary effort is normal. No respiratory distress.     Breath sounds: Normal breath sounds. No stridor. No wheezing or rales.  Abdominal:     General: Bowel sounds are normal. There is no  distension.     Palpations: Abdomen is soft.     Tenderness: There is no abdominal tenderness. There is no guarding or rebound.  Musculoskeletal:        General: No tenderness or edema.     Cervical back: Neck supple.  Skin:    General: Skin is warm and dry.     Findings: No rash.  Neurological:     Mental Status: He is alert.     Cranial Nerves: No cranial nerve deficit (no facial droop, extraocular movements intact, no  slurred speech).     Sensory: No sensory deficit.     Motor: No abnormal muscle tone or seizure activity.     Coordination: Coordination normal.     Deep Tendon Reflexes: Strength normal.  Psychiatric:        Mood and Affect: Mood and affect normal.     ED Results / Procedures / Treatments   Labs (all labs ordered are listed, but only abnormal results are displayed) Labs Reviewed  COMPREHENSIVE METABOLIC PANEL - Abnormal; Notable for the following components:      Result Value   Glucose, Bld 103 (*)    Creatinine, Ser 1.26 (*)    Calcium 8.8 (*)    All other components within normal limits  LIPASE, BLOOD  CBC WITH DIFFERENTIAL/PLATELET  URINALYSIS, ROUTINE W REFLEX MICROSCOPIC    EKG EKG Interpretation  Date/Time:  Wednesday December 02 2020 18:35:11 EST Ventricular Rate:  74 PR Interval:    QRS Duration: 95 QT Interval:  377 QTC Calculation: 419 R Axis:   36 Text Interpretation: Sinus rhythm No significant change since last tracing Confirmed by Dorie Rank [phone]) on 12/02/2020 6:46:30 PM   Radiology DG Abdomen Acute W/Chest  Result Date: 12/02/2020 CLINICAL DATA:  Abdominal pain for 1 day. EXAM: DG ABDOMEN ACUTE WITH 1 VIEW CHEST COMPARISON:  11/16/2020 FINDINGS: There is no evidence of dilated bowel loops or free intraperitoneal air. No radiopaque calculi or other significant radiographic abnormality is seen. Heart size and mediastinal contours are within normal limits. Both lungs are clear. IMPRESSION: Negative abdominal radiographs.  No acute  cardiopulmonary disease. Electronically Signed   By: Kerby Moors M.D.   On: 12/02/2020 19:18    Procedures Procedures   Medications Ordered in ED Medications  sodium chloride 0.9 % bolus 1,000 mL ( Intravenous Infusion Verify 12/02/20 1920)    And  0.9 %  sodium chloride infusion (has no administration in time range)    ED Course  I have reviewed the triage vital signs and the nursing notes.  Pertinent labs & imaging results that were available during my care of the patient were reviewed by me and considered in my medical decision making (see chart for details).  Clinical Course as of 12/02/20 2012  Wed Dec 02, 2020  1945 CBC normal.  UA normal.  Lipase normal. [JK]  0932 Metabolic panel stable [JK]  1947 DG Abdomen Acute W/Chest [JK]  1947 Abdominal series without acute findings [JK]    Clinical Course User Index [JK] Dorie Rank, MD   MDM Rules/Calculators/A&P                          Patient presented to the ED with a variety of complaints but primarily abdominal discomfort.  Patient recently had a CT scan suggesting the possibility of Crohn's disease.  In the ED the patient was feeling better.  His abdominal exam was benign.  Laboratory tests are reassuring.  No signs of leukocytosis.  No AKI.  EKG and x-rays are unremarkable.  Doubt acute infection.  Doubt obstruction.  Patient has not had any episodes of vomiting or diarrhea here in the ED.  He is comfortable and has not required any medications for pain or nausea.  Possibly he may be having some persistent symptoms associated with this disease finding.  At this time he appears stable for discharge and outpatient GI follow-up.  Warning signs precautions discussed. Final Clinical Impression(s) / ED Diagnoses Final diagnoses:  Abdominal  pain, unspecified abdominal location    Rx / DC Orders ED Discharge Orders    None       Dorie Rank, MD 12/02/20 2012

## 2020-12-28 ENCOUNTER — Other Ambulatory Visit: Payer: Self-pay

## 2020-12-28 ENCOUNTER — Ambulatory Visit (AMBULATORY_SURGERY_CENTER): Payer: Self-pay | Admitting: *Deleted

## 2020-12-28 VITALS — Ht 74.5 in | Wt 338.0 lb

## 2020-12-28 DIAGNOSIS — R9389 Abnormal findings on diagnostic imaging of other specified body structures: Secondary | ICD-10-CM

## 2020-12-28 MED ORDER — SUPREP BOWEL PREP KIT 17.5-3.13-1.6 GM/177ML PO SOLN: 1.0000 | Freq: Once | ORAL | 0 refills | Status: AC

## 2020-12-28 NOTE — Progress Notes (Signed)
No soy allergy known to patient - eggs cause nausea and Migraines - eats foods with eggs in them   issues with past sedation with any surgeries or procedures of nausea  Patient denies ever being told they had issues or difficulty with intubation  No FH of Malignant Hyperthermia No diet pills per patient No home 02 use per patient  No blood thinners per patient  Pt denies issues with constipation  No A fib or A flutter  EMMI video to pt or via [place] 19 guidelines implemented in [place] today with Pt and RN  Pt is fully vaccinated  for [employer] given to pt in PV today , Code to Pharmacy and  NO PA's for preps discussed with pt In PV today  Discussed with pt there will be an out-of-pocket cost for prep and that varies from $0 to 70 dollars   Due to the COVID-19 pandemic we are asking patients to follow certain guidelines.  Pt aware of COVID protocols and [facility] guidelines

## 2021-01-04 ENCOUNTER — Ambulatory Visit: Payer: Managed Care, Other (non HMO) | Admitting: Podiatry

## 2021-01-07 ENCOUNTER — Encounter: Payer: Self-pay | Admitting: Internal Medicine

## 2021-01-11 ENCOUNTER — Other Ambulatory Visit: Payer: Self-pay

## 2021-01-11 ENCOUNTER — Encounter: Payer: Self-pay | Admitting: Internal Medicine

## 2021-01-11 ENCOUNTER — Ambulatory Visit (AMBULATORY_SURGERY_CENTER): Payer: Managed Care, Other (non HMO) | Admitting: Internal Medicine

## 2021-01-11 VITALS — BP 101/69 | HR 72 | Temp 97.3°F | Resp 17 | Ht 74.5 in | Wt 338.0 lb

## 2021-01-11 DIAGNOSIS — R9389 Abnormal findings on diagnostic imaging of other specified body structures: Secondary | ICD-10-CM | POA: Diagnosis not present

## 2021-01-11 DIAGNOSIS — D125 Benign neoplasm of sigmoid colon: Secondary | ICD-10-CM

## 2021-01-11 DIAGNOSIS — D123 Benign neoplasm of transverse colon: Secondary | ICD-10-CM

## 2021-01-11 DIAGNOSIS — K648 Other hemorrhoids: Secondary | ICD-10-CM

## 2021-01-11 MED ORDER — SODIUM CHLORIDE 0.9 % IV SOLN: 500.0000 mL | Freq: Once | INTRAVENOUS | Status: DC

## 2021-01-11 NOTE — Progress Notes (Signed)
Vital signs by CW.  Pt's states no medical or surgical changes since previsit or office visit.

## 2021-01-11 NOTE — Progress Notes (Signed)
Called to room to assist during endoscopic procedure.  Patient ID and intended procedure confirmed with present staff. Received instructions for my participation in the procedure from the performing physician.  

## 2021-01-11 NOTE — Patient Instructions (Signed)
Handouts provided on polyps and hemorrhoids.   No aspirin, ibuprofen, naproxen, or other non-steriodal anti-inflammatory drugs for 2 weeks after polyp removal.    YOU HAD AN ENDOSCOPIC PROCEDURE TODAY AT [facility]:   Refer to the procedure report that was given to you for any specific questions about what was found during the examination.  If the procedure report does not answer your questions, please call your gastroenterologist to clarify.  If you requested that your care partner not be given the details of your procedure findings, then the procedure report has been included in a sealed envelope for you to review at your convenience later.  YOU SHOULD EXPECT: Some feelings of bloating in the abdomen. Passage of more gas than usual.  Walking can help get rid of the air that was put into your GI tract during the procedure and reduce the bloating. If you had a lower endoscopy (such as a colonoscopy or flexible sigmoidoscopy) you may notice spotting of blood in your stool or on the toilet paper. If you underwent a bowel prep for your procedure, you may not have a normal bowel movement for a few days.  Please Note:  You might notice some irritation and congestion in your nose or some drainage.  This is from the oxygen used during your procedure.  There is no need for concern and it should clear up in a day or so.  SYMPTOMS TO REPORT IMMEDIATELY:   Following lower endoscopy (colonoscopy or flexible sigmoidoscopy):  Excessive amounts of blood in the stool  Significant tenderness or worsening of abdominal pains  Swelling of the abdomen that is new, acute  Fever of 100F or higher  For urgent or emergent issues, a gastroenterologist can be reached at any hour by calling [phone]. Do not use MyChart messaging for urgent concerns.    DIET:  We do recommend a small meal at first, but then you may proceed to your regular diet.  Drink plenty of fluids but you should avoid  alcoholic beverages for 24 hours.  ACTIVITY:  You should plan to take it easy for the rest of today and you should NOT DRIVE or use heavy machinery until tomorrow (because of the sedation medicines used during the test).    FOLLOW UP: Our staff will call the number listed on your records 48-72 hours following your procedure to check on you and address any questions or concerns that you may have regarding the information given to you following your procedure. If we do not reach you, we will leave a message.  We will attempt to reach you two times.  During this call, we will ask if you have developed any symptoms of COVID 19. If you develop any symptoms (ie: fever, flu-like symptoms, shortness of breath, cough etc.) before then, please call [phone].  If you test positive for Covid 19 in the 2 weeks post procedure, please call and report this information to [place].    If any biopsies were taken you will be contacted by phone or by letter within the next 1-3 weeks.  Please call us at [phone] if you have not heard about the biopsies in 3 weeks.    SIGNATURES/CONFIDENTIALITY: You and/or your care partner have signed paperwork which will be entered into your electronic medical record.  These signatures attest to the fact that that the information above on your After Visit Summary has been reviewed and is understood.  Full responsibility of the confidentiality of this discharge  information lies with you and/or your care-partner.

## 2021-01-11 NOTE — Op Note (Signed)
[facility] Patient Name: Francis Lin Procedure Date: 01/11/2021 10:46 AM MRN: [id number] Endoscopist: Jerene Bears , MD Age: 45 Referring MD:  Date of Birth: 11-09-1975 Gender: Male Account #: [id number] Procedure:                Colonoscopy Indications:              Abnormal CT of the GI tract Medicines:                Monitored Anesthesia Care Procedure:                Pre-Anesthesia Assessment:                           - Prior to the procedure, a History and Physical                            was performed, and patient medications and                            allergies were reviewed. The patient's tolerance of                            previous anesthesia was also reviewed. The risks                            and benefits of the procedure and the sedation                            options and risks were discussed with the patient.                            All questions were answered, and informed consent                            was obtained. Prior Anticoagulants: The patient has                            taken no previous anticoagulant or antiplatelet                            agents. ASA Grade Assessment: III - A patient with                            severe systemic disease. After reviewing the risks                            and benefits, the patient was deemed in                            satisfactory condition to undergo the procedure.                           After obtaining informed consent, the colonoscope  was passed under direct vision. Throughout the                            procedure, the patient's blood pressure, pulse, and                            oxygen saturations were monitored continuously. The                            Olympus PFC-[id number] (#[id number]) Colonoscope was                            introduced through the anus and advanced to the 15                            cm into the ileum. The colonoscopy was  performed                            without difficulty. The patient tolerated the                            procedure well. The quality of the bowel                            preparation was good. The terminal ileum, ileocecal                            valve, appendiceal orifice, and rectum were                            photographed. Scope In: 10:57:35 AM Scope Out: 11:13:20 AM Scope Withdrawal Time: 0 hours 14 minutes 23 seconds  Total Procedure Duration: 0 hours 15 minutes 45 seconds  Findings:                 The digital rectal exam was normal.                           The terminal ileum appeared normal. No evidence of                            Crohn's or other abnormality.                           Two sessile polyps were found in the transverse                            colon. The polyps were 4 to 6 mm in size. These                            polyps were removed with a cold snare. Resection                            and retrieval were complete.  A 10 mm polyp was found in the sigmoid colon. The                            polyp was pedunculated. The polyp was removed with                            a hot snare. Resection and retrieval were complete.                           Internal hemorrhoids were found during                            retroflexion. The hemorrhoids were small. There                            were post-hemorrhoidal banding scars visible in the                            distal rectum. Complications:            No immediate complications. Estimated Blood Loss:     Estimated blood loss was minimal. Impression:               - The examined portion of the ileum was normal.                           - Two 4 to 6 mm polyps in the transverse colon,                            removed with a cold snare. Resected and retrieved.                           - One 10 mm polyp in the sigmoid colon, removed                            with a  hot snare. Resected and retrieved.                           - Small internal hemorrhoids with scarring in the                            distal rectum from previous hemorrhoidal banding.                           - No evidence for Crohn's or IBD. Recommendation:           - Patient has a contact number available for                            emergencies. The signs and symptoms of potential                            delayed complications were discussed with the  patient. Return to normal activities tomorrow.                            Written discharge instructions were provided to the                            patient.                           - Resume previous diet.                           - Continue present medications.                           - No aspirin, ibuprofen, naproxen, or other                            non-steroidal anti-inflammatory drugs for 2 weeks                            after polyp removal.                           - Await pathology results.                           - Repeat colonoscopy is recommended for                            surveillance. The colonoscopy date will be                            determined after pathology results from today's                            exam become available for review. Jerene Bears, MD 01/11/2021 11:19:28 AM This report has been signed electronically.

## 2021-01-11 NOTE — Progress Notes (Signed)
Report given to PACU, vss 

## 2021-01-12 ENCOUNTER — Telehealth: Payer: Self-pay | Admitting: *Deleted

## 2021-01-12 NOTE — Telephone Encounter (Signed)
Patient has been scheduled for banding 02/16/21 at 3:40 pm. I have left a voicemail for him to call back.

## 2021-01-12 NOTE — Telephone Encounter (Signed)
-----   Message from Jerene Bears, MD sent at 01/11/2021 11:27 AM EDT ----- Needs a single banding appt Previously banded by Romelle Starcher, with 1 residual hemorrhoid

## 2021-01-12 NOTE — Telephone Encounter (Signed)
Patient has requested to change date to 04/16/21 at 3:40 pm. Appointment has been changed.

## 2021-01-13 ENCOUNTER — Other Ambulatory Visit: Payer: Self-pay

## 2021-01-13 ENCOUNTER — Ambulatory Visit: Payer: Managed Care, Other (non HMO) | Admitting: Podiatry

## 2021-01-13 ENCOUNTER — Other Ambulatory Visit: Payer: Self-pay | Admitting: Podiatry

## 2021-01-13 ENCOUNTER — Ambulatory Visit (INDEPENDENT_AMBULATORY_CARE_PROVIDER_SITE_OTHER): Payer: Managed Care, Other (non HMO)

## 2021-01-13 ENCOUNTER — Telehealth: Payer: Self-pay

## 2021-01-13 ENCOUNTER — Encounter: Payer: Self-pay | Admitting: Podiatry

## 2021-01-13 DIAGNOSIS — M1000 Idiopathic gout, unspecified site: Secondary | ICD-10-CM

## 2021-01-13 DIAGNOSIS — M109 Gout, unspecified: Secondary | ICD-10-CM

## 2021-01-13 DIAGNOSIS — M7661 Achilles tendinitis, right leg: Secondary | ICD-10-CM

## 2021-01-13 MED ORDER — TRIAMCINOLONE ACETONIDE 10 MG/ML IJ SUSP: 10.0000 mg | Freq: Once | INTRAMUSCULAR | Status: DC

## 2021-01-13 NOTE — Patient Instructions (Signed)
Gout  Gout is painful swelling of your joints. Gout is a type of arthritis. It is caused by having too much uric acid in your body. Uric acid is a chemical that is made when your body breaks down substances called purines. If your body has too much uric acid, sharp crystals can form and build up in your joints. This causes pain and swelling. Gout attacks can happen quickly and be very painful (acute gout). Over time, the attacks can affect more joints and happen more often (chronic gout). What are the causes?  Too much uric acid in your blood. This can happen because: ? Your kidneys do not remove enough uric acid from your blood. ? Your body makes too much uric acid. ? You eat too many foods that are high in purines. These foods include organ meats, some seafood, and beer.  Trauma or stress. What increases the risk?  Having a family history of gout.  Being male and middle-aged.  Being male and having gone through menopause.  Being very overweight (obese).  Drinking alcohol, especially beer.  Not having enough water in the body (being dehydrated).  Losing weight too quickly.  Having an organ transplant.  Having lead poisoning.  Taking certain medicines.  Having kidney disease.  Having a skin condition called psoriasis. What are the signs or symptoms? An attack of acute gout usually happens in just one joint. The most common place is the big toe. Attacks often start at night. Other joints that may be affected include joints of the feet, ankle, knee, fingers, wrist, or elbow. Symptoms of an attack may include:  Very bad pain.  Warmth.  Swelling.  Stiffness.  Shiny, red, or purple skin.  Tenderness. The affected joint may be very painful to touch.  Chills and fever. Chronic gout may cause symptoms more often. More joints may be involved. You may also have white or yellow lumps (tophi) on your hands or feet or in other areas near your joints.   How is this  treated?  Treatment for this condition has two phases: treating an acute attack and preventing future attacks.  Acute gout treatment may include: ? NSAIDs. ? Steroids. These are taken by mouth or injected into a joint. ? Colchicine. This medicine relieves pain and swelling. It can be given by mouth or through an IV tube.  Preventive treatment may include: ? Taking small doses of NSAIDs or colchicine daily. ? Using a medicine that reduces uric acid levels in your blood. ? Making changes to your diet. You may need to see a food expert (dietitian) about what to eat and drink to prevent gout. Follow these instructions at home: During a gout attack  If told, put ice on the painful area: ? Put ice in a plastic bag. ? Place a towel between your skin and the bag. ? Leave the ice on for 20 minutes, 2-3 times a day.  Raise (elevate) the painful joint above the level of your heart as often as you can.  Rest the joint as much as possible. If the joint is in your leg, you may be given crutches.  Follow instructions from your doctor about what you cannot eat or drink.   Avoiding future gout attacks  Eat a low-purine diet. Avoid foods and drinks such as: ? Liver. ? Kidney. ? Anchovies. ? Asparagus. ? Herring. ? Mushrooms. ? Mussels. ? Beer.  Stay at a healthy weight. If you want to lose weight, talk with your doctor. Do   not lose weight too fast.  Start or continue an exercise plan as told by your doctor. Eating and drinking  Drink enough fluids to keep your pee (urine) pale yellow.  If you drink alcohol: ? Limit how much you use to:  0-1 drink a day for women.  0-2 drinks a day for men. ? Be aware of how much alcohol is in your drink. In the [place] one drink equals one 12 oz bottle of beer (355 mL), one 5 oz glass of wine (148 mL), or one 1 oz glass of hard liquor (44 mL). General instructions  Take over-the-counter and prescription medicines only as told by your doctor.  Do  not drive or use heavy machinery while taking prescription pain medicine.  Return to your normal activities as told by your doctor. Ask your doctor what activities are safe for you.  Keep all follow-up visits as told by your doctor. This is important. Contact a doctor if:  You have another gout attack.  You still have symptoms of a gout attack after 10 days of treatment.  You have problems (side effects) because of your medicines.  You have chills or a fever.  You have burning pain when you pee (urinate).  You have pain in your lower back or belly. Get help right away if:  You have very bad pain.  Your pain cannot be controlled.  You cannot pee. Summary  Gout is painful swelling of the joints.  The most common site of pain is the big toe, but it can affect other joints.  Medicines and avoiding some foods can help to prevent and treat gout attacks. This information is not intended to replace advice given to you by your health care provider. Make sure you discuss any questions you have with your health care provider. Document Revised: 04/18/2018 Document Reviewed: 04/18/2018 Elsevier Patient Education  2021 [employer]  

## 2021-01-13 NOTE — Progress Notes (Signed)
Subjective:   Patient ID: Francis Lin, male   DOB: 45 y.o.   MRN: [id number]   HPI Patient states that he is developed a lot of pain in the back of his right heel and also has inflammation left and states that he had a colonoscopy and feels like it may triggered after that.  Patient is obese which is complicating factor and has had gout but has not taken medicine for it   ROS      Objective:  Physical Exam  Neurovascular status found to be intact with acute inflammation of the posterior medial heel right with inflammation noted and also noted to have a small area of inflammation on the medial side of the left ankle.  Both are quite tender when palpated     Assessment:  Probability for acute Achilles tendinitis and patient appears to have gout-like symptomatology and does not have good understanding of foods to be careful     Plan:  H&P reviewed all conditions and discussed injection right explaining chances for rupture.  Patient wants procedure understanding risk and today I did sterile prep carefully injected the medial side staying away from central lateral 3 mg dexamethasone Kenalog 5 mg Xylocaine and went ahead today and I discussed gout and gave him instructions on foods to be careful  X-rays indicate that there is no signs of chronic spur formation stress fracture or advanced arthritis bilateral

## 2021-01-13 NOTE — Telephone Encounter (Signed)
  Follow up Call-  Call back number 01/11/2021  Post procedure Call Back phone  # [phone]  Permission to leave phone message Yes  Some recent data might be hidden     Patient questions:  Do you have a fever, pain , or abdominal swelling? No. Pain Score  0 *  Have you tolerated food without any problems? Yes.    Have you been able to return to your normal activities? Yes.    Do you have any questions about your discharge instructions: Diet   No. Medications  No. Follow up visit  No.  Do you have questions or concerns about your Care? No.  Actions: * If pain score is 4 or above: No action needed, pain <4. 1. Have you developed a fever since your procedure? no  2.   Have you had an respiratory symptoms (SOB or cough) since your procedure? no  3.   Have you tested positive for COVID 19 since your procedure no  4.   Have you had any family members/close contacts diagnosed with the COVID 19 since your procedure?  no   If yes to any of these questions please route to Joylene John, RN and Joella Prince, RN

## 2021-01-14 ENCOUNTER — Encounter: Payer: Self-pay | Admitting: Internal Medicine

## 2021-01-14 ENCOUNTER — Encounter: Payer: Self-pay | Admitting: Podiatry

## 2021-01-21 ENCOUNTER — Encounter: Payer: Self-pay | Admitting: Podiatry

## 2021-01-21 ENCOUNTER — Ambulatory Visit: Payer: Managed Care, Other (non HMO) | Admitting: Podiatry

## 2021-01-21 ENCOUNTER — Other Ambulatory Visit: Payer: Self-pay

## 2021-01-21 DIAGNOSIS — M778 Other enthesopathies, not elsewhere classified: Secondary | ICD-10-CM | POA: Diagnosis not present

## 2021-01-21 DIAGNOSIS — M1000 Idiopathic gout, unspecified site: Secondary | ICD-10-CM

## 2021-01-21 MED ORDER — PREDNISONE 10 MG PO TABS: ORAL_TABLET | ORAL | 0 refills | Status: DC

## 2021-01-21 MED ORDER — TRIAMCINOLONE ACETONIDE 10 MG/ML IJ SUSP
10.0000 mg | Freq: Once | INTRAMUSCULAR | Status: AC
  Administered 2021-01-21: 10 mg

## 2021-01-22 LAB — C-REACTIVE PROTEIN: CRP: 25.4 mg/L — ABNORMAL HIGH (ref <8.0)

## 2021-01-22 LAB — CBC WITH DIFFERENTIAL/PLATELET
Absolute Monocytes: 767 cells/uL (ref 200–950)
Basophils Absolute: 72 cells/uL (ref 0–200)
Basophils Relative: 1.1 %
Eosinophils Absolute: 644 cells/uL — ABNORMAL HIGH (ref 15–500)
Eosinophils Relative: 9.9 %
HCT: 45.5 % (ref 38.5–50.0)
Hemoglobin: 14.7 g/dL (ref 13.2–17.1)
Lymphs Abs: 2009 cells/uL (ref 850–3900)
MCH: 28.8 pg (ref 27.0–33.0)
MCHC: 32.3 g/dL (ref 32.0–36.0)
MCV: 89 fL (ref 80.0–100.0)
MPV: 9.5 fL (ref 7.5–12.5)
Monocytes Relative: 11.8 %
Neutro Abs: 3010 cells/uL (ref 1500–7800)
Neutrophils Relative %: 46.3 %
Platelets: 305 10*3/uL (ref 140–400)
RBC: 5.11 10*6/uL (ref 4.20–5.80)
RDW: 13.5 % (ref 11.0–15.0)
Total Lymphocyte: 30.9 %
WBC: 6.5 10*3/uL (ref 3.8–10.8)

## 2021-01-22 LAB — URIC ACID: Uric Acid, Serum: 9.1 mg/dL — ABNORMAL HIGH (ref 4.0–8.0)

## 2021-01-22 LAB — SEDIMENTATION RATE: Sed Rate: 33 mm/h — ABNORMAL HIGH (ref 0–15)

## 2021-01-22 LAB — RHEUMATOID FACTOR: Rheumatoid fact SerPl-aCnc: <14 IU/mL (ref <14)

## 2021-01-24 NOTE — Progress Notes (Signed)
Subjective:   Patient ID: Francis Lin, male   DOB: 45 y.o.   MRN: [id number]   HPI Patient states he still having some swelling in his right foot in a different spot and he states that he is taking colchicine and has had some improvement but quite sore   ROS      Objective:  Physical Exam  Neurovascular status intact with pain of the dorsum of the right foot around the midtarsal joint with swelling of this noted      Assessment:  Possibility for gout versus inflammatory extensor tendinitis right or a combination of the 2     Plan:  H&P reviewed gout and I am sending him for blood work to try to understand better what his uric acid and inflammatory numbers are.  I did go ahead today I did extensor injection 3 mg Dexasone Kenalog 5 mg Xylocaine advised on ice and reappoint to recheck

## 2021-02-02 ENCOUNTER — Telehealth: Payer: Self-pay | Admitting: Internal Medicine

## 2021-02-02 NOTE — Telephone Encounter (Signed)
I would have him add pantoprazole 40 mg once daily -- this will help with stomach/acid peptic issues and protect stomach lining in setting of steroids Ok to try steroids with this medication If symptoms persist despite PPI, then we would consider an EGD Thanks JMP

## 2021-02-02 NOTE — Telephone Encounter (Signed)
Inbound call from patient stating he is currently experiencing symptoms after his procedure and would like to speak with a nurse.  Please advise.

## 2021-02-02 NOTE — Telephone Encounter (Signed)
Pt states he has been having a burning in his stomach. It feels irritated when he swallows and it goes down to his stomach. Pt had a gout flare and has been prescribed prednisone but he is afraid to take it due to his stomach discomfort. Pt wants to know if it is ok to start the prednisone or if he should start something for his stomach. Reports he has only taken his mobic as needed.  Please advise.

## 2021-02-03 ENCOUNTER — Telehealth: Payer: Self-pay | Admitting: *Deleted

## 2021-02-03 MED ORDER — PANTOPRAZOLE SODIUM 40 MG PO TBEC: 40.0000 mg | DELAYED_RELEASE_TABLET | Freq: Every day | ORAL | 3 refills | Status: DC

## 2021-02-03 NOTE — Telephone Encounter (Signed)
Patient is calling to request the results of his blood work in detail  completed on 01/21/21. Please advise.

## 2021-02-03 NOTE — Telephone Encounter (Signed)
Spoke with pt and he is aware, script sent to pharmacy. 

## 2021-02-04 NOTE — Telephone Encounter (Signed)
Returned call to patient and gave blood work results per Dr Regal,verbalized understanding.

## 2021-02-04 NOTE — Telephone Encounter (Signed)
Looks like gout. We will discuss medicine to use to help control

## 2021-02-16 ENCOUNTER — Encounter: Payer: Managed Care, Other (non HMO) | Admitting: Internal Medicine

## 2021-02-16 ENCOUNTER — Other Ambulatory Visit: Payer: Self-pay

## 2021-02-17 ENCOUNTER — Ambulatory Visit: Payer: Managed Care, Other (non HMO) | Admitting: Family Medicine

## 2021-02-18 ENCOUNTER — Ambulatory Visit: Payer: Managed Care, Other (non HMO) | Admitting: Podiatry

## 2021-02-22 ENCOUNTER — Ambulatory Visit: Payer: Managed Care, Other (non HMO) | Admitting: Family Medicine

## 2021-02-22 ENCOUNTER — Encounter: Payer: Self-pay | Admitting: Family Medicine

## 2021-02-22 ENCOUNTER — Other Ambulatory Visit: Payer: Self-pay

## 2021-02-22 VITALS — BP 130/80 | HR 82 | Temp 98.3°F | Wt 329.5 lb

## 2021-02-22 DIAGNOSIS — Z833 Family history of diabetes mellitus: Secondary | ICD-10-CM | POA: Diagnosis not present

## 2021-02-22 DIAGNOSIS — R609 Edema, unspecified: Secondary | ICD-10-CM

## 2021-02-22 DIAGNOSIS — R7982 Elevated C-reactive protein (CRP): Secondary | ICD-10-CM

## 2021-02-22 DIAGNOSIS — H5713 Ocular pain, bilateral: Secondary | ICD-10-CM

## 2021-02-22 LAB — BASIC METABOLIC PANEL
BUN: 17 mg/dL (ref 6–23)
CO2: 28 mEq/L (ref 19–32)
Calcium: 9 mg/dL (ref 8.4–10.5)
Chloride: 105 mEq/L (ref 96–112)
Creatinine, Ser: 1.57 mg/dL — ABNORMAL HIGH (ref 0.40–1.50)
GFR: 53.22 mL/min — ABNORMAL LOW (ref >60.00)
Glucose, Bld: 92 mg/dL (ref 70–99)
Potassium: 4.6 mEq/L (ref 3.5–5.1)
Sodium: 140 mEq/L (ref 135–145)

## 2021-02-22 LAB — HEPATIC FUNCTION PANEL
ALT: 17 U/L (ref 0–53)
AST: 13 U/L (ref 0–37)
Albumin: 4 g/dL (ref 3.5–5.2)
Alkaline Phosphatase: 76 U/L (ref 39–117)
Bilirubin, Direct: 0.1 mg/dL (ref 0.0–0.3)
Total Bilirubin: 0.6 mg/dL (ref 0.2–1.2)
Total Protein: 6.9 g/dL (ref 6.0–8.3)

## 2021-02-22 LAB — TSH: TSH: 2.24 u[IU]/mL (ref 0.35–4.50)

## 2021-02-22 LAB — HEMOGLOBIN A1C: Hgb A1c MFr Bld: 5.8 % (ref 4.6–6.5)

## 2021-02-22 LAB — MICROALBUMIN / CREATININE URINE RATIO
Creatinine,U: 135.7 mg/dL
Microalb Creat Ratio: 0.5 mg/g (ref 0.0–30.0)
Microalb, Ur: <0.7 mg/dL (ref 0.0–1.9)

## 2021-02-22 LAB — C-REACTIVE PROTEIN: CRP: 2.3 mg/dL (ref 0.5–20.0)

## 2021-02-22 NOTE — Progress Notes (Signed)
Established Patient Office Visit  Subjective:  Patient ID: Francis Lin, male    DOB: 11-Dec-1975  Age: 45 y.o. MRN: [id number]  CC:  Chief Complaint  Patient presents with  . Edema    Bilateral edema of the lower extremities, getting worse.  Wants blood work. Check bood sugar levels, Eye pain, x 1 year, pain behind the eyes    HPI Francis Lin presents for bilateral leg edema.  This is been going on off and own for 2 years.  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  Occasionally wears compression socks but not consistently.  Edema is usually worse late in the day and improved in the morning when he first gets up.  Denies any orthopnea.  Occasionally has mild dyspnea with activity.  No recent chest pains.  No recent dietary changes.  Does take nonsteroidals occasionally but not daily.  He had recent evaluation with podiatrist.  Was felt to have recent acute gout flare.  C-reactive protein and sed rate were very high.  Rheumatoid factor was negative.  He would like to get follow-up CRP levels.  He relates bilateral eye pain and sensitivity to light frequently.  No acute vision loss.  He apparently saw optometrist at some point in the past year and had pressures checked which were normal.  He would like to see ophthalmologist.  Denies any eye drainage.  No history of diabetes personally but does have very strong family history of type 2 diabetes father and sister.  He is requesting getting blood sugars checked.  Past Medical History:  Diagnosis Date  . Acid reflux   . Allergy   . Anxiety    stress   . Clotting disorder (HCC)    right leg x 2 in calf and behind knee   . DVT (deep venous thrombosis) ([place])   . Esophagitis   . Gout   . Hypersomnia, persistent   . IBS (irritable bowel syndrome)   . PONV (postoperative nausea and vomiting)    nausea with no vomiting   . Shifting sleep-work  schedule, affecting sleep 06/20/2013  . Snoring disorder 06/20/2013    Past Surgical History:  Procedure Laterality Date  . COLONOSCOPY    . HEMORRHOID BANDING    . ORIF TIBIA & FIBULA FRACTURES Right    right leg surgery x 2 places- rod in place knee to ankle   . UPPER GASTROINTESTINAL ENDOSCOPY    . wisdom teeth    . wrist surgery Left     Family History  Problem Relation Age of Onset  . Lupus Mother   . Heart attack Father   . Arthritis Father   . Hypertension Father   . Cancer Father        multiple myeloma  . Diabetes Father   . Edema Sister   . Diabetes Sister   . Bipolar disorder Sister   . Colon cancer Neg Hx   . Colon polyps Neg Hx   . Esophageal cancer Neg Hx   . Rectal cancer Neg Hx   . Stomach cancer Neg Hx     Social History   Socioeconomic History  . Marital status: Married    Spouse name: Not on file  . Number of children: 3  . Years of education: college  . Highest education level: Not on file  Occupational History    Employer:  [employer]    Comment: [employer]  Tobacco Use  . Smoking status: Never Smoker  . Smokeless tobacco: Never Used  Vaping Use  . Vaping Use: Never used  Substance and Sexual Activity  . Alcohol use: No  . Drug use: No  . Sexual activity: Not on file  Other Topics Concern  . Not on file  Social History Narrative  . Not on file   Social Determinants of Health   Financial Resource Strain: Not on file  Food Insecurity: Not on file  Transportation Needs: Not on file  Physical Activity: Not on file  Stress: Not on file  Social Connections: Not on file  Intimate Partner Violence: Not on file    Outpatient Medications Prior to Visit  Medication Sig Dispense Refill  . acetaminophen (TYLENOL) 325 MG tablet Take 650 mg by mouth every 6 (six) hours as needed for mild pain.    Marland Kitchen aspirin EC 81 MG tablet Take 81 mg by mouth every 6 (six) hours as needed for mild pain.    Marland Kitchen colchicine 0.6 MG tablet Take by mouth  as needed.    . fluticasone (FLONASE) 50 MCG/ACT nasal spray Place 1 spray into both nostrils daily as needed for allergies or rhinitis.    Marland Kitchen ibuprofen (ADVIL) 200 MG tablet Take 200 mg by mouth every 6 (six) hours as needed for fever or mild pain.    Marland Kitchen loratadine (CLARITIN) 10 MG tablet Take 10 mg by mouth as needed for allergies.    . meloxicam (MOBIC) 15 MG tablet Take 15 mg by mouth daily as needed for pain.     . pantoprazole (PROTONIX) 40 MG tablet Take 1 tablet (40 mg total) by mouth daily. 90 tablet 3  . predniSONE (DELTASONE) 10 MG tablet 12 day tapering dose 48 tablet 0   Facility-Administered Medications Prior to Visit  Medication Dose Route Frequency Provider Last Rate Last Admin  . triamcinolone acetonide (KENALOG) 10 MG/ML injection 10 mg  10 mg Other Once Wallene Huh, DPM        Allergies  Allergen Reactions  . Lactose Diarrhea and Other (See Comments)    Lactose intolerance Lactose intolerance Lactose intolerance  . Caffeine Other (See Comments)    "makes feel bad", headaches  . Omeprazole Other (See Comments)    Mood swing  . Peanuts [Peanut Oil]   . Penicillins Other (See Comments)    Headaches   . Seasonal Ic [Cholestatin]     Sneezing,runny nose , itchy eyes    ROS Review of Systems  Constitutional: Negative for fatigue and unexpected weight change.  Eyes: Positive for pain. Negative for discharge and redness.  Respiratory: Negative for cough, chest tightness and shortness of breath.   Cardiovascular: Negative for chest pain, palpitations and leg swelling.  Endocrine: Negative for polydipsia and polyuria.  Neurological: Negative for dizziness, syncope, weakness, light-headedness and headaches.      Objective:    Physical Exam Vitals reviewed.  Constitutional:      Appearance: Normal appearance.  Eyes:     Extraocular Movements: Extraocular movements intact.     Pupils: Pupils are equal, round, and reactive to light.  Cardiovascular:     Rate  and Rhythm: Normal rate and regular rhythm.  Pulmonary:     Effort: Pulmonary effort is normal.     Breath sounds: Normal breath sounds.  Musculoskeletal:     Comments: Only trace nonpitting edema noted lower legs at this time.  Neurological:  Mental Status: He is alert.     BP 130/80 (BP Location: Left Arm, Patient Position: Sitting, Cuff Size: Normal)   Pulse 82   Temp 98.3 F (36.8 C) (Oral)   Wt (!) 329 lb 8 oz (149.5 kg)   SpO2 98%   BMI 41.74 kg/m  Wt Readings from Last 3 Encounters:  02/22/21 (!) 329 lb 8 oz (149.5 kg)  01/11/21 (!) 338 lb (153.3 kg)  12/28/20 (!) 338 lb (153.3 kg)     Health Maintenance Due  Topic Date Due  . COVID-19 Vaccine (1) Never done  . HIV Screening  Never done  . Hepatitis C Screening  Never done    There are no preventive care reminders to display for this patient.  Lab Results  Component Value Date   TSH 2.21 02/17/2020   Lab Results  Component Value Date   WBC 6.5 01/21/2021   HGB 14.7 01/21/2021   HCT 45.5 01/21/2021   MCV 89.0 01/21/2021   PLT 305 01/21/2021   Lab Results  Component Value Date   NA 137 12/02/2020   K 3.7 12/02/2020   CO2 25 12/02/2020   GLUCOSE 103 (H) 12/02/2020   BUN 13 12/02/2020   CREATININE 1.26 (H) 12/02/2020   BILITOT 0.6 12/02/2020   ALKPHOS 72 12/02/2020   AST 20 12/02/2020   ALT 22 12/02/2020   PROT 7.4 12/02/2020   ALBUMIN 3.8 12/02/2020   CALCIUM 8.8 (L) 12/02/2020   ANIONGAP 9 12/02/2020   GFR 62.26 11/03/2020   Lab Results  Component Value Date   CHOL 128 02/17/2020   Lab Results  Component Value Date   HDL 33.30 (L) 02/17/2020   Lab Results  Component Value Date   LDLCALC 80 02/17/2020   Lab Results  Component Value Date   TRIG 76.0 02/17/2020   Lab Results  Component Value Date   CHOLHDL 4 02/17/2020   Lab Results  Component Value Date   HGBA1C 5.5 02/17/2020      Assessment & Plan:   #1 bilateral leg edema.  This occurs late in the day.  Suspect  predominantly venous stasis.  Doubt heart failure. -Check further labs with basic metabolic panel, albumin, TSH, spot microalbumin creatinine ratio -Elevate legs frequently -Consider daily use of knee-high compression socks especially while at work -Avoid thiazides with past history of gout -Prefer to avoid any diuretics at this point unless symptoms worsen  #2 bilateral eye pain.  Previous eye exams reportedly normal.  Also does complain of some mild photophobia at times. -Recommend setting up referral to ophthalmologist for further evaluation and he agrees  #3 recent elevated C-reactive protein probably related to acute gout flare.  We will recheck C-reactive protein level.  No orders of the defined types were placed in this encounter.   Follow-up: No follow-ups on file.    Carolann Littler, MD

## 2021-02-22 NOTE — Patient Instructions (Signed)
Peripheral Edema  Peripheral edema is swelling that is caused by a buildup of fluid. Peripheral edema most often affects the lower legs, ankles, and feet. It can also develop in the arms, hands, and face. The area of the body that has peripheral edema will look swollen. It may also feel heavy or warm. Your clothes may start to feel tight. Pressing on the area may make a temporary dent in your skin. You may not be able to move your swollen arm or leg as much as usual. There are many causes of peripheral edema. It can happen because of a complication of other conditions such as congestive heart failure, kidney disease, or a problem with your blood circulation. It also can be a side effect of certain medicines or because of an infection. It often happens to women during pregnancy. Sometimes, the cause is not known. Follow these instructions at home: Managing pain, stiffness, and swelling  Raise (elevate) your legs while you are sitting or lying down.  Move around often to prevent stiffness and to lessen swelling.  Do not sit or stand for long periods of time.  Wear support stockings as told by your health care provider.   Medicines  Take over-the-counter and prescription medicines only as told by your health care provider.  Your health care provider may prescribe medicine to help your body get rid of excess water (diuretic). General instructions  Pay attention to any changes in your symptoms.  Follow instructions from your health care provider about limiting salt (sodium) in your diet. Sometimes, eating less salt may reduce swelling.  Moisturize skin daily to help prevent skin from cracking and draining.  Keep all follow-up visits as told by your health care provider. This is important. Contact a health care provider if you have:  A fever.  Edema that starts suddenly or is getting worse, especially if you are pregnant or have a medical condition.  Swelling in only one leg.  Increased  swelling, redness, or pain in one or both of your legs.  Drainage or sores at the area where you have edema. Get help right away if you:  Develop shortness of breath, especially when you are lying down.  Have pain in your chest or abdomen.  Feel weak.  Feel faint. Summary  Peripheral edema is swelling that is caused by a buildup of fluid. Peripheral edema most often affects the lower legs, ankles, and feet.  Move around often to prevent stiffness and to lessen swelling. Do not sit or stand for long periods of time.  Pay attention to any changes in your symptoms.  Contact a health care provider if you have edema that starts suddenly or is getting worse, especially if you are pregnant or have a medical condition.  Get help right away if you develop shortness of breath, especially when lying down. This information is not intended to replace advice given to you by your health care provider. Make sure you discuss any questions you have with your health care provider. Document Revised: 06/20/2018 Document Reviewed: 06/20/2018 Elsevier Patient Education  2021 [employer].

## 2021-02-24 ENCOUNTER — Encounter (HOSPITAL_BASED_OUTPATIENT_CLINIC_OR_DEPARTMENT_OTHER): Payer: Self-pay

## 2021-02-24 ENCOUNTER — Emergency Department (HOSPITAL_BASED_OUTPATIENT_CLINIC_OR_DEPARTMENT_OTHER): Payer: Managed Care, Other (non HMO)

## 2021-02-24 ENCOUNTER — Other Ambulatory Visit: Payer: Self-pay

## 2021-02-24 ENCOUNTER — Emergency Department (HOSPITAL_BASED_OUTPATIENT_CLINIC_OR_DEPARTMENT_OTHER)
Admission: EM | Admit: 2021-02-24 | Discharge: 2021-02-24 | Disposition: A | Discharge status: Home / Self Care | Payer: Managed Care, Other (non HMO) | Attending: Emergency Medicine | Admitting: Emergency Medicine

## 2021-02-24 ENCOUNTER — Ambulatory Visit: Payer: Managed Care, Other (non HMO) | Admitting: Podiatry

## 2021-02-24 DIAGNOSIS — R0789 Other chest pain: Secondary | ICD-10-CM | POA: Diagnosis not present

## 2021-02-24 DIAGNOSIS — H5713 Ocular pain, bilateral: Secondary | ICD-10-CM | POA: Insufficient documentation

## 2021-02-24 DIAGNOSIS — R079 Chest pain, unspecified: Secondary | ICD-10-CM

## 2021-02-24 LAB — CBC
HCT: 48.3 % (ref 39.0–52.0)
Hemoglobin: 16 g/dL (ref 13.0–17.0)
MCH: 29.6 pg (ref 26.0–34.0)
MCHC: 33.1 g/dL (ref 30.0–36.0)
MCV: 89.3 fL (ref 80.0–100.0)
Platelets: 233 10*3/uL (ref 150–400)
RBC: 5.41 MIL/uL (ref 4.22–5.81)
RDW: 14.3 % (ref 11.5–15.5)
WBC: 7.9 10*3/uL (ref 4.0–10.5)
nRBC: 0 % (ref 0.0–0.2)

## 2021-02-24 LAB — BASIC METABOLIC PANEL
Anion gap: 11 (ref 5–15)
BUN: 21 mg/dL — ABNORMAL HIGH (ref 6–20)
CO2: 23 mmol/L (ref 22–32)
Calcium: 9.2 mg/dL (ref 8.9–10.3)
Chloride: 102 mmol/L (ref 98–111)
Creatinine, Ser: 1.47 mg/dL — ABNORMAL HIGH (ref 0.61–1.24)
GFR, Estimated: 60 mL/min — ABNORMAL LOW (ref >60)
Glucose, Bld: 101 mg/dL — ABNORMAL HIGH (ref 70–99)
Potassium: 4.4 mmol/L (ref 3.5–5.1)
Sodium: 136 mmol/L (ref 135–145)

## 2021-02-24 LAB — TROPONIN I (HIGH SENSITIVITY): Troponin I (High Sensitivity): <2 ng/L (ref <18)

## 2021-02-24 MED ORDER — OXYCODONE-ACETAMINOPHEN 5-325 MG PO TABS
1.0000 | ORAL_TABLET | Freq: Once | ORAL | Status: DC
  Filled 2021-02-24: qty 1

## 2021-02-24 MED ORDER — ONDANSETRON 4 MG PO TBDP
4.0000 mg | ORAL_TABLET | Freq: Once | ORAL | Status: AC
  Administered 2021-02-24: 4 mg via ORAL
  Filled 2021-02-24: qty 1

## 2021-02-24 MED ORDER — ACETAMINOPHEN 325 MG PO TABS
650.0000 mg | ORAL_TABLET | Freq: Once | ORAL | Status: AC
  Administered 2021-02-24: 650 mg via ORAL
  Filled 2021-02-24: qty 2

## 2021-02-24 MED ORDER — FLUORESCEIN SODIUM 1 MG OP STRP
ORAL_STRIP | OPHTHALMIC | Status: AC
  Filled 2021-02-24: qty 1

## 2021-02-24 MED ORDER — TETRACAINE HCL 0.5 % OP SOLN
2.0000 [drp] | Freq: Once | OPHTHALMIC | Status: AC
  Administered 2021-02-24: 2 [drp] via OPHTHALMIC
  Filled 2021-02-24: qty 4

## 2021-02-24 MED ORDER — ONDANSETRON HCL 4 MG PO TABS: 4.0000 mg | ORAL_TABLET | Freq: Three times a day (TID) | ORAL | 0 refills | Status: DC | PRN

## 2021-02-24 NOTE — ED Triage Notes (Incomplete)
Started feeling bad yesterday at work.  N/V/D since 6a.m. today. Foul smelling stools.   Indigestion with frequent belching, chest pain, back pain.  HA with eyes and sinuses hurting.

## 2021-02-24 NOTE — ED Provider Notes (Signed)
[facility] EMERGENCY DEPARTMENT Provider Note   CSN: [id number] Arrival date & time: 02/24/21  1216     History Chief Complaint  Patient presents with  . Chest Pain    Francis Lin is a 45 y.o. male.  Presents to ER with multiple complaints.  He states that the chief complaint today is chest pain.  He has been noticing the pain over the past couple days.  Described as general discomfort, currently mild, central, nonradiating.  No alleviating or aggravating factors.  Not associated with exertion.  Also states that he is having eye pain.  States that this has been ongoing for many months.  Reports that he had discussed this with his primary care doctor who had recommended he follow-up with an eye doctor.  States that he has an appointment in July.  No change in his vision, states feels a pressure/bulging in both of his eyes.  No acute change today.  HPI     Past Medical History:  Diagnosis Date  . Acid reflux   . Allergy   . Anxiety    stress   . Clotting disorder (HCC)    right leg x 2 in calf and behind knee   . DVT (deep venous thrombosis) ([place])   . Esophagitis   . Gout   . Hypersomnia, persistent   . IBS (irritable bowel syndrome)   . PONV (postoperative nausea and vomiting)    nausea with no vomiting   . Shifting sleep-work schedule, affecting sleep 06/20/2013  . Snoring disorder 06/20/2013    Patient Active Problem List   Diagnosis Date Noted  . LUQ abdominal pain 11/03/2020  . Generalized abdominal pain 11/03/2020  . Altered bowel habits 11/03/2020  . Nausea without vomiting 11/03/2020  . Migraine without aura and without status migrainosus, not intractable 11/22/2018  . Nonsustained ventricular tachycardia ([place]) 08/03/2018  . Elevated serum creatinine 06/18/2018  . Slow heart rate 06/06/2018  . Shortness of breath 04/04/2018  . Venous insufficiency 01/05/2017  . Deep vein thrombosis (DVT) of popliteal vein of right lower extremity ([place])  06/30/2016  . S/P ORIF (open reduction internal fixation) fracture 06/22/2016  . Multiple fractures of both lower extremities 05/31/2016  . Elevated CK 05/17/2016  . Fracture of right fibula 05/17/2016  . Fracture of right tibia 05/17/2016  . Fracture of tibia, left, closed 05/17/2016  . Trauma 05/17/2016  . Situational anxiety 08/28/2014  . Unspecified vitamin D deficiency 09/22/2013  . Shifting sleep-work schedule, affecting sleep 06/20/2013  . Snoring disorder 06/20/2013  . Hypersomnia, persistent   . History of esophageal stricture 04/16/2013  . Pain in joint, ankle and foot 04/16/2013  . Gout 04/16/2013  . 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 OBESITY, CLASS II, BMI 35- 39.9 03/15/2012  . Hypertriglyceridemia 03/15/2012  . Atypical chest pain 12/02/2008  . Dysphagia 12/02/2008    Past Surgical History:  Procedure Laterality Date  . COLONOSCOPY    . HEMORRHOID BANDING    . ORIF TIBIA & FIBULA FRACTURES Right    right leg surgery x 2 places- rod in place knee to ankle   . UPPER GASTROINTESTINAL ENDOSCOPY    . wisdom teeth    . wrist surgery Left        Family History  Problem Relation Age of Onset  . Lupus Mother   . Heart attack Father   . Arthritis Father   . Hypertension Father   . Cancer Father        multiple  myeloma  . Diabetes Father   . Edema Sister   . Diabetes Sister   . Bipolar disorder Sister   . Colon cancer Neg Hx   . Colon polyps Neg Hx   . Esophageal cancer Neg Hx   . Rectal cancer Neg Hx   . Stomach cancer Neg Hx     Social History   Tobacco Use  . Smoking status: Never Smoker  . Smokeless tobacco: Never Used  Vaping Use  . Vaping Use: Never used  Substance Use Topics  . Alcohol use: No  . Drug use: No    Home Medications Prior to Admission medications   Medication Sig Start Date End Date Taking? Authorizing Provider  ondansetron (ZOFRAN) 4 MG tablet Take 1 tablet (4 mg total) by mouth every 8 (eight) hours as needed. 02/24/21   Yes Tegeler, Gwenyth Allegra, MD  acetaminophen (TYLENOL) 325 MG tablet Take 650 mg by mouth every 6 (six) hours as needed for mild pain.    [provider]  aspirin EC 81 MG tablet Take 81 mg by mouth every 6 (six) hours as needed for mild pain.    [provider]  colchicine 0.6 MG tablet Take by mouth as needed. 12/24/18   [provider]  fluticasone (FLONASE) 50 MCG/ACT nasal spray Place 1 spray into both nostrils daily as needed for allergies or rhinitis.    [provider]  ibuprofen (ADVIL) 200 MG tablet Take 200 mg by mouth every 6 (six) hours as needed for fever or mild pain.    [provider]  loratadine (CLARITIN) 10 MG tablet Take 10 mg by mouth as needed for allergies.    [provider]  meloxicam (MOBIC) 15 MG tablet Take 15 mg by mouth daily as needed for pain.  02/12/20   [provider]  pantoprazole (PROTONIX) 40 MG tablet Take 1 tablet (40 mg total) by mouth daily. 02/03/21   Pyrtle, Lajuan Lines, MD  cimetidine (TAGAMET) 200 MG tablet Take by mouth. 01/30/19 01/16/20  [provider]    Allergies    Lactose, Caffeine, Omeprazole, Peanuts [peanut oil], Penicillins, and Seasonal ic [cholestatin]  Review of Systems   Review of Systems  Constitutional: Negative for chills and fever.  HENT: Negative for ear pain and sore throat.   Eyes: Positive for pain. Negative for visual disturbance.  Respiratory: Negative for cough and shortness of breath.   Cardiovascular: Positive for chest pain. Negative for palpitations.  Gastrointestinal: Negative for abdominal pain and vomiting.  Genitourinary: Negative for dysuria and hematuria.  Musculoskeletal: Negative for arthralgias and back pain.  Skin: Negative for color change and rash.  Neurological: Negative for seizures and syncope.  All other systems reviewed and are negative.   Physical Exam Updated Vital Signs BP 130/86   Pulse 76   Temp 98.5 F (36.9 C) (Oral)    Resp (!) 21   Ht '6\' 2"'  (1.88 m)   Wt (!) 145.2 kg   SpO2 100%   BMI 41.09 kg/m   Physical Exam Vitals and nursing note reviewed.  Constitutional:      Appearance: He is well-developed.  HENT:     Head: Normocephalic and atraumatic.  Eyes:     General: Lids are normal. Vision grossly intact.        Right eye: No foreign body or discharge.        Left eye: No foreign body or discharge.     Intraocular pressure: Right eye pressure is  24 mmHg. Left eye pressure is 22 mmHg. Measurements were taken using a handheld tonometer.    Extraocular Movements:     Right eye: Normal extraocular motion.     Left eye: Normal extraocular motion.     Conjunctiva/sclera: Conjunctivae normal.     Right eye: Right conjunctiva is not injected. No chemosis or exudate.    Left eye: Left conjunctiva is not injected. No chemosis or exudate.    Pupils: Pupils are equal, round, and reactive to light.     Right eye: Pupil is reactive.     Left eye: Pupil is reactive.  Cardiovascular:     Rate and Rhythm: Normal rate and regular rhythm.     Heart sounds: No murmur heard.   Pulmonary:     Effort: Pulmonary effort is normal. No respiratory distress.     Breath sounds: Normal breath sounds.  Abdominal:     Palpations: Abdomen is soft.     Tenderness: There is no abdominal tenderness.  Musculoskeletal:     Cervical back: Neck supple.  Skin:    General: Skin is warm and dry.  Neurological:     Mental Status: He is alert.     ED Results / Procedures / Treatments   Labs (all labs ordered are listed, but only abnormal results are displayed) Labs Reviewed  BASIC METABOLIC PANEL - Abnormal; Notable for the following components:      Result Value   Glucose, Bld 101 (*)    BUN 21 (*)    Creatinine, Ser 1.47 (*)    GFR, Estimated 60 (*)    All other components within normal limits  CBC  TROPONIN I (HIGH SENSITIVITY)    EKG EKG Interpretation  Date/Time:  Wednesday Feb 24 2021 12:24:31  EDT Ventricular Rate:  79 PR Interval:  146 QRS Duration: 94 QT Interval:  366 QTC Calculation: 419 R Axis:   82 Text Interpretation: Normal sinus rhythm Normal ECG Confirmed by Madalyn Rob [phone]) on 02/24/2021 12:51:08 PM   Radiology DG Chest 2 View  Result Date: 02/24/2021 CLINICAL DATA:  Chest pain. EXAM: CHEST - 2 VIEW COMPARISON:  03/22/2020 FINDINGS: The cardiac silhouette, mediastinal and hilar contours are within normal limits. The lungs are clear. No pleural effusions. No pulmonary lesions. The bony thorax is intact. IMPRESSION: No acute cardiopulmonary findings. Electronically Signed   By: Marijo Sanes M.D.   On: 02/24/2021 13:26    Procedures Procedures   Medications Ordered in ED Medications  tetracaine (PONTOCAINE) 0.5 % ophthalmic solution 2 drop (2 drops Both Eyes Given 02/24/21 1343)  acetaminophen (TYLENOL) tablet 650 mg (650 mg Oral Given 02/24/21 1409)  ondansetron (ZOFRAN-ODT) disintegrating tablet 4 mg (4 mg Oral Given 02/24/21 1409)    ED Course  I have reviewed the triage vital signs and the nursing notes.  Pertinent labs & imaging results that were available during my care of the patient were reviewed by me and considered in my medical decision making (see chart for details).    MDM Rules/Calculators/A&P                         45 year old male presenting to ER with multiple concerns.  Reports that primary new symptom today is chest pain.  Ongoing for the past couple days.  On exam he appears well and in no distress.  His vital signs are stable.  His EKG does not have any ischemic changes troponin is within normal limits.  Doubt  ACS.  His chest x-ray is negative.  No pneumonia or pneumothorax.  Given no tachypnea, hypoxia, tachycardia, doubt pulmonary embolism.  Regarding his eye discomfort, ongoing for many weeks, no acute change today.  Pupils are briskly reactive and equal.  Eyes appear grossly normal.  IOP at upper limit of normal but not grossly  abnormal.  Denies vision change.  Given these findings, recommend he follow-up with an ophthalmologist but do not feel he needs further emergent work-up today.  Additionally recommend he follow-up with his primary care doctor.  Discharged home.    After the discussed management above, the patient was determined to be safe for discharge.  The patient was in agreement with this plan and all questions regarding their care were answered.  ED return precautions were discussed and the patient will return to the ED with any significant worsening of condition.    Final Clinical Impression(s) / ED Diagnoses Final diagnoses:  Eye pain, bilateral  Chest pain, unspecified type    Rx / DC Orders ED Discharge Orders         Ordered    ondansetron (ZOFRAN) 4 MG tablet  Every 8 hours PRN        02/24/21 1532           Lucrezia Starch, MD 02/25/21 [phone]

## 2021-02-24 NOTE — Discharge Instructions (Signed)
Please follow-up with your primary care doctor as well as with the ophthalmologist.  Return to the emergency room if you develop vision loss, worsening chest pain, difficulty breathing or other new concerning symptom.

## 2021-02-24 NOTE — ED Notes (Signed)
Dr D in room with pt now. 

## 2021-02-24 NOTE — ED Notes (Signed)
Monday patient was weighed at the dr office and states weight was 329lbs.

## 2021-02-26 ENCOUNTER — Telehealth: Payer: Self-pay | Admitting: Family Medicine

## 2021-02-26 NOTE — Telephone Encounter (Signed)
FYI:  Pt is calling in needing a new referral to a eye doctor stated that the one that he was sent to does not accept his insurance.  Pt is going to call his insurance and find out what doctor is in network and give [place] a call back.

## 2021-03-01 NOTE — Telephone Encounter (Signed)
Noted.  Will wait to hear back from patient.

## 2021-03-29 ENCOUNTER — Encounter: Payer: Self-pay | Admitting: *Deleted

## 2021-04-07 ENCOUNTER — Ambulatory Visit (INDEPENDENT_AMBULATORY_CARE_PROVIDER_SITE_OTHER): Payer: Managed Care, Other (non HMO) | Admitting: Family Medicine

## 2021-04-07 ENCOUNTER — Other Ambulatory Visit: Payer: Self-pay

## 2021-04-07 ENCOUNTER — Encounter: Payer: Self-pay | Admitting: Family Medicine

## 2021-04-07 VITALS — BP 118/70 | HR 75 | Temp 98.1°F | Wt 332.1 lb

## 2021-04-07 DIAGNOSIS — H6123 Impacted cerumen, bilateral: Secondary | ICD-10-CM

## 2021-04-07 DIAGNOSIS — J302 Other seasonal allergic rhinitis: Secondary | ICD-10-CM | POA: Diagnosis not present

## 2021-04-07 MED ORDER — MOMETASONE FUROATE 50 MCG/ACT NA SUSP: 2.0000 | Freq: Every day | NASAL | 12 refills | Status: DC

## 2021-04-07 NOTE — Patient Instructions (Signed)
Continue with Claritin, Zyrtec, or Allegra in addition to Nasonex for allergy symptoms  Consider over-the-counter regimen of Cerumenex or Debrox followed by gentle irrigation with lukewarm water with rubber bulb syringe for future buildup of wax

## 2021-04-07 NOTE — Progress Notes (Signed)
Established Patient Office Visit  Subjective:  Patient ID: Francis Lin, male    DOB: 07/23/1976  Age: 45 y.o. MRN: [id number]  CC:  Chief Complaint  Patient presents with   Ear Pain    Both ears, x 2 weeks, pressure, hearing is muffled, ears painful and itching    HPI Francis Lin presents for 2-week history of ears feeling "muffled "with some discomfort and some itching.  He does have frequent sinus congestive symptoms and allergies sometimes improved with Claritin.  Does not take this regularly.  Has used Flonase in the past.  Apparently his wife is used Nasonex and is requesting Nasonex trial.  No recent fever.  No significant facial pain.  No purulent nasal secretions.  Past Medical History:  Diagnosis Date   Acid reflux    Allergy    Anxiety    stress    Clotting disorder (HCC)    right leg x 2 in calf and behind knee    DVT (deep venous thrombosis) (HCC)    Esophagitis    Gout    Hypersomnia, persistent    IBS (irritable bowel syndrome)    PONV (postoperative nausea and vomiting)    nausea with no vomiting    Shifting sleep-work schedule, affecting sleep 06/20/2013   Snoring disorder 06/20/2013   Tubular adenoma of colon     Past Surgical History:  Procedure Laterality Date   COLONOSCOPY     HEMORRHOID BANDING     ORIF TIBIA & FIBULA FRACTURES Right    right leg surgery x 2 places- rod in place knee to ankle    UPPER GASTROINTESTINAL ENDOSCOPY     wisdom teeth     wrist surgery Left     Family History  Problem Relation Age of Onset   Lupus Mother    Heart attack Father    Arthritis Father    Hypertension Father    Cancer Father        multiple myeloma   Diabetes Father    Edema Sister    Diabetes Sister    Bipolar disorder Sister    Colon cancer Neg Hx    Colon polyps Neg Hx    Esophageal cancer Neg Hx    Rectal cancer Neg Hx    Stomach cancer Neg Hx     Social History   Socioeconomic History   Marital status: Married    Spouse  name: Not on file   Number of children: 3   Years of education: college   Highest education level: Not on file  Occupational History    Employer: [employer]    Comment: [employer]  Tobacco Use   Smoking status: Never   Smokeless tobacco: Never  Vaping Use   Vaping Use: Never used  Substance and Sexual Activity   Alcohol use: No   Drug use: No   Sexual activity: Not on file  Other Topics Concern   Not on file  Social History Narrative   Not on file   Social Determinants of Health   Financial Resource Strain: Not on file  Food Insecurity: Not on file  Transportation Needs: Not on file  Physical Activity: Not on file  Stress: Not on file  Social Connections: Not on file  Intimate Partner Violence: Not on file    Outpatient Medications Prior to Visit  Medication Sig Dispense Refill   acetaminophen (TYLENOL) 325 MG tablet Take 650 mg by mouth every 6 (six) hours as needed for  mild pain.     aspirin EC 81 MG tablet Take 81 mg by mouth every 6 (six) hours as needed for mild pain.     colchicine 0.6 MG tablet Take by mouth as needed.     ibuprofen (ADVIL) 200 MG tablet Take 200 mg by mouth every 6 (six) hours as needed for fever or mild pain.     loratadine (CLARITIN) 10 MG tablet Take 10 mg by mouth as needed for allergies.     meloxicam (MOBIC) 15 MG tablet Take 15 mg by mouth daily as needed for pain.      ondansetron (ZOFRAN) 4 MG tablet Take 1 tablet (4 mg total) by mouth every 8 (eight) hours as needed. 12 tablet 0   pantoprazole (PROTONIX) 40 MG tablet Take 1 tablet (40 mg total) by mouth daily. 90 tablet 3   fluticasone (FLONASE) 50 MCG/ACT nasal spray Place 1 spray into both nostrils daily as needed for allergies or rhinitis.     Facility-Administered Medications Prior to Visit  Medication Dose Route Frequency Provider Last Rate Last Admin   triamcinolone acetonide (KENALOG) 10 MG/ML injection 10 mg  10 mg Other Once Regal, Norman S, DPM        Allergies   Allergen Reactions   Lactose Diarrhea and Other (See Comments)    Lactose intolerance Lactose intolerance Lactose intolerance   Caffeine Other (See Comments)    "makes feel bad", headaches   Omeprazole Other (See Comments)    Mood swing   Peanuts [Peanut Oil]    Penicillins Other (See Comments)    Headaches    Seasonal Ic [Cholestatin]     Sneezing,runny nose , itchy eyes    ROS Review of Systems  Constitutional:  Negative for chills and fever.  HENT:  Positive for ear pain and hearing loss. Negative for congestion and ear discharge.   Respiratory:  Negative for cough and shortness of breath.   Neurological:  Negative for headaches.     Objective:    Physical Exam Vitals reviewed.  Constitutional:      Appearance: Normal appearance.  HENT:     Ears:     Comments: Cerumen impaction bilaterally Cardiovascular:     Rate and Rhythm: Normal rate and regular rhythm.  Neurological:     Mental Status: He is alert.    BP 118/70 (BP Location: Left Arm, Patient Position: Sitting, Cuff Size: Normal)   Pulse 75   Temp 98.1 F (36.7 C) (Oral)   Wt (!) 332 lb 1.6 oz (150.6 kg)   SpO2 99%   BMI 42.64 kg/m  Wt Readings from Last 3 Encounters:  04/07/21 (!) 332 lb 1.6 oz (150.6 kg)  02/24/21 (!) 320 lb (145.2 kg)  02/22/21 (!) 329 lb 8 oz (149.5 kg)     Health Maintenance Due  Topic Date Due   COVID-19 Vaccine (1) Never done   HIV Screening  Never done   Hepatitis C Screening  Never done    There are no preventive care reminders to display for this patient.  Lab Results  Component Value Date   TSH 2.24 02/22/2021   Lab Results  Component Value Date   WBC 7.9 02/24/2021   HGB 16.0 02/24/2021   HCT 48.3 02/24/2021   MCV 89.3 02/24/2021   PLT 233 02/24/2021   Lab Results  Component Value Date   NA 136 02/24/2021   K 4.4 02/24/2021   CO2 23 02/24/2021   GLUCOSE 101 (H) 02/24/2021  BUN 21 (H) 02/24/2021   CREATININE 1.47 (H) 02/24/2021   BILITOT 0.6  02/22/2021   ALKPHOS 76 02/22/2021   AST 13 02/22/2021   ALT 17 02/22/2021   PROT 6.9 02/22/2021   ALBUMIN 4.0 02/22/2021   CALCIUM 9.2 02/24/2021   ANIONGAP 11 02/24/2021   GFR 53.22 (L) 02/22/2021   Lab Results  Component Value Date   CHOL 128 02/17/2020   Lab Results  Component Value Date   HDL 33.30 (L) 02/17/2020   Lab Results  Component Value Date   LDLCALC 80 02/17/2020   Lab Results  Component Value Date   TRIG 76.0 02/17/2020   Lab Results  Component Value Date   CHOLHDL 4 02/17/2020   Lab Results  Component Value Date   HGBA1C 5.8 02/22/2021      Assessment & Plan:   #1 bilateral cerumen impaction.  Discussed risk of irrigation with pain, bleeding, low risk of perforation. Pt consents.  -Irrigation of both ears with removal of cerumen.  Patient tolerated well.   #2 allergic rhinitis, seasonal  -Continue over-the-counter antihistamine such as Claritin or Allegra -Sent in prescription for Nasonex to do 2 sprays per nostril once daily as needed  Meds ordered this encounter  Medications   mometasone (NASONEX) 50 MCG/ACT nasal spray    Sig: Place 2 sprays into the nose daily.    Dispense:  1 each    Refill:  12    Follow-up: No follow-ups on file.    Carolann Littler, MD

## 2021-04-16 ENCOUNTER — Encounter: Payer: Self-pay | Admitting: Internal Medicine

## 2021-04-16 ENCOUNTER — Ambulatory Visit (INDEPENDENT_AMBULATORY_CARE_PROVIDER_SITE_OTHER): Payer: Managed Care, Other (non HMO) | Admitting: Internal Medicine

## 2021-04-16 VITALS — BP 128/74 | HR 73 | Ht 75.0 in | Wt 332.0 lb

## 2021-04-16 DIAGNOSIS — R1310 Dysphagia, unspecified: Secondary | ICD-10-CM

## 2021-04-16 DIAGNOSIS — K219 Gastro-esophageal reflux disease without esophagitis: Secondary | ICD-10-CM | POA: Diagnosis not present

## 2021-04-16 DIAGNOSIS — K6289 Other specified diseases of anus and rectum: Secondary | ICD-10-CM | POA: Diagnosis not present

## 2021-04-16 DIAGNOSIS — K625 Hemorrhage of anus and rectum: Secondary | ICD-10-CM

## 2021-04-16 DIAGNOSIS — K648 Other hemorrhoids: Secondary | ICD-10-CM

## 2021-04-16 MED ORDER — RABEPRAZOLE SODIUM 20 MG PO TBEC: 20.0000 mg | DELAYED_RELEASE_TABLET | Freq: Every day | ORAL | 2 refills | Status: DC

## 2021-04-16 NOTE — Progress Notes (Signed)
Patient ID: Francis Lin, male   DOB: Aug 26, 1976, 45 y.o.   MRN: [id number] HPI: Francis Lin is a 45 year old male with a past medical history of adenomatous colon polyps, hemorrhoids with prior band ligation, history of GERD, prior DVT, history of gout who is seen to discuss internal hemorrhoids, rectal bleeding and dysphagia symptom.  He is here today with his wife.  He is known to me from his colonoscopy which I performed on 01/11/2021 to evaluate and follow-up on abnormal CT scan of the GI tract.  CT scan in February 2022 showed thickening and a narrowed appearance of the terminal ileum as well as mild thickening in the left colon and rectum. Colonoscopy was performed on 01/11/2021.  Terminal ileum was normal.  2 polyps removed from the transverse colon ranged 4 to 6 mm in size.  A 10 mm pedunculated polyp was removed with hot snare from the sigmoid.  Internal hemorrhoids were seen on retroflexion as well as visible post hemorrhoidal banding scars in the distal rectum.  Pathology from these polyps revealed tubular adenomas without high-grade dysplasia.  He reports that he is still having intermittent red blood with wiping.  He feels perianal irritation and inflammation.  He has some pain at times with passing stool.  He has 2-4 bowel movements a day usually formed stool.  Denies constipation and diarrhea.  Does not know if there is blood in the toilet water because he does not inspect.  He has seen blood with wiping.  Separate from this he reports history of reflux and epigastric discomfort as well as some heartburn and solid food dysphagia.  He recalls prior upper endoscopy and he had his esophagus "stretched".  At times his epigastric pain radiates into his mid back.  I prescribed pantoprazole for complaint of heartburn after his colonoscopy but he reports he was scared to take it because omeprazole taken previously gave him considerable mood swings.  He recalls Aciphex having worked effectively in the  past but was cost prohibitive.  Past Medical History:  Diagnosis Date   Acid reflux    Allergy    Anxiety    stress    Clotting disorder (HCC)    right leg x 2 in calf and behind knee    DVT (deep venous thrombosis) (HCC)    Esophagitis    Gout    Hypersomnia, persistent    IBS (irritable bowel syndrome)    PONV (postoperative nausea and vomiting)    nausea with no vomiting    Shifting sleep-work schedule, affecting sleep 06/20/2013   Snoring disorder 06/20/2013   Tubular adenoma of colon     Past Surgical History:  Procedure Laterality Date   COLONOSCOPY     HEMORRHOID BANDING     ORIF TIBIA & FIBULA FRACTURES Right    right leg surgery x 2 places- rod in place knee to ankle    UPPER GASTROINTESTINAL ENDOSCOPY     wisdom teeth     wrist surgery Left     Outpatient Medications Prior to Visit  Medication Sig Dispense Refill   acetaminophen (TYLENOL) 325 MG tablet Take 650 mg by mouth every 6 (six) hours as needed for mild pain.     aspirin EC 81 MG tablet Take 81 mg by mouth every 6 (six) hours as needed for mild pain.     colchicine 0.6 MG tablet Take by mouth as needed.     ibuprofen (ADVIL) 200 MG tablet Take 200 mg by mouth every 6 (  six) hours as needed for fever or mild pain.     loratadine (CLARITIN) 10 MG tablet Take 10 mg by mouth as needed for allergies.     meloxicam (MOBIC) 15 MG tablet Take 15 mg by mouth daily as needed for pain.      mometasone (NASONEX) 50 MCG/ACT nasal spray Place 2 sprays into the nose daily. 1 each 12   ondansetron (ZOFRAN) 4 MG tablet Take 1 tablet (4 mg total) by mouth every 8 (eight) hours as needed. 12 tablet 0   pantoprazole (PROTONIX) 40 MG tablet Take 1 tablet (40 mg total) by mouth daily. 90 tablet 3   triamcinolone acetonide (KENALOG) 10 MG/ML injection 10 mg      No facility-administered medications prior to visit.    Allergies  Allergen Reactions   Lactose Diarrhea and Other (See Comments)    Lactose intolerance Lactose  intolerance Lactose intolerance   Caffeine Other (See Comments)    "makes feel bad", headaches   Omeprazole Other (See Comments)    Mood swing   Peanuts [Peanut Oil]    Penicillins Other (See Comments)    Headaches    Seasonal Ic [Cholestatin]     Sneezing,runny nose , itchy eyes    Family History  Problem Relation Age of Onset   Lupus Mother    Heart attack Father    Arthritis Father    Hypertension Father    Cancer Father        multiple myeloma   Diabetes Father    Edema Sister    Diabetes Sister    Bipolar disorder Sister    Colon cancer Neg Hx    Colon polyps Neg Hx    Esophageal cancer Neg Hx    Rectal cancer Neg Hx    Stomach cancer Neg Hx     Social History   Tobacco Use   Smoking status: Never   Smokeless tobacco: Never  Vaping Use   Vaping Use: Never used  Substance Use Topics   Alcohol use: No   Drug use: No    ROS: As per history of present illness, otherwise negative  BP 128/74   Pulse 73   Ht '6\' 3"'  (1.905 m)   Wt (!) 332 lb (150.6 kg)   BMI 41.50 kg/m  Constitutional: Well-developed and well-nourished. No distress. HEENT: Normocephalic and atraumatic.  No scleral icterus. Cardiovascular: Normal rate, regular rhythm and intact distal pulses. No M/R/G Pulmonary/chest: Effort normal and breath sounds normal. No wheezing, rales or rhonchi. Abdominal: Soft, nontender, nondistended. Bowel sounds active throughout.  Extremities: no clubbing, cyanosis, or edema Neurological: Alert and oriented to person place and time. Skin: Skin is warm and dry.  Psychiatric: Normal mood and affect. Behavior is normal.  RELEVANT LABS AND IMAGING: CBC    Component Value Date/Time   WBC 7.9 02/24/2021 1242   RBC 5.41 02/24/2021 1242   HGB 16.0 02/24/2021 1242   HCT 48.3 02/24/2021 1242   PLT 233 02/24/2021 1242   MCV 89.3 02/24/2021 1242   MCV 95.5 05/05/2013 1614   MCH 29.6 02/24/2021 1242   MCHC 33.1 02/24/2021 1242   RDW 14.3 02/24/2021 1242    LYMPHSABS 2,009 01/21/2021 0000   MONOABS 0.7 12/02/2020 1839   EOSABS 644 (H) 01/21/2021 0000   BASOSABS 72 01/21/2021 0000    CMP     Component Value Date/Time   NA 136 02/24/2021 1242   K 4.4 02/24/2021 1242   CL 102 02/24/2021 1242   CO2  23 02/24/2021 1242   GLUCOSE 101 (H) 02/24/2021 1242   BUN 21 (H) 02/24/2021 1242   CREATININE 1.47 (H) 02/24/2021 1242   CREATININE 1.34 04/25/2014 1305   CALCIUM 9.2 02/24/2021 1242   PROT 6.9 02/22/2021 1014   PROT 6.6 04/13/2018 0000   ALBUMIN 4.0 02/22/2021 1014   ALBUMIN 3.8 04/13/2018 0000   AST 13 02/22/2021 1014   ALT 17 02/22/2021 1014   ALKPHOS 76 02/22/2021 1014   BILITOT 0.6 02/22/2021 1014   BILITOT 0.4 04/13/2018 0000   GFRNONAA 60 (L) 02/24/2021 1242   GFRAA 58 (L) 03/22/2020 1719   CT ABDOMEN AND PELVIS WITH CONTRAST   TECHNIQUE: Multidetector CT imaging of the abdomen and pelvis was performed using the standard protocol following bolus administration of intravenous contrast.   CONTRAST:  189m OMNIPAQUE IOHEXOL 300 MG/ML SOLN, additional oral enteric contrast   COMPARISON:  05/05/2013   FINDINGS: Lower chest: No acute abnormality.   Hepatobiliary: No solid liver abnormality is seen. No gallstones, gallbladder wall thickening, or biliary dilatation.   Pancreas: Unremarkable. No pancreatic ductal dilatation or surrounding inflammatory changes.   Spleen: Normal in size without significant abnormality.   Adrenals/Urinary Tract: Adrenal glands are unremarkable. Kidneys are normal, without renal calculi, solid lesion, or hydronephrosis. Bladder is unremarkable.   Stomach/Bowel: Stomach is within normal limits. Appendix appears normal. There is a thickened, narrowed appearance of the terminal ileum, involving approximately the distal 10 cm (series 5, image 46). The descending colon, sigmoid colon, and rectum, are decompressed, however appear mildly thickened.   Vascular/Lymphatic: No significant vascular  findings are present. Prominent subcentimeter lymph nodes in the central small bowel mesentery with mild associated fat stranding, unchanged in appearance compared to prior examination dated 2014 and consistent with benign sequelae of prior infection or inflammation.   Reproductive: No mass or other significant abnormality.   Other: No abdominal wall hernia or abnormality. No abdominopelvic ascites.   Musculoskeletal: No acute or significant osseous findings.   IMPRESSION: There is a thickened, narrowed appearance of the terminal ileum, involving approximately the distal 10 cm. The descending colon, sigmoid colon, and rectum, are decompressed, however appear mildly thickened. Findings suggest inflammatory bowel disease, particularly Crohn's disease given appearance and distribution. There is no overt evidence of obstruction or other complication on this non tailored examination.     Electronically Signed   By: AEddie CandleM.D.   On: 11/16/2020 09:47    ASSESSMENT/PLAN: 45year old male with a past medical history of adenomatous colon polyps, hemorrhoids with prior band ligation, history of GERD, prior DVT, history of gout who is seen to discuss internal hemorrhoids, rectal bleeding and dysphagia symptom.   Rectal bleeding/internal hemorrhoids --internal hemorrhoids were seen today at anoscopy but also recently at colonoscopy.  He has had prior hemorrhoidal banding but recalls this being performed while he was asleep for procedure.  We discussed his symptoms today and I cannot exclude fissure though he did not have visible fissure today.  I recommended that we band left lateral internal hemorrhoid which was the only significant hemorrhoid present today.  We reviewed the risk, benefits and alternatives and he is agreeable and wishes to proceed with banding.  If he continues to have red blood and perianal pain and I would recommend empiric treatment for fissure with topical  diltiazem.  ANOSCOPY: Using a disposable, lubricated, slotted, self-illuminating anoscope, the rectum was intubated without difficulty. The trochar was removed and the ano-rectum was circumferentially inspected.  Anal sphincter is quite long.  There were internal hemorrhoids, LL. There was no finding of an anorectal fissure. The rectal mucosa was not inflamed. No neoplasia or other pathology was identified. The inspection was well tolerated.    PROCEDURE NOTE:  The patient presents with symptomatic grade 2 internal hemorrhoids, requesting rubber band ligation of his hemorrhoidal disease.  All risks, benefits and alternative forms of therapy were described and informed consent was obtained.   The anorectum was pre-medicated with 0.125% nitroglycerin ointment The decision was made to band the LL internal hemorrhoid, and the [place] was used to perform band ligation without complication.   Digital anorectal examination was then performed to assure proper positioning of the band, and to adjust the banded tissue as required.  The patient was discharged home without pain or other issues.  Dietary and behavioral recommendations were given and along with follow-up instructions.    The patient will return as scheduled for follow-up and possible additional banding as required. No complications were encountered and the patient tolerated the procedure well.  2. GERD/dysphagia symptom --he has not been on antacid therapy recently because he was scared to take pantoprazole given his prior mood swings with omeprazole.  He has taken Aciphex in the past and so we will try this again.  If it is cost prohibitive he should use Pepcid 20 mg twice daily.  I recommended upper endoscopy given dysphagia for possible dilation.  We discussed the risk, benefits and alternatives and he is agreeable and wishes to proceed. --Aciphex 20 mg once daily; if cost prohibitive use famotidine 20 mg twice daily --Upper  endoscopy in the [place] with possible dilation  30 minutes total spent today including patient facing time, coordination of care, reviewing medical history/procedures/pertinent radiology studies, and documentation of the encounter.    GE:[id number], Alinda Sierras, Md [address] [place],  [place] [address]

## 2021-04-16 NOTE — Patient Instructions (Addendum)
We have sent the following medications to your pharmacy for you to pick up at your convenience: Aciphex 20 mg once daily (if this is cost prohibitive, you may purchase pepcid 20 mg to take twice daily over the counter)  You have been scheduled for an endoscopy. Please follow written instructions given to you at your visit today. If you use inhalers (even only as needed), please bring them with you on the day of your procedure.  If you are age 45 or younger, your body mass index should be between 19-25. Your Body mass index is 41.5 kg/m. If this is out of the aformentioned range listed, please consider follow up with your Primary Care Provider.   __________________________________________________________  The [facility] providers would like to encourage you to use [facility] to communicate with providers for non-urgent requests or questions.  Due to long hold times on the telephone, sending your provider a message by [facility] may be a faster and more efficient way to get a response.  Please allow 48 business hours for a response.  Please remember that this is for non-urgent requests.   Due to recent changes in healthcare laws, you may see the results of your imaging and laboratory studies on MyChart before your provider has had a chance to review them.  We understand that in some cases there may be results that are confusing or concerning to you. Not all laboratory results come back in the same time frame and the provider may be waiting for multiple results in order to interpret others.  Please give [place] 48 hours in order for your provider to thoroughly review all the results before contacting the office for clarification of your results.  ___________________________________________________  [facility] PROCEDURE    FOLLOW-UP CARE   The procedure you have had should have been relatively painless since the banding of the area involved does not have nerve endings and there is no pain sensation.   The rubber band cuts off the blood supply to the hemorrhoid and the band may fall off as soon as 48 hours after the banding (the band may occasionally be seen in the toilet bowl following a bowel movement). You may notice a temporary feeling of fullness in the rectum which should respond adequately to plain Tylenol or Motrin.  Following the banding, avoid strenuous exercise that evening and resume full activity the next day.  A sitz bath (soaking in a warm tub) or bidet is soothing, and can be useful for cleansing the area after bowel movements.     To avoid constipation, take two tablespoons of natural wheat bran, natural oat bran, flax, Benefiber or any over the counter fiber supplement and increase your water intake to 7-8 glasses daily.    Unless you have been prescribed anorectal medication, do not put anything inside your rectum for two weeks: No suppositories, enemas, fingers, etc.  Occasionally, you may have more bleeding than usual after the banding procedure.  This is often from the untreated hemorrhoids rather than the treated one.  Don't be concerned if there is a tablespoon or so of blood.  If there is more blood than this, lie flat with your bottom higher than your head and apply an ice pack to the area. If the bleeding does not stop within a half an hour or if you feel faint, call our office at [phone] or go to the emergency room.  Problems are not common; however, if there is a substantial amount of bleeding,  severe pain, chills, fever or difficulty passing urine (very rare) or other problems, you should call us at [phone] or report to the nearest emergency room.  Do not stay seated continuously for more than 2-3 hours for a day or two after the procedure.  Tighten your buttock muscles 10-15 times every two hours and take 10-15 deep breaths every 1-2 hours.  Do not spend more than a few minutes on the toilet if you cannot empty your bowel; instead re-visit the toilet at  a later time.

## 2021-04-29 ENCOUNTER — Other Ambulatory Visit: Payer: Self-pay

## 2021-04-29 ENCOUNTER — Telehealth (INDEPENDENT_AMBULATORY_CARE_PROVIDER_SITE_OTHER): Payer: Managed Care, Other (non HMO) | Admitting: Family Medicine

## 2021-04-29 ENCOUNTER — Ambulatory Visit
Admission: EM | Admit: 2021-04-29 | Discharge: 2021-04-29 | Disposition: A | Discharge status: Home / Self Care | Payer: Managed Care, Other (non HMO) | Attending: Physician Assistant | Admitting: Physician Assistant

## 2021-04-29 ENCOUNTER — Encounter: Payer: Self-pay | Admitting: Family Medicine

## 2021-04-29 VITALS — BP 157/91 | HR 81 | Temp 97.0°F | Wt 330.0 lb

## 2021-04-29 DIAGNOSIS — M5442 Lumbago with sciatica, left side: Secondary | ICD-10-CM

## 2021-04-29 DIAGNOSIS — M25572 Pain in left ankle and joints of left foot: Secondary | ICD-10-CM | POA: Diagnosis not present

## 2021-04-29 DIAGNOSIS — T148XXA Other injury of unspecified body region, initial encounter: Secondary | ICD-10-CM

## 2021-04-29 DIAGNOSIS — M79604 Pain in right leg: Secondary | ICD-10-CM

## 2021-04-29 DIAGNOSIS — M79605 Pain in left leg: Secondary | ICD-10-CM | POA: Diagnosis not present

## 2021-04-29 DIAGNOSIS — R61 Generalized hyperhidrosis: Secondary | ICD-10-CM

## 2021-04-29 DIAGNOSIS — R6883 Chills (without fever): Secondary | ICD-10-CM | POA: Diagnosis not present

## 2021-04-29 DIAGNOSIS — R609 Edema, unspecified: Secondary | ICD-10-CM

## 2021-04-29 MED ORDER — MUPIROCIN 2 % EX OINT: 1.0000 "application " | TOPICAL_OINTMENT | Freq: Every day | CUTANEOUS | 0 refills | Status: DC

## 2021-04-29 MED ORDER — TIZANIDINE HCL 4 MG PO CAPS: 4.0000 mg | ORAL_CAPSULE | Freq: Three times a day (TID) | ORAL | 0 refills | Status: DC | PRN

## 2021-04-29 MED ORDER — PREDNISONE 10 MG (21) PO TBPK: ORAL_TABLET | ORAL | 0 refills | Status: DC

## 2021-04-29 NOTE — ED Triage Notes (Signed)
Five days ago, [place] scraped the lateral aspect of his left shin. Two days ago, Pt reports an onset of low back pain that radiates into his left buttock extending into the lateral aspect of his left thigh and into the anterolateral calf before leading into the plantar surface of his foot. Pt c/o intermittent n/t in LLE with swelling in his left ankle.  Pt took some remaining prednisone that he had at home. Also took tylenol with some relief. Ambulating with crutches. No RLE sxs. No LOC with the fall.

## 2021-04-29 NOTE — ED Provider Notes (Signed)
[facility]    CSN: [id number] Arrival date & time: 04/29/21  1652      History   Chief Complaint Chief Complaint  Patient presents with   Back Pain    lumbar   Leg Pain    left   Leg Swelling    HPI Francis Lin is a 45 y.o. male.   Patient presents today with a several day history of left lower back and leg pain.  Reports that few days ago he was walking on the beach when he tripped and fell causing several abrasions on his left lateral leg.  He did not have pain for several days but a few days after this he developed severe pain in his left medial ankle, surrounding abrasions left lateral leg, left lower back with radiation throughout leg.  Pain is rated 10 when it began but is currently rated 7 on a 0-10 pain scale, described as shooting, worse with attempted ambulation or palpation, no alleviating factors identified.  He does have a history of gout and states current symptoms are similar to previous episodes of this condition.  He did take a few doses of prednisone as well as colchicine which has improved but not resolved symptoms.  He denies any fever, nausea, vomiting, chest pain, shortness of breath.  He is having difficulty ambulating as a result of pain and so has missed work.  He is concerned that he might have a blood clot as he has a history of DVT but states current symptoms are not similar to previous episodes of this condition.  He is not currently anticoagulated.  Denies any chest pain, shortness of breath, headache, dizziness, palpitations.   Past Medical History:  Diagnosis Date   Acid reflux    Allergy    Anxiety    stress    Clotting disorder ([place])    right leg x 2 in calf and behind knee    DVT (deep venous thrombosis) (HCC)    Esophagitis    Gout    Hypersomnia, persistent    IBS (irritable bowel syndrome)    PONV (postoperative nausea and vomiting)    nausea with no vomiting    Shifting sleep-work schedule, affecting sleep 06/20/2013    Snoring disorder 06/20/2013   Tubular adenoma of colon     Patient Active Problem List   Diagnosis Date Noted   LUQ abdominal pain 11/03/2020   Generalized abdominal pain 11/03/2020   Altered bowel habits 11/03/2020   Nausea without vomiting 11/03/2020   Migraine without aura and without status migrainosus, not intractable 11/22/2018   Nonsustained ventricular tachycardia ([place]) 08/03/2018   Elevated serum creatinine 06/18/2018   Slow heart rate 06/06/2018   Shortness of breath 04/04/2018   Venous insufficiency 01/05/2017   Deep vein thrombosis (DVT) of popliteal vein of right lower extremity ([place]) 06/30/2016   S/P ORIF (open reduction internal fixation) fracture 06/22/2016   Multiple fractures of both lower extremities 05/31/2016   Elevated CK 05/17/2016   Fracture of right fibula 05/17/2016   Fracture of right tibia 05/17/2016   Fracture of tibia, left, closed 05/17/2016   Trauma 05/17/2016   Situational anxiety 08/28/2014   Unspecified vitamin D deficiency 09/22/2013   Shifting sleep-work schedule, affecting sleep 06/20/2013   Snoring disorder 06/20/2013   Hypersomnia, persistent    History of esophageal stricture 04/16/2013   Pain in joint, ankle and foot 04/16/2013   Gout 04/16/2013   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 Authorizing Provider  mupirocin ointment (BACTROBAN) 2 % Apply 1 application topically daily. 04/29/21  Yes Raspet, Erin K, PA-C  predniSONE  (STERAPRED UNI-PAK 21 TAB) 10 MG (21) TBPK tablet As directed 04/29/21  Yes Raspet, Erin K, PA-C  tiZANidine (ZANAFLEX) 4 MG capsule Take 1 capsule (4 mg total) by mouth 3 (three) times daily as needed for muscle spasms. 04/29/21  Yes Raspet, Derry Skill, PA-C  acetaminophen (TYLENOL) 325 MG tablet Take 650 mg by mouth every 6 (six) hours as needed for mild pain.    [provider]  aspirin EC 81 MG tablet Take 81 mg by mouth every 6 (six) hours as needed for mild pain.    [provider]  colchicine 0.6 MG tablet Take by mouth as needed. 12/24/18   [provider]  ibuprofen (ADVIL) 200 MG tablet Take 200 mg by mouth every 6 (six) hours as needed for fever or mild pain.    [provider]  loratadine (CLARITIN) 10 MG tablet Take 10 mg by mouth as needed for allergies.    [provider]  meloxicam (MOBIC) 15 MG tablet Take 15 mg by mouth daily as needed for pain.  02/12/20   [provider]  mometasone (NASONEX) 50 MCG/ACT nasal spray Place 2 sprays into the nose daily. 04/07/21   Burchette, Alinda Sierras, MD  ondansetron (ZOFRAN) 4 MG tablet Take 1 tablet (4 mg total) by mouth every 8 (eight) hours as needed. 02/24/21   Tegeler, Gwenyth Allegra, MD  OVER THE COUNTER MEDICATION GoutFlex-as needed    [provider]  RABEprazole (ACIPHEX) 20 MG tablet Take 1 tablet (20 mg total) by mouth daily. 04/16/21   Pyrtle, Lajuan Lines, MD  cimetidine (TAGAMET) 200 MG tablet Take by mouth. 01/30/19 01/16/20  [provider]    Family History Family History  Problem Relation Age of Onset   Lupus Mother    Heart attack Father    Arthritis Father    Hypertension Father    Cancer Father        multiple myeloma   Diabetes Father    Edema Sister    Diabetes Sister    Bipolar disorder Sister    Colon cancer Neg Hx    Colon polyps Neg Hx    Esophageal cancer Neg Hx    Rectal cancer Neg Hx    Stomach cancer Neg Hx     Social History Social History   Tobacco  Use   Smoking status: Never   Smokeless tobacco: Never  Vaping Use   Vaping Use: Never used  Substance Use Topics   Alcohol use: No   Drug use: No     Allergies   Lactose, Caffeine, Omeprazole, Peanuts [peanut oil], Penicillins, and Seasonal ic [cholestatin]   Review of Systems Review of Systems  Constitutional:  Positive for activity change. Negative for appetite change, fatigue  and fever.  Respiratory:  Negative for cough and shortness of breath.   Cardiovascular:  Negative for chest pain.  Gastrointestinal:  Negative for abdominal pain, diarrhea, nausea and vomiting.  Musculoskeletal:  Positive for arthralgias, back pain, gait problem and myalgias.  Skin:  Positive for wound.  Neurological:  Positive for numbness. Negative for dizziness, weakness, light-headedness and headaches.    Physical Exam Triage Vital Signs ED Triage Vitals  Enc Vitals Group     BP 04/29/21 1701 (!) 141/77     Pulse Rate 04/29/21 1701 81     Resp 04/29/21 1701 18     Temp 04/29/21 1701 98.2 F (36.8 C)     Temp Source 04/29/21 1701 Oral     SpO2 04/29/21 1701 96 %     Weight --      Height --      Head Circumference --      Peak Flow --      Pain Score 04/29/21 1707 10     Pain Loc --      Pain Edu? --      Excl. in [place]? --    No data found.  Updated Vital Signs BP (!) 141/77 (BP Location: Left Arm)   Pulse 81   Temp 98.2 F (36.8 C) (Oral)   Resp 18   SpO2 96%   Visual Acuity Right Eye Distance:   Left Eye Distance:   Bilateral Distance:    Right Eye Near:   Left Eye Near:    Bilateral Near:     Physical Exam Vitals reviewed.  Constitutional:      General: He is awake.     Appearance: Normal appearance. He is normal weight. He is not ill-appearing.     Comments: Very pleasant male appears in age no acute distress sitting comfortably in exam room  HENT:     Head: Normocephalic and atraumatic.  Cardiovascular:     Rate and Rhythm: Normal rate and regular rhythm.      Pulses:          Posterior tibial pulses are 2+ on the right side and 2+ on the left side.     Heart sounds: Normal heart sounds, S1 normal and S2 normal. No murmur heard. Pulmonary:     Effort: Pulmonary effort is normal.     Breath sounds: Normal breath sounds. No stridor. No wheezing, rhonchi or rales.     Comments: Clear auscultation bilaterally Abdominal:     General: Bowel sounds are normal.     Palpations: Abdomen is soft.     Tenderness: There is no abdominal tenderness.  Musculoskeletal:     Cervical back: No tenderness or bony tenderness.     Thoracic back: No tenderness or bony tenderness.     Lumbar back: Tenderness present. No bony tenderness. Negative right straight leg raise test and negative left straight leg raise test.     Right lower leg: No edema.     Left lower leg: Tenderness present. No deformity or bony tenderness. No edema.     Left ankle: Tenderness present. Normal range of motion.     Comments: Tender to palpation over medial left ankle and lateral left knee.  No pain percussion of vertebrae.  Tenderness palpation of left lumbar paraspinal muscles.  Negative straight leg raise bilaterally.  No deformity noted on exam.  Strength 5/5 bilateral lower extremities and knee and ankle.  Negative Homans' sign on left.  Neurological:     Mental  Status: He is alert.  Psychiatric:        Behavior: Behavior is cooperative.      UC Treatments / Results  Labs (all labs ordered are listed, but only abnormal results are displayed) Labs Reviewed - No data to display  EKG   Radiology No results found.  Procedures Procedures (including critical care time)  Medications Ordered in UC Medications - No data to display  Initial Impression / Assessment and Plan / UC Course  I have reviewed the triage vital signs and the nursing notes.  Pertinent labs & imaging results that were available during my care of the patient were reviewed by me and considered in my medical  decision making (see chart for details).      No indication for x-ray given no bony tenderness and no recent trauma; patient reports that he was able to bear weight and had no significant pain following his fall.  No evidence of infection that would warrant systemic antibiotics.  We will treat abrasions with Bactroban.  Low suspicion for DVT but given history and unilateral leg pain will obtain ultrasound to rule out DVT.  Discussed that if he has any chest pain, shortness of breath, severe pain he needs to go to the emergency room overnight.  Strongly suspect symptoms are related to muscle pain versus gout and so he was started on prednisone taper with instruction not to take NSAIDs with this medication due to risk of GI bleeding.  He was prescribed Zanaflex with instruction not to drive or drink alcohol with this medication as drowsiness is a common side effect.  Encouraged him to use heat, rest, stretch for additional symptom relief.  Discussed alarm symptoms that warrant emergent evaluation.  Strict return precautions given to which patient expressed understanding.  Final Clinical Impressions(s) / UC Diagnoses   Final diagnoses:  Acute left-sided low back pain with left-sided sciatica  Left leg pain  Acute left ankle pain  Skin abrasion     Discharge Instructions      Go get ultrasound tomorrow.  If you have any worsening symptoms including chest pain or increased pain go to the hospital overnight.  Use Bactroban on prescription to prevent infection.  If you have any oozing or redness please return for reevaluation.  Start prednisone to help with symptoms.  Do not take NSAIDs including aspirin, ibuprofen/Advil, naproxen/Aleve with this medication due to risk of stomach bleeding.  Start tizanidine 3 times a day.  This can make you sleepy do not drive or drink alcohol taking it.  Follow-up with your primary care doctor within 1 week to ensure symptom improvement.  If anything worsens please  return for reevaluation.     ED Prescriptions     Medication Sig Dispense Auth. Provider   predniSONE (STERAPRED UNI-PAK 21 TAB) 10 MG (21) TBPK tablet As directed 21 tablet Raspet, Erin K, PA-C   tiZANidine (ZANAFLEX) 4 MG capsule Take 1 capsule (4 mg total) by mouth 3 (three) times daily as needed for muscle spasms. 21 capsule Raspet, Erin K, PA-C   mupirocin ointment (BACTROBAN) 2 % Apply 1 application topically daily. 22 g Raspet, Erin K, PA-C      PDMP not reviewed this encounter.   Terrilee Croak, PA-C 04/29/21 1740

## 2021-04-29 NOTE — Progress Notes (Signed)
Virtual Visit via Video Note  I connected with Francis Lin  on 04/29/21 at  3:00 PM EDT by a video enabled telemedicine application and verified that I am speaking with the correct person using two identifiers. The video failed and completed the visit in audio only  Location patient: home, [place] Location provider:work or home office Persons participating in the virtual visit: patient, provider  I discussed the limitations of evaluation and management by telemedicine and the availability of in person appointments. The patient expressed understanding and agreed to proceed.   HPI:  Acute telemedicine visit for L lower leg pain and swelling: -Onset: 1 week ago go went to beach and suffered a wound in the water that was bleeding - he was not sure how this happened; it get red and swollen - but then improved some. However, he then developed pain up his whole L leg from his ankle up his hip and all the way up to his hip to the point that he could not walk without crutches. He had some swelling in the ankle.  -he tried his prednisone and his colchicine he has for gout but it did not help much -he had cold sweats last night as well and now back pain - he is not sure if he had a fever -he has had severe gout in the past and ankle swelling in the past bilaterally per chart  ROS: See pertinent positives and negatives per HPI.  Past Medical History:  Diagnosis Date   Acid reflux    Allergy    Anxiety    stress    Clotting disorder (HCC)    right leg x 2 in calf and behind knee    DVT (deep venous thrombosis) ([facility]CC)    Esophagitis    Gout    Hypersomnia, persistent    IBS (irritable bowel syndrome)    PONV (postoperative nausea and vomiting)    nausea with no vomiting    Shifting sleep-work schedule, affecting sleep 06/20/2013   Snoring disorder 06/20/2013   Tubular adenoma of colon     Past Surgical History:  Procedure Laterality Date   COLONOSCOPY     HEMORRHOID BANDING     ORIF TIBIA & FIBULA  FRACTURES Right    right leg surgery x 2 places- rod in place knee to ankle    UPPER GASTROINTESTINAL ENDOSCOPY     wisdom teeth     wrist surgery Left      Current Outpatient Medications:    acetaminophen (TYLENOL) 325 MG tablet, Take 650 mg by mouth every 6 (six) hours as needed for mild pain., Disp: , Rfl:    aspirin EC 81 MG tablet, Take 81 mg by mouth every 6 (six) hours as needed for mild pain., Disp: , Rfl:    colchicine 0.6 MG tablet, Take by mouth as needed., Disp: , Rfl:    ibuprofen (ADVIL) 200 MG tablet, Take 200 mg by mouth every 6 (six) hours as needed for fever or mild pain., Disp: , Rfl:    loratadine (CLARITIN) 10 MG tablet, Take 10 mg by mouth as needed for allergies., Disp: , Rfl:    meloxicam (MOBIC) 15 MG tablet, Take 15 mg by mouth daily as needed for pain. , Disp: , Rfl:    mometasone (NASONEX) 50 MCG/ACT nasal spray, Place 2 sprays into the nose daily., Disp: 1 each, Rfl: 12   ondansetron (ZOFRAN) 4 MG tablet, Take 1 tablet (4 mg total) by mouth every 8 (eight) hours as  needed., Disp: 12 tablet, Rfl: 0   OVER THE COUNTER MEDICATION, GoutFlex-as needed, Disp: , Rfl:    RABEprazole (ACIPHEX) 20 MG tablet, Take 1 tablet (20 mg total) by mouth daily., Disp: 30 tablet, Rfl: 2  EXAM:  VITALS per patient if applicable:  GENERAL: alert, oriented, appears well and in no acute distress  HEENT: atraumatic, conjunttiva clear, no obvious abnormalities on inspection of external nose and ears  NECK: normal movements of the head and neck  LUNGS: on inspection no signs of respiratory distress, breathing rate appears normal, no obvious gross SOB, gasping or wheezing  CV: no obvious cyanosis  MS: moves all visible extremities without noticeable abnormality  PSYCH/NEURO: pleasant and cooperative, no obvious depression or anxiety, speech and thought processing grossly intact  ASSESSMENT AND PLAN:  Discussed the following assessment and plan:  Pain of right lower  extremity  Swelling  Chills  Diaphoresis  -we discussed possible serious and likely etiologies, options for evaluation and workup, limitations of telemedicine visit vs in person visit, treatment, treatment risks and precautions. Pt prefers to treat via telemedicine empirically rather than in person at this moment.  Given the reported severity of his symptoms, his past medical and recent travel with injury and seawater, advised prompt person evaluation.   Discussed options and they prefer to seek in person care at an urgent care this evening, and they report PCP office was not available today.  Discussed options for inperson care if PCP office not available. Did let this patient know that I only do telemedicine on Tuesdays and Thursdays for [place]. Advised to schedule follow up visit with PCP or [facility] if any further questions or concerns to avoid delays in care.   I discussed the assessment and treatment plan with the patient. The patient was provided an opportunity to ask questions and all were answered. The patient agreed with the plan and demonstrated an understanding of the instructions.     Lucretia Kern, DO

## 2021-04-29 NOTE — Patient Instructions (Signed)
Seek in person evaluation today as we discussed in urgent care.  If you are having severe or life-threatening symptoms, please seek care at the emergency room/or call 911 if you are need of assistance with transportation.    I hope you are feeling better soon! It was nice to meet you today. I help [facility] out with telemedicine visits on Tuesdays and Thursdays and am available for visits on those days. If you have any concerns or questions following this visit please schedule a follow up visit with your Primary Care doctor or seek care at a local urgent care clinic to avoid delays in care.

## 2021-04-29 NOTE — Discharge Instructions (Addendum)
Go get ultrasound tomorrow.  If you have any worsening symptoms including chest pain or increased pain go to the hospital overnight.  Use Bactroban on prescription to prevent infection.  If you have any oozing or redness please return for reevaluation.  Start prednisone to help with symptoms.  Do not take NSAIDs including aspirin, ibuprofen/Advil, naproxen/Aleve with this medication due to risk of stomach bleeding.  Start tizanidine 3 times a day.  This can make you sleepy do not drive or drink alcohol taking it.  Follow-up with your primary care doctor within 1 week to ensure symptom improvement.  If anything worsens please return for reevaluation.

## 2021-04-30 ENCOUNTER — Ambulatory Visit (HOSPITAL_COMMUNITY)
Admission: RE | Admit: 2021-04-30 | Discharge: 2021-04-30 | Disposition: A | Discharge status: Home / Self Care | Payer: Managed Care, Other (non HMO) | Source: Ambulatory Visit | Attending: Physician Assistant | Admitting: Physician Assistant

## 2021-04-30 DIAGNOSIS — M79605 Pain in left leg: Secondary | ICD-10-CM

## 2021-04-30 DIAGNOSIS — M7989 Other specified soft tissue disorders: Secondary | ICD-10-CM | POA: Diagnosis not present

## 2021-04-30 DIAGNOSIS — M79606 Pain in leg, unspecified: Secondary | ICD-10-CM | POA: Insufficient documentation

## 2021-04-30 NOTE — Progress Notes (Signed)
Lower extremity venous has been completed.   Preliminary results in CV Proc.   Abram Sander 04/30/2021 11:07 AM

## 2021-05-18 ENCOUNTER — Telehealth: Payer: Self-pay | Admitting: Internal Medicine

## 2021-05-18 NOTE — Telephone Encounter (Signed)
Hey Dr. Hilarie Fredrickson,   Patient called in to cancel procedure 8/11. He have to take his daughter back to college. Will call back to reschedule.   Thank you.

## 2021-05-20 ENCOUNTER — Encounter: Payer: Managed Care, Other (non HMO) | Admitting: Internal Medicine

## 2021-05-30 ENCOUNTER — Other Ambulatory Visit: Payer: Self-pay

## 2021-05-30 ENCOUNTER — Ambulatory Visit
Admission: RE | Admit: 2021-05-30 | Discharge: 2021-05-30 | Disposition: A | Discharge status: Home / Self Care | Payer: Managed Care, Other (non HMO) | Source: Ambulatory Visit | Attending: Family Medicine | Admitting: Family Medicine

## 2021-05-31 ENCOUNTER — Encounter (HOSPITAL_BASED_OUTPATIENT_CLINIC_OR_DEPARTMENT_OTHER): Payer: Self-pay

## 2021-05-31 ENCOUNTER — Emergency Department (HOSPITAL_BASED_OUTPATIENT_CLINIC_OR_DEPARTMENT_OTHER)
Admission: EM | Admit: 2021-05-31 | Discharge: 2021-05-31 | Disposition: A | Discharge status: Home / Self Care | Payer: Managed Care, Other (non HMO) | Attending: Emergency Medicine | Admitting: Emergency Medicine

## 2021-05-31 ENCOUNTER — Emergency Department (HOSPITAL_BASED_OUTPATIENT_CLINIC_OR_DEPARTMENT_OTHER): Payer: Managed Care, Other (non HMO)

## 2021-05-31 ENCOUNTER — Telehealth: Payer: Self-pay | Admitting: Family Medicine

## 2021-05-31 ENCOUNTER — Other Ambulatory Visit: Payer: Self-pay

## 2021-05-31 DIAGNOSIS — R911 Solitary pulmonary nodule: Secondary | ICD-10-CM | POA: Diagnosis not present

## 2021-05-31 DIAGNOSIS — N50811 Right testicular pain: Secondary | ICD-10-CM | POA: Diagnosis not present

## 2021-05-31 DIAGNOSIS — M542 Cervicalgia: Secondary | ICD-10-CM | POA: Insufficient documentation

## 2021-05-31 DIAGNOSIS — M545 Low back pain, unspecified: Secondary | ICD-10-CM | POA: Insufficient documentation

## 2021-05-31 DIAGNOSIS — Z91010 Allergy to peanuts: Secondary | ICD-10-CM | POA: Diagnosis not present

## 2021-05-31 DIAGNOSIS — R072 Precordial pain: Secondary | ICD-10-CM | POA: Diagnosis not present

## 2021-05-31 DIAGNOSIS — R109 Unspecified abdominal pain: Secondary | ICD-10-CM | POA: Diagnosis not present

## 2021-05-31 LAB — BASIC METABOLIC PANEL
Anion gap: 8 (ref 5–15)
BUN: 12 mg/dL (ref 6–20)
CO2: 26 mmol/L (ref 22–32)
Calcium: 8.8 mg/dL — ABNORMAL LOW (ref 8.9–10.3)
Chloride: 102 mmol/L (ref 98–111)
Creatinine, Ser: 1.31 mg/dL — ABNORMAL HIGH (ref 0.61–1.24)
GFR, Estimated: >60 mL/min (ref >60)
Glucose, Bld: 87 mg/dL (ref 70–99)
Potassium: 4.1 mmol/L (ref 3.5–5.1)
Sodium: 136 mmol/L (ref 135–145)

## 2021-05-31 LAB — CBC
HCT: 46.9 % (ref 39.0–52.0)
Hemoglobin: 15.7 g/dL (ref 13.0–17.0)
MCH: 29.7 pg (ref 26.0–34.0)
MCHC: 33.5 g/dL (ref 30.0–36.0)
MCV: 88.7 fL (ref 80.0–100.0)
Platelets: 273 10*3/uL (ref 150–400)
RBC: 5.29 MIL/uL (ref 4.22–5.81)
RDW: 13.8 % (ref 11.5–15.5)
WBC: 7.3 10*3/uL (ref 4.0–10.5)
nRBC: 0 % (ref 0.0–0.2)

## 2021-05-31 LAB — URINALYSIS, ROUTINE W REFLEX MICROSCOPIC
Bilirubin Urine: NEGATIVE
Glucose, UA: NEGATIVE mg/dL
Hgb urine dipstick: NEGATIVE
Ketones, ur: NEGATIVE mg/dL
Leukocytes,Ua: NEGATIVE
Nitrite: NEGATIVE
Protein, ur: NEGATIVE mg/dL
Specific Gravity, Urine: 1.015 (ref 1.005–1.030)
pH: 6 (ref 5.0–8.0)

## 2021-05-31 LAB — TROPONIN I (HIGH SENSITIVITY)
Troponin I (High Sensitivity): <2 ng/L (ref <18)
Troponin I (High Sensitivity): 2 ng/L (ref <18)

## 2021-05-31 NOTE — Discharge Instructions (Addendum)
As we discussed work-up from a cardiac standpoint without any acute findings.  There was evidence of a left pulmonary nodule.  It appears to be new since nodule.  Early part of this year.  They recommend repeat CT scan in 3 to 6 months.  Your primary care doctor can assist with this.  Make an appointment to follow-up with sports medicine in regards to the neck pain and lumbar back pain.  You have radicular symptoms in the past.  Would consider MRI of neck and lumbar area.

## 2021-05-31 NOTE — ED Provider Notes (Addendum)
[facility] [facility] EMERGENCY DEPARTMENT Provider Note   CSN: [id number] Arrival date & time: 05/31/21  1312     History Chief Complaint  Patient presents with   Chest Pain   Testicle Pain    Normal Francis Lin is a 45 y.o. male.  Patient presenting to the emergency department with a complaint of chest pain and right testicular pain.  Specifics of his presentation is that he had substernal chest pain around 0 800.  Went away somewhere between 1011 100.  Associated with shortness of breath is now resolved.  Patient's room air sats in the 100%.  Also felt like maybe had some heart palpitations.  Patient states that about a year ago he was seen by cardiology sounds like he is describing a CT coronary artery scan and that he a nonexercise stress test.  But patient kind of vague about where this was done.  He thought it was done in the [facility] system I will check records.  Patient also yesterday had right flank pain which has persisted.  Will get flank pain yesterday had some pain in the testicle.  No tenderness to palpation of the testicle.  The testicle pain and now has completely resolved.  But had no tenderness to palpation when it is present.  Patient's past medical history is significant for deep vein thrombosis esophagitis irritable bowel syndrome.  Patient has been seen at this facility in the spring for chest pain without any acute findings.  Patient also with a complaint for several months posterior neck pain and lumbar back pain.  Patient states that has had x-rays of that area.  He is followed by [facility].  Patient is here requesting MRI I informed him that we do not have MRI available here.  Patient currently has no radicular type pains.  But does describe in the past having that to the left leg.  Patient does state about a history of gout.  But I doubt that that would cause neck pain and lumbar back pain.      Past Medical History:  Diagnosis Date   Acid reflux     Allergy    Anxiety    stress    Clotting disorder (HCC)    right leg x 2 in calf and behind knee    DVT (deep venous thrombosis) (HCC)    Esophagitis    Gout    Hypersomnia, persistent    IBS (irritable bowel syndrome)    PONV (postoperative nausea and vomiting)    nausea with no vomiting    Shifting sleep-work schedule, affecting sleep 06/20/2013   Snoring disorder 06/20/2013   Tubular adenoma of colon     Patient Active Problem List   Diagnosis Date Noted   LUQ abdominal pain 11/03/2020   Generalized abdominal pain 11/03/2020   Altered bowel habits 11/03/2020   Nausea without vomiting 11/03/2020   Migraine without aura and without status migrainosus, not intractable 11/22/2018   Nonsustained ventricular tachycardia ([place]) 08/03/2018   Elevated serum creatinine 06/18/2018   Slow heart rate 06/06/2018   Shortness of breath 04/04/2018   Venous insufficiency 01/05/2017   Deep vein thrombosis (DVT) of popliteal vein of right lower extremity ([place]) 06/30/2016   S/P ORIF (open reduction internal fixation) fracture 06/22/2016   Multiple fractures of both lower extremities 05/31/2016   Elevated CK 05/17/2016   Fracture of right fibula 05/17/2016   Fracture of right tibia 05/17/2016   Fracture of tibia, left, closed 05/17/2016   Trauma  05/17/2016   Situational anxiety 08/28/2014   Unspecified vitamin D deficiency 09/22/2013   Shifting sleep-work schedule, affecting sleep 06/20/2013   Snoring disorder 06/20/2013   Hypersomnia, persistent    History of esophageal stricture 04/16/2013   Pain in joint, ankle and foot 04/16/2013   Gout 04/16/2013   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   OBESITY, CLASS II, BMI 35- 39.9 03/15/2012   Hypertriglyceridemia 03/15/2012   Atypical chest pain 12/02/2008   Dysphagia 12/02/2008    Past Surgical History:  Procedure Laterality Date   COLONOSCOPY     HEMORRHOID BANDING     ORIF TIBIA & FIBULA FRACTURES Right    right leg surgery x 2  places- rod in place knee to ankle    UPPER GASTROINTESTINAL ENDOSCOPY     wisdom teeth     wrist surgery Left        Family History  Problem Relation Age of Onset   Lupus Mother    Heart attack Father    Arthritis Father    Hypertension Father    Cancer Father        multiple myeloma   Diabetes Father    Edema Sister    Diabetes Sister    Bipolar disorder Sister    Colon cancer Neg Hx    Colon polyps Neg Hx    Esophageal cancer Neg Hx    Rectal cancer Neg Hx    Stomach cancer Neg Hx     Social History   Tobacco Use   Smoking status: Never   Smokeless tobacco: Never  Vaping Use   Vaping Use: Never used  Substance Use Topics   Alcohol use: No   Drug use: No    Home Medications Prior to Admission medications   Medication Sig Start Date End Date Taking? Authorizing Provider  acetaminophen (TYLENOL) 325 MG tablet Take 650 mg by mouth every 6 (six) hours as needed for mild pain.    [provider]  aspirin EC 81 MG tablet Take 81 mg by mouth every 6 (six) hours as needed for mild pain.    [provider]  colchicine 0.6 MG tablet Take by mouth as needed. 12/24/18   [provider]  ibuprofen (ADVIL) 200 MG tablet Take 200 mg by mouth every 6 (six) hours as needed for fever or mild pain.    [provider]  loratadine (CLARITIN) 10 MG tablet Take 10 mg by mouth as needed for allergies.    [provider]  meloxicam (MOBIC) 15 MG tablet Take 15 mg by mouth daily as needed for pain.  02/12/20   [provider]  mometasone (NASONEX) 50 MCG/ACT nasal spray Place 2 sprays into the nose daily. 04/07/21   Burchette, Alinda Sierras, MD  mupirocin ointment (BACTROBAN) 2 % Apply 1 application topically daily. 04/29/21   Raspet, Erin K, PA-C  ondansetron (ZOFRAN) 4 MG tablet Take 1 tablet (4 mg total) by mouth every 8 (eight) hours as needed. 02/24/21   Tegeler, Gwenyth Allegra, MD  OVER THE COUNTER MEDICATION GoutFlex-as needed    [provider]  predniSONE (STERAPRED UNI-PAK 21 TAB) 10 MG (21) TBPK tablet As directed 04/29/21   Raspet, Erin K, PA-C  RABEprazole (ACIPHEX) 20 MG tablet Take 1 tablet (20 mg total) by mouth daily. 04/16/21   Pyrtle, Lajuan Lines, MD  tiZANidine (ZANAFLEX) 4 MG capsule Take 1 capsule (4 mg total) by mouth 3 (three) times daily as needed for muscle spasms. 04/29/21   Raspet,  Derry Skill, PA-C  cimetidine (TAGAMET) 200 MG tablet Take by mouth. 01/30/19 01/16/20  [provider]    Allergies    Lactose, Caffeine, Omeprazole, Peanuts [peanut oil], Penicillins, and Seasonal ic [cholestatin]  Review of Systems   Review of Systems  Constitutional:  Negative for chills and fever.  HENT:  Negative for ear pain and sore throat.   Eyes:  Negative for pain and visual disturbance.  Respiratory:  Positive for shortness of breath. Negative for cough.   Cardiovascular:  Positive for chest pain. Negative for palpitations.  Gastrointestinal:  Negative for abdominal pain and vomiting.  Genitourinary:  Positive for flank pain and testicular pain. Negative for dysuria, hematuria, penile pain and scrotal swelling.  Musculoskeletal:  Negative for arthralgias and back pain.  Skin:  Negative for color change and rash.  Neurological:  Negative for seizures and syncope.  All other systems reviewed and are negative.  Physical Exam Updated Vital Signs BP (!) 154/105 (BP Location: Right Arm)   Pulse 62   Temp 98.3 F (36.8 C) (Oral)   Resp 19   Ht 1.905 m ($Remove'6\' 3"'YUqDZct$ )   Wt 104.3 kg   SpO2 98%   BMI 28.75 kg/m   Physical Exam Vitals and nursing note reviewed.  Constitutional:      Appearance: Normal appearance. He is well-developed.  HENT:     Head: Normocephalic and atraumatic.  Eyes:     Extraocular Movements: Extraocular movements intact.     Conjunctiva/sclera: Conjunctivae normal.     Pupils: Pupils are equal, round, and reactive to light.  Cardiovascular:     Rate and Rhythm: Normal rate and regular  rhythm.     Heart sounds: No murmur heard. Pulmonary:     Effort: Pulmonary effort is normal. No respiratory distress.     Breath sounds: Normal breath sounds.  Chest:     Chest wall: No tenderness.  Abdominal:     Palpations: Abdomen is soft.     Tenderness: There is no abdominal tenderness.  Genitourinary:    Penis: Normal.      Testes: Normal.     Comments: No scrotal swelling.  No tenderness to left or right testicle.  There is a little area of induration in the right groin area.  No evidence of hernia.  No evidence of abscess.  Induration measuring about 1 x 2 cm.  Patient stated that he had a follicle infection in that area. Musculoskeletal:        General: No swelling. Normal range of motion.     Cervical back: Normal range of motion and neck supple.  Skin:    General: Skin is warm and dry.     Capillary Refill: Capillary refill takes less than 2 seconds.  Neurological:     General: No focal deficit present.     Mental Status: He is alert and oriented to person, place, and time.     Cranial Nerves: No cranial nerve deficit.    ED Results / Procedures / Treatments   Labs (all labs ordered are listed, but only abnormal results are displayed) Labs Reviewed  BASIC METABOLIC PANEL - Abnormal; Notable for the following components:      Result Value   Creatinine, Ser 1.31 (*)    Calcium 8.8 (*)    All other components within normal limits  CBC  URINALYSIS, ROUTINE W REFLEX MICROSCOPIC  TROPONIN I (HIGH SENSITIVITY)  TROPONIN I (HIGH SENSITIVITY)    EKG EKG Interpretation  Date/Time:  Monday  May 31 2021 13:33:07 EDT Ventricular Rate:  66 PR Interval:  148 QRS Duration: 98 QT Interval:  380 QTC Calculation: 398 R Axis:   -1 Text Interpretation: Normal sinus rhythm Normal ECG Confirmed by Lennice Sites ([id number]) on 05/31/2021 1:34:54 PM  Radiology DG Chest 2 View  Result Date: 05/31/2021 CLINICAL DATA:  Neck and back pain sat/sun, sob today. EXAM: CHEST - 2 VIEW  COMPARISON:  02/24/2021 FINDINGS: Lungs are clear. Heart size and mediastinal contours are within normal limits. No effusion. Visualized bones unremarkable. IMPRESSION: No acute cardiopulmonary disease. Electronically Signed   By: Lucrezia Europe M.D.   On: 05/31/2021 14:26   CT Renal Stone Study  Result Date: 05/31/2021 CLINICAL DATA:  Lower back pain radiating to right testicle since Saturday EXAM: CT ABDOMEN AND PELVIS WITHOUT CONTRAST TECHNIQUE: Multidetector CT imaging of the abdomen and pelvis was performed following the standard protocol without IV contrast. Unenhanced CT was performed per clinician order. Lack of IV contrast limits sensitivity and specificity, especially for evaluation of abdominal/pelvic solid viscera. COMPARISON:  11/16/2020 FINDINGS: Lower chest: There are 2 left lower lobe pulmonary nodules identified on this exam, new since prior study. 6 mm nodules identified on image 3 and a 9 mm nodule identified on image 6. Stable subpleural lingular scarring. No effusion or pneumothorax. Hepatobiliary: Unenhanced imaging of the liver and gallbladder demonstrate no abnormalities. No biliary duct dilation. Pancreas: Unremarkable. No pancreatic ductal dilatation or surrounding inflammatory changes. Spleen: Normal in size without focal abnormality. Adrenals/Urinary Tract: Chronic scarring upper pole right kidney. No urinary tract calculi or obstructive uropathy within either kidney. The adrenals and bladder are unremarkable. Stomach/Bowel: No bowel obstruction or ileus. Normal appendix right lower quadrant. No bowel wall thickening or inflammatory change. Vascular/Lymphatic: Stable nonspecific subcentimeter lymph nodes and associated mesenteric fat stranding within the central upper abdomen, likely sequela of previous inflammation or infection. No pathologic adenopathy within the abdomen or pelvis. No significant vascular findings on this limited unenhanced exam. Reproductive: Prostate is unremarkable.  Other: No free fluid or free gas.  No abdominal wall hernia. Musculoskeletal: Minimal subcutaneous fat stranding in the right inguinal region, medial to the inguinal fold, reference image 103, measuring up to 9 mm in size. This is likely focal inflammation or infection. No fluid collection or abscess. No acute or destructive bony lesions. Reconstructed images demonstrate no additional findings. IMPRESSION: 1. Focal subcutaneous inflammatory change in the right inguinal region, measuring up to 9 mm. No fluid collection or abscess. 2. Otherwise no acute intra-abdominal or intrapelvic process. Normal appendix. 3. Interval development of nonspecific subcentimeter left lower lobe pulmonary nodules, likely inflammatory or infectious given development since 11/16/2020. Non-contrast chest CT at 3-6 months is recommended. If the nodules are stable at time of repeat CT, then future CT at 18-24 months (from today's scan) is considered optional for low-risk patients, but is recommended for high-risk patients. This recommendation follows the consensus statement: Guidelines for Management of Incidental Pulmonary Nodules Detected on CT Images: From the Fleischner Society 2017; Radiology 2017; 284:228-243. Electronically Signed   By: Randa Ngo M.D.   On: 05/31/2021 17:08    Procedures Procedures   Medications Ordered in ED Medications - No data to display  ED Course  I have reviewed the triage vital signs and the nursing notes.  Pertinent labs & imaging results that were available during my care of the patient were reviewed by me and considered in my medical decision making (see chart for details).    MDM Rules/Calculators/A&P  Patient's initial troponin was normal.  Labs without any significant abnormalities.  Delta troponin is pending.  Chest x-ray without any acute findings.  EKG without any significant changes.  Urinalysis pending.  Urinalysis back and is normal.  Will check delta  troponin.  We will going get's CT renal study due to the right flank pain.  Delta troponin without any acute changes.  CT abdomen to rule out kidney stone no evidence of kidney stone.  There is area of focal subcutaneous inflammatory changes right inguinal area.  Measuring up to 9 mm no fluid or abscess collection.  I think this but on feeling on his groin exam.  There is interval development of a nonspecific subcentimeter left lower lobe pulmonary nodule likely inflammatory or infectious given developments since November 16, 2020.  Recommending noncontrast CT at that 3 to 73-monthinterval.  Patient made aware of this.  We will follow-up with primary care doctor for this.  Will give patient referral to sports medicine for his neck and back pain.  Also will make recommendation of primary care doctor for consideration for MRI to further evaluate the symptoms.  No evidence of any emergent need for MRI today.  Patient's had symptoms consistent with left-sided sciatica in the past.  Does not have any radiculopathy symptoms today.  Final Clinical Impression(s) / ED Diagnoses Final diagnoses:  Precordial pain  Flank pain, acute  Pulmonary nodule    Rx / DC Orders ED Discharge Orders     None        ZFredia Sorrow MD 05/31/21 [id number]    ZFredia Sorrow MD 05/31/21 [id number]   ZFredia Sorrow MD 05/31/21 [id number]   ZFredia Sorrow MD 05/31/21 [id number]   ZFredia Sorrow MD 05/31/21 [phone]

## 2021-05-31 NOTE — Telephone Encounter (Signed)
Pt call and stated he is at ER and want a referral to have a mri of his neck and back. Pt want a call back.

## 2021-05-31 NOTE — ED Triage Notes (Signed)
Pt c/o neck pain/shoulders/back pain. States lower back pain radiating to right testicle. Also c/o shortness of breath & chest pain starting today.

## 2021-06-01 ENCOUNTER — Emergency Department (HOSPITAL_COMMUNITY)
Admission: EM | Admit: 2021-06-01 | Discharge: 2021-06-01 | Disposition: A | Discharge status: Home / Self Care | Payer: Managed Care, Other (non HMO) | Attending: Emergency Medicine | Admitting: Emergency Medicine

## 2021-06-01 ENCOUNTER — Emergency Department (HOSPITAL_COMMUNITY): Payer: Managed Care, Other (non HMO)

## 2021-06-01 ENCOUNTER — Encounter (HOSPITAL_COMMUNITY): Payer: Self-pay | Admitting: Emergency Medicine

## 2021-06-01 ENCOUNTER — Other Ambulatory Visit: Payer: Self-pay

## 2021-06-01 DIAGNOSIS — Z91010 Allergy to peanuts: Secondary | ICD-10-CM | POA: Diagnosis not present

## 2021-06-01 DIAGNOSIS — M62838 Other muscle spasm: Secondary | ICD-10-CM | POA: Insufficient documentation

## 2021-06-01 DIAGNOSIS — M542 Cervicalgia: Secondary | ICD-10-CM

## 2021-06-01 DIAGNOSIS — Z86010 Personal history of colonic polyps: Secondary | ICD-10-CM | POA: Diagnosis not present

## 2021-06-01 DIAGNOSIS — Z7982 Long term (current) use of aspirin: Secondary | ICD-10-CM | POA: Diagnosis not present

## 2021-06-01 MED ORDER — KETOROLAC TROMETHAMINE 60 MG/2ML IM SOLN
30.0000 mg | Freq: Once | INTRAMUSCULAR | Status: AC
  Administered 2021-06-01: 30 mg via INTRAMUSCULAR
  Filled 2021-06-01: qty 2

## 2021-06-01 MED ORDER — CYCLOBENZAPRINE HCL 10 MG PO TABS: 10.0000 mg | ORAL_TABLET | Freq: Two times a day (BID) | ORAL | 0 refills | Status: DC | PRN

## 2021-06-01 MED ORDER — CYCLOBENZAPRINE HCL 10 MG PO TABS
10.0000 mg | ORAL_TABLET | Freq: Once | ORAL | Status: AC
  Administered 2021-06-01: 10 mg via ORAL
  Filled 2021-06-01: qty 1

## 2021-06-01 MED ORDER — IBUPROFEN 600 MG PO TABS: 600.0000 mg | ORAL_TABLET | Freq: Four times a day (QID) | ORAL | 0 refills | Status: DC | PRN

## 2021-06-01 NOTE — ED Provider Notes (Signed)
Emergency Medicine Provider Triage Evaluation Note  Francis Lin , a 45 y.o. male  was evaluated in triage.  Pt complains of left-sided posterior neck pain onset 3 days ago after a long car trip.  Patient reports the stiffness has gotten worse over time.  He took meloxicam around 10 PM but no other treatments prior to arrival.  Patient reports he was seen at [facility] earlier in the evening but they did not have an MRI.  Patient states that he called his primary care who referred him here for MRI however note in the chart states that they recommended outpatient evaluation for outpatient MRI.  Patient without fever or chills.  Pain with range of motion.  Patient adamantly denies falls or known injury.  He is also adamant that he needs an MRI tonight.  Records reviewed.  No previous imaging of the neck.  Review of Systems  Positive: Neck pain, neck stiffness Negative: Fever, chills, change in voice, vision changes  Physical Exam  BP (!) 145/91 (BP Location: Right Arm)   Pulse 80   Temp 98.8 F (37.1 C) (Oral)   Resp 18   SpO2 100%  Gen:   Awake, no distress  Resp:  Normal effort  MSK:   Moves extremities without difficulty  Other:  Left-sided paraspinal tenderness with palpable spasm.  Tenderness throughout the left scapula.  Minimal midline discomfort, no step-off or deformity.  Handling secretions without difficulty, normal phonation.  Strength 5/5 in the bilateral upper and lower extremities.  Sensation intact to the bilateral upper and lower extremities.  Ambulatory without difficulty.  Medical Decision Making  Medically screening exam initiated at 2:08 AM.  Appropriate orders placed.  Francis Lin was informed that the remainder of the evaluation will be completed by another provider, this initial triage assessment does not replace that evaluation, and the importance of remaining in the ED until their evaluation is complete.  Patient presents with ongoing neck pain  for the last 3 days.  Nontraumatic.  On exam he has no focal neurologic deficits.  At this time I do not believe that he has central cord compression or other etiology to suggest requirement of emergent MRI.  Will order CT head and neck.  Patient is unsatisfied with this plan.     Francis Lin, Francis Lin 06/01/21 [id number]    Quintella Reichert, MD 06/01/21 [phone]

## 2021-06-01 NOTE — Discharge Instructions (Addendum)
Recommend following up with with your primary care doctor as well as with the spine specialist.  Recommend taking anti-inflammatory as prescribed as well as muscle relaxer.  Note the muscle relaxer can make you somewhat drowsy should not be taken when driving or operating heavy machinery.

## 2021-06-01 NOTE — ED Triage Notes (Signed)
Patient reports posterior neck pain and headache onset last Saturday , denies injury , patient added mild SOB yesterday , patient stated prescription Meloxicam is nor working . Seen at [facility] ER yesterday afternoon for the same complaint .

## 2021-06-01 NOTE — Telephone Encounter (Signed)
Spoke with the patient. He stated he is still in the hospital. He declined an appointment and stated he will reach out to his insurance company to see if they will cover an MRI without an appointment.

## 2021-06-01 NOTE — ED Provider Notes (Signed)
[facility] EMERGENCY DEPARTMENT Provider Note   CSN: [id number] Arrival date & time: 06/01/21  0140     History Chief Complaint  Patient presents with   Neck Pain     Francis Lin is a 45 y.o. male.  HPI    Patient and his wife feel very frustrated with the overall experience they have had so far, they feel like their concerns have been dismissed and they have only been told what could not be done for them, rather than told how they could be helped. Patient is endorsing a tightness in his neck that radiates up into his head making it difficult for him to move his neck and to swallow. Says that he last look meloxicam last night around 11 but has not had anything for the pain since. Has never taken a muscle relaxer before.   Past Medical History:  Diagnosis Date   Acid reflux    Allergy    Anxiety    stress    Clotting disorder ([place])    right leg x 2 in calf and behind knee    DVT (deep venous thrombosis) (HCC)    Esophagitis    Gout    Hypersomnia, persistent    IBS (irritable bowel syndrome)    PONV (postoperative nausea and vomiting)    nausea with no vomiting    Shifting sleep-work schedule, affecting sleep 06/20/2013   Snoring disorder 06/20/2013   Tubular adenoma of colon     Patient Active Problem List   Diagnosis Date Noted   LUQ abdominal pain 11/03/2020   Generalized abdominal pain 11/03/2020   Altered bowel habits 11/03/2020   Nausea without vomiting 11/03/2020   Migraine without aura and without status migrainosus, not intractable 11/22/2018   Nonsustained ventricular tachycardia ([place]) 08/03/2018   Elevated serum creatinine 06/18/2018   Slow heart rate 06/06/2018   Shortness of breath 04/04/2018   Venous insufficiency 01/05/2017   Deep vein thrombosis (DVT) of popliteal vein of right lower extremity ([place]) 06/30/2016   S/P ORIF (open reduction internal fixation) fracture 06/22/2016   Multiple fractures of both lower extremities  05/31/2016   Elevated CK 05/17/2016   Fracture of right fibula 05/17/2016   Fracture of right tibia 05/17/2016   Fracture of tibia, left, closed 05/17/2016   Trauma 05/17/2016   Situational anxiety 08/28/2014   Unspecified vitamin D deficiency 09/22/2013   Shifting sleep-work schedule, affecting sleep 06/20/2013   Snoring disorder 06/20/2013   Hypersomnia, persistent    History of esophageal stricture 04/16/2013   Pain in joint, ankle and foot 04/16/2013   Gout 04/16/2013   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   OBESITY, CLASS II, BMI 35- 39.9 03/15/2012   Hypertriglyceridemia 03/15/2012   Atypical chest pain 12/02/2008   Dysphagia 12/02/2008    Past Surgical History:  Procedure Laterality Date   COLONOSCOPY     HEMORRHOID BANDING     ORIF TIBIA & FIBULA FRACTURES Right    right leg surgery x 2 places- rod in place knee to ankle    UPPER GASTROINTESTINAL ENDOSCOPY     wisdom teeth     wrist surgery Left        Family History  Problem Relation Age of Onset   Lupus Mother    Heart attack Father    Arthritis Father    Hypertension Father    Cancer Father        multiple myeloma   Diabetes Father    Edema Sister  Diabetes Sister    Bipolar disorder Sister    Colon cancer Neg Hx    Colon polyps Neg Hx    Esophageal cancer Neg Hx    Rectal cancer Neg Hx    Stomach cancer Neg Hx     Social History   Tobacco Use   Smoking status: Never   Smokeless tobacco: Never  Vaping Use   Vaping Use: Never used  Substance Use Topics   Alcohol use: No   Drug use: No    Home Medications Prior to Admission medications   Medication Sig Start Date End Date Taking? Authorizing Provider  cyclobenzaprine (FLEXERIL) 10 MG tablet Take 1 tablet (10 mg total) by mouth 2 (two) times daily as needed for muscle spasms. 06/01/21  Yes Lucrezia Starch, MD  acetaminophen (TYLENOL) 325 MG tablet Take 650 mg by mouth every 6 (six) hours as needed for mild pain.    [provider]  aspirin EC 81 MG tablet Take 81 mg by mouth every 6 (six) hours as needed for mild pain.    [provider]  colchicine 0.6 MG tablet Take by mouth as needed. 12/24/18   [provider]  ibuprofen (ADVIL) 600 MG tablet Take 1 tablet (600 mg total) by mouth every 6 (six) hours as needed for mild pain or moderate pain. 06/01/21   Lucrezia Starch, MD  loratadine (CLARITIN) 10 MG tablet Take 10 mg by mouth as needed for allergies.    [provider]  meloxicam (MOBIC) 15 MG tablet Take 15 mg by mouth daily as needed for pain.  02/12/20   [provider]  mometasone (NASONEX) 50 MCG/ACT nasal spray Place 2 sprays into the nose daily. 04/07/21   Burchette, Alinda Sierras, MD  mupirocin ointment (BACTROBAN) 2 % Apply 1 application topically daily. 04/29/21   Raspet, Erin K, PA-C  ondansetron (ZOFRAN) 4 MG tablet Take 1 tablet (4 mg total) by mouth every 8 (eight) hours as needed. 02/24/21   Tegeler, Gwenyth Allegra, MD  OVER THE COUNTER MEDICATION GoutFlex-as needed    [provider]  predniSONE (STERAPRED UNI-PAK 21 TAB) 10 MG (21) TBPK tablet As directed 04/29/21   Raspet, Erin K, PA-C  RABEprazole (ACIPHEX) 20 MG tablet Take 1 tablet (20 mg total) by mouth daily. 04/16/21   Pyrtle, Lajuan Lines, MD  tiZANidine (ZANAFLEX) 4 MG capsule Take 1 capsule (4 mg total) by mouth 3 (three) times daily as needed for muscle spasms. 04/29/21   Raspet, Derry Skill, PA-C  cimetidine (TAGAMET) 200 MG tablet Take by mouth. 01/30/19 01/16/20  [provider]    Allergies    Lactose, Caffeine, Omeprazole, Peanuts [peanut oil], Penicillins, and Seasonal ic [cholestatin]  Review of Systems   Review of Systems  HENT:  Positive for trouble swallowing.   Musculoskeletal:  Positive for myalgias, neck pain and neck stiffness.  All other systems reviewed and are negative.  Physical Exam Updated Vital Signs BP (!) 148/94   Pulse 68   Temp 98.8 F (37.1 C) (Oral)   Resp 15   SpO2 100%    Physical Exam Constitutional:      Appearance: Normal appearance.  HENT:     Head: Normocephalic and atraumatic.  Eyes:     Extraocular Movements: Extraocular movements intact.  Cardiovascular:     Rate and Rhythm: Normal rate.  Pulmonary:     Effort: Pulmonary effort is normal.  Abdominal:     General: Abdomen is flat.  Musculoskeletal:  Cervical back: Tenderness present. Pain with movement and muscular tenderness present.  Skin:    General: Skin is warm and dry.  Neurological:     Mental Status: He is alert.    ED Results / Procedures / Treatments   Labs (all labs ordered are listed, but only abnormal results are displayed) Labs Reviewed - No data to display  EKG None  Radiology DG Chest 2 View  Result Date: 05/31/2021 CLINICAL DATA:  Neck and back pain sat/sun, sob today. EXAM: CHEST - 2 VIEW COMPARISON:  02/24/2021 FINDINGS: Lungs are clear. Heart size and mediastinal contours are within normal limits. No effusion. Visualized bones unremarkable. IMPRESSION: No acute cardiopulmonary disease. Electronically Signed   By: Lucrezia Europe M.D.   On: 05/31/2021 14:26   CT Cervical Spine Wo Contrast  Result Date: 06/01/2021 CLINICAL DATA:  45 year old male with history of chronic posterior neck pain which is worsening. Intermittent left arm and hand numbness. EXAM: CT CERVICAL SPINE WITHOUT CONTRAST TECHNIQUE: Multidetector CT imaging of the cervical spine was performed without intravenous contrast. Multiplanar CT image reconstructions were also generated. COMPARISON:  Cervical spine CT 06/01/2021. FINDINGS: Alignment: Normal. Skull base and vertebrae: No acute fracture. No primary bone lesion or focal pathologic process. Soft tissues and spinal canal: No prevertebral fluid or swelling. No visible canal hematoma. Disc levels: No significant degenerative disc disease or facet arthropathy. Upper chest: Negative. Other: None. IMPRESSION: 1. No acute abnormality of the cervical spine  to account for the patient's symptoms. Electronically Signed   By: Vinnie Langton M.D.   On: 06/01/2021 06:51   CT Cervical Spine Wo Contrast  Result Date: 06/01/2021 CLINICAL DATA:  Cervical radiculopathy EXAM: CT CERVICAL SPINE WITHOUT CONTRAST TECHNIQUE: Multidetector CT imaging of the cervical spine was performed without intravenous contrast. Multiplanar CT image reconstructions were also generated. COMPARISON:  None. FINDINGS: Alignment: Mild cervical kyphosis is likely positional in nature. No listhesis. Skull base and vertebrae: Craniocervical alignment is normal. Atlantal dental interval is not widened. No acute fracture of the cervical spine. Vertebral body height is preserved. No lytic or blastic bone lesion. Soft tissues and spinal canal: No prevertebral fluid or swelling. No visible canal hematoma. Disc levels: Intervertebral disc heights are preserved. Sagittal images demonstrate no thickening of the prevertebral soft tissues. Review of the axial images demonstrates no significant uncovertebral or facet arthrosis. No significant neuroforaminal narrowing or canal stenosis Upper chest: Unremarkable Other: None IMPRESSION: Mild cervical kyphosis is likely positional in nature. Otherwise normal examination. Electronically Signed   By: Fidela Salisbury M.D.   On: 06/01/2021 03:34   CT Renal Stone Study  Result Date: 05/31/2021 CLINICAL DATA:  Lower back pain radiating to right testicle since Saturday EXAM: CT ABDOMEN AND PELVIS WITHOUT CONTRAST TECHNIQUE: Multidetector CT imaging of the abdomen and pelvis was performed following the standard protocol without IV contrast. Unenhanced CT was performed per clinician order. Lack of IV contrast limits sensitivity and specificity, especially for evaluation of abdominal/pelvic solid viscera. COMPARISON:  11/16/2020 FINDINGS: Lower chest: There are 2 left lower lobe pulmonary nodules identified on this exam, new since prior study. 6 mm nodules identified on  image 3 and a 9 mm nodule identified on image 6. Stable subpleural lingular scarring. No effusion or pneumothorax. Hepatobiliary: Unenhanced imaging of the liver and gallbladder demonstrate no abnormalities. No biliary duct dilation. Pancreas: Unremarkable. No pancreatic ductal dilatation or surrounding inflammatory changes. Spleen: Normal in size without focal abnormality. Adrenals/Urinary Tract: Chronic scarring upper pole right kidney. No urinary  tract calculi or obstructive uropathy within either kidney. The adrenals and bladder are unremarkable. Stomach/Bowel: No bowel obstruction or ileus. Normal appendix right lower quadrant. No bowel wall thickening or inflammatory change. Vascular/Lymphatic: Stable nonspecific subcentimeter lymph nodes and associated mesenteric fat stranding within the central upper abdomen, likely sequela of previous inflammation or infection. No pathologic adenopathy within the abdomen or pelvis. No significant vascular findings on this limited unenhanced exam. Reproductive: Prostate is unremarkable. Other: No free fluid or free gas.  No abdominal wall hernia. Musculoskeletal: Minimal subcutaneous fat stranding in the right inguinal region, medial to the inguinal fold, reference image 103, measuring up to 9 mm in size. This is likely focal inflammation or infection. No fluid collection or abscess. No acute or destructive bony lesions. Reconstructed images demonstrate no additional findings. IMPRESSION: 1. Focal subcutaneous inflammatory change in the right inguinal region, measuring up to 9 mm. No fluid collection or abscess. 2. Otherwise no acute intra-abdominal or intrapelvic process. Normal appendix. 3. Interval development of nonspecific subcentimeter left lower lobe pulmonary nodules, likely inflammatory or infectious given development since 11/16/2020. Non-contrast chest CT at 3-6 months is recommended. If the nodules are stable at time of repeat CT, then future CT at 18-24 months  (from today's scan) is considered optional for low-risk patients, but is recommended for high-risk patients. This recommendation follows the consensus statement: Guidelines for Management of Incidental Pulmonary Nodules Detected on CT Images: From the Fleischner Society 2017; Radiology 2017; 284:228-243. Electronically Signed   By: Randa Ngo M.D.   On: 05/31/2021 17:08    Procedures Procedures   Medications Ordered in ED Medications  ketorolac (TORADOL) injection 30 mg (30 mg Intramuscular Given 06/01/21 0906)  cyclobenzaprine (FLEXERIL) tablet 10 mg (10 mg Oral Given 06/01/21 6812)    ED Course  I have reviewed the triage vital signs and the nursing notes.  Pertinent labs & imaging results that were available during my care of the patient were reviewed by me and considered in my medical decision making (see chart for details).    MDM Rules/Calculators/A&P                           Prior CT scan negative for acute intracranial process. Patient does not want to try opioids at this time, but would like to have NSAIDs and try a muscle relaxer. After receiving these medications pain was significantly improved and patient was feeling much better. Will provide patient with a referral to a spine doctor who can discuss further pain management strategies as an outpatient if pain persists. Patient stable for discharge.   Final Clinical Impression(s) / ED Diagnoses Final diagnoses:  Neck pain  Neck muscle spasm    Rx / DC Orders ED Discharge Orders          Ordered    ibuprofen (ADVIL) 600 MG tablet  Every 6 hours PRN        06/01/21 0944    cyclobenzaprine (FLEXERIL) 10 MG tablet  2 times daily PRN        06/01/21 0944             Scarlett Presto, MD 06/01/21 1007    Lucrezia Starch, MD 06/02/21 [phone]

## 2021-06-07 ENCOUNTER — Other Ambulatory Visit: Payer: Self-pay

## 2021-06-07 ENCOUNTER — Ambulatory Visit (INDEPENDENT_AMBULATORY_CARE_PROVIDER_SITE_OTHER): Payer: Managed Care, Other (non HMO) | Admitting: Family Medicine

## 2021-06-07 VITALS — BP 130/84 | HR 75 | Temp 98.1°F | Wt 332.1 lb

## 2021-06-07 DIAGNOSIS — R918 Other nonspecific abnormal finding of lung field: Secondary | ICD-10-CM

## 2021-06-07 DIAGNOSIS — M542 Cervicalgia: Secondary | ICD-10-CM

## 2021-06-07 MED ORDER — DICLOFENAC SODIUM 75 MG PO TBEC: 75.0000 mg | DELAYED_RELEASE_TABLET | Freq: Two times a day (BID) | ORAL | 0 refills | Status: DC

## 2021-06-07 NOTE — Patient Instructions (Signed)
We will set up PT for neck and low back  Will set up CT chest to evaluate lung nodules.

## 2021-06-07 NOTE — Progress Notes (Signed)
Established Patient Office Visit  Subjective:  Patient ID: Francis Lin, male    DOB: March 18, 1976  Age: 45 y.o. MRN: [id number]  CC:  Chief Complaint  Patient presents with   Hospitalization Follow-up    HPI Francis Lin presents for somewhat chronic neck and upper back pain worsening recently.  He actually went to the ER couple times regarding this.  He ended up getting CT cervical spine which showed no acute abnormality.  He was treated with Toradol and Flexeril and did see some improvement.  Has been taking the Flexeril at night does feel like this is helping some but he still has significant neck pain.  He had tried multiple anti-inflammatories including Aleve, ibuprofen and eventually prescription Motrin 600 mg with mild relief.  Occasional radiation of pain down both upper extremities.  Denies any major weakness or numbness.  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  He did recently help lift his son's things from moving in the college but does not recall any specific injury there and states he did minimal lifting that date.  He had recent CT renal study which showed no kidney stones.  There were mention of incidental pulmonary nodules one 6 mm one 9 mm left lower lobe.  Recommendation was for 3 to 46-monthfollow-up.  Patient has never smoked and is considered lower risk.  No recent cough, appetite change, weight loss, hemoptysis.  Past Medical History:  Diagnosis Date   Acid reflux    Allergy    Anxiety    stress    Clotting disorder ([facility])    right leg x 2 in calf and behind knee    DVT (deep venous thrombosis) ([facility])    Esophagitis    Gout    Hypersomnia, persistent    IBS (irritable bowel syndrome)    PONV (postoperative nausea and vomiting)    nausea with no vomiting    Shifting sleep-work schedule, affecting sleep 06/20/2013   Snoring disorder 06/20/2013   Tubular adenoma of colon     Past Surgical History:  Procedure  Laterality Date   COLONOSCOPY     HEMORRHOID BANDING     ORIF TIBIA & FIBULA FRACTURES Right    right leg surgery x 2 places- rod in place knee to ankle    UPPER GASTROINTESTINAL ENDOSCOPY     wisdom teeth     wrist surgery Left     Family History  Problem Relation Age of Onset   Lupus Mother    Heart attack Father    Arthritis Father    Hypertension Father    Cancer Father        multiple myeloma   Diabetes Father    Edema Sister    Diabetes Sister    Bipolar disorder Sister    Colon cancer Neg Hx    Colon polyps Neg Hx    Esophageal cancer Neg Hx    Rectal cancer Neg Hx    Stomach cancer Neg Hx     Social History   Socioeconomic History   Marital status: Married    Spouse name: Not on file   Number of children: 3   Years of education: college   Highest education level: Not on file  Occupational History    Employer: [employer]    Comment: [employer]  Tobacco Use   Smoking status: Never   Smokeless tobacco: Never  Vaping Use   Vaping Use: Never used  Substance and Sexual Activity   Alcohol use: No   Drug use: No   Sexual activity: Not on file  Other Topics Concern   Not on file  Social History Narrative   Not on file   Social Determinants of Health   Financial Resource Strain: Not on file  Food Insecurity: Not on file  Transportation Needs: Not on file  Physical Activity: Not on file  Stress: Not on file  Social Connections: Not on file  Intimate Partner Violence: Not on file    Outpatient Medications Prior to Visit  Medication Sig Dispense Refill   acetaminophen (TYLENOL) 325 MG tablet Take 650 mg by mouth every 6 (six) hours as needed for mild pain.     aspirin EC 81 MG tablet Take 81 mg by mouth every 6 (six) hours as needed for mild pain.     colchicine 0.6 MG tablet Take by mouth as needed.     cyclobenzaprine (FLEXERIL) 10 MG tablet Take 1 tablet (10 mg total) by mouth 2 (two) times daily as needed for muscle spasms. 20 tablet 0    ibuprofen (ADVIL) 600 MG tablet Take 1 tablet (600 mg total) by mouth every 6 (six) hours as needed for mild pain or moderate pain. 30 tablet 0   loratadine (CLARITIN) 10 MG tablet Take 10 mg by mouth as needed for allergies.     mometasone (NASONEX) 50 MCG/ACT nasal spray Place 2 sprays into the nose daily. 1 each 12   mupirocin ointment (BACTROBAN) 2 % Apply 1 application topically daily. 22 g 0   ondansetron (ZOFRAN) 4 MG tablet Take 1 tablet (4 mg total) by mouth every 8 (eight) hours as needed. 12 tablet 0   OVER THE COUNTER MEDICATION GoutFlex-as needed     predniSONE (STERAPRED UNI-PAK 21 TAB) 10 MG (21) TBPK tablet As directed 21 tablet 0   RABEprazole (ACIPHEX) 20 MG tablet Take 1 tablet (20 mg total) by mouth daily. 30 tablet 2   tiZANidine (ZANAFLEX) 4 MG capsule Take 1 capsule (4 mg total) by mouth 3 (three) times daily as needed for muscle spasms. 21 capsule 0   meloxicam (MOBIC) 15 MG tablet Take 15 mg by mouth daily as needed for pain.      No facility-administered medications prior to visit.    Allergies  Allergen Reactions   Lactose Diarrhea and Other (See Comments)    Lactose intolerance Lactose intolerance Lactose intolerance   Caffeine Other (See Comments)    "makes feel bad", headaches   Omeprazole Other (See Comments)    Mood swing   Peanuts [Peanut Oil]    Penicillins Other (See Comments)    Headaches    Seasonal Ic [Cholestatin]     Sneezing,runny nose , itchy eyes    ROS Review of Systems  Constitutional:  Negative for appetite change, chills, fever and unexpected weight change.  Respiratory:  Negative for cough and shortness of breath.   Cardiovascular:  Negative for chest pain.  Musculoskeletal:  Positive for neck pain and neck stiffness.  Neurological:  Negative for weakness.     Objective:    Physical Exam Vitals reviewed.  Constitutional:      Appearance: Normal appearance.  Neck:     Comments: Has some muscular tenderness upper back  bilaterally. Cardiovascular:     Rate and Rhythm: Normal rate and regular rhythm.  Pulmonary:     Effort: Pulmonary effort is normal.     Breath sounds: Normal breath sounds.  Musculoskeletal:  Cervical back: Neck supple.  Neurological:     Mental Status: He is alert.     Comments: Full strength upper extremities with symmetric reflexes.    BP 130/84 (BP Location: Left Arm, Patient Position: Sitting, Cuff Size: Normal)   Pulse 75   Temp 98.1 F (36.7 C) (Oral)   Wt (!) 332 lb 1.6 oz (150.6 kg)   SpO2 99%   BMI 41.51 kg/m  Wt Readings from Last 3 Encounters:  06/07/21 (!) 332 lb 1.6 oz (150.6 kg)  05/31/21 230 lb (104.3 kg)  04/29/21 (!) 330 lb (149.7 kg)     Health Maintenance Due  Topic Date Due   HIV Screening  Never done   Hepatitis C Screening  Never done   INFLUENZA VACCINE  05/10/2021    There are no preventive care reminders to display for this patient.  Lab Results  Component Value Date   TSH 2.24 02/22/2021   Lab Results  Component Value Date   WBC 7.3 05/31/2021   HGB 15.7 05/31/2021   HCT 46.9 05/31/2021   MCV 88.7 05/31/2021   PLT 273 05/31/2021   Lab Results  Component Value Date   NA 136 05/31/2021   K 4.1 05/31/2021   CO2 26 05/31/2021   GLUCOSE 87 05/31/2021   BUN 12 05/31/2021   CREATININE 1.31 (H) 05/31/2021   BILITOT 0.6 02/22/2021   ALKPHOS 76 02/22/2021   AST 13 02/22/2021   ALT 17 02/22/2021   PROT 6.9 02/22/2021   ALBUMIN 4.0 02/22/2021   CALCIUM 8.8 (L) 05/31/2021   ANIONGAP 8 05/31/2021   GFR 53.22 (L) 02/22/2021   Lab Results  Component Value Date   CHOL 128 02/17/2020   Lab Results  Component Value Date   HDL 33.30 (L) 02/17/2020   Lab Results  Component Value Date   LDLCALC 80 02/17/2020   Lab Results  Component Value Date   TRIG 76.0 02/17/2020   Lab Results  Component Value Date   CHOLHDL 4 02/17/2020   Lab Results  Component Value Date   HGBA1C 5.8 02/22/2021      Assessment & Plan:    Problem List Items Addressed This Visit   None Visit Diagnoses     Cervical pain (neck)    -  Primary   Relevant Orders   Ambulatory referral to Physical Therapy   Pulmonary nodules       Relevant Orders   CT Chest Wo Contrast     -Patient has somewhat chronic cervical neck pain with recent unremarkable CT cervical spine.  He actually was given referral to neurosurgeon from ER.  Feel that he would benefit from some physical therapy at this time and this was set up.  We also discussed trial of diclofenac 75 mg by mouth twice daily with food.  Reviewed potential side effects.  -Incidentally noted pulmonary nodules left lower lobe.  Setting up 2-monthfollow-up for CT chest without contrast to further evaluate  Meds ordered this encounter  Medications   diclofenac (VOLTAREN) 75 MG EC tablet    Sig: Take 1 tablet (75 mg total) by mouth 2 (two) times daily.    Dispense:  60 tablet    Refill:  0    Follow-up: No follow-ups on file.    BCarolann Littler MD

## 2021-06-15 ENCOUNTER — Other Ambulatory Visit: Payer: Self-pay

## 2021-06-15 ENCOUNTER — Ambulatory Visit (INDEPENDENT_AMBULATORY_CARE_PROVIDER_SITE_OTHER)
Admission: RE | Admit: 2021-06-15 | Discharge: 2021-06-15 | Disposition: A | Discharge status: Home / Self Care | Payer: Managed Care, Other (non HMO) | Source: Ambulatory Visit | Attending: Family Medicine | Admitting: Family Medicine

## 2021-06-15 DIAGNOSIS — R918 Other nonspecific abnormal finding of lung field: Secondary | ICD-10-CM | POA: Diagnosis not present

## 2021-06-16 NOTE — Addendum Note (Signed)
Addended by: [name] on: 06/16/2021 11:15 PM   Modules accepted: Orders

## 2021-06-18 ENCOUNTER — Telehealth: Payer: Self-pay

## 2021-06-18 NOTE — Telephone Encounter (Signed)
Patient called stating that he has been taking predniSONE (STERAPRED UNI-PAK 21 TAB) 10 MG (21) TBPK tablet and said that it is making him feel unstable pt would like to know if he should continue taking. Please advise

## 2021-06-18 NOTE — Telephone Encounter (Signed)
Per Dr. Elease Hashimoto the patient should stop taking the prednisone.

## 2021-06-18 NOTE — Telephone Encounter (Signed)
Spoke with the patient. He is aware to stop this medication.

## 2021-07-07 ENCOUNTER — Telehealth: Payer: Managed Care, Other (non HMO) | Admitting: Internal Medicine

## 2021-07-22 IMAGING — DX DG CHEST 2V
2 series · 2 of 2 positions shown · non-contrast
Comparison: 04/28/2019

CLINICAL DATA: Upper chest pain

EXAM:
CHEST - 2 VIEW

[chest pa]
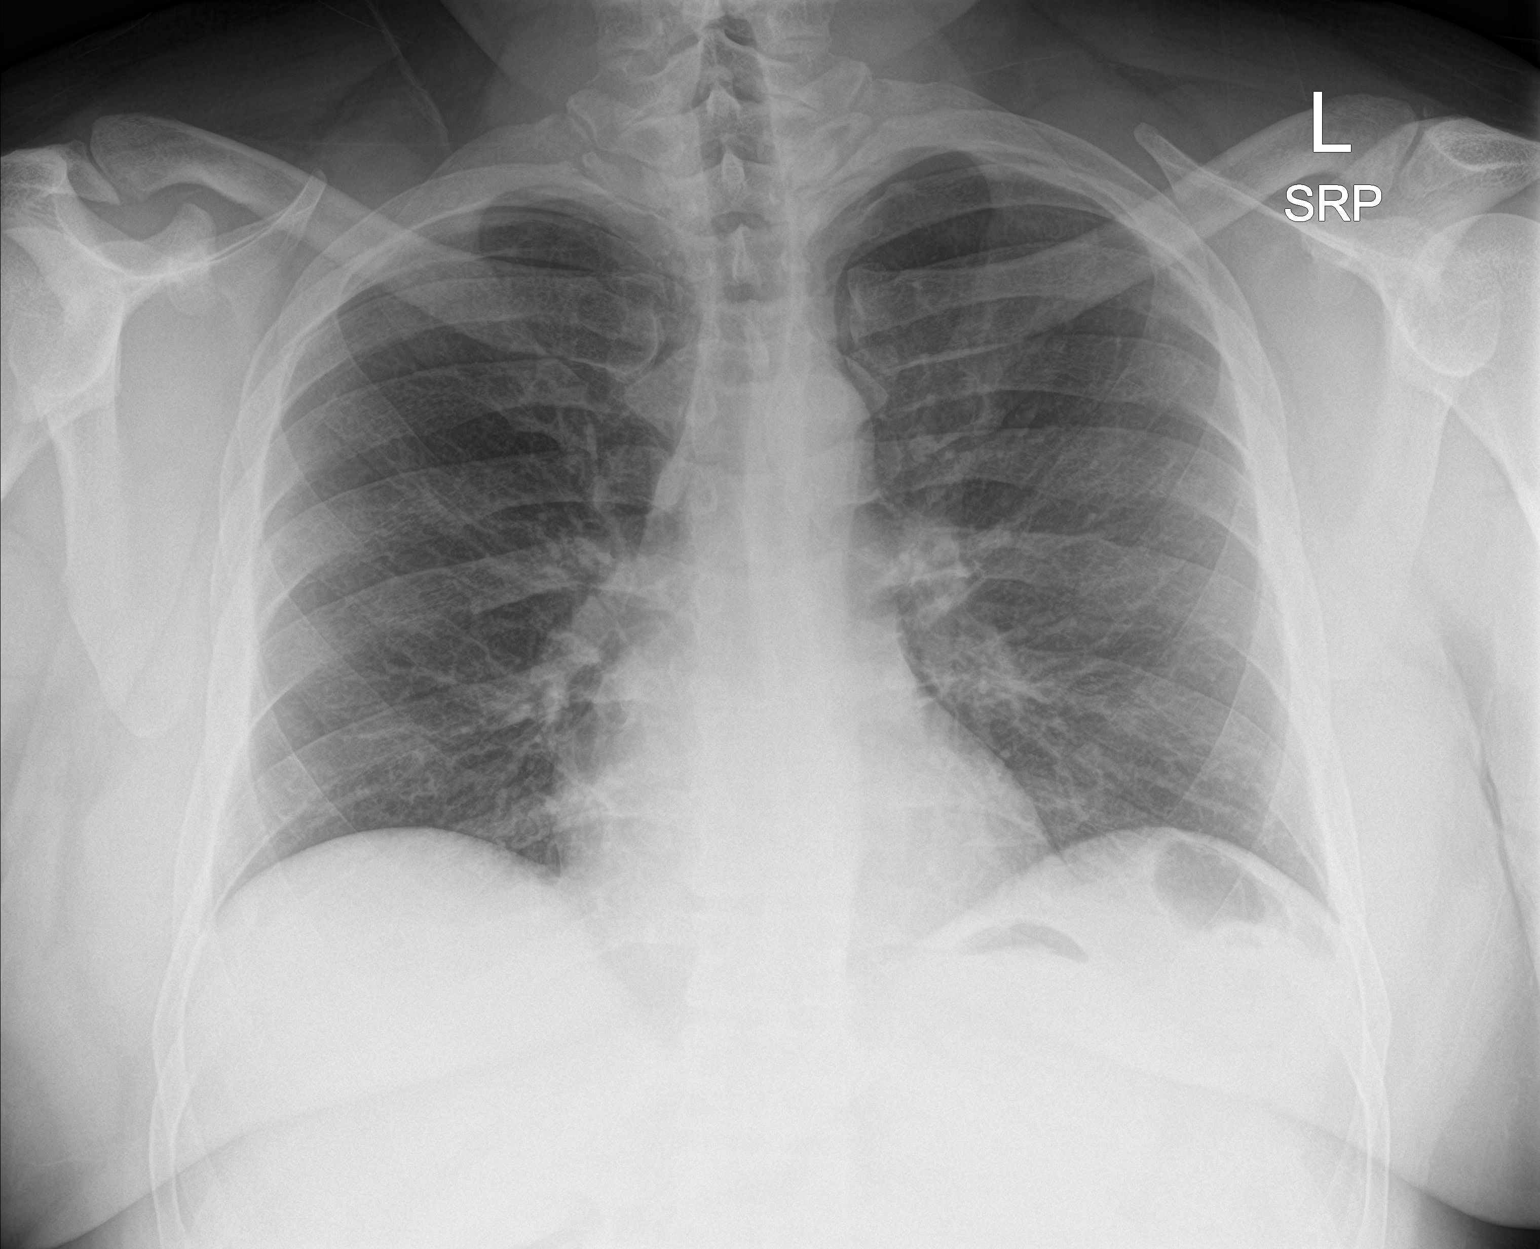

[chest lat]
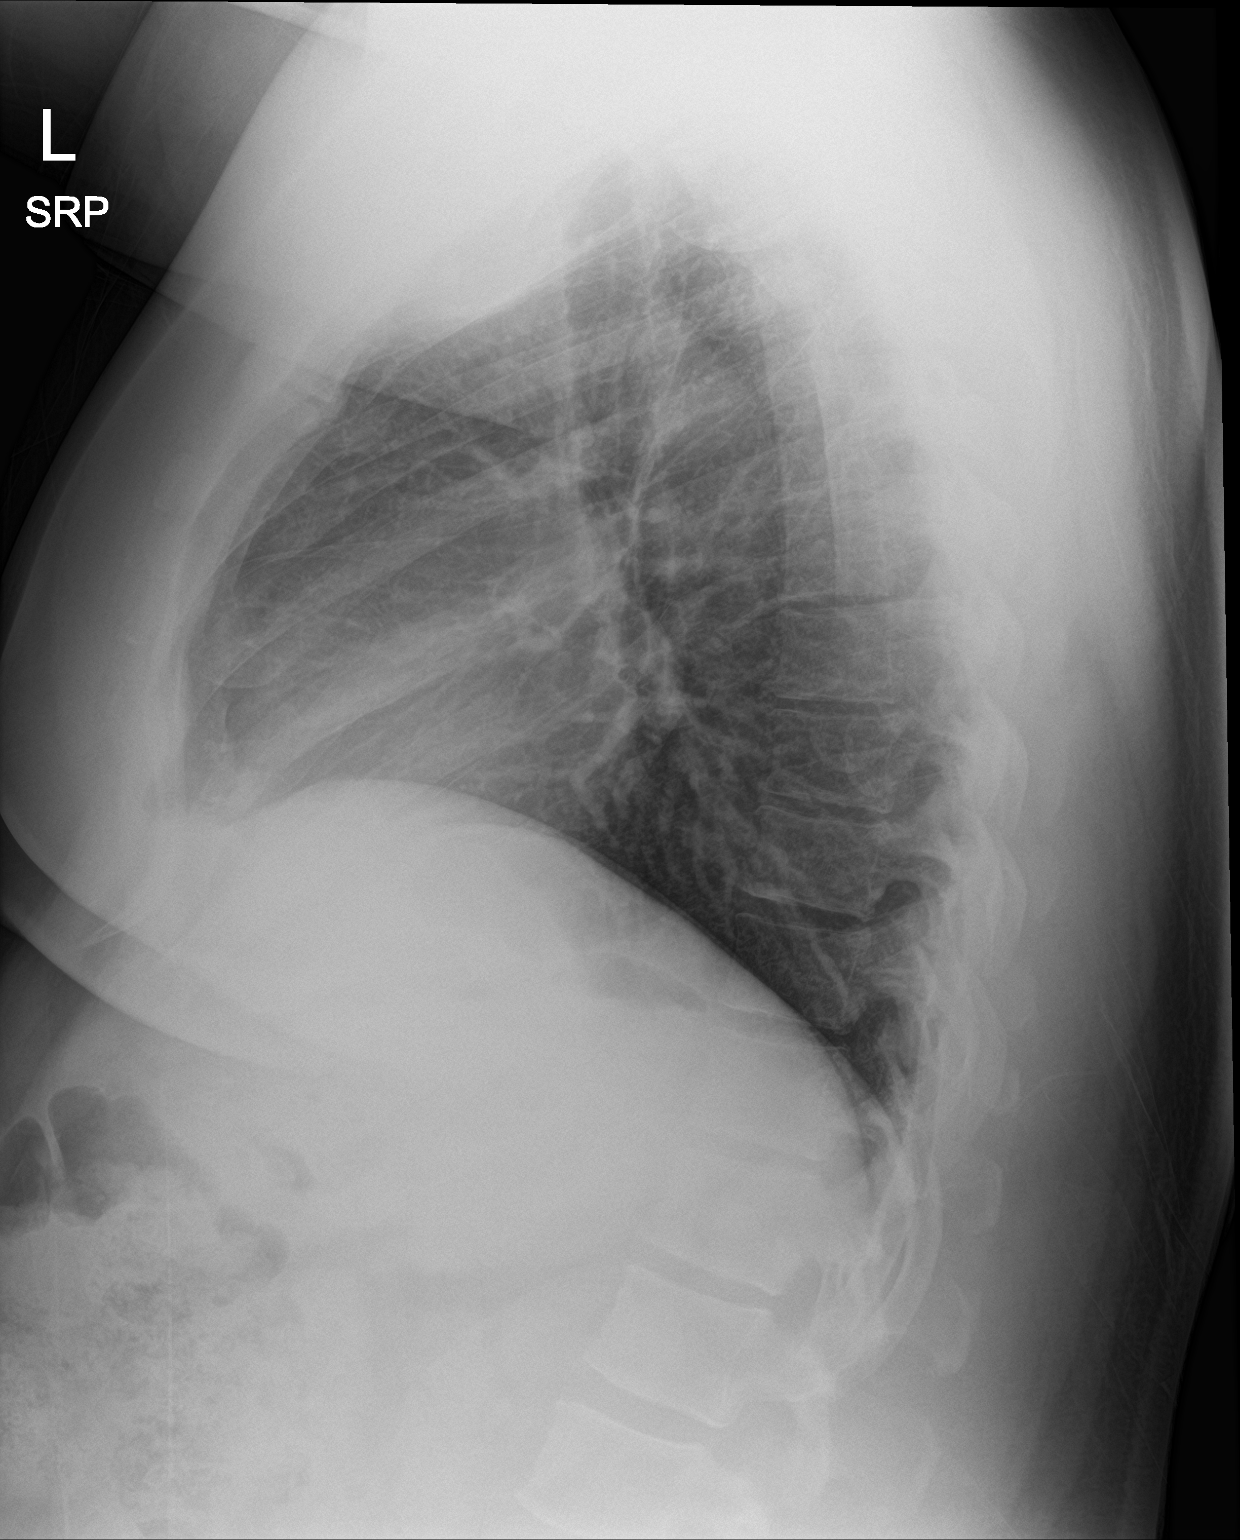

[2 of 2 positions shown; findings below may reference images not displayed]

FINDINGS: The heart size and mediastinal contours are within normal limits.
Both lungs are clear. The visualized skeletal structures are
unremarkable.
IMPRESSION: No active cardiopulmonary disease.

## 2021-08-16 ENCOUNTER — Telehealth: Payer: Self-pay | Admitting: Family Medicine

## 2021-08-16 MED ORDER — COLCHICINE 0.6 MG PO TABS: 0.6000 mg | ORAL_TABLET | ORAL | 1 refills | Status: DC | PRN

## 2021-08-16 NOTE — Telephone Encounter (Signed)
Last filled in 2020. [place] for a refill?

## 2021-08-16 NOTE — Telephone Encounter (Signed)
Patient called to get refill on colchicine 0.6 MG tablet because he is having a gout flare up       Please send to  [facility], [address] [facility] [address] Phone:  [phone]  Fax:  [phone]            Good callback number is [phone]     Please advise

## 2021-08-16 NOTE — Telephone Encounter (Signed)
Rx has been sne tin. Patient has been made aware.

## 2021-09-06 ENCOUNTER — Ambulatory Visit (INDEPENDENT_AMBULATORY_CARE_PROVIDER_SITE_OTHER): Payer: Managed Care, Other (non HMO) | Admitting: Family Medicine

## 2021-09-06 VITALS — BP 134/72 | HR 78 | Temp 98.3°F | Wt 334.5 lb

## 2021-09-06 DIAGNOSIS — M25532 Pain in left wrist: Secondary | ICD-10-CM | POA: Diagnosis not present

## 2021-09-06 DIAGNOSIS — M109 Gout, unspecified: Secondary | ICD-10-CM

## 2021-09-06 NOTE — Progress Notes (Signed)
Established Patient Office Visit  Subjective:  Patient ID: Francis Lin, male    DOB: [birth year]  Age: 45 y.o. MRN: [id number]  CC:  Chief Complaint  Patient presents with   Referral    HPI Francis Lin is here basically requesting rheumatology referral.  He has history of presumptive diagnosis of gout.  Has had MTP joint involvement the past.  Recent flareup a few weeks ago of left wrist pain.  He describes some swelling and pain and warmth of the left wrist.  This is gradually improving with colchicine.  Elevated uric acid 9.1 last check.  He is not on prophylaxis.  To our knowledge he has not had arthrocentesis for crystal proven gout but very likely clinically.  He has multiple other joint pains including some recent left shoulder pain and also frequent low back pains.  He has had at least on a couple occasions screenings for inflammatory arthritis with test such as CCP antibody, ANA, rheumatoid factor and these have been negative.  Past Medical History:  Diagnosis Date   Acid reflux    Allergy    Anxiety    stress    Clotting disorder (HCC)    right leg x 2 in calf and behind knee    DVT (deep venous thrombosis) (HCC)    Esophagitis    Gout    Hypersomnia, persistent    IBS (irritable bowel syndrome)    PONV (postoperative nausea and vomiting)    nausea with no vomiting    Shifting sleep-work schedule, affecting sleep 06/20/2013   Snoring disorder 06/20/2013   Tubular adenoma of colon     Past Surgical History:  Procedure Laterality Date   COLONOSCOPY     HEMORRHOID BANDING     ORIF TIBIA & FIBULA FRACTURES Right    right leg surgery x 2 places- rod in place knee to ankle    UPPER GASTROINTESTINAL ENDOSCOPY     wisdom teeth     wrist surgery Left     Family History  Problem Relation Age of Onset   Lupus Mother    Heart attack Father    Arthritis Father    Hypertension Father    Cancer Father        multiple myeloma   Diabetes Father    Edema  Sister    Diabetes Sister    Bipolar disorder Sister    Colon cancer Neg Hx    Colon polyps Neg Hx    Esophageal cancer Neg Hx    Rectal cancer Neg Hx    Stomach cancer Neg Hx     Social History   Socioeconomic History   Marital status: Married    Spouse name: Not on file   Number of children: 3   Years of education: college   Highest education level: Not on file  Occupational History    Employer: [employer]    Comment: [employer]  Tobacco Use   Smoking status: Never   Smokeless tobacco: Never  Vaping Use   Vaping Use: Never used  Substance and Sexual Activity   Alcohol use: No   Drug use: No   Sexual activity: Not on file  Other Topics Concern   Not on file  Social History Narrative   Not on file   Social Determinants of Health   Financial Resource Strain: Not on file  Food Insecurity: Not on file  Transportation Needs: Not on file  Physical Activity: Not on file  Stress: Not  on file  Social Connections: Not on file  Intimate Partner Violence: Not on file    Outpatient Medications Prior to Visit  Medication Sig Dispense Refill   acetaminophen (TYLENOL) 325 MG tablet Take 650 mg by mouth every 6 (six) hours as needed for mild pain.     aspirin EC 81 MG tablet Take 81 mg by mouth every 6 (six) hours as needed for mild pain.     colchicine 0.6 MG tablet Take 1 tablet (0.6 mg total) by mouth as needed. 30 tablet 1   cyclobenzaprine (FLEXERIL) 10 MG tablet Take 1 tablet (10 mg total) by mouth 2 (two) times daily as needed for muscle spasms. 20 tablet 0   diclofenac (VOLTAREN) 75 MG EC tablet Take 1 tablet (75 mg total) by mouth 2 (two) times daily. 60 tablet 0   ibuprofen (ADVIL) 600 MG tablet Take 1 tablet (600 mg total) by mouth every 6 (six) hours as needed for mild pain or moderate pain. 30 tablet 0   loratadine (CLARITIN) 10 MG tablet Take 10 mg by mouth as needed for allergies.     mometasone (NASONEX) 50 MCG/ACT nasal spray Place 2 sprays into the  nose daily. 1 each 12   mupirocin ointment (BACTROBAN) 2 % Apply 1 application topically daily. 22 g 0   ondansetron (ZOFRAN) 4 MG tablet Take 1 tablet (4 mg total) by mouth every 8 (eight) hours as needed. 12 tablet 0   OVER THE COUNTER MEDICATION GoutFlex-as needed     predniSONE (STERAPRED UNI-PAK 21 TAB) 10 MG (21) TBPK tablet As directed 21 tablet 0   RABEprazole (ACIPHEX) 20 MG tablet Take 1 tablet (20 mg total) by mouth daily. 30 tablet 2   tiZANidine (ZANAFLEX) 4 MG capsule Take 1 capsule (4 mg total) by mouth 3 (three) times daily as needed for muscle spasms. 21 capsule 0   No facility-administered medications prior to visit.    Allergies  Allergen Reactions   Lactose Diarrhea and Other (See Comments)    Lactose intolerance Lactose intolerance Lactose intolerance   Caffeine Other (See Comments)    "makes feel bad", headaches   Omeprazole Other (See Comments)    Mood swing   Peanuts [Peanut Oil]    Penicillins Other (See Comments)    Headaches    Seasonal Ic [Cholestatin]     Sneezing,runny nose , itchy eyes    ROS Review of Systems  Constitutional:  Negative for chills and fever.  Respiratory:  Negative for shortness of breath.   Cardiovascular:  Negative for chest pain.  Musculoskeletal:  Positive for arthralgias and back pain.  Skin:  Negative for rash.  Hematological:  Negative for adenopathy.     Objective:    Physical Exam Vitals reviewed.  Cardiovascular:     Rate and Rhythm: Normal rate and regular rhythm.  Pulmonary:     Effort: Pulmonary effort is normal.     Breath sounds: Normal breath sounds.  Musculoskeletal:     Comments: Left wrist does appear to be slightly swollen compared to the right.  No appreciable warmth.  No erythema.  No visible effusion.  No localized tenderness.  Neurological:     Mental Status: He is alert.    BP 134/72 (BP Location: Left Arm, Patient Position: Sitting, Cuff Size: Normal)   Pulse 78   Temp 98.3 F (36.8 C)  (Oral)   Wt (!) 334 lb 8 oz (151.7 kg)   SpO2 99%   BMI 41.81 kg/m  Wt Readings from Last 3 Encounters:  09/06/21 (!) 334 lb 8 oz (151.7 kg)  06/07/21 (!) 332 lb 1.6 oz (150.6 kg)  05/31/21 230 lb (104.3 kg)     Health Maintenance Due  Topic Date Due   HIV Screening  Never done   Hepatitis C Screening  Never done   COVID-19 Vaccine (4 - Booster for Pfizer series) 01/04/2021   INFLUENZA VACCINE  Never done    There are no preventive care reminders to display for this patient.  Lab Results  Component Value Date   TSH 2.24 02/22/2021   Lab Results  Component Value Date   WBC 7.3 05/31/2021   HGB 15.7 05/31/2021   HCT 46.9 05/31/2021   MCV 88.7 05/31/2021   PLT 273 05/31/2021   Lab Results  Component Value Date   NA 136 05/31/2021   K 4.1 05/31/2021   CO2 26 05/31/2021   GLUCOSE 87 05/31/2021   BUN 12 05/31/2021   CREATININE 1.31 (H) 05/31/2021   BILITOT 0.6 02/22/2021   ALKPHOS 76 02/22/2021   AST 13 02/22/2021   ALT 17 02/22/2021   PROT 6.9 02/22/2021   ALBUMIN 4.0 02/22/2021   CALCIUM 8.8 (L) 05/31/2021   ANIONGAP 8 05/31/2021   GFR 53.22 (L) 02/22/2021   Lab Results  Component Value Date   CHOL 128 02/17/2020   Lab Results  Component Value Date   HDL 33.30 (L) 02/17/2020   Lab Results  Component Value Date   LDLCALC 80 02/17/2020   Lab Results  Component Value Date   TRIG 76.0 02/17/2020   Lab Results  Component Value Date   CHOLHDL 4 02/17/2020   Lab Results  Component Value Date   HGBA1C 5.8 02/22/2021      Assessment & Plan:   #1 recent acute monoarticular arthritis involving left wrist.  Presumed history of gout.  Suspect this probably is related to gout flare.  This is gradually improving with colchicine.  Recent uric acid 9.1.  -We discussed definitive diagnosis of gout and also discussed possible prophylactic treatment but at this point he would like to go and see rheumatologist first.  We will set up referral.  Discussed low  purine diet.  #2 chronic intermittent low back pain.  Patient requesting rheumatology referral.  We will set this up.   No orders of the defined types were placed in this encounter.   Follow-up: No follow-ups on file.    Carolann Littler, MD

## 2021-09-06 NOTE — Patient Instructions (Signed)
We will set rheumatology referral.

## 2021-09-14 ENCOUNTER — Telehealth: Payer: Self-pay | Admitting: Internal Medicine

## 2021-09-14 NOTE — Telephone Encounter (Signed)
Inbound call from patient. Went to Urgent care 12/5 due to sharp abd pain starting from Kidney to liver to groin area with over 30 minutes of intense pain. Urgent Care suggested patient go to ED for CT scan. Patient would like Dr. Hilarie Fredrickson to review notes from Urgent care and refer CT instead of going directly to ED. Best contact number [phone] or wife [phone]

## 2021-09-14 NOTE — Telephone Encounter (Signed)
Discussed with pt that Dr. Hilarie Fredrickson is not in the office this afternoon and that they should follow the recommendations from urgent care. Suggested [facility] so they would not have as long a wait.

## 2021-10-18 ENCOUNTER — Emergency Department (HOSPITAL_BASED_OUTPATIENT_CLINIC_OR_DEPARTMENT_OTHER): Payer: Managed Care, Other (non HMO)

## 2021-10-18 ENCOUNTER — Other Ambulatory Visit: Payer: Self-pay

## 2021-10-18 ENCOUNTER — Encounter (HOSPITAL_BASED_OUTPATIENT_CLINIC_OR_DEPARTMENT_OTHER): Payer: Self-pay

## 2021-10-18 ENCOUNTER — Emergency Department (HOSPITAL_BASED_OUTPATIENT_CLINIC_OR_DEPARTMENT_OTHER)
Admission: EM | Admit: 2021-10-18 | Discharge: 2021-10-18 | Disposition: A | Discharge status: Home / Self Care | Payer: Managed Care, Other (non HMO) | Attending: Emergency Medicine | Admitting: Emergency Medicine

## 2021-10-18 DIAGNOSIS — M79604 Pain in right leg: Secondary | ICD-10-CM | POA: Insufficient documentation

## 2021-10-18 NOTE — ED Triage Notes (Signed)
Patient here POV from Home with Right Leg Pain.  Patient states He has been having Pain to Right Knee and Right Lower Leg for approximately 3-4 days.   No other Discernable Symptoms.   NAD Noted during Triage. A&Ox4. GCS 15. Ambulatory.

## 2021-10-18 NOTE — ED Notes (Signed)
Patient returned from US.

## 2021-10-18 NOTE — ED Notes (Signed)
RN provided AVS using Teachback Method. Patient verbalizes understanding of Discharge Instructions. Opportunity for Questioning and Answers were provided by RN. Patient Discharged from ED ambulatory to Home via Self.  

## 2021-10-18 NOTE — ED Provider Notes (Signed)
Emergency Department Provider Note   I have reviewed the triage vital signs and the nursing notes.   HISTORY  Chief Complaint Leg Pain   HPI Francis Lin is a 46 y.o. male with remote history of DVT, thought to be provoked by injury/surgery, presents to the emergency department with pain in the right leg and swelling.  Symptoms developing over the past 2 to 3 days.  He is not having any associated chest pain or shortness of breath.  No redness to the leg.  No fevers.  No joint pain or swelling.  No pain into the hip, lower abdomen, back.  He was on anticoagulation for several months and then this was discontinued due to feeling that the DVT had been provoked.  He is no longer compliant with aspirin.    Past Medical History:  Diagnosis Date   Acid reflux    Allergy    Anxiety    stress    Clotting disorder (HCC)    right leg x 2 in calf and behind knee    DVT (deep venous thrombosis) (HCC)    Esophagitis    Gout    Hypersomnia, persistent    IBS (irritable bowel syndrome)    PONV (postoperative nausea and vomiting)    nausea with no vomiting    Shifting sleep-work schedule, affecting sleep 06/20/2013   Snoring disorder 06/20/2013   Tubular adenoma of colon     Review of Systems  Constitutional: No fever/chills Eyes: No visual changes. ENT: No sore throat. Cardiovascular: Denies chest pain. Respiratory: Denies shortness of breath. Gastrointestinal: No abdominal pain.  No nausea, no vomiting.  No diarrhea.  No constipation. Genitourinary: Negative for dysuria. Musculoskeletal: Negative for back pain. Positive right leg pain and swelling.  Skin: Negative for rash. Neurological: Negative for headaches, focal weakness or numbness.  10-point ROS otherwise negative.  ____________________________________________   PHYSICAL EXAM:  VITAL SIGNS: ED Triage Vitals  Enc Vitals Group     BP 10/18/21 0926 130/81     Pulse Rate 10/18/21 0926 76     Resp 10/18/21  0926 16     Temp 10/18/21 0926 97.9 F (36.6 C)     Temp src --      SpO2 10/18/21 0926 100 %     Weight 10/18/21 0920 (!) 334 lb 7 oz (151.7 kg)     Height 10/18/21 0920 6\' 3"  (1.905 m)   Constitutional: Alert and oriented. Well appearing and in no acute distress. Eyes: Conjunctivae are normal. Head: Atraumatic. Nose: No congestion/rhinnorhea.   Neck: No stridor.  Cardiovascular: Normal rate, regular rhythm. Good peripheral circulation. Grossly normal heart sounds. Normal pulses in the bilateral LEs.  Respiratory: Normal respiratory effort.   Gastrointestinal: No distention.  Musculoskeletal: No lower extremity tenderness nor edema. No gross deformities of extremities. No cellulitis or abscess. No joint swelling or redness.  Neurologic:  Normal speech and language. No gross focal neurologic deficits are appreciated. Normal strength and sensation in the lower extremities.  Skin:  Skin is warm, dry and intact. No rash noted.  ____________________________________________  RADIOLOGY  DVT US independently interpreted without evidence of DVT.   ____________________________________________   PROCEDURES  Procedure(s) performed:   Procedures  None ____________________________________________   INITIAL IMPRESSION / ASSESSMENT AND PLAN / ED COURSE  Pertinent labs & imaging results that were available during my care of the patient were reviewed by me and considered in my medical decision making (see chart for details).   This patient  is Presenting for Evaluation of leg pain, which does require a range of treatment options, and is a complaint that involves a high risk of morbidity and mortality.  The Differential Diagnoses include DVT, ischemic limb, cellulitis, fracture, dislocation, septic joint.  Critical Interventions- DVT US    Reassessment after intervention: No evidence of acute DVT. Exam not consistent with critical limb ischemia.   I decided to review pertinent  External Data, and in summary no recent DVT imaging.   Clinical Laboratory Tests considered: Considered lab testing to further evaluate for renal function and potential infectious process but with unilateral swelling and no outside signs/symptoms to suggest infection decided to defer.   Radiologic Tests Ordered, included DVT US. Imaging independently interpreted by me. No evidence of DVT.   Reevaluation with update and discussion with patient. No DVT on [place]. Plan for compression, elevation, and further PCP follow up.  Medical Decision Making: Summary:  Patient with mild right leg swelling and some tightness. Not consistent with compartment syndrome, cellulitis, critical limb ischemia, or septic joint. Plan for compression, elevation, and PCP follow up.   Disposition: Discharge  ____________________________________________  FINAL CLINICAL IMPRESSION(S) / ED DIAGNOSES  Final diagnoses:  Right leg pain     Note:  This document was prepared using Dragon voice recognition software and may include unintentional dictation errors.  [name], MD, [facility]    Herve Haug, [facility], MD 10/20/21 [phone]

## 2021-10-18 NOTE — Discharge Instructions (Signed)
You were seen in the emergency room today with leg pain.  Your ultrasound did not show a blood clot in the leg to explain your symptoms.  I would advise keeping your follow-up appointment with the rheumatologist.  I have also listed the name of an orthopedic doctor for follow-up.  Please coordinate closely with your primary care doctor.

## 2021-10-19 NOTE — Progress Notes (Addendum)
Office Visit Note  Patient: Francis Lin             Date of Birth: [birth year]           MRN: [id number]             PCP: [name], MD Referring: [name], MD Visit Date: 11/02/2021 Occupation: '@GUAROCC' @  Subjective:  Pain in multiple joints  History of Present Illness: Francis Lin is a 46 y.o. male seen in consultation per request of Dr. Elease Hashimoto.  According the patient he has had history of pain in his feet for the last 5 years.  He is taking anti-inflammatories over the years.  The pain would come in its heels and sometimes in his big toes.  He had a crush injury to his right lower extremity in 2017 which required reconstruction of the tibia and fibula.  He also had a DVT at the time.  He states since then his gout flares have been more frequent.  He was diagnosed with gout in 2015 and was given colchicine which he takes only on as needed basis because it causes headaches and mood changes.  He was offered allopurinol but it was never started.  He has been having increased pain and discomfort in multiple joints.  He states over the last 2 months he has been having pain in his shoulders, elbows, hands, knees and lower back.  He has had lower back pain for many years.  He has seen swelling in his bilateral hands, bilateral feet.  He states he has also had rash in his axillary and inguinal region and sometimes all over his body.  He has not seen a dermatologist and would like a referral to a dermatologist.  He also had recent CT scan of his chest which showed some pulmonary nodules.  He will have repeat CT scan.  There is family history of lupus in his mother.  His father had gout.  Activities of Daily Living:  Patient reports morning stiffness for 30 minutes.   Patient Reports nocturnal pain.  Difficulty dressing/grooming: Reports Difficulty climbing stairs: Reports Difficulty getting out of chair: Reports Difficulty using hands for taps, buttons, cutlery,  and/or writing: Denies  Review of Systems  Constitutional:  Negative for fatigue.  HENT:  Positive for mouth dryness. Negative for mouth sores and nose dryness.   Eyes:  Positive for pain, itching and dryness.  Respiratory:  Positive for shortness of breath. Negative for difficulty breathing.   Cardiovascular:  Positive for chest pain. Negative for palpitations.  Gastrointestinal:  Negative for blood in stool, constipation and diarrhea.  Endocrine: Positive for increased urination.  Genitourinary:  Positive for difficulty urinating.  Musculoskeletal:  Positive for joint pain, joint pain, joint swelling, myalgias, morning stiffness, muscle tenderness and myalgias.  Skin:  Positive for color change and rash. Negative for sensitivity to sunlight.  Allergic/Immunologic: Negative for susceptible to infections.  Neurological:  Positive for dizziness, numbness, headaches and parasthesias. Negative for weakness.  Hematological:  Negative for bruising/bleeding tendency.  Psychiatric/Behavioral:  Negative for confusion.    PMFS History:  Patient Active Problem List   Diagnosis Date Noted   LUQ abdominal pain 11/03/2020   Generalized abdominal pain 11/03/2020   Altered bowel habits 11/03/2020   Nausea without vomiting 11/03/2020   Migraine without aura and without status migrainosus, not intractable 11/22/2018   Nonsustained ventricular tachycardia 08/03/2018   Elevated serum creatinine 06/18/2018   Slow heart rate 06/06/2018   Shortness  of breath 04/04/2018   Venous insufficiency 01/05/2017   Deep vein thrombosis (DVT) of popliteal vein of right lower extremity ([place]) 06/30/2016   S/P ORIF (open reduction internal fixation) fracture 06/22/2016   Multiple fractures of both lower extremities 05/31/2016   Elevated CK 05/17/2016   Fracture of right fibula 05/17/2016   Fracture of right tibia 05/17/2016   Fracture of tibia, left, closed 05/17/2016   Trauma 05/17/2016   Situational anxiety  08/28/2014   Unspecified vitamin D deficiency 09/22/2013   Shifting sleep-work schedule, affecting sleep 06/20/2013   Snoring disorder 06/20/2013   Hypersomnia, persistent    History of esophageal stricture 04/16/2013   Pain in joint, ankle and foot 04/16/2013   Gout 04/16/2013   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   OBESITY, CLASS II, BMI 35- 39.9 03/15/2012   Hypertriglyceridemia 03/15/2012   Atypical chest pain 12/02/2008   Dysphagia 12/02/2008    Past Medical History:  Diagnosis Date   Acid reflux    Allergy    Anxiety    stress    Clotting disorder ([place])    right leg x 2 in calf and behind knee    DVT (deep venous thrombosis) ([facility])    Esophagitis    Gout    Hypersomnia, persistent    IBS (irritable bowel syndrome)    PONV (postoperative nausea and vomiting)    nausea with no vomiting    Shifting sleep-work schedule, affecting sleep 06/20/2013   Snoring disorder 06/20/2013   Tubular adenoma of colon     Family History  Problem Relation Age of Onset   Lupus Mother    Heart attack Father    Arthritis Father    Hypertension Father    Cancer Father        multiple myeloma   Diabetes Father    Atrial fibrillation Father    Gout Father    Edema Sister    Diabetes Sister    Bipolar disorder Sister    Stroke Sister    Obesity Sister    Adrenal disorder Sister    Lupus Maternal Aunt    Eczema Daughter    ADD / ADHD Daughter    Asthma Daughter    Eczema Daughter    ADD / ADHD Daughter    Hypotension Daughter    Eczema Daughter    High Cholesterol Daughter    Colon cancer Neg Hx    Colon polyps Neg Hx    Esophageal cancer Neg Hx    Rectal cancer Neg Hx    Stomach cancer Neg Hx    Past Surgical History:  Procedure Laterality Date   COLONOSCOPY     ESOPHAGEAL DILATION     HEMORRHOID BANDING     ORIF TIBIA & FIBULA FRACTURES Right    right leg surgery x 2 places- rod in place knee to ankle    UPPER GASTROINTESTINAL ENDOSCOPY     wisdom teeth     wrist  surgery Right    Social History   Social History Narrative   Not on file   Immunization History  Administered Date(s) Administered   PFIZER(Purple Top)SARS-COV-2 Vaccination 12/19/2019, 01/09/2020, 11/09/2020   Tdap 08/19/2011, 05/17/2016     Objective: Vital Signs: BP (!) 142/93 (BP Location: Right Arm, Patient Position: Sitting, Cuff Size: Large)    Pulse 69    Ht 6' 2.5" (1.892 m)    Wt (!) 331 lb 6.4 oz (150.3 kg)    BMI 41.98 kg/m    Physical Exam Vitals  and nursing note reviewed.  Constitutional:      Appearance: He is well-developed.  HENT:     Head: Normocephalic and atraumatic.  Eyes:     Conjunctiva/sclera: Conjunctivae normal.     Pupils: Pupils are equal, round, and reactive to light.  Cardiovascular:     Rate and Rhythm: Normal rate and regular rhythm.     Heart sounds: Normal heart sounds.  Pulmonary:     Effort: Pulmonary effort is normal.     Breath sounds: Normal breath sounds.  Abdominal:     General: Bowel sounds are normal.     Palpations: Abdomen is soft.  Musculoskeletal:     Cervical back: Normal range of motion and neck supple.  Skin:    General: Skin is warm and dry.     Capillary Refill: Capillary refill takes less than 2 seconds.  Neurological:     Mental Status: He is alert and oriented to person, place, and time.  Psychiatric:        Behavior: Behavior normal.     Musculoskeletal Exam: C-spine was in good range of motion.  Shoulder joints, elbow joints, wrist joints, MCPs PIPs and DIPs with good range of motion with no synovitis.  Hip joints, knee joints, ankles, MTPs and PIPs with good range of motion with no synovitis.  CDAI Exam: CDAI Score: -- Patient Global: --; Provider Global: -- Swollen: --; Tender: -- Joint Exam 11/02/2021   No joint exam has been documented for this visit   There is currently no information documented on the homunculus. Go to the Rheumatology activity and complete the homunculus joint  exam.  Investigation: No additional findings.  Imaging: US Venous Img Lower Right (DVT Study)  Result Date: 10/18/2021 CLINICAL DATA:  Right leg pain. EXAM: RIGHT LOWER EXTREMITY VENOUS DOPPLER ULTRASOUND TECHNIQUE: Gray-scale sonography with graded compression, as well as color Doppler and duplex ultrasound were performed to evaluate the lower extremity deep venous systems from the level of the common femoral vein and including the common femoral, femoral, profunda femoral, popliteal and calf veins including the posterior tibial, peroneal and gastrocnemius veins when visible. The superficial great saphenous vein was also interrogated. Spectral Doppler was utilized to evaluate flow at rest and with distal augmentation maneuvers in the common femoral, femoral and popliteal veins. COMPARISON:  None. FINDINGS: Contralateral Common Femoral Vein: Respiratory phasicity is normal and symmetric with the symptomatic side. No evidence of thrombus. Normal compressibility. Common Femoral Vein: No evidence of thrombus. Normal compressibility, respiratory phasicity and response to augmentation. Saphenofemoral Junction: No evidence of thrombus. Normal compressibility and flow on color Doppler imaging. Profunda Femoral Vein: No evidence of thrombus. Normal compressibility and flow on color Doppler imaging. Femoral Vein: No evidence of thrombus. Normal compressibility, respiratory phasicity and response to augmentation. Popliteal Vein: No evidence of thrombus. Normal compressibility, respiratory phasicity and response to augmentation. Calf Veins: No evidence of thrombus. Normal compressibility and flow on color Doppler imaging. Superficial Great Saphenous Vein: No evidence of thrombus. Normal compressibility. Other Findings:  None. IMPRESSION: Negative for deep venous thrombosis in right lower extremity. Electronically Signed   By: Markus Daft M.D.   On: 10/18/2021 10:40   XR Hand 2 View Left  Result Date: 11/02/2021 [facility],  PIP and DIP narrowing was noted.  No MCP, intercarpal or radiocarpal joint space narrowing was noted.  No erosive changes were noted. Impression: These findings are consistent with osteoarthritis of the hand.  XR Hand 2 View Right  Result Date: 11/02/2021 [facility],  PIP and DIP narrowing was noted.  No MCP, intercarpal or radiocarpal joint space narrowing was noted.  No erosive changes were noted. Impression: These findings are consistent with osteoarthritis of the hand.  XR KNEE 3 VIEW LEFT  Result Date: 11/02/2021 Moderate medial compartment narrowing was noted.  Mild patellofemoral narrowing was noted.  No chondrocalcinosis was noted. 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  XR KNEE 3 VIEW RIGHT  Result Date: 11/02/2021 Moderate medial compartment narrowing was noted.  Mild patellofemoral narrowing was noted.  Hardware was noted in the tibia. 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   Recent Labs: Lab Results  Component Value Date   WBC 7.3 05/31/2021   HGB 15.7 05/31/2021   PLT 273 05/31/2021   NA 136 05/31/2021   K 4.1 05/31/2021   CL 102 05/31/2021   CO2 26 05/31/2021   GLUCOSE 87 05/31/2021   BUN 12 05/31/2021   CREATININE 1.31 (H) 05/31/2021   BILITOT 0.6 02/22/2021   ALKPHOS 76 02/22/2021   AST 13 02/22/2021   ALT 17 02/22/2021   PROT 6.9 02/22/2021   ALBUMIN 4.0 02/22/2021   CALCIUM 8.8 (L) 05/31/2021   GFRAA 58 (L) 03/22/2020    Speciality Comments: No specialty comments available.  Procedures:  No procedures performed Allergies: Lactose, Caffeine, Gabapentin, Omeprazole, Peanuts [peanut oil], Penicillins, and Seasonal ic [cholestatin]   Assessment / Plan:     Visit Diagnoses: Chronic gouty arthropathy - Uric acid 9.1 on 01/21/21. colchicine 0.6 mg as needed. -Gives history of intermittent gout symptoms for the last 5 to 6 years.  He states the  pain used to be in his feet and not very frequent.  He took colchicine on a as needed basis but it caused headaches and mood changes.  He has been taking anti-inflammatories for a long time.  He states the pain has become more frequent and moves to other joints.  He has been having pain and discomfort in his shoulders, elbows, hands, knees and feet.  He is noticed visible swelling in his hands and his feet.  Detailed counsel regarding gout was provided.  Dietary modifications were discussed.  I will also send him for nutrition consult.  We discussed that starting colchicine 0.3 mg p.o. daily and if tolerated to go to 0.6 mg p.o. daily.  I will also start him on allopurinol 100 mg p.o. daily and increase it by 100 mg every week as tolerated.  I will recheck labs in 1 month which will include CBC, CMP and uric acid after starting allopurinol.  Side effects of both medications were discussed at length.  Plan: Ambulatory referral to Dermatology, Amb Referral to Nutrition and Diabetic Education  Medication monitoring encounter - Plan: CBC with Differential/Platelet, COMPLETE METABOLIC PANEL WITH GFR today.  Pain in both hands -he complains of pain and discomfort in his bilateral hands with intermittent swelling.  No synovitis was noted.  Plan: XR Hand 2 View Right, XR Hand 2 View Left, x-rays were consistent with osteoarthritis.  Sedimentation rate, Rheumatoid factor, Cyclic citrul peptide antibody, IgG, Angiotensin converting enzyme, ANA, Uric acid  Chronic pain of both knees -he complains of pain and discomfort in his bilateral knee joints.  No warmth swelling or effusion was noted.  Plan: XR KNEE 3 VIEW RIGHT, XR KNEE 3 VIEW LEFT.  X-rays were consistent with bilateral moderate osteoarthritis and mild chondromalacia patella.  Hardware was noted in the right tibia.  Pain in both feet-he complains of pain and discomfort in his bilateral feet with intermittent swelling.  No synovitis was noted.  He states the  last gout flare was about 3 weeks ago.  Shortness of breath -he complains of shortness of breath.  He had recent CT scan of the chest which showed some nodules and groundglass appearance.  I will refer him to pulmonologist for evaluation.  Plan: Ambulatory referral to Pulmonology  S/P ORIF (open reduction internal fixation) fracture-due to crush injury in 2017.  History of fibula fracture  History of fracture of tibia - Right and left   Elevated serum creatinine-patient has been taking NSAIDs for a long time.  Have advised him to avoid all NSAIDs.  Elevated CK -I do not have any recent values of  CK.  I will obtain CK today.  Plan: CK  Rash-history of recurrent rash in the axillary and inguinal region.  We will refer him to dermatology.  Venous insufficiency  History of DVT (deep vein thrombosis) - 2017 after a crush injury  Nonsustained ventricular tachycardia  Hypertriglyceridemia  History of esophageal stricture  Migraine without aura and without status migrainosus, not intractable  Hypersomnia, persistent - Works night shift  Situational anxiety  Class 3 severe obesity with body mass index (BMI) of 40.0 to 44.9 in adult, unspecified obesity type, unspecified whether serious comorbidity present ([place]) - Plan: Ambulatory referral to Dermatology, Amb Referral to Nutrition and Diabetic Education  Orders: Orders Placed This Encounter  Procedures   XR Hand 2 View Right   XR Hand 2 View Left   XR KNEE 3 VIEW RIGHT   XR KNEE 3 VIEW LEFT   CBC with Differential/Platelet   COMPLETE METABOLIC PANEL WITH GFR   Sedimentation rate   CK   Rheumatoid factor   Cyclic citrul peptide antibody, IgG   Angiotensin converting enzyme   ANA   Uric acid   Ambulatory referral to Dermatology   Amb Referral to Nutrition and Diabetic Education   Ambulatory referral to Pulmonology   Meds ordered this encounter  Medications   colchicine 0.6 MG tablet    Sig: Take 0.5 tablets (0.3 mg  total) by mouth as needed. If tolerated, increase to 0.45m daily after one week    Dispense:  30 tablet    Refill:  1   allopurinol (ZYLOPRIM) 100 MG tablet    Sig: Take 1 tab by mouth daily x 1 week, then 2 tab daily x 1 week, then 3 tab daily. Start 3 days after colchicine.    Dispense:  69 tablet    Refill:  0    Time spent in reviewing records, counseling and coordination of care was over 60 minutes. Greater than 50% of time was spent in counseling and coordination of care.  Follow-Up Instructions: Return for Gout.   SBo Merino MD  Note - This record has been created using DEditor, commissioning  Chart creation errors have been sought, but may not always  have been located. Such creation errors do not reflect on  the standard of medical care.

## 2021-11-02 ENCOUNTER — Ambulatory Visit: Payer: Managed Care, Other (non HMO) | Admitting: Rheumatology

## 2021-11-02 ENCOUNTER — Encounter: Payer: Self-pay | Admitting: Rheumatology

## 2021-11-02 ENCOUNTER — Ambulatory Visit: Payer: Self-pay

## 2021-11-02 ENCOUNTER — Other Ambulatory Visit: Payer: Self-pay

## 2021-11-02 ENCOUNTER — Telehealth: Payer: Self-pay

## 2021-11-02 VITALS — BP 142/93 | HR 69 | Ht 74.5 in | Wt 331.4 lb

## 2021-11-02 DIAGNOSIS — M79671 Pain in right foot: Secondary | ICD-10-CM

## 2021-11-02 DIAGNOSIS — Z8719 Personal history of other diseases of the digestive system: Secondary | ICD-10-CM

## 2021-11-02 DIAGNOSIS — M79642 Pain in left hand: Secondary | ICD-10-CM

## 2021-11-02 DIAGNOSIS — M79672 Pain in left foot: Secondary | ICD-10-CM

## 2021-11-02 DIAGNOSIS — R748 Abnormal levels of other serum enzymes: Secondary | ICD-10-CM

## 2021-11-02 DIAGNOSIS — E781 Pure hyperglyceridemia: Secondary | ICD-10-CM

## 2021-11-02 DIAGNOSIS — M25561 Pain in right knee: Secondary | ICD-10-CM

## 2021-11-02 DIAGNOSIS — Z9889 Other specified postprocedural states: Secondary | ICD-10-CM

## 2021-11-02 DIAGNOSIS — R21 Rash and other nonspecific skin eruption: Secondary | ICD-10-CM

## 2021-11-02 DIAGNOSIS — M25562 Pain in left knee: Secondary | ICD-10-CM | POA: Diagnosis not present

## 2021-11-02 DIAGNOSIS — R0602 Shortness of breath: Secondary | ICD-10-CM

## 2021-11-02 DIAGNOSIS — Z6841 Body Mass Index (BMI) 40.0 and over, adult: Secondary | ICD-10-CM

## 2021-11-02 DIAGNOSIS — G4710 Hypersomnia, unspecified: Secondary | ICD-10-CM

## 2021-11-02 DIAGNOSIS — F418 Other specified anxiety disorders: Secondary | ICD-10-CM

## 2021-11-02 DIAGNOSIS — M79641 Pain in right hand: Secondary | ICD-10-CM | POA: Diagnosis not present

## 2021-11-02 DIAGNOSIS — Z5181 Encounter for therapeutic drug level monitoring: Secondary | ICD-10-CM | POA: Diagnosis not present

## 2021-11-02 DIAGNOSIS — Z8781 Personal history of (healed) traumatic fracture: Secondary | ICD-10-CM

## 2021-11-02 DIAGNOSIS — Z86718 Personal history of other venous thrombosis and embolism: Secondary | ICD-10-CM

## 2021-11-02 DIAGNOSIS — G43009 Migraine without aura, not intractable, without status migrainosus: Secondary | ICD-10-CM

## 2021-11-02 DIAGNOSIS — G8929 Other chronic pain: Secondary | ICD-10-CM | POA: Diagnosis not present

## 2021-11-02 DIAGNOSIS — R7989 Other specified abnormal findings of blood chemistry: Secondary | ICD-10-CM

## 2021-11-02 DIAGNOSIS — M1A00X0 Idiopathic chronic gout, unspecified site, without tophus (tophi): Secondary | ICD-10-CM

## 2021-11-02 DIAGNOSIS — I4729 Other ventricular tachycardia: Secondary | ICD-10-CM

## 2021-11-02 DIAGNOSIS — I872 Venous insufficiency (chronic) (peripheral): Secondary | ICD-10-CM

## 2021-11-02 MED ORDER — COLCHICINE 0.6 MG PO TABS: 0.3000 mg | ORAL_TABLET | ORAL | 1 refills | Status: DC | PRN

## 2021-11-02 MED ORDER — ALLOPURINOL 100 MG PO TABS
ORAL_TABLET | ORAL | 0 refills | Status: DC
Start: 2021-11-02 — End: 2021-11-09

## 2021-11-02 NOTE — Telephone Encounter (Signed)
The supplements will not interfere with the  medications we prescribed.

## 2021-11-02 NOTE — Progress Notes (Signed)
Counseled patient on the purpose proper use, and adverse effects of colchicine.  Reviewed importance of avoiding alcohol. Discussed possible side effects of nausea, diarrhea which are dose-related. Discussed possible for muscle weakness after long period of use.  Discussed importance of low purine diet - avoiding alcohol, shellfish, red meats, etc.  Provided patient with medication education material and answered all questions.    No drug interactions - patient has been advised to avoid NSAIDs due to increase Scr.  Will recheck CMP, CBC, and uric acid in 1 month  Discussed gout prevention: he has enough attacks to merit prophylaxis with Allopurinol. The possibility of recurrent gout while lowering the uric acid is explained. Patient advised to maintain low purine diet - placed information in AVS. Long term use and side effect profile discussed. The patient indicates understanding of these issues and agrees with the plan.   Patient's dose will be: colchicine 0.3 mg once daily. If tolerated, will increase to 0.6mg  once daily. He will start allopurinol for gout ppx 3 days AFTER starting colchicine. He will start allopurinol 100mg  once daily x 1 week, increase to 200mg  once daily x 1 week, then increase to 300 mg once daily thereafter.   Knox Saliva, PharmD, MPH, BCPS Clinical Pharmacist (Rheumatology and Pulmonology)

## 2021-11-02 NOTE — Telephone Encounter (Signed)
Patient called stating he is currently taking Go-Out Plex which consists of celery seed, tart cherry and tumeric and wants to make sure that wont interfere with the medications Dr. Estanislado Pandy prescribed.

## 2021-11-02 NOTE — Patient Instructions (Addendum)
Start colchicine 0.3 mg once daily. If you tolerate this dose, then you can increase to 0.6mg  daily.  Start allopurinol 3 days AFTER starting colchicine. Take 100mg  once daily for 1 week, increase to 200mg  once daily for 1 week, then increase to 300mg  once daily.  Standing Labs We placed an order today for your standing lab work.   Please have your standing labs drawn in 1 month  If possible, please have your labs drawn 2 weeks prior to your appointment so that the provider can discuss your results at your appointment.  Please note that you may see your imaging and lab results in [facility] before we have reviewed them. We may be awaiting multiple results to interpret others before contacting you. Please allow our office up to 72 hours to thoroughly review all of the results before contacting the office for clarification of your results.  We have open lab daily: Monday through Thursday from 1:30-4:30 PM and Friday from 1:30-4:00 PM at the office of Dr. Bo Merino, [facility].   Please be advised, all patients with office appointments requiring lab work will take precedent over walk-in lab work.  If possible, please come for your lab work on Monday and Friday afternoons, as you may experience shorter wait times. The office is located at [address] [place], [place], [place] [address] No appointment is necessary.   Labs are drawn by [facility]. Please bring your co-pay at the time of your lab draw.  You may receive a bill from [facility] for your lab work.  If you wish to have your labs drawn at another location, please call the office 24 hours in advance to send orders.  If you have any questions regarding directions or hours of operation,  please call [phone].   As a reminder, please drink plenty of water prior to coming for your lab work. Thanks!  Gout Gout is painful swelling of your joints. Gout is a type of arthritis. It is caused by having too much uric acid in  your body. Uric acid is a chemical that is made when your body breaks down substances called purines. If your body has too much uric acid, sharp crystals can form and build up in your joints. This causes pain and swelling. Gout attacks can happen quickly and be very painful (acute gout). Over time, the attacks can affect more joints and happen more often (chronic gout). What are the causes? Too much uric acid in your blood. This can happen because: Your kidneys do not remove enough uric acid from your blood. Your body makes too much uric acid. You eat too many foods that are high in purines. These foods include organ meats, some seafood, and beer. Trauma or stress. What increases the risk? Having a family history of gout. Being male and middle-aged. Being male and having gone through menopause. Being very overweight (obese). Drinking alcohol, especially beer. Not having enough water in the body (being dehydrated). Losing weight too quickly. Having an organ transplant. Having lead poisoning. Taking certain medicines. Having kidney disease. Having a skin condition called psoriasis. What are the signs or symptoms? An attack of acute gout usually happens in just one joint. The most common place is the big toe. Attacks often start at night. Other joints that may be affected include joints of the feet, ankle, knee, fingers, wrist, or elbow. Symptoms of an attack may include: Very bad pain. Warmth. Swelling. Stiffness. Shiny, red, or purple skin. Tenderness. The affected joint may  be very painful to touch. Chills and fever. Chronic gout may cause symptoms more often. More joints may be involved. You may also have white or yellow lumps (tophi) on your hands or feet or in other areas near your joints. How is this treated? Treatment for this condition has two phases: treating an acute attack and preventing future attacks. Acute gout treatment may include: NSAIDs. Steroids. These are taken  by mouth or injected into a joint. Colchicine. This medicine relieves pain and swelling. It can be given by mouth or through an IV tube. Preventive treatment may include: Taking small doses of NSAIDs or colchicine daily. Using a medicine that reduces uric acid levels in your blood. Making changes to your diet. You may need to see a food expert (dietitian) about what to eat and drink to prevent gout. Follow these instructions at home: During a gout attack  If told, put ice on the painful area: Put ice in a plastic bag. Place a towel between your skin and the bag. Leave the ice on for 20 minutes, 2-3 times a day. Raise (elevate) the painful joint above the level of your heart as often as you can. Rest the joint as much as possible. If the joint is in your leg, you may be given crutches. Follow instructions from your doctor about what you cannot eat or drink. Avoiding future gout attacks Eat a low-purine diet. Avoid foods and drinks such as: Liver. Kidney. Anchovies. Asparagus. Herring. Mushrooms. Mussels. Beer. Stay at a healthy weight. If you want to lose weight, talk with your doctor. Do not lose weight too fast. Start or continue an exercise plan as told by your doctor. Eating and drinking Drink enough fluids to keep your pee (urine) pale yellow. If you drink alcohol: Limit how much you use to: 0-1 drink a day for women. 0-2 drinks a day for men. Be aware of how much alcohol is in your drink. In the [place] one drink equals one 12 oz bottle of beer (355 mL), one 5 oz glass of wine (148 mL), or one 1 oz glass of hard liquor (44 mL). General instructions Take over-the-counter and prescription medicines only as told by your doctor. Do not drive or use heavy machinery while taking prescription pain medicine. Return to your normal activities as told by your doctor. Ask your doctor what activities are safe for you. Keep all follow-up visits as told by your doctor. This is  important. Contact a doctor if: You have another gout attack. You still have symptoms of a gout attack after 10 days of treatment. You have problems (side effects) because of your medicines. You have chills or a fever. You have burning pain when you pee (urinate). You have pain in your lower back or belly. Get help right away if: You have very bad pain. Your pain cannot be controlled. You cannot pee. Summary Gout is painful swelling of the joints. The most common site of pain is the big toe, but it can affect other joints. Medicines and avoiding some foods can help to prevent and treat gout attacks. This information is not intended to replace advice given to you by your health care provider. Make sure you discuss any questions you have with your health care provider. Document Revised: 04/06/2018 Document Reviewed: 04/18/2018 Elsevier Patient Education  2022 [place] A low-purine eating plan involves making food choices to limit your intake of purine. Purine is a kind of uric acid. Too much uric  acid in your blood can cause certain conditions, such as gout and kidney stones. Eating a low-purine diet can help control these conditions. What are tips for following this plan? Reading food labels Avoid foods with saturated or Trans fat. Check the ingredient list of grains-based foods, such as bread and cereal, to make sure that they contain whole grains. Check the ingredient list of sauces or soups to make sure they do not contain meat or fish. When choosing soft drinks, check the ingredient list to make sure they do not contain high-fructose corn syrup. Shopping  Buy plenty of fresh fruits and vegetables. Avoid buying canned or fresh fish. Buy dairy products labeled as low-fat or nonfat. Avoid buying premade or processed foods. These foods are often high in fat, salt (sodium), and added sugar. Cooking Use olive oil instead of butter when cooking. Oils like  olive oil, canola oil, and sunflower oil contain healthy fats. Meal planning Learn which foods do or do not affect you. If you find out that a food tends to cause your gout symptoms to flare up, avoid eating that food. You can enjoy foods that do not cause problems. If you have any questions about a food item, talk with your dietitian or health care provider. Limit foods high in fat, especially saturated fat. Fat makes it harder for your body to get rid of uric acid. Choose foods that are lower in fat and are lean sources of protein. General guidelines Limit alcohol intake to no more than 1 drink a day for nonpregnant women and 2 drinks a day for men. One drink equals 12 oz of beer, 5 oz of wine, or 1 oz of hard liquor. Alcohol can affect the way your body gets rid of uric acid. Drink plenty of water to keep your urine clear or pale yellow. Fluids can help remove uric acid from your body. If directed by your health care provider, take a vitamin C supplement. Work with your health care provider and dietitian to develop a plan to achieve or maintain a healthy weight. Losing weight can help reduce uric acid in your blood. What foods are recommended? The items listed may not be a complete list. Talk with your dietitian about what dietary choices are best for you. Foods low in purines Foods low in purines do not need to be limited. These include: All fruits. All low-purine vegetables, pickles, and olives. Breads, pasta, rice, cornbread, and popcorn. Cake and other baked goods. All dairy foods. Eggs, nuts, and nut butters. Spices and condiments, such as salt, herbs, and vinegar. Plant oils, butter, and margarine. Water, sugar-free soft drinks, tea, coffee, and cocoa. Vegetable-based soups, broths, sauces, and gravies. Foods moderate in purines Foods moderate in purines should be limited to the amounts listed.  cup of asparagus, cauliflower, spinach, mushrooms, or green peas, each day. 2/3 cup  uncooked oatmeal, each day.  cup dry wheat bran or wheat germ, each day. 2-3 ounces of meat or poultry, each day. 4-6 ounces of shellfish, such as crab, lobster, oysters, or shrimp, each day. 1 cup cooked beans, peas, or lentils, each day. Soup, broths, or bouillon made from meat or fish. Limit these foods as much as possible. What foods are not recommended? The items listed may not be a complete list. Talk with your dietitian about what dietary choices are best for you. Limit your intake of foods high in purines, including: Beer and other alcohol. Meat-based gravy or sauce. Canned or fresh fish, such as: Anchovies,  sardines, herring, and tuna. Mussels and scallops. Codfish, trout, and haddock. Berniece Salines. Organ meats, such as: Liver or kidney. Tripe. Sweetbreads (thymus gland or pancreas). Wild Clinical biochemist. Yeast or yeast extract supplements. Drinks sweetened with high-fructose corn syrup. Summary Eating a low-purine diet can help control conditions caused by too much uric acid in the body, such as gout or kidney stones. Choose low-purine foods, limit alcohol, and limit foods high in fat. You will learn over time which foods do or do not affect you. If you find out that a food tends to cause your gout symptoms to flare up, avoid eating that food. This information is not intended to replace advice given to you by your health care provider. Make sure you discuss any questions you have with your health care provider. Document Revised: 01/09/2020 Document Reviewed: 01/09/2020 Elsevier Patient Education  [place].

## 2021-11-02 NOTE — Telephone Encounter (Signed)
Patient advised the supplements will not interfere with the  medications we prescribed.

## 2021-11-03 ENCOUNTER — Encounter: Payer: Self-pay | Admitting: Rheumatology

## 2021-11-03 ENCOUNTER — Telehealth: Payer: Self-pay

## 2021-11-03 NOTE — Progress Notes (Signed)
Francis Lin has an appointment with you on the 26th.  His ACE level was elevated.  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  Sarcoidosis is in the differential now with elevated ACE.  I would appreciate your opinion. Thank you

## 2021-11-03 NOTE — Telephone Encounter (Signed)
Patient called stating he reviewed his labwork on Mychart and saw that his uric acid was 7.8.  Patient requested a return call to discuss if the results effect his medication dosage.

## 2021-11-04 LAB — CBC WITH DIFFERENTIAL/PLATELET
Absolute Monocytes: 603 cells/uL (ref 200–950)
Basophils Absolute: 60 cells/uL (ref 0–200)
Basophils Relative: 0.9 %
Eosinophils Absolute: 134 cells/uL (ref 15–500)
Eosinophils Relative: 2 %
HCT: 48.9 % (ref 38.5–50.0)
Hemoglobin: 16.2 g/dL (ref 13.2–17.1)
Lymphs Abs: 2090 cells/uL (ref 850–3900)
MCH: 29.6 pg (ref 27.0–33.0)
MCHC: 33.1 g/dL (ref 32.0–36.0)
MCV: 89.4 fL (ref 80.0–100.0)
MPV: 9.9 fL (ref 7.5–12.5)
Monocytes Relative: 9 %
Neutro Abs: 3812 cells/uL (ref 1500–7800)
Neutrophils Relative %: 56.9 %
Platelets: 280 10*3/uL (ref 140–400)
RBC: 5.47 10*6/uL (ref 4.20–5.80)
RDW: 13.5 % (ref 11.0–15.0)
Total Lymphocyte: 31.2 %
WBC: 6.7 10*3/uL (ref 3.8–10.8)

## 2021-11-04 LAB — COMPLETE METABOLIC PANEL WITH GFR
AG Ratio: 1.5 (calc) (ref 1.0–2.5)
ALT: 16 U/L (ref 9–46)
AST: 16 U/L (ref 10–40)
Albumin: 4.3 g/dL (ref 3.6–5.1)
Alkaline phosphatase (APISO): 76 U/L (ref 36–130)
BUN/Creatinine Ratio: 11 (calc) (ref 6–22)
BUN: 17 mg/dL (ref 7–25)
CO2: 30 mmol/L (ref 20–32)
Calcium: 9.5 mg/dL (ref 8.6–10.3)
Chloride: 106 mmol/L (ref 98–110)
Creat: 1.49 mg/dL — ABNORMAL HIGH (ref 0.60–1.29)
Globulin: 2.9 g/dL (calc) (ref 1.9–3.7)
Glucose, Bld: 83 mg/dL (ref 65–99)
Potassium: 4.7 mmol/L (ref 3.5–5.3)
Sodium: 141 mmol/L (ref 135–146)
Total Bilirubin: 0.5 mg/dL (ref 0.2–1.2)
Total Protein: 7.2 g/dL (ref 6.1–8.1)
eGFR: 59 mL/min/{1.73_m2} — ABNORMAL LOW (ref >=60)

## 2021-11-04 LAB — CYCLIC CITRUL PEPTIDE ANTIBODY, IGG: Cyclic Citrullin Peptide Ab: <16 UNITS

## 2021-11-04 LAB — ANA: Anti Nuclear Antibody (ANA): NEGATIVE

## 2021-11-04 LAB — SEDIMENTATION RATE: Sed Rate: 19 mm/h — ABNORMAL HIGH (ref 0–15)

## 2021-11-04 LAB — URIC ACID: Uric Acid, Serum: 7.8 mg/dL (ref 4.0–8.0)

## 2021-11-04 LAB — RHEUMATOID FACTOR: Rheumatoid fact SerPl-aCnc: <14 IU/mL (ref <14)

## 2021-11-04 LAB — CK: Total CK: 99 U/L (ref 44–196)

## 2021-11-04 LAB — ANGIOTENSIN CONVERTING ENZYME: Angiotensin-Converting Enzyme: 126 U/L — ABNORMAL HIGH (ref 9–67)

## 2021-11-04 NOTE — Progress Notes (Signed)
Office Visit Note  Patient: Francis Lin             Date of Birth: 07-23-1976           MRN: [id number]             PCP: [name], MD Referring: [name], MD Visit Date: 11/09/2021 Occupation: '@[email or web address]' @  Subjective:  Medication management  History of Present Illness: Francis Lin is a 46 y.o. male with history of gouty arthropathy and osteoarthritis.  He states that after his last visit he started taking colchicine.  He took colchicine for 3 days and then stopped it.  He took 1 dose of allopurinol and developed rash all over his body.  He states he stopped allopurinol after that.  He has not been taking any medications.  He has intermittent pain and discomfort in his left ankle.  He is trying to take some over-the-counter supplements.  None of the other joints are painful.  He saw Dr. Vaughan Browner for pulmonary nodules.  He will have PFTs and further work-up which is pending at this time.  He also has an appointment coming up with the dermatologist for the axillary and inguinal rash.  Activities of Daily Living:  Patient reports morning stiffness for 0 minutes.   Patient Reports nocturnal pain.  Difficulty dressing/grooming: Denies Difficulty climbing stairs: Reports Difficulty getting out of chair: Denies Difficulty using hands for taps, buttons, cutlery, and/or writing: Denies  Review of Systems  Constitutional:  Negative for fatigue.  HENT:  Positive for mouth dryness. Negative for mouth sores and nose dryness.   Eyes:  Positive for pain, itching and dryness.  Respiratory:  Positive for shortness of breath. Negative for difficulty breathing.   Cardiovascular:  Negative for chest pain and palpitations.  Gastrointestinal:  Negative for blood in stool, constipation and diarrhea.  Endocrine: Negative for increased urination.  Genitourinary:  Negative for difficulty urinating.  Musculoskeletal:  Positive for myalgias, muscle tenderness and myalgias. Negative  for joint pain, joint pain, joint swelling and morning stiffness.  Skin:  Positive for rash. Negative for color change and redness.  Allergic/Immunologic: Negative for susceptible to infections.  Neurological:  Negative for dizziness, numbness, headaches and weakness.  Hematological:  Negative for bruising/bleeding tendency.  Psychiatric/Behavioral:  Negative for confusion.    PMFS History:  Patient Active Problem List   Diagnosis Date Noted   LUQ abdominal pain 11/03/2020   Generalized abdominal pain 11/03/2020   Altered bowel habits 11/03/2020   Nausea without vomiting 11/03/2020   Migraine without aura and without status migrainosus, not intractable 11/22/2018   Nonsustained ventricular tachycardia 08/03/2018   Elevated serum creatinine 06/18/2018   Slow heart rate 06/06/2018   Shortness of breath 04/04/2018   Venous insufficiency 01/05/2017   Deep vein thrombosis (DVT) of popliteal vein of right lower extremity ([place]) 06/30/2016   S/P ORIF (open reduction internal fixation) fracture 06/22/2016   Multiple fractures of both lower extremities 05/31/2016   Elevated CK 05/17/2016   Fracture of right fibula 05/17/2016   Fracture of right tibia 05/17/2016   Fracture of tibia, left, closed 05/17/2016   Trauma 05/17/2016   Situational anxiety 08/28/2014   Unspecified vitamin D deficiency 09/22/2013   Shifting sleep-work schedule, affecting sleep 06/20/2013   Snoring disorder 06/20/2013   Hypersomnia, persistent    History of esophageal stricture 04/16/2013   Pain in joint, ankle and foot 04/16/2013   Gout 04/16/2013   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   OBESITY,  CLASS II, BMI 35- 39.9 03/15/2012   Hypertriglyceridemia 03/15/2012   Atypical chest pain 12/02/2008   Dysphagia 12/02/2008    Past Medical History:  Diagnosis Date   Acid reflux    Allergy    Anxiety    stress    Clotting disorder (HCC)    right leg x 2 in calf and behind knee    DVT (deep venous thrombosis)  (HCC)    Esophagitis    Gout    Hypersomnia, persistent    IBS (irritable bowel syndrome)    PONV (postoperative nausea and vomiting)    nausea with no vomiting    Shifting sleep-work schedule, affecting sleep 06/20/2013   Snoring disorder 06/20/2013   Tubular adenoma of colon     Family History  Problem Relation Age of Onset   Lupus Mother    Heart attack Father    Arthritis Father    Hypertension Father    Cancer Father        multiple myeloma   Diabetes Father    Atrial fibrillation Father    Gout Father    Edema Sister    Diabetes Sister    Bipolar disorder Sister    Stroke Sister    Obesity Sister    Adrenal disorder Sister    Lupus Maternal Aunt    Eczema Daughter    ADD / ADHD Daughter    Asthma Daughter    Eczema Daughter    ADD / ADHD Daughter    Hypotension Daughter    Eczema Daughter    High Cholesterol Daughter    Colon cancer Neg Hx    Colon polyps Neg Hx    Esophageal cancer Neg Hx    Rectal cancer Neg Hx    Stomach cancer Neg Hx    Past Surgical History:  Procedure Laterality Date   COLONOSCOPY     ESOPHAGEAL DILATION     HEMORRHOID BANDING     ORIF TIBIA & FIBULA FRACTURES Right    right leg surgery x 2 places- rod in place knee to ankle    UPPER GASTROINTESTINAL ENDOSCOPY     wisdom teeth     wrist surgery Right    Social History   Social History Narrative   Not on file   Immunization History  Administered Date(s) Administered   PFIZER(Purple Top)SARS-COV-2 Vaccination 12/19/2019, 01/09/2020, 11/09/2020   Tdap 08/19/2011, 05/17/2016     Objective: Vital Signs: BP 127/87 (BP Location: Right Arm, Patient Position: Sitting, Cuff Size: Large)    Pulse 67    Ht 6' 2.5" (1.892 m)    Wt (!) 334 lb 9.6 oz (151.8 kg)    BMI 42.39 kg/m    Physical Exam Vitals and nursing note reviewed.  Constitutional:      Appearance: He is well-developed.  HENT:     Head: Normocephalic and atraumatic.  Eyes:     Conjunctiva/sclera: Conjunctivae  normal.     Pupils: Pupils are equal, round, and reactive to light.  Cardiovascular:     Rate and Rhythm: Normal rate and regular rhythm.     Heart sounds: Normal heart sounds.  Pulmonary:     Effort: Pulmonary effort is normal.     Breath sounds: Normal breath sounds.  Abdominal:     General: Bowel sounds are normal.     Palpations: Abdomen is soft.  Musculoskeletal:     Cervical back: Normal range of motion and neck supple.  Skin:    General: Skin is warm  and dry.     Capillary Refill: Capillary refill takes less than 2 seconds.  Neurological:     Mental Status: He is alert and oriented to person, place, and time.  Psychiatric:        Behavior: Behavior normal.     Musculoskeletal Exam: C-spine thoracic and lumbar spine were in good range of motion.  Shoulder joints, elbow joints, wrist joints, MCPs PIPs and DIPs with good range of motion.  Hip joints, knee joints, ankles, MTPs and PIPs with good range of motion.  He had mild tenderness over left ankle joint.  CDAI Exam: CDAI Score: -- Patient Global: --; Provider Global: -- Swollen: --; Tender: -- Joint Exam 11/09/2021   No joint exam has been documented for this visit   There is currently no information documented on the homunculus. Go to the Rheumatology activity and complete the homunculus joint exam.  Investigation: No additional findings.  Imaging: US Venous Img Lower Right (DVT Study)  Result Date: 10/18/2021 CLINICAL DATA:  Right leg pain. EXAM: RIGHT LOWER EXTREMITY VENOUS DOPPLER ULTRASOUND TECHNIQUE: Gray-scale sonography with graded compression, as well as color Doppler and duplex ultrasound were performed to evaluate the lower extremity deep venous systems from the level of the common femoral vein and including the common femoral, femoral, profunda femoral, popliteal and calf veins including the posterior tibial, peroneal and gastrocnemius veins when visible. The superficial great saphenous vein was also  interrogated. Spectral Doppler was utilized to evaluate flow at rest and with distal augmentation maneuvers in the common femoral, femoral and popliteal veins. COMPARISON:  None. FINDINGS: Contralateral Common Femoral Vein: Respiratory phasicity is normal and symmetric with the symptomatic side. No evidence of thrombus. Normal compressibility. Common Femoral Vein: No evidence of thrombus. Normal compressibility, respiratory phasicity and response to augmentation. Saphenofemoral Junction: No evidence of thrombus. Normal compressibility and flow on color Doppler imaging. Profunda Femoral Vein: No evidence of thrombus. Normal compressibility and flow on color Doppler imaging. Femoral Vein: No evidence of thrombus. Normal compressibility, respiratory phasicity and response to augmentation. Popliteal Vein: No evidence of thrombus. Normal compressibility, respiratory phasicity and response to augmentation. Calf Veins: No evidence of thrombus. Normal compressibility and flow on color Doppler imaging. Superficial Great Saphenous Vein: No evidence of thrombus. Normal compressibility. Other Findings:  None. IMPRESSION: Negative for deep venous thrombosis in right lower extremity. Electronically Signed   By: Markus Daft M.D.   On: 10/18/2021 10:40   XR Hand 2 View Left  Result Date: 11/02/2021 CMC, PIP and DIP narrowing was noted.  No MCP, intercarpal or radiocarpal joint space narrowing was noted.  No erosive changes were noted. Impression: These findings are consistent with osteoarthritis of the hand.  XR Hand 2 View Right  Result Date: 11/02/2021 CMC, PIP and DIP narrowing was noted.  No MCP, intercarpal or radiocarpal joint space narrowing was noted.  No erosive changes were noted. Impression: These findings are consistent with osteoarthritis of the hand.  XR KNEE 3 VIEW LEFT  Result Date: 11/02/2021 Moderate medial compartment narrowing was noted.  Mild patellofemoral narrowing was noted.  No chondrocalcinosis  was noted. 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  XR KNEE 3 VIEW RIGHT  Result Date: 11/02/2021 Moderate medial compartment narrowing was noted.  Mild patellofemoral narrowing was noted.  Hardware was noted in the tibia. 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   Recent Labs: Lab Results  Component Value Date   WBC  6.7 11/02/2021   HGB 16.2 11/02/2021   PLT 280 11/02/2021   NA 141 11/02/2021   K 4.7 11/02/2021   CL 106 11/02/2021   CO2 30 11/02/2021   GLUCOSE 83 11/02/2021   BUN 17 11/02/2021   CREATININE 1.49 (H) 11/02/2021   BILITOT 0.5 11/02/2021   ALKPHOS 76 02/22/2021   AST 16 11/02/2021   ALT 16 11/02/2021   PROT 7.2 11/02/2021   ALBUMIN 4.0 02/22/2021   CALCIUM 9.5 11/02/2021   GFRAA 58 (L) 03/22/2020   November 02, 2021 ESR 19, CK 99, RF negative, anti-CCP negative, ACE 126, ANA negative, uric acid 7.8  January 21, 2021 uric acid 9.1  Speciality Comments: No specialty comments available.  Procedures:  No procedures performed Allergies: Lactose, Allopurinol, Caffeine, Gabapentin, Omeprazole, Peanuts [peanut oil], Penicillins, and Seasonal ic [cholestatin]   Assessment / Plan:     Visit Diagnoses: Chronic gouty arthropathy - History of frequent gout flares.  Not crystal proven.  Patient was placed on colchicine and allopurinol at the last visit. -He took colchicine for 3 days and then stopped.  He took allopurinol 1 dose and developed a rash and discontinued allopurinol.  He continues to have intermittent discomfort in his left ankle joint.  Mild tenderness on palpation.  I had a detailed discussion with patient and his wife Gerre Pebbles) who was on the phone.  Indications side effects contraindications of Uloric were discussed.  They were in agreement to proceed with the medication.  Increased risk of cardiovascular events with Uloric was also  emphasized.  He was advised to take colchicine  0.6 mg p.o. daily and to stay on the medication until we tell him to stop it.  In case he has a flare then he will increase the dose of colchicine to 0.6 mg p.o. twice daily.  He will stay on Uloric 40 mg p.o. daily.  We will recheck labs at the follow-up visit.  Pain in both hands - History of intermittent swelling in bilateral hands.  No synovitis was noted on the exam today..  X-rays were consistent with osteoarthritis.[place] findings were discussed with the patient and his wife.  Joint protection muscle strengthening was discussed.  Primary osteoarthritis of both hands  Primary osteoarthritis of both knees - Bilateral moderate osteoarthritis and mild chondromalacia patella.  X-ray findings were discussed with the patient and his wife.  Weight loss diet and exercise was emphasized.  Lower extremity muscle strengthening exercises were discussed.  History of chronic pain.  No warmth swelling or effusion was noted.  Pain in both feet - History of intermittent swelling.  He had mild tenderness over left ankle joint.  No synovitis was noted.  Shortness of breath - Groundglass attenuation noted on the CT scan.  He is followed by Dr. Vaughan Browner.  Multiple pulmonary nodules - ACE elevated at 126.  According to Dr. Matilde Bash note his CT findings were not consistent with sarcoidosis.  S/P ORIF (open reduction internal fixation) fracture - Crush injury 2017.  History of fibula fracture  History of fracture of tibia  Elevated serum creatinine - History of chronic use of NSAIDs.  He has chronic elevation of creatinine.  I will refer him to nephrology.- Plan: Ambulatory referral to Nephrology  Rash - History of recurrent rash in the axillary and inguinal region.  He was referred to dermatology.  His appointment is pending.  History of DVT (deep vein thrombosis) - In 2017 with crush injury  Venous insufficiency  Nonsustained ventricular  tachycardia  Hypertriglyceridemia  History of esophageal stricture  Migraine without aura and without status migrainosus, not intractable  Hypersomnia, persistent  Situational anxiety  Class 3 severe obesity with body mass index (BMI) of 40.0 to 44.9 in adult, unspecified obesity type, unspecified whether serious comorbidity present [facility]) - Patient was referred to nutrition and diabetic education.  Patient has not scheduled an appointment yet.  Orders: Orders Placed This Encounter  Procedures   Ambulatory referral to Nephrology   Meds ordered this encounter  Medications   febuxostat (ULORIC) 40 MG tablet    Sig: Take 1 tablet (40 mg total) by mouth daily.    Dispense:  30 tablet    Refill:  2    Face-to-face time spent with patient was 30 minutes. Greater than 50% of time was spent in counseling and coordination of care.  Follow-Up Instructions: Return in about 6 weeks (around 12/21/2021) for Gout.   Bo Merino, MD  Note - This record has been created using Editor, commissioning.  Chart creation errors have been sought, but may not always  have been located. Such creation errors do not reflect on  the standard of medical care.

## 2021-11-05 ENCOUNTER — Other Ambulatory Visit: Payer: Self-pay

## 2021-11-05 ENCOUNTER — Encounter: Payer: Self-pay | Admitting: Pulmonary Disease

## 2021-11-05 ENCOUNTER — Ambulatory Visit: Payer: Managed Care, Other (non HMO) | Admitting: Pulmonary Disease

## 2021-11-05 VITALS — BP 134/80 | HR 94 | Temp 98.1°F | Ht 74.5 in | Wt 334.0 lb

## 2021-11-05 DIAGNOSIS — R0602 Shortness of breath: Secondary | ICD-10-CM | POA: Diagnosis not present

## 2021-11-05 NOTE — Patient Instructions (Signed)
We will schedule for high-resolution CT and PFTs for further evaluation of lung Follow-up in clinic in 1 to 2 months

## 2021-11-05 NOTE — Progress Notes (Signed)
Francis Lin    [id number]    May 05, 1976  Primary Care Physician:Burchette, Alinda Sierras, MD  Referring Physician: Bo Merino, MD [name],  [place] [id number]  Chief complaint: Consult for abnormal CT, elevated angiotensin-converting enzyme.  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  He had a work-up which included elevated ACE level CT last year showed subcentimeter pulmonary nodules  Denies any dyspnea, cough.  No rash, vision or liver issues.  He had COVID infection in 2021 and 2022 but did not require hospitalization History of right leg crush injury in 2017 requiring multiple surgeries.  He had a DVT in the setting and was anticoagulated for about 4 months  Pets: No pets Occupation: Games developer Exposures: No mold, hot tub, Jacuzzi.  No feather pillows or comforter Smoking history: Never smoker Travel history: No significant travel history Relevant family history: No family history of lung disease  Outpatient Encounter Medications as of 11/05/2021  Medication Sig   acetaminophen (TYLENOL) 325 MG tablet Take 650 mg by mouth every 6 (six) hours as needed for mild pain.   colchicine 0.6 MG tablet Take 0.5 tablets (0.3 mg total) by mouth as needed. If tolerated, increase to 0.27m daily after one week   cyclobenzaprine (FLEXERIL) 10 MG tablet Take 1 tablet (10 mg total) by mouth 2 (two) times daily as needed for muscle spasms.   loratadine (CLARITIN) 10 MG tablet Take 10 mg by mouth as needed for allergies.   mometasone (NASONEX) 50 MCG/ACT nasal spray Place 2 sprays into the nose daily.   mupirocin ointment (BACTROBAN) 2 % Apply 1 application topically daily.   OVER  THE COUNTER MEDICATION GoutFlex-as needed   allopurinol (ZYLOPRIM) 100 MG tablet Take 1 tab by mouth daily x 1 week, then 2 tab daily x 1 week, then 3 tab daily. Start 3 days after colchicine. (Patient not taking: Reported on 11/05/2021)   ondansetron (ZOFRAN) 4 MG tablet Take 1 tablet (4 mg total) by mouth every 8 (eight) hours as needed. (Patient not taking: Reported on 11/05/2021)   tiZANidine (ZANAFLEX) 4 MG capsule Take 1 capsule (4 mg total) by mouth 3 (three) times daily as needed for muscle spasms. (Patient not taking: Reported on 11/02/2021)   [DISCONTINUED] aspirin EC 81 MG tablet Take 81 mg by mouth every 6 (six) hours as needed for mild pain. (Patient not taking: Reported on 11/02/2021)   [DISCONTINUED] cimetidine (TAGAMET) 200 MG tablet Take by mouth.   [DISCONTINUED] diclofenac (VOLTAREN) 75 MG EC tablet Take 1 tablet (75 mg total) by mouth 2 (two) times daily. (Patient not taking: Reported on 11/05/2021)   [DISCONTINUED] ibuprofen (ADVIL) 600 MG tablet Take 1 tablet (600 mg total) by mouth every 6 (six) hours as needed for mild pain or moderate pain.   [DISCONTINUED] predniSONE (STERAPRED UNI-PAK 21 TAB) 10 MG (21) TBPK tablet As directed (Patient not taking: Reported on 11/02/2021)   [DISCONTINUED] RABEprazole (ACIPHEX) 20 MG tablet Take 1 tablet (20 mg total) by mouth daily. (Patient not taking: Reported on 11/02/2021)   No facility-administered encounter medications on file as of 11/05/2021.    Allergies as of 11/05/2021 - Review Complete 11/05/2021  Allergen Reaction Noted   Lactose Diarrhea and Other (  See Comments) 07/21/2016   Caffeine Other (See Comments)    Gabapentin  11/02/2021   Omeprazole Other (See Comments) 03/15/2012   Peanuts [peanut oil]  04/15/2012   Penicillins Other (See Comments)    Seasonal ic [cholestatin]  06/20/2013    Past Medical History:  Diagnosis Date   Acid reflux    Allergy    Anxiety    stress    Clotting disorder (HCC)    right leg x 2 in calf  and behind knee    DVT (deep venous thrombosis) (HCC)    Esophagitis    Gout    Hypersomnia, persistent    IBS (irritable bowel syndrome)    PONV (postoperative nausea and vomiting)    nausea with no vomiting    Shifting sleep-work schedule, affecting sleep 06/20/2013   Snoring disorder 06/20/2013   Tubular adenoma of colon     Past Surgical History:  Procedure Laterality Date   COLONOSCOPY     ESOPHAGEAL DILATION     HEMORRHOID BANDING     ORIF TIBIA & FIBULA FRACTURES Right    right leg surgery x 2 places- rod in place knee to ankle    UPPER GASTROINTESTINAL ENDOSCOPY     wisdom teeth     wrist surgery Right     Family History  Problem Relation Age of Onset   Lupus Mother    Heart attack Father    Arthritis Father    Hypertension Father    Cancer Father        multiple myeloma   Diabetes Father    Atrial fibrillation Father    Gout Father    Edema Sister    Diabetes Sister    Bipolar disorder Sister    Stroke Sister    Obesity Sister    Adrenal disorder Sister    Lupus Maternal Aunt    Eczema Daughter    ADD / ADHD Daughter    Asthma Daughter    Eczema Daughter    ADD / ADHD Daughter    Hypotension Daughter    Eczema Daughter    High Cholesterol Daughter    Colon cancer Neg Hx    Colon polyps Neg Hx    Esophageal cancer Neg Hx    Rectal cancer Neg Hx    Stomach cancer Neg Hx     Social History   Socioeconomic History   Marital status: Married    Spouse name: Not on file   Number of children: 3   Years of education: college   Highest education level: Not on file  Occupational History    Employer: [employer]    Comment: [employer]  Tobacco Use   Smoking status: Never   Smokeless tobacco: Never  Vaping Use   Vaping Use: Never used  Substance and Sexual Activity   Alcohol use: No   Drug use: No   Sexual activity: Not on file  Other Topics Concern   Not on file  Social History Narrative   Not on file   Social Determinants of  Health   Financial Resource Strain: Not on file  Food Insecurity: Not on file  Transportation Needs: Not on file  Physical Activity: Not on file  Stress: Not on file  Social Connections: Not on file  Intimate Partner Violence: Not on file    Review of systems: Review of Systems  Constitutional: Negative for fever and chills.  HENT: Negative.   Eyes: Negative for blurred vision.  Respiratory: as per  HPI  Cardiovascular: Negative for chest pain and palpitations.  Gastrointestinal: Negative for vomiting, diarrhea, blood per rectum. Genitourinary: Negative for dysuria, urgency, frequency and hematuria.  Musculoskeletal: Negative for myalgias, back pain and joint pain.  Skin: Negative for itching and rash.  Neurological: Negative for dizziness, tremors, focal weakness, seizures and loss of consciousness.  Endo/Heme/Allergies: Negative for environmental allergies.  Psychiatric/Behavioral: Negative for depression, suicidal ideas and hallucinations.  All other systems reviewed and are negative.  Physical Exam: Blood pressure 134/80, pulse 94, temperature 98.1 F (36.7 C), temperature source Oral, height 6' 2.5" (1.892 m), weight (!) 334 lb (151.5 kg), SpO2 98 %. Gen:      No acute distress HEENT:  EOMI, sclera anicteric Neck:     No masses; no thyromegaly Lungs:    Clear to auscultation bilaterally; normal respiratory effort CV:         Regular rate and rhythm; no murmurs Abd:      + bowel sounds; soft, non-tender; no palpable masses, no distension Ext:    No edema; adequate peripheral perfusion Skin:      Warm and dry; no rash Neuro: alert and oriented x 3 Psych: normal mood and affect  Data Reviewed: Imaging: CT chest 06/15/2021-interval decrease in 9 mm left lower lobe pulmonary nodule with groundglass attenuation.  Scattered bilateral pulmonary nodules measuring up to 6 mm.  No lymphadenopathy. I have reviewed the images personally.  PFTs:   Labs: Hepatic panel  11/02/2021-within normal limits Angiotensin-converting enzyme 11/02/2021-126 CCP, rheumatoid factor negative  Assessment:  Assessment for sarcoidosis Although he has elevated angiotensin-converting enzyme I suspect this is a nonspecific elevation.  His CT does not have any typical findings of sarcoidosis such as lymphadenopathy or peribronchovascular nodularity.  He does have a small groundglass nodules which are improved on follow-up scan.  This is likely benign finding  We will get a high-resolution CT for evaluation and PFTs for baseline assessment of lung function Return to clinic in 1 to 2 months  Plan/Recommendations: High-res CT, PFTs  Marshell Garfinkel MD [facility] 11/05/2021, 2:02 PM  CC: Bo Merino, MD

## 2021-11-09 ENCOUNTER — Ambulatory Visit (INDEPENDENT_AMBULATORY_CARE_PROVIDER_SITE_OTHER): Payer: Managed Care, Other (non HMO) | Admitting: Rheumatology

## 2021-11-09 ENCOUNTER — Other Ambulatory Visit: Payer: Self-pay

## 2021-11-09 ENCOUNTER — Encounter: Payer: Self-pay | Admitting: Rheumatology

## 2021-11-09 VITALS — BP 127/87 | HR 67 | Ht 74.5 in | Wt 334.6 lb

## 2021-11-09 DIAGNOSIS — E781 Pure hyperglyceridemia: Secondary | ICD-10-CM

## 2021-11-09 DIAGNOSIS — M170 Bilateral primary osteoarthritis of knee: Secondary | ICD-10-CM | POA: Diagnosis not present

## 2021-11-09 DIAGNOSIS — Z8719 Personal history of other diseases of the digestive system: Secondary | ICD-10-CM

## 2021-11-09 DIAGNOSIS — Z6841 Body Mass Index (BMI) 40.0 and over, adult: Secondary | ICD-10-CM

## 2021-11-09 DIAGNOSIS — R918 Other nonspecific abnormal finding of lung field: Secondary | ICD-10-CM

## 2021-11-09 DIAGNOSIS — Z8781 Personal history of (healed) traumatic fracture: Secondary | ICD-10-CM

## 2021-11-09 DIAGNOSIS — Z9889 Other specified postprocedural states: Secondary | ICD-10-CM

## 2021-11-09 DIAGNOSIS — M1A00X0 Idiopathic chronic gout, unspecified site, without tophus (tophi): Secondary | ICD-10-CM

## 2021-11-09 DIAGNOSIS — I4729 Other ventricular tachycardia: Secondary | ICD-10-CM

## 2021-11-09 DIAGNOSIS — M79641 Pain in right hand: Secondary | ICD-10-CM | POA: Diagnosis not present

## 2021-11-09 DIAGNOSIS — M19041 Primary osteoarthritis, right hand: Secondary | ICD-10-CM | POA: Diagnosis not present

## 2021-11-09 DIAGNOSIS — F418 Other specified anxiety disorders: Secondary | ICD-10-CM

## 2021-11-09 DIAGNOSIS — G43009 Migraine without aura, not intractable, without status migrainosus: Secondary | ICD-10-CM

## 2021-11-09 DIAGNOSIS — M1A9XX0 Chronic gout, unspecified, without tophus (tophi): Secondary | ICD-10-CM

## 2021-11-09 DIAGNOSIS — I872 Venous insufficiency (chronic) (peripheral): Secondary | ICD-10-CM

## 2021-11-09 DIAGNOSIS — R7989 Other specified abnormal findings of blood chemistry: Secondary | ICD-10-CM

## 2021-11-09 DIAGNOSIS — Z86718 Personal history of other venous thrombosis and embolism: Secondary | ICD-10-CM

## 2021-11-09 DIAGNOSIS — M79672 Pain in left foot: Secondary | ICD-10-CM

## 2021-11-09 DIAGNOSIS — M79671 Pain in right foot: Secondary | ICD-10-CM

## 2021-11-09 DIAGNOSIS — G4710 Hypersomnia, unspecified: Secondary | ICD-10-CM

## 2021-11-09 DIAGNOSIS — R21 Rash and other nonspecific skin eruption: Secondary | ICD-10-CM

## 2021-11-09 DIAGNOSIS — M79642 Pain in left hand: Secondary | ICD-10-CM

## 2021-11-09 DIAGNOSIS — M19042 Primary osteoarthritis, left hand: Secondary | ICD-10-CM

## 2021-11-09 DIAGNOSIS — R0602 Shortness of breath: Secondary | ICD-10-CM

## 2021-11-09 MED ORDER — FEBUXOSTAT 40 MG PO TABS: 40.0000 mg | ORAL_TABLET | Freq: Every day | ORAL | 2 refills | Status: DC

## 2021-11-09 NOTE — Patient Instructions (Signed)
Start colchicine and take 0.6 mg 1 tablet p.o. daily.  If you have a flare then you can increase colchicine to 0.6 mg, 1 tablet twice a day.  After 3 days start Uloric 40 mg, 1 tablet daily.  Stay on colchicine and Uloric.   Febuxostat oral tablets What is this medication? FEBUXOSTAT (feb UX oh stat) is used to treat gout. People with gout have too much uric acid in their body. This medicine works to lower how much uric acid the body makes. This medicine may be used for other purposes; ask your health care provider or pharmacist if you have questions. COMMON BRAND NAME(S): Uloric What should I tell my care team before I take this medication? They need to know if you have any of these conditions: heart disease history of heart attack or stroke kidney disease liver disease taking azathioprine or mercaptopurine an unusual or allergic reaction to febuxostat, other medicines, foods, dyes, or preservatives pregnant or trying to get pregnant breast feeding How should I use this medication? Take this medicine by mouth with a glass of water. Follow the directions on the prescription label. You can take it with or without food. Take your medicine at regular intervals. Do not take it more often than directed. Do not stop taking except on your doctor's advice. Talk to your pediatrician regarding the use of this medicine in children. This medicine is not approved for use in children. Overdosage: If you think you have taken too much of this medicine contact a poison control center or emergency room at once. NOTE: This medicine is only for you. Do not share this medicine with others. What if I miss a dose? If you miss a dose, take it as soon as you can. If it is almost time for your next dose, take only that dose. Do not take double or extra doses. What may interact with this medication? Do not take this medicine with any of the following medications: azathioprine mercaptopurine This medicine may also  interact with the following medications: aminophylline certain medicines used to treat cancer pegloticase rasburicase theophylline This list may not describe all possible interactions. Give your health care provider a list of all the medicines, herbs, non-prescription drugs, or dietary supplements you use. Also tell them if you smoke, drink alcohol, or use illegal drugs. Some items may interact with your medicine. What should I watch for while using this medication? Visit your healthcare provider for regular checks on your progress. Tell your healthcare professional if your symptoms do not start to get better or if they get worse. You will need regular blood tests while you are taking this medicine. Your gout may flare up when you are first taking this medicine. Do not stop taking this medicine, even if you have a flare. Your doctor or healthcare provider may give you another medicine to help treat the gout pain. This medicine may cause serious skin reactions. They can happen weeks to months after starting the medicine. Contact your healthcare provider right away if you notice fevers or flu-like symptoms with a rash. The rash may be red or purple and then turn into blisters or peeling of the skin. Or, you might notice a red rash with swelling of the face, lips, or lymph nodes in your neck or under your arms. What side effects may I notice from receiving this medication? Side effects that you should report to your doctor or health care professional as soon as possible: allergic reactions like skin rash, itching  or hives, swelling of the face, lips, or tongue breathing problems chest pain or chest tightness fast, irregular heartbeat feeling faint or lightheaded rash, fever, and swollen lymph nodes redness, blistering, peeling, or loosening of the skin, including inside the mouth signs and symptoms of liver injury like dark yellow or brown urine; general ill feeling or flu-like symptoms;  light-colored stools; loss of appetite; nausea; right upper belly pain; unusually weak or tired; yellowing of the eyes or skin signs and symptoms of a stroke like changes in vision; confusion; trouble speaking or understanding; severe headaches; sudden numbness or weakness of the face, arm or leg; trouble walking; dizziness; loss of balance or coordination Side effects that usually do not require medical attention (report to your doctor or health care professional if they continue or are bothersome): changes in appetite joint pain nausea, vomiting upset stomach This list may not describe all possible side effects. Call your doctor for medical advice about side effects. You may report side effects to FDA at 1-800-FDA-1088. Where should I keep my medication? Keep out of the reach of children. Store at room temperature between 15 and 30 degrees C (59 and 86 degrees F). Protect from light and moisture. Throw away any unused medicine after the expiration date. NOTE: This sheet is a summary. It may not cover all possible information. If you have questions about this medicine, talk to your doctor, pharmacist, or health care provider.  2022 Elsevier/Gold Standard (2019-01-01 00:00:00)

## 2021-11-22 ENCOUNTER — Ambulatory Visit (INDEPENDENT_AMBULATORY_CARE_PROVIDER_SITE_OTHER)
Admission: RE | Admit: 2021-11-22 | Discharge: 2021-11-22 | Disposition: A | Discharge status: Home / Self Care | Payer: Managed Care, Other (non HMO) | Source: Ambulatory Visit | Attending: Pulmonary Disease | Admitting: Pulmonary Disease

## 2021-11-22 ENCOUNTER — Other Ambulatory Visit: Payer: Self-pay

## 2021-11-22 DIAGNOSIS — R0602 Shortness of breath: Secondary | ICD-10-CM

## 2021-12-13 ENCOUNTER — Ambulatory Visit: Payer: Managed Care, Other (non HMO) | Admitting: Rheumatology

## 2021-12-16 ENCOUNTER — Encounter: Payer: Self-pay | Admitting: Internal Medicine

## 2021-12-16 MED ORDER — DILTIAZEM GEL 2 %: CUTANEOUS | 0 refills | Status: DC

## 2021-12-16 NOTE — Telephone Encounter (Signed)
Can try diltiazem gel 2% 2-3 times daily; pea-sized amount applied to the anal canal up to the first knuckle for fissure ?

## 2021-12-22 ENCOUNTER — Telehealth: Payer: Self-pay | Admitting: Internal Medicine

## 2021-12-22 NOTE — Telephone Encounter (Signed)
Patient called wishing to schedule an endoscopy saying he has discussed this already with Dr. Hilarie Fredrickson last year.  Does patient need an OV or can he be scheduled directly for the endoscopy?  If so, will he need a PV?  Please advise.  Thank you. ?

## 2021-12-22 NOTE — Telephone Encounter (Signed)
Ok for EGD with possible dilation for dysphagia as discussed at last OV ? ?

## 2021-12-22 NOTE — Telephone Encounter (Signed)
Patient will need an office visit since he has not been seen in over a year, and we have to have proper documentation for insurance.  ?

## 2021-12-23 ENCOUNTER — Other Ambulatory Visit: Payer: Self-pay

## 2021-12-27 ENCOUNTER — Ambulatory Visit (INDEPENDENT_AMBULATORY_CARE_PROVIDER_SITE_OTHER): Payer: Managed Care, Other (non HMO) | Admitting: Pulmonary Disease

## 2021-12-27 ENCOUNTER — Other Ambulatory Visit: Payer: Self-pay

## 2021-12-27 ENCOUNTER — Encounter: Payer: Self-pay | Admitting: Pulmonary Disease

## 2021-12-27 VITALS — BP 134/68 | HR 87 | Temp 98.0°F | Ht 74.5 in | Wt 336.0 lb

## 2021-12-27 DIAGNOSIS — R0602 Shortness of breath: Secondary | ICD-10-CM | POA: Diagnosis not present

## 2021-12-27 LAB — PULMONARY FUNCTION TEST
DL/VA % pred: 137 %
DL/VA: 6.14 ml/min/mmHg/L
DLCO cor % pred: 99 %
DLCO cor: 34.37 ml/min/mmHg
DLCO unc % pred: 99 %
DLCO unc: 34.37 ml/min/mmHg
FEF 25-75 Post: 5.21 L/sec
FEF 25-75 Pre: 2.31 L/sec
FEF2575-%Change-Post: 125 %
FEF2575-%Pred-Post: 130 %
FEF2575-%Pred-Pre: 57 %
FEV1-%Change-Post: 35 %
FEV1-%Pred-Post: 79 %
FEV1-%Pred-Pre: 58 %
FEV1-Post: 3.2 L
FEV1-Pre: 2.36 L
FEV1FVC-%Change-Post: 5 %
FEV1FVC-%Pred-Pre: 96 %
FEV6-%Change-Post: 28 %
FEV6-%Pred-Post: 79 %
FEV6-%Pred-Pre: 61 %
FEV6-Post: 3.87 L
FEV6-Pre: 3.02 L
FEV6FVC-%Pred-Post: 102 %
FEV6FVC-%Pred-Pre: 102 %
FVC-%Change-Post: 28 %
FVC-%Pred-Post: 77 %
FVC-%Pred-Pre: 60 %
FVC-Post: 3.87 L
FVC-Pre: 3.02 L
Post FEV1/FVC ratio: 83 %
Post FEV6/FVC ratio: 100 %
Pre FEV1/FVC ratio: 78 %
Pre FEV6/FVC Ratio: 100 %
RV % pred: 96 %
RV: 2.11 L
TLC % pred: 79 %
TLC: 6.24 L

## 2021-12-27 NOTE — Patient Instructions (Signed)
Glad you are stable with regard to your breathing ?Your CT scan and lung function test look okay with no clear evidence of sarcoidosis ?We will order CT chest without contrast in 1 year and follow-up in clinic ?

## 2021-12-27 NOTE — Progress Notes (Addendum)
Francis Lin    [id number]    Aug 24, 1976  Primary Care Physician:Francis Lin, Francis Sierras, MD  Referring Physician: [name], MD Francis Lin,  Francis Lin [id number]  Chief complaint: Follow-up for abnormal CT, elevated angiotensin-converting enzyme.  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  He had a work-up which included elevated ACE level CT last year showed subcentimeter pulmonary nodules  Denies any dyspnea, cough.  No rash, vision or liver issues.  He had COVID infection in 2021 and 2022 but did not require hospitalization History of right leg crush injury in 2017 requiring multiple surgeries.  He had a DVT in the setting and was anticoagulated for about 4 months  Pets: No pets Occupation: Games developer Exposures: No mold, hot tub, Jacuzzi.  No feather pillows or comforter Smoking history: Never smoker Travel history: No significant travel history Relevant family history: No family history of lung disease  Interim history: He is here for review of PFTs and CT scan.  States that breathing is doing well with no issues  Outpatient Encounter Medications as of 12/27/2021  Medication Sig   acetaminophen (TYLENOL) 325 MG tablet Take 650 mg by mouth every 6 (six) hours as needed for mild pain.   colchicine 0.6 MG tablet Take 0.5 tablets (0.3 mg total) by mouth as needed. If tolerated, increase to 0.6mg  daily after one week   cyclobenzaprine (FLEXERIL) 10 MG tablet Take 1 tablet (10 mg total) by mouth 2 (two) times daily as needed for muscle spasms.   diltiazem 2 % GEL Apply a pea size amount 2-3 times daily to the anal canal up to the first knuckle   loratadine (CLARITIN) 10 MG  tablet Take 10 mg by mouth as needed for allergies.   mometasone (NASONEX) 50 MCG/ACT nasal spray Place 2 sprays into the nose daily.   mupirocin ointment (BACTROBAN) 2 % Apply 1 application topically daily.   OVER THE COUNTER MEDICATION GoutFlex-as needed   tiZANidine (ZANAFLEX) 4 MG capsule Take 1 capsule (4 mg total) by mouth 3 (three) times daily as needed for muscle spasms.   febuxostat (ULORIC) 40 MG tablet Take 1 tablet (40 mg total) by mouth daily. (Patient not taking: Reported on 12/27/2021)   ondansetron (ZOFRAN) 4 MG tablet Take 1 tablet (4 mg total) by mouth every 8 (eight) hours as needed. (Patient not taking: Reported on 12/27/2021)   [DISCONTINUED] cimetidine (TAGAMET) 200 MG tablet Take by mouth.   No facility-administered encounter medications on file as of 12/27/2021.   Physical Exam: Blood pressure 134/68, pulse 87, temperature 98 F (36.7 C), temperature source Oral, height 6' 2.5" (1.892 m), weight (!) 336 lb (152.4 kg), SpO2 99 %. Gen:      No acute distress HEENT:  EOMI, sclera anicteric Neck:     No masses; no thyromegaly Lungs:    Clear to auscultation bilaterally; normal respiratory effort CV:         Regular rate and rhythm; no murmurs Abd:      + bowel sounds; soft, non-tender; no palpable masses, no distension Ext:    No edema; adequate peripheral perfusion Skin:      Warm and dry; no rash Neuro: alert and oriented x 3 Psych: normal  mood and affect   Data Reviewed: Imaging: CT chest 06/15/2021-interval decrease in 9 mm left lower lobe pulmonary nodule with groundglass attenuation.  Scattered bilateral pulmonary nodules measuring up to 6 mm.  No lymphadenopathy.  CT high-resolution 11/22/2021-stable lung nodules.  Previously seen left lower lobe groundglass nodule not visualized.  No interstitial lung disease on lymphadenopathy. I had reviewed the images personally.  PFTs: 12/28/2018 FVC 3.87 [77%], FEV1 3.20 [90%], F/F 83, TLC 6.24 [79%], DLCO 34.37  [99%] Minimal restriction, bronchodilator response with air trapping  Labs: Hepatic panel 11/02/2021-within normal limits Angiotensin-converting enzyme 11/02/2021-126 CCP, rheumatoid factor negative  Assessment:  Assessment for sarcoidosis Although he has elevated angiotensin-converting enzyme I suspect this is a nonspecific elevation.  His CT does not have any typical findings of sarcoidosis such as lymphadenopathy or peribronchovascular nodularity.  He does have a small groundglass nodules which has resolved on follow-up scan.  This is likely benign finding  PFTs show minimal restriction which is likely from body habitus without evidence of ILD on CT scan.  He does have a bronchodilator response and air trapping and suggestive of small airways disease but he is asymptomatic and does not need inhalers at present I reassured the patient and will get a follow-up scan in 1 year.   Plan/Recommendations: Follow-up CT in 1 year  Marshell Garfinkel MD [facility] 12/27/2021, 12:17 PM  CC: Francis Post, MD

## 2021-12-27 NOTE — Patient Instructions (Signed)
Full PFT performed today. °

## 2021-12-27 NOTE — Progress Notes (Signed)
Full PFT performed today. °

## 2022-01-03 ENCOUNTER — Encounter: Payer: Self-pay | Admitting: Pulmonary Disease

## 2022-01-31 ENCOUNTER — Encounter: Payer: Self-pay | Admitting: Internal Medicine

## 2022-01-31 ENCOUNTER — Ambulatory Visit (AMBULATORY_SURGERY_CENTER): Payer: Managed Care, Other (non HMO) | Admitting: Internal Medicine

## 2022-01-31 VITALS — BP 123/70 | HR 71 | Temp 96.6°F | Resp 14 | Ht 74.0 in | Wt 336.0 lb

## 2022-01-31 DIAGNOSIS — K2970 Gastritis, unspecified, without bleeding: Secondary | ICD-10-CM | POA: Diagnosis not present

## 2022-01-31 DIAGNOSIS — K219 Gastro-esophageal reflux disease without esophagitis: Secondary | ICD-10-CM | POA: Diagnosis not present

## 2022-01-31 DIAGNOSIS — R1310 Dysphagia, unspecified: Secondary | ICD-10-CM

## 2022-01-31 DIAGNOSIS — R1013 Epigastric pain: Secondary | ICD-10-CM

## 2022-01-31 DIAGNOSIS — K2950 Unspecified chronic gastritis without bleeding: Secondary | ICD-10-CM

## 2022-01-31 MED ORDER — SODIUM CHLORIDE 0.9 % IV SOLN: 500.0000 mL | Freq: Once | INTRAVENOUS | Status: DC

## 2022-01-31 NOTE — Op Note (Signed)
Francis Lin ?Patient Name: Francis Lin ?Procedure Date: 01/31/2022 10:06 AM ?MRN: [id number] ?Endoscopist: Jerene Bears , MD ?Age: 46 ?Referring MD:  ?Date of Birth: 1975/11/09 ?Gender: Male ?Account #: [id number] ?Procedure:                Upper GI endoscopy ?Indications:              Dysphagia, [name],  ?                          intermittent epigastric pain ?Medicines:                Monitored Anesthesia Care ?Procedure:                Pre-Anesthesia Assessment: ?                          - Prior to the procedure, a History and Physical  ?                          was performed, and patient medications and  ?                          allergies were reviewed. The patient's tolerance of  ?                          previous anesthesia was also reviewed. The risks  ?                          and benefits of the procedure and the sedation  ?                          options and risks were discussed with the patient.  ?                          All questions were answered, and informed consent  ?                          was obtained. Prior Anticoagulants: The patient has  ?                          taken no previous anticoagulant or antiplatelet  ?                          agents. ASA Grade Assessment: III - A patient with  ?                          severe systemic disease. After reviewing the risks  ?                          and benefits, the patient was deemed in  ?                          satisfactory condition to undergo the procedure. ?  After obtaining informed consent, the endoscope was  ?                          passed under direct vision. Throughout the  ?                          procedure, the patient's blood pressure, pulse, and  ?                          oxygen saturations were monitored continuously. The  ?                          GIF [id number] #[id number] was introduced through the  ?                          mouth, and advanced to the second part  of duodenum.  ?                          The upper GI endoscopy was accomplished without  ?                          difficulty. The patient tolerated the procedure  ?                          well. ?Scope In: ?Scope Out: ?Findings:                 Normal esophageal mucosa and Z-line at 43 cm. No  ?                          endoscopic abnormality was evident in the esophagus  ?                          to explain the patient's complaint of dysphagia. It  ?                          was decided, however, to proceed with dilation of  ?                          the entire esophagus. The scope was withdrawn.  ?                          Dilation was performed with a Maloney dilator with  ?                          mild resistance at 66 Fr. ?                          Localized mild inflammation characterized by  ?                          erythema and granularity was found in the  ?  prepyloric region of the stomach. Biopsies were  ?                          taken with a cold forceps for histology and  ?                          Helicobacter pylori testing. ?                          The exam of the stomach was otherwise normal. ?                          The examined duodenum was normal. ?Complications:            No immediate complications. ?Estimated Blood Loss:     Estimated blood loss was minimal. ?Impression:               - Normal esophagus. No endoscopic esophageal  ?                          abnormality to explain patient's dysphagia.  ?                          Esophagus dilated with 54 Fr. Maloney. ?                          - Mild gastritis. Biopsied. ?                          - Normal examined duodenum. ?Recommendation:           - Patient has a contact number available for  ?                          emergencies. The signs and symptoms of potential  ?                          delayed complications were discussed with the  ?                          patient. Return to normal activities  tomorrow.  ?                          Written discharge instructions were provided to the  ?                          patient. ?                          - Resume previous diet. ?                          - Continue present medications. ?                          - Given intolerance to PPIs, famotidine 20 mg  ?                          (  H2-blocker) can twice daily as needed for  ?                          heartburn symptoms. ?                          - Await pathology results to exclude H. Pylori  ?                          infection. ?Jerene Bears, MD ?01/31/2022 10:29:08 AM ?This report has been signed electronically. ?

## 2022-01-31 NOTE — Patient Instructions (Signed)
Resume previous medications.  Mild gastritis biopsied. Await results for final recommendations.   ? ?Handouts on findings given to patient.   (Gastritis) ? ?Take Famotidine 20 mg twice daily as needed for heartburn symptoms ? ?YOU HAD AN ENDOSCOPIC PROCEDURE TODAY AT [facility]:   Refer to the procedure report that was given to you for any specific questions about what was found during the examination.  If the procedure report does not answer your questions, please call your gastroenterologist to clarify.  If you requested that your care partner not be given the details of your procedure findings, then the procedure report has been included in a sealed envelope for you to review at your convenience later. ? ?YOU SHOULD EXPECT: Some feelings of bloating in the abdomen. Passage of more gas than usual.  Walking can help get rid of the air that was put into your GI tract during the procedure and reduce the bloating. If you had a lower endoscopy (such as a colonoscopy or flexible sigmoidoscopy) you may notice spotting of blood in your stool or on the toilet paper. If you underwent a bowel prep for your procedure, you may not have a normal bowel movement for a few days. ? ?Please Note:  You might notice some irritation and congestion in your nose or some drainage.  This is from the oxygen used during your procedure.  There is no need for concern and it should clear up in a day or so. ? ?SYMPTOMS TO REPORT IMMEDIATELY: ? ?Following upper endoscopy (EGD) ? Vomiting of blood or coffee ground material ? New chest pain or pain under the shoulder blades ? Painful or persistently difficult swallowing ? New shortness of breath ? Fever of 100?F or higher ? Black, tarry-looking stools ? ?For urgent or emergent issues, a gastroenterologist can be reached at any hour by calling [phone]. ?Do not use MyChart messaging for urgent concerns.  ? ? ?DIET:  We do recommend a small meal at first, but then you may  proceed to your regular diet.  Drink plenty of fluids but you should avoid alcoholic beverages for 24 hours. ? ?ACTIVITY:  You should plan to take it easy for the rest of today and you should NOT DRIVE or use heavy machinery until tomorrow (because of the sedation medicines used during the test).   ? ?FOLLOW UP: ?Our staff will call the number listed on your records 48-72 hours following your procedure to check on you and address any questions or concerns that you may have regarding the information given to you following your procedure. If we do not reach you, we will leave a message.  We will attempt to reach you two times.  During this call, we will ask if you have developed any symptoms of COVID 19. If you develop any symptoms (ie: fever, flu-like symptoms, shortness of breath, cough etc.) before then, please call [phone].  If you test positive for Covid 19 in the 2 weeks post procedure, please call and report this information to [place].   ? ?If any biopsies were taken you will be contacted by phone or by letter within the next 1-3 weeks.  Please call us at [phone] if you have not heard about the biopsies in 3 weeks.  ? ? ?SIGNATURES/CONFIDENTIALITY: ?You and/or your care partner have signed paperwork which will be entered into your electronic medical record.  These signatures attest to the fact that that the information above on your After Visit Summary  has been reviewed and is understood.  Full responsibility of the confidentiality of this discharge information lies with you and/or your care-partner.  ?

## 2022-01-31 NOTE — Progress Notes (Signed)
Called to room to assist during endoscopic procedure.  Patient ID and intended procedure confirmed with present staff. Received instructions for my participation in the procedure from the performing physician.  

## 2022-01-31 NOTE — Progress Notes (Signed)
Pt non-responsive, VVS, Report to RN  °

## 2022-01-31 NOTE — Progress Notes (Signed)
Patient ID: Francis Lin, male   DOB: 12-Oct-1975, 46 y.o.   MRN: [id number] ?  ? ?GASTROENTEROLOGY PROCEDURE H&P NOTE  ? ?Primary Care Physician: ?[name], MD ? ? ? ?Reason for Procedure:  Dysphagia, GERD, epigastric discomfort ? ?Plan:    EGD with possible dilation ? ?Patient is appropriate for endoscopic procedure(s) in the ambulatory ([facility]) setting. ? ?The nature of the procedure, as well as the risks, benefits, and alternatives were carefully and thoroughly reviewed with the patient. Ample time for discussion and questions allowed. The patient understood, was satisfied, and agreed to proceed.  ? ? ? ?HPI: ?Francis Lin is a 46 y.o. male who presents for EGD.  Medical history as below.  No recent chest pain or shortness of breath.  No abdominal pain today. ? ?Past Medical History:  ?Diagnosis Date  ? Acid reflux   ? Allergy   ? Anxiety   ? stress   ? Clotting disorder ([place])   ? right leg x 2 in calf and behind knee   ? DVT (deep venous thrombosis) ([place])   ? Esophagitis   ? Gout   ? Hypersomnia, persistent   ? IBS (irritable bowel syndrome)   ? PONV (postoperative nausea and vomiting)   ? nausea with no vomiting   ? Shifting sleep-work schedule, affecting sleep 06/20/2013  ? Snoring disorder 06/20/2013  ? Tubular adenoma of colon   ? ? ?Past Surgical History:  ?Procedure Laterality Date  ? COLONOSCOPY    ? ESOPHAGEAL DILATION    ? HEMORRHOID BANDING    ? ORIF TIBIA & FIBULA FRACTURES Right   ? right leg surgery x 2 places- rod in place knee to ankle   ? UPPER GASTROINTESTINAL ENDOSCOPY    ? wisdom teeth    ? wrist surgery Right   ? ? ?Prior to Admission medications   ?Medication Sig Start Date End Date Taking? Authorizing Provider  ?acetaminophen (TYLENOL) 325 MG tablet Take 650 mg by mouth every 6 (six) hours as needed for mild pain.   Yes [provider]  ?colchicine 0.6 MG tablet Take 0.5 tablets (0.3 mg total) by mouth as needed. If tolerated, increase to 0.19m daily after one week  11/02/21  Yes Deveshwar, SAbel Presto MD  ?cyclobenzaprine (FLEXERIL) 10 MG tablet Take 1 tablet (10 mg total) by mouth 2 (two) times daily as needed for muscle spasms. 06/01/21  Yes DLucrezia Starch MD  ?loratadine (CLARITIN) 10 MG tablet Take 10 mg by mouth as needed for allergies.   Yes [provider]  ?mometasone (NASONEX) 50 MCG/ACT nasal spray Place 2 sprays into the nose daily. 04/07/21  Yes Burchette, BAlinda Sierras MD  ?mupirocin ointment (BACTROBAN) 2 % Apply 1 application topically daily. 04/29/21  Yes Raspet, Erin K, PA-C  ?OVER THE COUNTER MEDICATION GoutFlex-as needed   Yes [provider]  ?diltiazem 2 % GEL Apply a pea size amount 2-3 times daily to the anal canal up to the first knuckle ?Patient not taking: Reported on 01/31/2022 12/16/21   PJerene Bears MD  ?febuxostat (ULORIC) 40 MG tablet Take 1 tablet (40 mg total) by mouth daily. ?Patient not taking: Reported on 12/27/2021 11/09/21   DBo Merino MD  ?ondansetron (ZOFRAN) 4 MG tablet Take 1 tablet (4 mg total) by mouth every 8 (eight) hours as needed. ?Patient not taking: Reported on 12/27/2021 02/24/21   Tegeler, CGwenyth Allegra MD  ?tiZANidine (ZANAFLEX) 4 MG capsule Take 1 capsule (4 mg total) by mouth  3 (three) times daily as needed for muscle spasms. 04/29/21   Raspet, Derry Skill, PA-C  ?cimetidine (TAGAMET) 200 MG tablet Take by mouth. 01/30/19 01/16/20  [provider]  ? ? ?Current Outpatient Medications  ?Medication Sig Dispense Refill  ? acetaminophen (TYLENOL) 325 MG tablet Take 650 mg by mouth every 6 (six) hours as needed for mild pain.    ? colchicine 0.6 MG tablet Take 0.5 tablets (0.3 mg total) by mouth as needed. If tolerated, increase to 0.52m daily after one week 30 tablet 1  ? cyclobenzaprine (FLEXERIL) 10 MG tablet Take 1 tablet (10 mg total) by mouth 2 (two) times daily as needed for muscle spasms. 20 tablet 0  ? loratadine (CLARITIN) 10 MG tablet Take 10 mg by mouth as needed for allergies.    ? mometasone  (NASONEX) 50 MCG/ACT nasal spray Place 2 sprays into the nose daily. 1 each 12  ? mupirocin ointment (BACTROBAN) 2 % Apply 1 application topically daily. 22 g 0  ? OVER THE COUNTER MEDICATION GoutFlex-as needed    ? diltiazem 2 % GEL Apply a pea size amount 2-3 times daily to the anal canal up to the first knuckle (Patient not taking: Reported on 01/31/2022) 30 g 0  ? febuxostat (ULORIC) 40 MG tablet Take 1 tablet (40 mg total) by mouth daily. (Patient not taking: Reported on 12/27/2021) 30 tablet 2  ? ondansetron (ZOFRAN) 4 MG tablet Take 1 tablet (4 mg total) by mouth every 8 (eight) hours as needed. (Patient not taking: Reported on 12/27/2021) 12 tablet 0  ? tiZANidine (ZANAFLEX) 4 MG capsule Take 1 capsule (4 mg total) by mouth 3 (three) times daily as needed for muscle spasms. 21 capsule 0  ? ?Current Facility-Administered Medications  ?Medication Dose Route Frequency Provider Last Rate Last Admin  ? 0.9 %  sodium chloride infusion  500 mL Intravenous Once Robby Pirani, JLajuan Lines MD      ? ? ?Allergies as of 01/31/2022 - Review Complete 01/31/2022  ?Allergen Reaction Noted  ? Lactose Diarrhea and Other (See Comments) 07/21/2016  ? Allopurinol  11/09/2021  ? Caffeine Other (See Comments)   ? Gabapentin  11/02/2021  ? Omeprazole Other (See Comments) 03/15/2012  ? Peanuts [peanut oil]  04/15/2012  ? Penicillins Other (See Comments)   ? Seasonal ic [cholestatin]  06/20/2013  ? ? ?Family History  ?Problem Relation Age of Onset  ? Lupus Mother   ? Heart attack Father   ? Arthritis Father   ? Hypertension Father   ? Cancer Father   ?     multiple myeloma  ? Diabetes Father   ? Atrial fibrillation Father   ? Gout Father   ? Edema Sister   ? Diabetes Sister   ? Bipolar disorder Sister   ? Stroke Sister   ? Obesity Sister   ? Adrenal disorder Sister   ? Lupus Maternal Aunt   ? Eczema Daughter   ? ADD / ADHD Daughter   ? Asthma Daughter   ? Eczema Daughter   ? ADD / ADHD Daughter   ? Hypotension Daughter   ? Eczema Daughter   ? High  Cholesterol Daughter   ? Colon cancer Neg Hx   ? Colon polyps Neg Hx   ? Esophageal cancer Neg Hx   ? Rectal cancer Neg Hx   ? Stomach cancer Neg Hx   ? ? ?Social History  ? ?Socioeconomic History  ? Marital status: Married  ?  Spouse name:  Not on file  ? Number of children: 3  ? Years of education: college  ? Highest education level: Not on file  ?Occupational History  ?  Employer: [employer]  ?  Comment: [employer]  ?Tobacco Use  ? Smoking status: Never  ? Smokeless tobacco: Never  ?Vaping Use  ? Vaping Use: Never used  ?Substance and Sexual Activity  ? Alcohol use: No  ? Drug use: No  ? Sexual activity: Not on file  ?Other Topics Concern  ? Not on file  ?Social History Narrative  ? Not on file  ? ?Social Determinants of Health  ? ?Financial Resource Strain: Not on file  ?Food Insecurity: Not on file  ?Transportation Needs: Not on file  ?Physical Activity: Not on file  ?Stress: Not on file  ?Social Connections: Not on file  ?Intimate Partner Violence: Not on file  ? ? ?Physical Exam: ?Vital signs in last 24 hours: ?'@BP'  129/75   Pulse 72   Temp (!) 96.6 ?F (35.9 ?C) (Temporal)   Ht '6\' 2"'  (1.88 m)   Wt (!) 336 lb (152.4 kg)   SpO2 99%   BMI 43.14 kg/m?  ?GEN: NAD ?EYE: Sclerae anicteric ?ENT: MMM ?CV: Non-tachycardic ?Pulm: CTA b/l ?GI: Soft, NT/ND ?NEURO:  Alert & Oriented x 3 ? ? ?Zenovia Jarred, MD ?[facility] ? ?01/31/2022 10:06 AM ? ?

## 2022-02-01 ENCOUNTER — Telehealth: Payer: Self-pay | Admitting: Family Medicine

## 2022-02-01 ENCOUNTER — Encounter: Payer: Self-pay | Admitting: Family Medicine

## 2022-02-01 ENCOUNTER — Telehealth (INDEPENDENT_AMBULATORY_CARE_PROVIDER_SITE_OTHER): Payer: Managed Care, Other (non HMO) | Admitting: Family Medicine

## 2022-02-01 ENCOUNTER — Telehealth: Payer: Self-pay | Admitting: Internal Medicine

## 2022-02-01 VITALS — Ht 74.0 in | Wt 336.0 lb

## 2022-02-01 DIAGNOSIS — R0989 Other specified symptoms and signs involving the circulatory and respiratory systems: Secondary | ICD-10-CM

## 2022-02-01 MED ORDER — DOXYCYCLINE HYCLATE 100 MG PO CAPS: 100.0000 mg | ORAL_CAPSULE | Freq: Two times a day (BID) | ORAL | 0 refills | Status: DC

## 2022-02-01 NOTE — Telephone Encounter (Signed)
Patient called, states he had EGD done yesterday. Per patient, having nausea, back pain, and chills. Please advise.  ?

## 2022-02-01 NOTE — Telephone Encounter (Signed)
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 ?

## 2022-02-01 NOTE — Progress Notes (Signed)
Patient ID: Francis Lin, male   DOB: 1976/05/22, 46 y.o.   MRN: [id number] ? ?This visit type was conducted due to national recommendations for restrictions regarding the COVID-19 pandemic in an effort to limit this patient's exposure and mitigate transmission in our community.  ? ?Virtual Visit via Video Note ? ?I connected with Audelia Acton on 02/01/22 at  3:00 PM EDT by a video enabled telemedicine application and verified that I am speaking with the correct person using two identifiers. ? Location patient: home ?Location provider:work or home office ?Persons participating in the virtual visit: patient, provider ? ?I discussed the limitations of evaluation and management by telemedicine and the availability of in person appointments. The patient expressed understanding and agreed to proceed. ? ? ?HPI: ? ?[name] is seen with onset a few days ago sore throat, cough, sinus pressure and pain and congestion.  He actually had EGD yesterday for some esophageal dysphagia.  No major stricture found.  He is swallowing without difficulty today.  He has had some chills and night sweats but no documented fever.  He took some Flonase for nasal congestion without much improvement.  Has had some colored nasal discharge.  Cough worse today. ? ? ?ROS: See pertinent positives and negatives per HPI. ? ?Past Medical History:  ?Diagnosis Date  ? Acid reflux   ? Allergy   ? Anxiety   ? stress   ? Clotting disorder ([place])   ? right leg x 2 in calf and behind knee   ? DVT (deep venous thrombosis) ([place])   ? Esophagitis   ? Gout   ? Hypersomnia, persistent   ? IBS (irritable bowel syndrome)   ? PONV (postoperative nausea and vomiting)   ? nausea with no vomiting   ? Shifting sleep-work schedule, affecting sleep 06/20/2013  ? Snoring disorder 06/20/2013  ? Tubular adenoma of colon   ? ? ?Past Surgical History:  ?Procedure Laterality Date  ? COLONOSCOPY    ? ESOPHAGEAL DILATION    ? HEMORRHOID BANDING    ? ORIF TIBIA & FIBULA FRACTURES Right    ? right leg surgery x 2 places- rod in place knee to ankle   ? UPPER GASTROINTESTINAL ENDOSCOPY    ? wisdom teeth    ? wrist surgery Right   ? ? ?Family History  ?Problem Relation Age of Onset  ? Lupus Mother   ? Heart attack Father   ? Arthritis Father   ? Hypertension Father   ? Cancer Father   ?     multiple myeloma  ? Diabetes Father   ? Atrial fibrillation Father   ? Gout Father   ? Edema Sister   ? Diabetes Sister   ? Bipolar disorder Sister   ? Stroke Sister   ? Obesity Sister   ? Adrenal disorder Sister   ? Lupus Maternal Aunt   ? Eczema Daughter   ? ADD / ADHD Daughter   ? Asthma Daughter   ? Eczema Daughter   ? ADD / ADHD Daughter   ? Hypotension Daughter   ? Eczema Daughter   ? High Cholesterol Daughter   ? Colon cancer Neg Hx   ? Colon polyps Neg Hx   ? Esophageal cancer Neg Hx   ? Rectal cancer Neg Hx   ? Stomach cancer Neg Hx   ? ? ?SOCIAL HX: Non-smoker ? ? ?Current Outpatient Medications:  ?  acetaminophen (TYLENOL) 325 MG tablet, Take 650 mg by mouth every 6 (six) hours  as needed for mild pain., Disp: , Rfl:  ?  colchicine 0.6 MG tablet, Take 0.5 tablets (0.3 mg total) by mouth as needed. If tolerated, increase to 0.29m daily after one week, Disp: 30 tablet, Rfl: 1 ?  cyclobenzaprine (FLEXERIL) 10 MG tablet, Take 1 tablet (10 mg total) by mouth 2 (two) times daily as needed for muscle spasms., Disp: 20 tablet, Rfl: 0 ?  diltiazem 2 % GEL, Apply a pea size amount 2-3 times daily to the anal canal up to the first knuckle, Disp: 30 g, Rfl: 0 ?  doxycycline (VIBRAMYCIN) 100 MG capsule, Take 1 capsule (100 mg total) by mouth 2 (two) times daily., Disp: 14 capsule, Rfl: 0 ?  febuxostat (ULORIC) 40 MG tablet, Take 1 tablet (40 mg total) by mouth daily., Disp: 30 tablet, Rfl: 2 ?  loratadine (CLARITIN) 10 MG tablet, Take 10 mg by mouth as needed for allergies., Disp: , Rfl:  ?  mometasone (NASONEX) 50 MCG/ACT nasal spray, Place 2 sprays into the nose daily., Disp: 1 each, Rfl: 12 ?  mupirocin ointment  (BACTROBAN) 2 %, Apply 1 application topically daily., Disp: 22 g, Rfl: 0 ?  ondansetron (ZOFRAN) 4 MG tablet, Take 1 tablet (4 mg total) by mouth every 8 (eight) hours as needed., Disp: 12 tablet, Rfl: 0 ?  OVER THE COUNTER MEDICATION, GoutFlex-as needed, Disp: , Rfl:  ?  tiZANidine (ZANAFLEX) 4 MG capsule, Take 1 capsule (4 mg total) by mouth 3 (three) times daily as needed for muscle spasms., Disp: 21 capsule, Rfl: 0 ? ?EXAM: ? ?VITALS per patient if applicable: ? ?GENERAL: alert, oriented, appears well and in no acute distress ? ?HEENT: atraumatic, conjunttiva clear, no obvious abnormalities on inspection of external nose and ears ? ?NECK: normal movements of the head and neck ? ?LUNGS: on inspection no signs of respiratory distress, breathing rate appears normal, no obvious gross SOB, gasping or wheezing ? ?CV: no obvious cyanosis ? ?MS: moves all visible extremities without noticeable abnormality ? ?PSYCH/NEURO: pleasant and cooperative, no obvious depression or anxiety, speech and thought processing grossly intact ? ?ASSESSMENT AND PLAN: ? ?Discussed the following assessment and plan: ? ?Acute upper respiratory symptoms with cough. ? ?-Plenty of fluids and rest ?-Continue Flonase for nasal congestion ?-Patient reluctant to try Sudafed for fear of side effects ?-We will start doxycycline 100 mg twice daily for 7 days ?-Follow-up probably for any fever or any persistent or worsening symptoms. ? ? ?  ?I discussed the assessment and treatment plan with the patient. The patient was provided an opportunity to ask questions and all were answered. The patient agreed with the plan and demonstrated an understanding of the instructions. ?  ?The patient was advised to call back or seek an in-person evaluation if the symptoms worsen or if the condition fails to improve as anticipated. ? ? ? ? ?BCarolann Littler MD  ? ?

## 2022-02-01 NOTE — Telephone Encounter (Signed)
Called patient back. He stated he has already called and spoken with his Primary.  He isn't sure if he just coming down with a cold/allergies but his Primary started him on Doxycycline.  No fever just has chills. ? ?I told patient I would forward message to Dr. Hilarie Fredrickson just so he's aware.  Procedure went well with no complications so I advised him to continue following up with his primary.  ?

## 2022-02-02 ENCOUNTER — Telehealth: Payer: Self-pay

## 2022-02-02 NOTE — Telephone Encounter (Signed)
I spoke with the pt and he stated he will need work note for yesterday as he will try to go into work today. Note has been composed and sent to pt via Mychart  ?

## 2022-02-02 NOTE — Telephone Encounter (Signed)
?  Follow up Call- ? ? ?  01/31/2022  ?  9:29 AM 01/11/2021  ? 10:00 AM  ?Call back number  ?Post procedure Call Back phone  # [phone]  ?Permission to leave phone message Yes Yes  ?  ? ?Patient questions: ? ?Do you have a fever, pain , or abdominal swelling? No. ?Pain Score  0 * ? ?Have you tolerated food without any problems? Yes.   ? ?Have you been able to return to your normal activities? Yes.   ? ?Do you have any questions about your discharge instructions: ?Diet   No. ?Medications  No. ?Follow up visit  No. ? ?Do you have questions or concerns about your Care? No. ? ?Actions: ?* If pain score is 4 or above: ?No action needed, pain <4. ? ? ?

## 2022-02-07 ENCOUNTER — Ambulatory Visit: Payer: Managed Care, Other (non HMO) | Admitting: Dietician

## 2022-02-09 ENCOUNTER — Encounter: Payer: Self-pay | Admitting: Internal Medicine

## 2022-02-10 LAB — BASIC METABOLIC PANEL
BUN: 13 (ref 4–21)
CO2: 29 — AB (ref 13–22)
Creatinine: 1.5 — AB (ref 0.6–1.3)
Glucose: 87
Sodium: 142 (ref 137–147)

## 2022-02-11 LAB — COMPREHENSIVE METABOLIC PANEL
Albumin: 4.3 (ref 3.5–5.0)
Calcium: 9.3 (ref 8.7–10.7)
Globulin: 2.5
eGFR: 59

## 2022-02-17 ENCOUNTER — Other Ambulatory Visit: Payer: Self-pay | Admitting: Nephrology

## 2022-02-18 ENCOUNTER — Other Ambulatory Visit: Payer: Self-pay | Admitting: Nephrology

## 2022-02-18 DIAGNOSIS — D869 Sarcoidosis, unspecified: Secondary | ICD-10-CM

## 2022-02-18 DIAGNOSIS — N1831 Chronic kidney disease, stage 3a: Secondary | ICD-10-CM

## 2022-02-18 DIAGNOSIS — M109 Gout, unspecified: Secondary | ICD-10-CM

## 2022-02-18 DIAGNOSIS — K589 Irritable bowel syndrome without diarrhea: Secondary | ICD-10-CM

## 2022-02-22 ENCOUNTER — Encounter: Payer: Self-pay | Admitting: Family Medicine

## 2022-02-22 ENCOUNTER — Ambulatory Visit: Payer: Managed Care, Other (non HMO) | Admitting: Internal Medicine

## 2022-02-24 ENCOUNTER — Ambulatory Visit
Admission: RE | Admit: 2022-02-24 | Discharge: 2022-02-24 | Disposition: A | Discharge status: Home / Self Care | Payer: Managed Care, Other (non HMO) | Source: Ambulatory Visit | Attending: Nephrology | Admitting: Nephrology

## 2022-02-24 DIAGNOSIS — N1831 Chronic kidney disease, stage 3a: Secondary | ICD-10-CM

## 2022-02-24 DIAGNOSIS — K589 Irritable bowel syndrome without diarrhea: Secondary | ICD-10-CM

## 2022-02-24 DIAGNOSIS — D869 Sarcoidosis, unspecified: Secondary | ICD-10-CM

## 2022-02-24 DIAGNOSIS — M109 Gout, unspecified: Secondary | ICD-10-CM

## 2022-05-09 ENCOUNTER — Encounter: Payer: Self-pay | Admitting: Family Medicine

## 2022-05-09 ENCOUNTER — Other Ambulatory Visit: Payer: Self-pay

## 2022-05-09 ENCOUNTER — Observation Stay (HOSPITAL_BASED_OUTPATIENT_CLINIC_OR_DEPARTMENT_OTHER)
Admission: EM | Admit: 2022-05-09 | Discharge: 2022-05-12 | Disposition: A | Discharge status: Home / Self Care | Payer: Managed Care, Other (non HMO) | Attending: Internal Medicine | Admitting: Internal Medicine

## 2022-05-09 ENCOUNTER — Emergency Department (HOSPITAL_BASED_OUTPATIENT_CLINIC_OR_DEPARTMENT_OTHER): Payer: Managed Care, Other (non HMO)

## 2022-05-09 ENCOUNTER — Encounter (HOSPITAL_BASED_OUTPATIENT_CLINIC_OR_DEPARTMENT_OTHER): Payer: Self-pay | Admitting: Emergency Medicine

## 2022-05-09 DIAGNOSIS — Z85038 Personal history of other malignant neoplasm of large intestine: Secondary | ICD-10-CM | POA: Insufficient documentation

## 2022-05-09 DIAGNOSIS — R030 Elevated blood-pressure reading, without diagnosis of hypertension: Secondary | ICD-10-CM | POA: Insufficient documentation

## 2022-05-09 DIAGNOSIS — R4701 Aphasia: Principal | ICD-10-CM | POA: Diagnosis present

## 2022-05-09 DIAGNOSIS — Z91010 Allergy to peanuts: Secondary | ICD-10-CM | POA: Insufficient documentation

## 2022-05-09 DIAGNOSIS — R519 Headache, unspecified: Secondary | ICD-10-CM | POA: Insufficient documentation

## 2022-05-09 DIAGNOSIS — R4789 Other speech disturbances: Secondary | ICD-10-CM | POA: Diagnosis not present

## 2022-05-09 DIAGNOSIS — E538 Deficiency of other specified B group vitamins: Secondary | ICD-10-CM | POA: Insufficient documentation

## 2022-05-09 DIAGNOSIS — R0600 Dyspnea, unspecified: Secondary | ICD-10-CM | POA: Diagnosis not present

## 2022-05-09 DIAGNOSIS — R4689 Other symptoms and signs involving appearance and behavior: Secondary | ICD-10-CM

## 2022-05-09 DIAGNOSIS — R42 Dizziness and giddiness: Secondary | ICD-10-CM | POA: Insufficient documentation

## 2022-05-09 DIAGNOSIS — Z86718 Personal history of other venous thrombosis and embolism: Secondary | ICD-10-CM | POA: Insufficient documentation

## 2022-05-09 DIAGNOSIS — N1831 Chronic kidney disease, stage 3a: Secondary | ICD-10-CM | POA: Diagnosis not present

## 2022-05-09 DIAGNOSIS — Z79899 Other long term (current) drug therapy: Secondary | ICD-10-CM | POA: Insufficient documentation

## 2022-05-09 LAB — CBC
HCT: 44.1 % (ref 39.0–52.0)
Hemoglobin: 14.9 g/dL (ref 13.0–17.0)
MCH: 29.9 pg (ref 26.0–34.0)
MCHC: 33.8 g/dL (ref 30.0–36.0)
MCV: 88.4 fL (ref 80.0–100.0)
Platelets: 247 10*3/uL (ref 150–400)
RBC: 4.99 MIL/uL (ref 4.22–5.81)
RDW: 14 % (ref 11.5–15.5)
WBC: 6.3 10*3/uL (ref 4.0–10.5)
nRBC: 0 % (ref 0.0–0.2)

## 2022-05-09 LAB — COMPREHENSIVE METABOLIC PANEL
ALT: 19 U/L (ref 0–44)
AST: 19 U/L (ref 15–41)
Albumin: 4.1 g/dL (ref 3.5–5.0)
Alkaline Phosphatase: 62 U/L (ref 38–126)
Anion gap: 8 (ref 5–15)
BUN: 15 mg/dL (ref 6–20)
CO2: 26 mmol/L (ref 22–32)
Calcium: 9.2 mg/dL (ref 8.9–10.3)
Chloride: 105 mmol/L (ref 98–111)
Creatinine, Ser: 1.58 mg/dL — ABNORMAL HIGH (ref 0.61–1.24)
GFR, Estimated: 55 mL/min — ABNORMAL LOW (ref >60)
Glucose, Bld: 94 mg/dL (ref 70–99)
Potassium: 4.3 mmol/L (ref 3.5–5.1)
Sodium: 139 mmol/L (ref 135–145)
Total Bilirubin: 0.7 mg/dL (ref 0.3–1.2)
Total Protein: 7 g/dL (ref 6.5–8.1)

## 2022-05-09 LAB — APTT: aPTT: 30 seconds (ref 24–36)

## 2022-05-09 LAB — DIFFERENTIAL
Abs Immature Granulocytes: 0.01 10*3/uL (ref 0.00–0.07)
Basophils Absolute: 0.1 10*3/uL (ref 0.0–0.1)
Basophils Relative: 1 %
Eosinophils Absolute: 0.2 10*3/uL (ref 0.0–0.5)
Eosinophils Relative: 3 %
Immature Granulocytes: 0 %
Lymphocytes Relative: 32 %
Lymphs Abs: 2 10*3/uL (ref 0.7–4.0)
Monocytes Absolute: 0.6 10*3/uL (ref 0.1–1.0)
Monocytes Relative: 10 %
Neutro Abs: 3.4 10*3/uL (ref 1.7–7.7)
Neutrophils Relative %: 54 %

## 2022-05-09 LAB — PROTIME-INR
INR: 1.2 (ref 0.8–1.2)
Prothrombin Time: 15.4 seconds — ABNORMAL HIGH (ref 11.4–15.2)

## 2022-05-09 LAB — CBG MONITORING, ED: Glucose-Capillary: 106 mg/dL — ABNORMAL HIGH (ref 70–99)

## 2022-05-09 LAB — ETHANOL: Alcohol, Ethyl (B): 10 mg/dL — ABNORMAL HIGH (ref <10)

## 2022-05-09 NOTE — ED Provider Notes (Signed)
[facility] EMERGENCY DEPT Provider Note   CSN: [id number] Arrival date & time: 05/09/22  1718     History {Add pertinent medical, surgical, social history, OB history to HPI:1} Chief Complaint  Patient presents with   Aphasia   Headache    Francis Lin is a 46 y.o. male.  HPI      46 year old male with a history of DVT, hypersomnia, who presents with concern for episodes of aphasia and headache.  3 times a week has a headache, diagnosis of migraines Has not been to a Neurologist at [facility], placed topamax for migraines 6-7 years ago but couldn't take it because working at night  Episodes started maybe a year ago, infrequent at first, over the last month has been happening more, twice a week.   Has episodes of aphasia with headache, last occurred at [place] yesterday, does feel fatigued after but also work night shift and has chronic fatigue.  Then other episodes will have hyperventilating, shortness of breath, lightheaded, chest pain when doing something strenuous or getting agitated . Otherwise able to exert self without chest pain or dyspnea, just happens occasionally.  Not on blood thinners any more (had a crush injury that led to dvt) Dad had afib No smoking, etoh, drugs      Past Medical History:  Diagnosis Date   Acid reflux    Allergy    Anxiety    stress    Clotting disorder ([facility])    right leg x 2 in calf and behind knee    DVT (deep venous thrombosis) ([facility])    Esophagitis    Gout    Hypersomnia, persistent    IBS (irritable bowel syndrome)    PONV (postoperative nausea and vomiting)    nausea with no vomiting    Shifting sleep-work schedule, affecting sleep 06/20/2013   Snoring disorder 06/20/2013   Tubular adenoma of colon      Home Medications Prior to Admission medications   Medication Sig Start Date End Date Taking? Authorizing Provider  acetaminophen (TYLENOL) 325 MG tablet Take 650 mg by mouth every 6 (six) hours as needed for  mild pain.    [provider]  colchicine 0.6 MG tablet Take 0.5 tablets (0.3 mg total) by mouth as needed. If tolerated, increase to 0.'6mg'$  daily after one week 11/02/21   Bo Merino, MD  cyclobenzaprine (FLEXERIL) 10 MG tablet Take 1 tablet (10 mg total) by mouth 2 (two) times daily as needed for muscle spasms. 06/01/21   Lucrezia Starch, MD  diltiazem 2 % GEL Apply a pea size amount 2-3 times daily to the anal canal up to the first knuckle 12/16/21   Pyrtle, Lajuan Lines, MD  doxycycline (VIBRAMYCIN) 100 MG capsule Take 1 capsule (100 mg total) by mouth 2 (two) times daily. 02/01/22   Burchette, Alinda Sierras, MD  febuxostat (ULORIC) 40 MG tablet Take 1 tablet (40 mg total) by mouth daily. 11/09/21   Bo Merino, MD  loratadine (CLARITIN) 10 MG tablet Take 10 mg by mouth as needed for allergies.    [provider]  mometasone (NASONEX) 50 MCG/ACT nasal spray Place 2 sprays into the nose daily. 04/07/21   Burchette, Alinda Sierras, MD  mupirocin ointment (BACTROBAN) 2 % Apply 1 application topically daily. 04/29/21   Raspet, Elly Haffey K, PA-C  ondansetron (ZOFRAN) 4 MG tablet Take 1 tablet (4 mg total) by mouth every 8 (eight) hours as needed. 02/24/21   Tegeler, Gwenyth Allegra, MD  OVER THE COUNTER  MEDICATION GoutFlex-as needed    [provider]  tiZANidine (ZANAFLEX) 4 MG capsule Take 1 capsule (4 mg total) by mouth 3 (three) times daily as needed for muscle spasms. 04/29/21   Raspet, Derry Skill, PA-C  cimetidine (TAGAMET) 200 MG tablet Take by mouth. 01/30/19 01/16/20  [provider]      Allergies    Lactose, Allopurinol, Caffeine, Gabapentin, Omeprazole, Peanuts [peanut oil], Penicillins, and Seasonal ic [cholestatin]    Review of Systems   Review of Systems  Physical Exam Updated Vital Signs BP 119/80   Pulse 62   Temp 97.8 F (36.6 C)   Resp 18   Ht '6\' 2"'$  (1.88 m)   Wt (!) 149.7 kg   SpO2 100%   BMI 42.37 kg/m  Physical Exam  ED Results / Procedures / Treatments    Labs (all labs ordered are listed, but only abnormal results are displayed) Labs Reviewed  PROTIME-INR - Abnormal; Notable for the following components:      Result Value   Prothrombin Time 15.4 (*)    All other components within normal limits  COMPREHENSIVE METABOLIC PANEL - Abnormal; Notable for the following components:   Creatinine, Ser 1.58 (*)    GFR, Estimated 55 (*)    All other components within normal limits  ETHANOL - Abnormal; Notable for the following components:   Alcohol, Ethyl (B) 10 (*)    All other components within normal limits  CBG MONITORING, ED - Abnormal; Notable for the following components:   Glucose-Capillary 106 (*)    All other components within normal limits  APTT  CBC  DIFFERENTIAL    EKG None  Radiology CT HEAD WO CONTRAST  Result Date: 05/09/2022 CLINICAL DATA:  Slurred speech, headache. EXAM: CT HEAD WITHOUT CONTRAST TECHNIQUE: Contiguous axial images were obtained from the base of the skull through the vertex without intravenous contrast. RADIATION DOSE REDUCTION: This exam was performed according to the departmental dose-optimization program which includes automated exposure control, adjustment of the mA and/or kV according to patient size and/or use of iterative reconstruction technique. COMPARISON:  March 22, 2020. FINDINGS: Brain: No evidence of acute infarction, hemorrhage, hydrocephalus, extra-axial collection or mass lesion/mass effect. Vascular: No hyperdense vessel or unexpected calcification. Skull: Normal. Negative for fracture or focal lesion. Sinuses/Orbits: No acute finding. Other: None. IMPRESSION: No acute intracranial abnormality seen. Electronically Signed   By: Marijo Conception M.D.   On: 05/09/2022 17:44    Procedures Procedures  {Document cardiac monitor, telemetry assessment procedure when appropriate:1}  Medications Ordered in ED Medications - No data to display  ED Course/ Medical Decision Making/ A&P                            Medical Decision Making Amount and/or Complexity of Data Reviewed Labs: ordered. Radiology: ordered.   ***  {Document critical care time when appropriate:1} {Document review of labs and clinical decision tools ie heart score, Chads2Vasc2 etc:1}  {Document your independent review of radiology images, and any outside records:1} {Document your discussion with family members, caretakers, and with consultants:1} {Document social determinants of health affecting pt's care:1} {Document your decision making why or why not admission, treatments were needed:1} Final Clinical Impression(s) / ED Diagnoses Final diagnoses:  None    Rx / DC Orders ED Discharge Orders     None

## 2022-05-09 NOTE — ED Triage Notes (Signed)
Pt arrives to ED with c/o slurred speech and headache. Pt reports he had an episode of slurred speech yesterday at 3pm accompanied by a headache that lasted around 5 minutes and resolved. He reports the headache has continued and he feels "drained" and "fatigued." He reports this occurs frequently, sometimes twice weekly. Hx migraines.

## 2022-05-09 NOTE — Telephone Encounter (Signed)
Spoke with patient, scheduled in next available 8/1 '@1015'$ 

## 2022-05-10 ENCOUNTER — Observation Stay (HOSPITAL_COMMUNITY): Payer: Managed Care, Other (non HMO)

## 2022-05-10 ENCOUNTER — Emergency Department (HOSPITAL_BASED_OUTPATIENT_CLINIC_OR_DEPARTMENT_OTHER): Payer: Managed Care, Other (non HMO)

## 2022-05-10 ENCOUNTER — Ambulatory Visit: Payer: Managed Care, Other (non HMO) | Admitting: Family Medicine

## 2022-05-10 ENCOUNTER — Emergency Department (HOSPITAL_COMMUNITY): Payer: Managed Care, Other (non HMO)

## 2022-05-10 DIAGNOSIS — N1831 Chronic kidney disease, stage 3a: Secondary | ICD-10-CM | POA: Diagnosis not present

## 2022-05-10 DIAGNOSIS — R4701 Aphasia: Secondary | ICD-10-CM | POA: Diagnosis present

## 2022-05-10 DIAGNOSIS — R519 Headache, unspecified: Secondary | ICD-10-CM

## 2022-05-10 DIAGNOSIS — R0600 Dyspnea, unspecified: Secondary | ICD-10-CM | POA: Diagnosis not present

## 2022-05-10 DIAGNOSIS — R4789 Other speech disturbances: Secondary | ICD-10-CM | POA: Diagnosis not present

## 2022-05-10 DIAGNOSIS — R42 Dizziness and giddiness: Secondary | ICD-10-CM | POA: Diagnosis not present

## 2022-05-10 DIAGNOSIS — Z79899 Other long term (current) drug therapy: Secondary | ICD-10-CM | POA: Diagnosis not present

## 2022-05-10 DIAGNOSIS — R030 Elevated blood-pressure reading, without diagnosis of hypertension: Secondary | ICD-10-CM | POA: Diagnosis not present

## 2022-05-10 DIAGNOSIS — Z91010 Allergy to peanuts: Secondary | ICD-10-CM | POA: Diagnosis not present

## 2022-05-10 DIAGNOSIS — Z85038 Personal history of other malignant neoplasm of large intestine: Secondary | ICD-10-CM | POA: Diagnosis not present

## 2022-05-10 DIAGNOSIS — Z86718 Personal history of other venous thrombosis and embolism: Secondary | ICD-10-CM | POA: Diagnosis not present

## 2022-05-10 DIAGNOSIS — E538 Deficiency of other specified B group vitamins: Secondary | ICD-10-CM | POA: Diagnosis not present

## 2022-05-10 LAB — TROPONIN I (HIGH SENSITIVITY)
Troponin I (High Sensitivity): 3 ng/L (ref <18)
Troponin I (High Sensitivity): 3 ng/L (ref <18)

## 2022-05-10 LAB — TSH: TSH: 2.314 u[IU]/mL (ref 0.350–4.500)

## 2022-05-10 LAB — FOLATE: Folate: 11.2 ng/mL (ref >5.9)

## 2022-05-10 LAB — VITAMIN B12: Vitamin B-12: 223 pg/mL (ref 180–914)

## 2022-05-10 LAB — D-DIMER, QUANTITATIVE: D-Dimer, Quant: 0.4 ug/mL-FEU (ref 0.00–0.50)

## 2022-05-10 MED ORDER — ACETAMINOPHEN 325 MG PO TABS: 650.0000 mg | ORAL_TABLET | Freq: Four times a day (QID) | ORAL | Status: DC | PRN

## 2022-05-10 MED ORDER — TRAMADOL HCL 50 MG PO TABS: 50.0000 mg | ORAL_TABLET | Freq: Three times a day (TID) | ORAL | Status: DC | PRN

## 2022-05-10 MED ORDER — GADOBUTROL 1 MMOL/ML IV SOLN
10.0000 mL | Freq: Once | INTRAVENOUS | Status: AC | PRN
  Administered 2022-05-10: 10 mL via INTRAVENOUS

## 2022-05-10 MED ORDER — METOCLOPRAMIDE HCL 5 MG/ML IJ SOLN
10.0000 mg | Freq: Once | INTRAMUSCULAR | Status: AC
  Administered 2022-05-10: 10 mg via INTRAVENOUS
  Filled 2022-05-10: qty 2

## 2022-05-10 MED ORDER — POLYETHYLENE GLYCOL 3350 17 G PO PACK: 17.0000 g | PACK | Freq: Every day | ORAL | Status: DC | PRN

## 2022-05-10 MED ORDER — ONDANSETRON HCL 4 MG PO TABS: 4.0000 mg | ORAL_TABLET | Freq: Three times a day (TID) | ORAL | Status: DC | PRN

## 2022-05-10 MED ORDER — ENOXAPARIN SODIUM 40 MG/0.4ML IJ SOSY
40.0000 mg | PREFILLED_SYRINGE | INTRAMUSCULAR | Status: DC
Start: 2022-05-10 — End: 2022-05-13
  Administered 2022-05-10 – 2022-05-11 (×2): 40 mg via SUBCUTANEOUS
  Filled 2022-05-10 (×3): qty 0.4

## 2022-05-10 MED ORDER — FEBUXOSTAT 40 MG PO TABS
40.0000 mg | ORAL_TABLET | Freq: Every day | ORAL | Status: DC
  Filled 2022-05-10 (×3): qty 1

## 2022-05-10 MED ORDER — ONDANSETRON HCL 4 MG/2ML IJ SOLN
4.0000 mg | Freq: Once | INTRAMUSCULAR | Status: AC
Start: 2022-05-10 — End: 2022-05-10
  Administered 2022-05-10: 4 mg via INTRAVENOUS
  Filled 2022-05-10: qty 2

## 2022-05-10 MED ORDER — TIZANIDINE HCL 4 MG PO TABS: 4.0000 mg | ORAL_TABLET | Freq: Three times a day (TID) | ORAL | Status: DC | PRN

## 2022-05-10 NOTE — Discharge Instructions (Addendum)
Per [facility] statutes, patients with suspected seizures are not allowed to drive until they have been seizure-free for six months. Do not use heavy equipment or power tools. Avoid working on ladders or at heights. Take showers instead of baths. Ensure the water temperature is not too high on the home water heater. Do not go swimming alone. Do not lock yourself in a room alone (i.e. bathroom). When caring for infants or small children, sit down when holding, feeding, or changing them to minimize risk of injury to the child in the event you have a seizure. Maintain good sleep hygiene. Avoid alcohol.

## 2022-05-10 NOTE — ED Notes (Signed)
Back from MRI.

## 2022-05-10 NOTE — Progress Notes (Signed)
LTM maint complete - no skin breakdown [facility] monitored, Event button test confirmed by [facility]. ? ?

## 2022-05-10 NOTE — Telephone Encounter (Signed)
Patient has been hospitalized and visit cancelled for today

## 2022-05-10 NOTE — Progress Notes (Signed)
LTM EEG hooked up and running - no initial skin breakdown - push button tested - neuro notified.  

## 2022-05-10 NOTE — ED Notes (Signed)
Called phlebotomy for labs.

## 2022-05-10 NOTE — ED Triage Notes (Addendum)
Pt BIB by [facility] from [facility] for MRI. Labs and Head CT unremarkable. Pt does complain of some chest pain, back/kidney and stomach pain that kind of wraps around   See [facility] triage note below:  "Pt arrives to ED with c/o slurred speech and headache. Pt reports he had an episode of slurred speech yesterday at 3pm accompanied by a headache that lasted around 5 minutes and resolved. He reports the headache has continued and he feels "drained" and "fatigued." He reports this occurs frequently, sometimes twice weekly. Hx migraines".

## 2022-05-10 NOTE — Consult Note (Signed)
NEUROLOGY CONSULTATION NOTE   Date of service: May 10, 2022 Patient Name: Francis Lin MRN:  [id number] DOB:  [birth year] Reason for consult: "episodes of dizziness and trouble finding words" Requesting Provider: Quintella Reichert, MD _ _ _   _ __   _ __ _ _  __ __   _ __   __ _  History of Present Illness  Francis Lin is a 46 y.o. male with PMH significant for gout, IBS, history of DVTs, snoring, GERD who presents to the ED with episodes of dizziness along with hard times finding words/cannot put the words together.  Reports that the episodes are completely random.  He does feel that stress is triggered to some of these episodes but not all.  He has had episodes when he is not stressed at all.  He also reports that lack of sleep is probably contributing to these 2.  Typical episode lasts 5 to 10 minutes.  Typically has episodes 2-3 times a week. However, has had 2 in he last 2 days.  This started about 2 years ago but they were not as frequent in the past.  He feels that trying to relax and rest helps with these episodes.  He reports that some of these episodes are progressed to hyperventilation.  He sometimes has a headache with some migrainous features and an aura.  After the episode, he is exhausted and just wants to go to sleep.  Endorses history of seizures in his sister and his daughter.  Denies any significant head injury with loss of consciousness recently, no history of CNS infection, no CNS surgery, no ICH. Does not drink alcohol, does not use any recreational susbtances.   ROS   Constitutional Denies weight loss, fever and chills.   HEENT Denies changes in vision and hearing.   Respiratory Denies SOB and cough.   CV Denies palpitations and CP   GI Denies abdominal pain, nausea, vomiting and diarrhea.   GU Denies dysuria and urinary frequency.   MSK Denies myalgia and joint pain.   Skin Denies rash and pruritus.   Neurological Denies headache and syncope.   Psychiatric  Denies recent changes in mood. Denies anxiety and depression.    Past History   Past Medical History:  Diagnosis Date   Acid reflux    Allergy    Anxiety    stress    Clotting disorder (HCC)    right leg x 2 in calf and behind knee    DVT (deep venous thrombosis) (HCC)    Esophagitis    Gout    Hypersomnia, persistent    IBS (irritable bowel syndrome)    PONV (postoperative nausea and vomiting)    nausea with no vomiting    Shifting sleep-work schedule, affecting sleep 06/20/2013   Snoring disorder 06/20/2013   Tubular adenoma of colon    Past Surgical History:  Procedure Laterality Date   COLONOSCOPY     ESOPHAGEAL DILATION     HEMORRHOID BANDING     ORIF TIBIA & FIBULA FRACTURES Right    right leg surgery x 2 places- rod in place knee to ankle    UPPER GASTROINTESTINAL ENDOSCOPY     wisdom teeth     wrist surgery Right    Family History  Problem Relation Age of Onset   Lupus Mother    Heart attack Father    Arthritis Father    Hypertension Father    Cancer Father  multiple myeloma   Diabetes Father    Atrial fibrillation Father    Gout Father    Edema Sister    Diabetes Sister    Bipolar disorder Sister    Stroke Sister    Obesity Sister    Adrenal disorder Sister    Lupus Maternal Aunt    Eczema Daughter    ADD / ADHD Daughter    Asthma Daughter    Eczema Daughter    ADD / ADHD Daughter    Hypotension Daughter    Eczema Daughter    High Cholesterol Daughter    Colon cancer Neg Hx    Colon polyps Neg Hx    Esophageal cancer Neg Hx    Rectal cancer Neg Hx    Stomach cancer Neg Hx    Social History   Socioeconomic History   Marital status: Married    Spouse name: Not on file   Number of children: 3   Years of education: college   Highest education level: Not on file  Occupational History    Employer: [employer]    Comment: [employer]  Tobacco Use   Smoking status: Never   Smokeless tobacco: Never  Vaping Use   Vaping  Use: Never used  Substance and Sexual Activity   Alcohol use: No   Drug use: No   Sexual activity: Not on file  Other Topics Concern   Not on file  Social History Narrative   Not on file   Social Determinants of Health   Financial Resource Strain: Not on file  Food Insecurity: Not on file  Transportation Needs: Not on file  Physical Activity: Not on file  Stress: Not on file  Social Connections: Not on file   Allergies  Allergen Reactions   Lactose Diarrhea and Other (See Comments)    Lactose intolerance Lactose intolerance Lactose intolerance   Allopurinol     rash   Caffeine Other (See Comments)    "makes feel bad", headaches   Gabapentin    Omeprazole Other (See Comments)    Mood swing   Peanuts [Peanut Oil]    Penicillins Other (See Comments)    Headaches    Seasonal Ic [Cholestatin]     Sneezing,runny nose , itchy eyes    Medications  (Not in a hospital admission)    Vitals   Vitals:   05/10/22 0030 05/10/22 0200 05/10/22 0240 05/10/22 0300  BP: (!) 140/91 129/86 (!) 135/92 (!) 137/93  Pulse: 60 67 61 62  Resp: '16 16 15 17  ' Temp:  97.7 F (36.5 C)    TempSrc:  Oral    SpO2: 90% 100% 100% 100%  Weight:      Height:         Body mass index is 42.37 kg/m.  Physical Exam   General: Laying comfortably in bed; in no acute distress.  HENT: Normal oropharynx and mucosa. Normal external appearance of ears and nose.  Neck: Supple, no pain or tenderness  CV: No JVD. No peripheral edema.  Pulmonary: Symmetric Chest rise. Normal respiratory effort.  Abdomen: Soft to touch, non-tender.  Ext: No cyanosis, edema, or deformity  Skin: No rash. Normal palpation of skin.   Musculoskeletal: Normal digits and nails by inspection. No clubbing.   Neurologic Examination  Mental status/Cognition: Alert, oriented to self, place, month and year, good attention.  Speech/language: Fluent, comprehension intact, object naming intact, repetition intact.  Cranial  nerves:   CN II Pupils equal and reactive to  light, no VF deficits    CN III,IV,VI EOM intact, no gaze preference or deviation, no nystagmus    CN V normal sensation in V1, V2, and V3 segments bilaterally    CN VII no asymmetry, no nasolabial fold flattening    CN VIII normal hearing to speech    CN IX & X normal palatal elevation, no uvular deviation    CN XI 5/5 head turn and 5/5 shoulder shrug bilaterally    CN XII midline tongue protrusion    Motor:  Muscle bulk: normal, tone normal, pronator drift none tremor none Mvmt Root Nerve  Muscle Right Left Comments  SA C5/6 Ax Deltoid 5 5   EF C5/6 Mc Biceps 5 5   EE C6/7/8 Rad Triceps 5 5   WF C6/7 Med FCR     WE C7/8 PIN ECU     F Ab C8/T1 U ADM/FDI 5 5   HF L1/2/3 Fem Illopsoas 5 5   KE L2/3/4 Fem Quad 5 5   DF L4/5 D Peron Tib Ant 5 5   PF S1/2 Tibial Grc/Sol 5 5    Reflexes:  Right Left Comments  Pectoralis      Biceps (C5/6) 2 2   Brachioradialis (C5/6) 2 2    Triceps (C6/7) 2 2    Patellar (L3/4) 2 2    Achilles (S1)      Hoffman      Plantar     Jaw jerk    Sensation:  Light touch Intact throughout   Pin prick    Temperature    Vibration   Proprioception    Coordination/Complex Motor:  - Finger to Nose intact bilaterally - Heel to shin intact bilaterally - Rapid alternating movement are normal - Gait: deferred.  Labs   CBC:  Recent Labs  Lab 05/09/22 1730  WBC 6.3  NEUTROABS 3.4  HGB 14.9  HCT 44.1  MCV 88.4  PLT 741    Basic Metabolic Panel:  Lab Results  Component Value Date   NA 139 05/09/2022   K 4.3 05/09/2022   CO2 26 05/09/2022   GLUCOSE 94 05/09/2022   BUN 15 05/09/2022   CREATININE 1.58 (H) 05/09/2022   CALCIUM 9.2 05/09/2022   GFRNONAA 55 (L) 05/09/2022   GFRAA 58 (L) 03/22/2020   Lipid Panel:  Lab Results  Component Value Date   LDLCALC 80 02/17/2020   HgbA1c:  Lab Results  Component Value Date   HGBA1C 5.8 02/22/2021   Urine Drug Screen: No results found for:  "LABOPIA", "COCAINSCRNUR", "LABBENZ", "AMPHETMU", "THCU", "LABBARB"  Alcohol Level     Component Value Date/Time   ETH 10 (H) 05/09/2022 1730    CT Head without contrast(Personally reviewed): CTH was negative for a large hypodensity concerning for a large territory infarct or hyperdensity concerning for an ICH  MRI Brain(Personally reviewed): No acute intracranial abnormalities.  cEEG:  pending  Impression   Francis Lin is a 46 y.o. male with PMH significant for gout, IBS, history of DVTs, snoring, GERD who presents to the ED with episodes of dizziness along with hard times finding words/cannot put the words together. The episodes are difficulty to clinically characterize and unclear if these truly are seizures, other differrential includes panic attack, hypoglycemia, vasovagal syncope, orthostatic hypotension, complex migraine, parasomnia. 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 He has had 2 episodes in the last 2 days and so hoeful that we can capture a spell in a reasonable amount of time.  Recommendations  - cEEG for spell capture. - TSH, B12, folate. ______________________________________________________________________   Thank you for the opportunity to take part in the care of this patient. If you have any further questions, please contact the neurology consultation attending.  Signed,  [place] Pager Number [phone] _ _ _   _ __   _ __ _ _  __ __   _ __   __ _

## 2022-05-10 NOTE — Progress Notes (Signed)
Pt moved to 3W08 from ED with EEG equipment.  [facility] Monitored

## 2022-05-10 NOTE — H&P (Signed)
ADMISSION HISTORY AND PHYSICAL   Francis Lin WCH:[id number] DOB: July 14, 1976 DOA: 05/09/2022  PCP: [name], MD Patient coming from: home via [facility] ED  Chief Complaint: HA w/ aphasia   HPI:  46 year old with a history of gout, IBS, and DVTs who presented to the ED reporting episodes 2-3 times a week during which he feels dizzy and has word finding difficulty.  He stated these episodes typically last 5-10 minutes, and have been occurring for 2 years, but are becoming more frequently of late.  Some of the episodes are associated with a generalized headache but not all.  He denies any recent head injury, loss of consciousness, meningitis/CNS infection, CNS surgery, alcohol abuse, or abuse of illicit substances.  He did report to having significant ongoing difficulty with insomnia.  MRI of the brain in the ER was without acute findings.  Assessment/Plan  Transient recurrent aphasia - rule out seizure Primary elements on the differential include seizure versus migraine complex -neurology to monitor on continuous EEG  History of recurrent DVT Lovenox for DVT prophylaxis  Gout No evidence of acute flare at present -continue as needed colchicine  Mildly elevated blood pressure Likely a consequence of the stress of admission -monitor trend  CKD stage IIIa Review of records suggests baseline GFR at 55-59 for at least the past 6 months suggesting an element of CKD  Irritable bowel syndrome Appears stable at present  DVT prophylaxis: Lovenox Code Status: Full code Family Communication: Discussed care with significant other at bedside Disposition Plan: Place in observation for continuous EEG Consults called: none indicated  Review of Systems: As per HPI otherwise 10 point review of systems negative.   Past Medical History:  Diagnosis Date   Acid reflux    Allergy    Anxiety    stress    Clotting disorder (HCC)    right leg x 2 in calf and behind knee    DVT (deep  venous thrombosis) (HCC)    Esophagitis    Gout    Hypersomnia, persistent    IBS (irritable bowel syndrome)    PONV (postoperative nausea and vomiting)    nausea with no vomiting    Shifting sleep-work schedule, affecting sleep 06/20/2013   Snoring disorder 06/20/2013   Tubular adenoma of colon     Past Surgical History:  Procedure Laterality Date   COLONOSCOPY     ESOPHAGEAL DILATION     HEMORRHOID BANDING     ORIF TIBIA & FIBULA FRACTURES Right    right leg surgery x 2 places- rod in place knee to ankle    UPPER GASTROINTESTINAL ENDOSCOPY     wisdom teeth     wrist surgery Right     Family History  Family History  Problem Relation Age of Onset   Lupus Mother    Heart attack Father    Arthritis Father    Hypertension Father    Cancer Father        multiple myeloma   Diabetes Father    Atrial fibrillation Father    Gout Father    Edema Sister    Diabetes Sister    Bipolar disorder Sister    Stroke Sister    Obesity Sister    Adrenal disorder Sister    Lupus Maternal Aunt    Eczema Daughter    ADD / ADHD Daughter    Asthma Daughter    Eczema Daughter    ADD / ADHD Daughter    Hypotension Daughter  Eczema Daughter    High Cholesterol Daughter    Colon cancer Neg Hx    Colon polyps Neg Hx    Esophageal cancer Neg Hx    Rectal cancer Neg Hx    Stomach cancer Neg Hx     Social History   reports that he has never smoked. He has never used smokeless tobacco. He reports that he does not drink alcohol and does not use drugs.  Allergies Allergies  Allergen Reactions   Lactose Diarrhea and Other (See Comments)    Lactose intolerance Lactose intolerance Lactose intolerance   Allopurinol     rash   Caffeine Other (See Comments)    "makes feel bad", headaches   Gabapentin    Omeprazole Other (See Comments)    Mood swing   Peanuts [Peanut Oil]    Penicillins Other (See Comments)    Headaches    Seasonal Ic [Cholestatin]     Sneezing,runny nose ,  itchy eyes    Prior to Admission medications   Medication Sig Start Date End Date Taking? Authorizing Provider  acetaminophen (TYLENOL) 325 MG tablet Take 650 mg by mouth every 6 (six) hours as needed for mild pain.    [provider]  colchicine 0.6 MG tablet Take 0.5 tablets (0.3 mg total) by mouth as needed. If tolerated, increase to 0.10m daily after one week 11/02/21   DBo Merino MD  cyclobenzaprine (FLEXERIL) 10 MG tablet Take 1 tablet (10 mg total) by mouth 2 (two) times daily as needed for muscle spasms. 06/01/21   DLucrezia Starch MD  diltiazem 2 % GEL Apply a pea size amount 2-3 times daily to the anal canal up to the first knuckle 12/16/21   Pyrtle, JLajuan Lines MD  doxycycline (VIBRAMYCIN) 100 MG capsule Take 1 capsule (100 mg total) by mouth 2 (two) times daily. 02/01/22   Burchette, BAlinda Sierras MD  febuxostat (ULORIC) 40 MG tablet Take 1 tablet (40 mg total) by mouth daily. 11/09/21   DBo Merino MD  loratadine (CLARITIN) 10 MG tablet Take 10 mg by mouth as needed for allergies.    [provider]  mometasone (NASONEX) 50 MCG/ACT nasal spray Place 2 sprays into the nose daily. 04/07/21   Burchette, BAlinda Sierras MD  mupirocin ointment (BACTROBAN) 2 % Apply 1 application topically daily. 04/29/21   Raspet, Erin K, PA-C  ondansetron (ZOFRAN) 4 MG tablet Take 1 tablet (4 mg total) by mouth every 8 (eight) hours as needed. 02/24/21   Tegeler, CGwenyth Allegra MD  OVER THE COUNTER MEDICATION GoutFlex-as needed    [provider]  tiZANidine (ZANAFLEX) 4 MG capsule Take 1 capsule (4 mg total) by mouth 3 (three) times daily as needed for muscle spasms. 04/29/21   Raspet, EDerry Skill PA-C  cimetidine (TAGAMET) 200 MG tablet Take by mouth. 01/30/19 01/16/20  [provider]    Physical Exam: Vitals:   05/10/22 0300 05/10/22 0630 05/10/22 0645 05/10/22 0700  BP: (!) 137/93 130/85 (!) 139/93 121/82  Pulse: 62 64 (!) 58 64  Resp: 17  15   Temp:      TempSrc:       SpO2: 100% 99% 99% 100%  Weight:      Height:        Constitutional: NAD, calm, comfortable Eyes: PERRL, lids and conjunctivae normal ENMT: Mucous membranes are moist.  Respiratory: clear to auscultation bilaterally, no wheezing, no crackles. Normal respiratory effort. No accessory muscle use.  Cardiovascular: Regular rate and rhythm,  no murmurs / rubs / gallops. No extremity edema. 2+ pedal pulses.  Abdomen: No tenderness or masses to palpation. No hepatosplenomegaly. Bowel sounds positive. Not distended. Soft.  Musculoskeletal: No clubbing / cyanosis. No joint deformity upper and lower extremities. No contractures. Normal muscle tone.  Neurologic: CN 2-12 grossly intact B  Psychiatric: Normal judgment and insight. Alert and oriented x 3. Normal mood.    Labs on Admission:   CBC: Recent Labs  Lab 05/09/22 1730  WBC 6.3  NEUTROABS 3.4  HGB 14.9  HCT 44.1  MCV 88.4  PLT 263   Basic Metabolic Panel: Recent Labs  Lab 05/09/22 1730  NA 139  K 4.3  CL 105  CO2 26  GLUCOSE 94  BUN 15  CREATININE 1.58*  CALCIUM 9.2   GFR: Estimated Creatinine Clearance: 91.2 mL/min (A) (by C-G formula based on SCr of 1.58 mg/dL (H)).  Liver Function Tests: Recent Labs  Lab 05/09/22 1730  AST 19  ALT 19  ALKPHOS 62  BILITOT 0.7  PROT 7.0  ALBUMIN 4.1   Coagulation Profile: Recent Labs  Lab 05/09/22 1730  INR 1.2    CBG: Recent Labs  Lab 05/09/22 1731  GLUCAP 106*    Urine analysis:    Component Value Date/Time   COLORURINE YELLOW 05/31/2021 1335   APPEARANCEUR CLEAR 05/31/2021 1335   LABSPEC 1.015 05/31/2021 1335   PHURINE 6.0 05/31/2021 1335   GLUCOSEU NEGATIVE 05/31/2021 1335   HGBUR NEGATIVE 05/31/2021 1335   BILIRUBINUR NEGATIVE 05/31/2021 1335   BILIRUBINUR neg 04/25/2014 1308   [place] 05/31/2021 1335   PROTEINUR NEGATIVE 05/31/2021 1335   UROBILINOGEN 1.0 09/07/2014 1610   NITRITE NEGATIVE 05/31/2021 1335   LEUKOCYTESUR NEGATIVE  05/31/2021 1335     Radiological Exams on Admission: MR Brain W and Wo Contrast  Result Date: 05/10/2022 CLINICAL DATA:  Acute stroke suspected. Episodes of aphasia and headache. History of migraine EXAM: MRI HEAD WITHOUT AND WITH CONTRAST TECHNIQUE: Multiplanar, multiecho pulse sequences of the brain and surrounding structures were obtained without and with intravenous contrast. CONTRAST:  93m GADAVIST GADOBUTROL 1 MMOL/ML IV SOLN COMPARISON:  Head CT from yesterday.  Brain MRI 04/22/2020 FINDINGS: Brain: No acute infarction, hemorrhage, hydrocephalus, extra-axial collection or mass lesion. No white matter disease or brain atrophy. Mildly accentuated CSF along the temporal poles, stable and without mass effect. Vascular: Major flow voids and vascular enhancements are preserved. Diffusely enhancing dural sinuses. Skull and upper cervical spine: Normal marrow signal Sinuses/Orbits: Negative IMPRESSION: Stable and negative brain MRI.  No explanation for headache. Electronically Signed   By: JJorje GuildM.D.   On: 05/10/2022 04:13   DG Chest Portable 1 View  Result Date: 05/10/2022 CLINICAL DATA:  Shortness of breath. EXAM: PORTABLE CHEST 1 VIEW COMPARISON:  Chest radiograph dated 05/31/2021. FINDINGS: The heart size and mediastinal contours are within normal limits. Both lungs are clear. The visualized skeletal structures are unremarkable. IMPRESSION: No active disease. Electronically Signed   By: AAnner CreteM.D.   On: 05/10/2022 00:31   CT HEAD WO CONTRAST  Result Date: 05/09/2022 CLINICAL DATA:  Slurred speech, headache. EXAM: CT HEAD WITHOUT CONTRAST TECHNIQUE: Contiguous axial images were obtained from the base of the skull through the vertex without intravenous contrast. RADIATION DOSE REDUCTION: This exam was performed according to the departmental dose-optimization program which includes automated exposure control, adjustment of the mA and/or kV according to patient size and/or use of  iterative reconstruction technique. COMPARISON:  March 22, 2020. FINDINGS: Brain:  No evidence of acute infarction, hemorrhage, hydrocephalus, extra-axial collection or mass lesion/mass effect. Vascular: No hyperdense vessel or unexpected calcification. Skull: Normal. Negative for fracture or focal lesion. Sinuses/Orbits: No acute finding. Other: None. IMPRESSION: No acute intracranial abnormality seen. Electronically Signed   By: Marijo Conception M.D.   On: 05/09/2022 17:44     Cherene Altes, MD [facility] Office  [phone] Pager - Text Page per Amion as per below:  On-Call/Text Page:      [email or web address]  If 7PM-7AM, please contact night-coverage [email or web address] 05/10/2022, 7:38 AM

## 2022-05-10 NOTE — Progress Notes (Signed)
Arrived from Emergency Department to 3W for admission.  EEG staff called and contacted to notify of patient moving from Emergency Department to 3W08.  EEG leads remain in place. ED staff brought up computer for EEG, called EEG staff to notify that equipment brought up as well.

## 2022-05-10 NOTE — Plan of Care (Signed)
LTM EEG reviewed till 1150.  No seizures.  Please review final report for details.  Raffi Milstein Barbra Sarks

## 2022-05-10 NOTE — ED Provider Notes (Signed)
Pt transferred for MRI.  MRI negative for acute abnormality.  Discussed with neurologist, who evaluated the patient in the emergency department.  Recommend patient to admit to medicine service with continuous EEG.  Medicine consulted for admission.   Quintella Reichert, MD 05/10/22 [phone]

## 2022-05-10 NOTE — ED Notes (Signed)
ED TO INPATIENT HANDOFF REPORT  ED Nurse Name and Phone #: [phone]  S Name/Age/Gender Francis Lin 46 y.o. male Room/Bed: 009C/009C  Code Status   Code Status: Full Code  Home/SNF/Other Home Patient oriented to: self, place, time, and situation Is this baseline? Yes   Triage Complete: Triage complete  Chief Complaint Aphasia [R47.01]  Triage Note Pt arrives to ED with c/o slurred speech and headache. Pt reports he had an episode of slurred speech yesterday at 3pm accompanied by a headache that lasted around 5 minutes and resolved. He reports the headache has continued and he feels "drained" and "fatigued." He reports this occurs frequently, sometimes twice weekly. Hx migraines.   Pt BIB by [facility] from [facility] for MRI. Labs and Head CT unremarkable. Pt does complain of some chest pain, back/kidney and stomach pain that kind of wraps around   See [facility] triage note below:  "Pt arrives to ED with c/o slurred speech and headache. Pt reports he had an episode of slurred speech yesterday at 3pm accompanied by a headache that lasted around 5 minutes and resolved. He reports the headache has continued and he feels "drained" and "fatigued." He reports this occurs frequently, sometimes twice weekly. Hx migraines".    Allergies Allergies  Allergen Reactions   Peanuts [Peanut Oil] Anaphylaxis   Lactose Diarrhea and Other (See Comments)    Lactose intolerance Lactose intolerance Lactose intolerance   Allopurinol     rash   Caffeine Other (See Comments)    "makes feel bad", headaches   Gabapentin Other (See Comments)    Messes with memory   Ibuprofen Other (See Comments)    Early onset of kidney issues (Stage 3A)   Omeprazole Other (See Comments)    Mood swing   Penicillins Other (See Comments)    Headaches    Seasonal Ic [Cholestatin]     Sneezing,runny nose , itchy eyes    Level of Care/Admitting Diagnosis ED Disposition     ED Disposition  Admit    Condition  --   [place]: [facility] [[id number]]  Level of Care: Telemetry Medical [104]  May place patient in observation at [facility] or [place] if equivalent level of care is available:: No  Covid Evaluation: Asymptomatic - no recent exposure (last 10 days) testing not required  Diagnosis: Aphasia [784.3.ICD-9-CM]  Admitting Physician: Thereasa Solo, JEFFREY T [[id number]]  Attending Physician: Thereasa Solo, JEFFREY T [[id number]]          B Medical/Surgery History Past Medical History:  Diagnosis Date   Acid reflux    Allergy    Anxiety    stress    Clotting disorder ([place])    right leg x 2 in calf and behind knee    DVT (deep venous thrombosis) (H[facility])    Esophagitis    Gout    Hypersomnia, persistent    IBS (irritable bowel syndrome)    PONV (postoperative nausea and vomiting)    nausea with no vomiting    Shifting sleep-work schedule, affecting sleep 06/20/2013   Snoring disorder 06/20/2013   Tubular adenoma of colon    Past Surgical History:  Procedure Laterality Date   COLONOSCOPY     ESOPHAGEAL DILATION     HEMORRHOID BANDING     ORIF TIBIA & FIBULA FRACTURES Right    right leg surgery x 2 places- rod in place knee to ankle    UPPER GASTROINTESTINAL ENDOSCOPY     wisdom teeth  wrist surgery Right      A IV Location/Drains/Wounds Patient Lines/Drains/Airways Status     Active Line/Drains/Airways     Name Placement date Placement time Site Days   Peripheral IV 05/09/22 20 G 1" Left Antecubital 05/09/22  1728  Antecubital  1            Intake/Output Last 24 hours No intake or output data in the 24 hours ending 05/10/22 1437  Labs/Imaging Results for orders placed or performed during the hospital encounter of 05/09/22 (from the past 48 hour(s))  Protime-INR     Status: Abnormal   Collection Time: 05/09/22  5:30 PM  Result Value Ref Range   Prothrombin Time 15.4 (H) 11.4 - 15.2 seconds   INR 1.2 0.8 - 1.2    Comment:  (NOTE) INR goal varies based on device and disease states. Performed at [facility], [address], [place], [place] [address]   APTT     Status: None   Collection Time: 05/09/22  5:30 PM  Result Value Ref Range   aPTT 30 24 - 36 seconds    Comment: Performed at [facility], [address], [place], [place] [address]  CBC     Status: None   Collection Time: 05/09/22  5:30 PM  Result Value Ref Range   WBC 6.3 4.0 - 10.5 K/uL   RBC 4.99 4.22 - 5.81 MIL/uL   Hemoglobin 14.9 13.0 - 17.0 g/dL   HCT 44.1 39.0 - 52.0 %   MCV 88.4 80.0 - 100.0 fL   MCH 29.9 26.0 - 34.0 pg   MCHC 33.8 30.0 - 36.0 g/dL   RDW 14.0 11.5 - 15.5 %   Platelets 247 150 - 400 K/uL   nRBC 0.0 0.0 - 0.2 %    Comment: Performed at [facility], [address], [place], [place] [address]  Differential     Status: None   Collection Time: 05/09/22  5:30 PM  Result Value Ref Range   Neutrophils Relative % 54 %   Neutro Abs 3.4 1.7 - 7.7 K/uL   Lymphocytes Relative 32 %   Lymphs Abs 2.0 0.7 - 4.0 K/uL   Monocytes Relative 10 %   Monocytes Absolute 0.6 0.1 - 1.0 K/uL   Eosinophils Relative 3 %   Eosinophils Absolute 0.2 0.0 - 0.5 K/uL   Basophils Relative 1 %   Basophils Absolute 0.1 0.0 - 0.1 K/uL   Immature Granulocytes 0 %   Abs Immature Granulocytes 0.01 0.00 - 0.07 K/uL    Comment: Performed at [facility], [place], [place] [address]  Comprehensive metabolic panel     Status: Abnormal   Collection Time: 05/09/22  5:30 PM  Result Value Ref Range   Sodium 139 135 - 145 mmol/L   Potassium 4.3 3.5 - 5.1 mmol/L   Chloride 105 98 - 111 mmol/L   CO2 26 22 - 32 mmol/L   Glucose, Bld 94 70 - 99 mg/dL    Comment: Glucose reference range applies only to samples taken after fasting for at least 8 hours.   BUN 15 6 - 20 mg/dL   Creatinine, Ser 1.58 (H) 0.61 - 1.24 mg/dL   Calcium 9.2 8.9 - 10.3 mg/dL   Total Protein  7.0 6.5 - 8.1 g/dL   Albumin 4.1 3.5 - 5.0 g/dL   AST 19 15 - 41 U/L   ALT 19 0 - 44 U/L   Alkaline Phosphatase 62 38 -  126 U/L   Total Bilirubin 0.7 0.3 - 1.2 mg/dL   GFR, Estimated 55 (L) >60 mL/min    Comment: (NOTE) Calculated using the CKD-EPI Creatinine Equation (2021)    Anion gap 8 5 - 15    Comment: Performed at [facility], [address] [place], [place] [address]  Ethanol     Status: Abnormal   Collection Time: 05/09/22  5:30 PM  Result Value Ref Range   Alcohol, Ethyl (B) 10 (H) <10 mg/dL    Comment: (NOTE) Lowest detectable limit for serum alcohol is 10 mg/dL.  For medical purposes only. Performed at [facility], [address], [place], [place] [address]   Troponin I (High Sensitivity)     Status: None   Collection Time: 05/09/22  5:30 PM  Result Value Ref Range   Troponin I (High Sensitivity) 3 <18 ng/L    Comment: (NOTE) Elevated high sensitivity troponin I (hsTnI) values and significant  changes across serial measurements may suggest ACS but many other  chronic and acute conditions are known to elevate hsTnI results.  Refer to the "Links" section for chest pain algorithms and additional  guidance. Performed at [facility], [address] [place], [place] [address]   D-dimer, quantitative     Status: None   Collection Time: 05/09/22  5:30 PM  Result Value Ref Range   D-Dimer, Quant 0.40 0.00 - 0.50 ug/mL-FEU    Comment: (NOTE) At the manufacturer cut-off value of 0.5 g/mL FEU, this assay has a negative predictive value of 95-100%.This assay is intended for use in conjunction with a clinical pretest probability (PTP) assessment model to exclude pulmonary embolism (PE) and deep venous thrombosis (DVT) in outpatients suspected of PE or DVT. Results should be correlated with clinical presentation. Performed at [facility], [address], [place], [place] [address]    CBG monitoring, ED     Status: Abnormal   Collection Time: 05/09/22  5:31 PM  Result Value Ref Range   Glucose-Capillary 106 (H) 70 - 99 mg/dL    Comment: Glucose reference range applies only to samples taken after fasting for at least 8 hours.  Troponin I (High Sensitivity)     Status: None   Collection Time: 05/10/22  2:59 AM  Result Value Ref Range   Troponin I (High Sensitivity) 3 <18 ng/L    Comment: (NOTE) Elevated high sensitivity troponin I (hsTnI) values and significant  changes across serial measurements may suggest ACS but many other  chronic and acute conditions are known to elevate hsTnI results.  Refer to the "Links" section for chest pain algorithms and additional  guidance. Performed at [facility], [place] [address], [place], [place] [address]   TSH     Status: None   Collection Time: 05/10/22  6:58 AM  Result Value Ref Range   TSH 2.314 0.350 - 4.500 uIU/mL    Comment: Performed by a 3rd Generation assay with a functional sensitivity of <=0.01 uIU/mL. Performed at [facility], [place] [address] [place], [place] [address]   Folate     Status: None   Collection Time: 05/10/22  6:58 AM  Result Value Ref Range   Folate 11.2 >5.9 ng/mL    Comment: Performed at [facility] [address], [place], [place] [address]  Vitamin B12     Status: None   Collection Time: 05/10/22  6:58 AM  Result Value Ref Range   Vitamin B-12 223  180 - 914 pg/mL    Comment: (NOTE) This assay is not validated for testing neonatal or myeloproliferative syndrome specimens for Vitamin B12 levels. Performed at [facility], [place] [address], [place], [place] [address]    MR Brain W and Wo Contrast  Result Date: 05/10/2022 CLINICAL DATA:  Acute stroke suspected. Episodes of aphasia and headache. History of migraine EXAM: MRI HEAD WITHOUT AND WITH CONTRAST TECHNIQUE: Multiplanar, multiecho pulse sequences of the brain and surrounding structures were obtained without and  with intravenous contrast. CONTRAST:  70m GADAVIST GADOBUTROL 1 MMOL/ML IV SOLN COMPARISON:  Head CT from yesterday.  Brain MRI 04/22/2020 FINDINGS: Brain: No acute infarction, hemorrhage, hydrocephalus, extra-axial collection or mass lesion. No white matter disease or brain atrophy. Mildly accentuated CSF along the temporal poles, stable and without mass effect. Vascular: Major flow voids and vascular enhancements are preserved. Diffusely enhancing dural sinuses. Skull and upper cervical spine: Normal marrow signal Sinuses/Orbits: Negative IMPRESSION: Stable and negative brain MRI.  No explanation for headache. Electronically Signed   By: JJorje GuildM.D.   On: 05/10/2022 04:13   DG Chest Portable 1 View  Result Date: 05/10/2022 CLINICAL DATA:  Shortness of breath. EXAM: PORTABLE CHEST 1 VIEW COMPARISON:  Chest radiograph dated 05/31/2021. FINDINGS: The heart size and mediastinal contours are within normal limits. Both lungs are clear. The visualized skeletal structures are unremarkable. IMPRESSION: No active disease. Electronically Signed   By: AAnner CreteM.D.   On: 05/10/2022 00:31   CT HEAD WO CONTRAST  Result Date: 05/09/2022 CLINICAL DATA:  Slurred speech, headache. EXAM: CT HEAD WITHOUT CONTRAST TECHNIQUE: Contiguous axial images were obtained from the base of the skull through the vertex without intravenous contrast. RADIATION DOSE REDUCTION: This exam was performed according to the departmental dose-optimization program which includes automated exposure control, adjustment of the mA and/or kV according to patient size and/or use of iterative reconstruction technique. COMPARISON:  March 22, 2020. FINDINGS: Brain: No evidence of acute infarction, hemorrhage, hydrocephalus, extra-axial collection or mass lesion/mass effect. Vascular: No hyperdense vessel or unexpected calcification. Skull: Normal. Negative for fracture or focal lesion. Sinuses/Orbits: No acute finding. Other: None. IMPRESSION:  No acute intracranial abnormality seen. Electronically Signed   By: JMarijo ConceptionM.D.   On: 05/09/2022 17:44    Pending Labs Unresulted Labs (From admission, onward)     Start     Ordered   05/11/22 0500  HIV Antibody (routine testing w rflx)  (HIV Antibody (Routine testing w reflex) panel)  Tomorrow morning,   R        05/10/22 0756   05/11/22 07619 Basic metabolic panel  Tomorrow morning,   R        05/10/22 0756   05/11/22 0500  CBC  Tomorrow morning,   R        05/10/22 0756            Vitals/Pain Today's Vitals   05/10/22 0752 05/10/22 0946 05/10/22 1100 05/10/22 1326  BP:   133/69 115/69  Pulse:   63 (!) 59  Resp:   17 19  Temp:  98.1 F (36.7 C)  97.7 F (36.5 C)  TempSrc:  Oral    SpO2:   97% 100%  Weight:      Height:      PainSc: 0-No pain       Isolation Precautions No active isolations  Medications Medications  acetaminophen (TYLENOL) tablet 650 mg (has no administration in time range)  febuxostat (ULORIC) tablet 40 mg (has no administration in time range)  ondansetron (ZOFRAN) tablet 4 mg (has no administration in time range)  tiZANidine (ZANAFLEX) tablet 4 mg (has no administration in time range)  enoxaparin (LOVENOX) injection 40 mg (40 mg Subcutaneous Given 05/10/22 0942)  polyethylene glycol (MIRALAX / GLYCOLAX) packet 17 g (has no administration in time range)  traMADol (ULTRAM) tablet 50 mg (has no administration in time range)  ondansetron (ZOFRAN) injection 4 mg (4 mg Intravenous Given 05/10/22 0054)  gadobutrol (GADAVIST) 1 MMOL/ML injection 10 mL (10 mLs Intravenous Contrast Given 05/10/22 0351)  metoCLOPramide (REGLAN) injection 10 mg (10 mg Intravenous Given 05/10/22 0701)    Mobility walks Low fall risk   Focused Assessments Neuro Assessment Handoff:  Swallow screen pass? Yes    NIH Stroke Scale ( + Modified Stroke Scale Criteria)  Interval: Shift assessment Level of Consciousness (1a.)   : Alert, keenly responsive LOC Questions  (1b. )   +: Answers both questions correctly LOC Commands (1c. )   + : Performs both tasks correctly Best Gaze (2. )  +: Normal Visual (3. )  +: No visual loss Facial Palsy (4. )    : Normal symmetrical movements Motor Arm, Left (5a. )   +: No drift Motor Arm, Right (5b. )   +: No drift Motor Leg, Left (6a. )   +: No drift Motor Leg, Right (6b. )   +: No drift Limb Ataxia (7. ): Absent Sensory (8. )   +: Mild-to-moderate sensory loss, patient feels pinprick is less sharp or is dull on the affected side, or there is a loss of superficial pain with pinprick, but patient is aware of being touched (Numbness "difference in sensation" on the left side of face and arm) Best Language (9. )   +: No aphasia Dysarthria (10. ): Normal Extinction/Inattention (11.)   +: No Abnormality Modified SS Total  +: 1 Complete NIHSS TOTAL: 1 Last date known well: 05/08/22 Last time known well: 1500 Neuro Assessment: Exceptions to WDL Neuro Checks:   Initial (05/09/22 2124)  Last Documented NIHSS Modified Score: 1 (05/09/22 2124) Has TPA been given? No If patient is a Neuro Trauma and patient is going to OR before floor call report to [place] nurse: [phone] or [phone]   R Recommendations: See Admitting Provider Note  Report given to:   Additional Notes:

## 2022-05-10 NOTE — ED Notes (Signed)
To MRI

## 2022-05-11 DIAGNOSIS — R4789 Other speech disturbances: Secondary | ICD-10-CM | POA: Diagnosis not present

## 2022-05-11 DIAGNOSIS — R519 Headache, unspecified: Secondary | ICD-10-CM | POA: Diagnosis not present

## 2022-05-11 DIAGNOSIS — R4701 Aphasia: Secondary | ICD-10-CM | POA: Diagnosis not present

## 2022-05-11 DIAGNOSIS — R2990 Unspecified symptoms and signs involving the nervous system: Secondary | ICD-10-CM

## 2022-05-11 DIAGNOSIS — R4689 Other symptoms and signs involving appearance and behavior: Secondary | ICD-10-CM

## 2022-05-11 LAB — CBC
HCT: 45.3 % (ref 39.0–52.0)
Hemoglobin: 15.4 g/dL (ref 13.0–17.0)
MCH: 30 pg (ref 26.0–34.0)
MCHC: 34 g/dL (ref 30.0–36.0)
MCV: 88.3 fL (ref 80.0–100.0)
Platelets: 234 10*3/uL (ref 150–400)
RBC: 5.13 MIL/uL (ref 4.22–5.81)
RDW: 13.6 % (ref 11.5–15.5)
WBC: 6.2 10*3/uL (ref 4.0–10.5)
nRBC: 0 % (ref 0.0–0.2)

## 2022-05-11 LAB — BASIC METABOLIC PANEL
Anion gap: 8 (ref 5–15)
BUN: 12 mg/dL (ref 6–20)
CO2: 24 mmol/L (ref 22–32)
Calcium: 8.7 mg/dL — ABNORMAL LOW (ref 8.9–10.3)
Chloride: 106 mmol/L (ref 98–111)
Creatinine, Ser: 1.36 mg/dL — ABNORMAL HIGH (ref 0.61–1.24)
GFR, Estimated: >60 mL/min (ref >60)
Glucose, Bld: 82 mg/dL (ref 70–99)
Potassium: 3.8 mmol/L (ref 3.5–5.1)
Sodium: 138 mmol/L (ref 135–145)

## 2022-05-11 LAB — HIV ANTIBODY (ROUTINE TESTING W REFLEX): HIV Screen 4th Generation wRfx: NONREACTIVE

## 2022-05-11 MED ORDER — CYANOCOBALAMIN 1000 MCG/ML IJ SOLN
1000.0000 ug | Freq: Every day | INTRAMUSCULAR | Status: AC
Start: 2022-05-11 — End: 2022-05-12
  Administered 2022-05-11 – 2022-05-12 (×2): 1000 ug via SUBCUTANEOUS
  Filled 2022-05-11 (×2): qty 1

## 2022-05-11 NOTE — Progress Notes (Signed)
  Transition of Care [facility]) Screening Note   Patient Details  Name: Francis Lin Date of Birth: 23-Feb-1976   Transition of Care [facility]) CM/SW Contact:    Pollie Friar, RN Phone Number: 05/11/2022, 3:45 PM    Transition of Care Department [facility]) has reviewed patient and no TOC needs have been identified at this time. We will continue to monitor patient advancement through interdisciplinary progression rounds. If new patient transition needs arise, please place a TOC consult.

## 2022-05-11 NOTE — Plan of Care (Signed)

## 2022-05-11 NOTE — Progress Notes (Signed)
Francis Lin  ZDG:[id number] DOB: Dec 05, 1975 DOA: 05/09/2022 PCP: [name], MD    Brief Narrative:  46 year old with a history of gout, IBS, and DVTs who presented to the ED reporting episodes 2-3 times a week during which he feels dizzy and has word finding difficulty.  He stated these episodes typically last 5-10 minutes, and have been occurring for 2 years, but are becoming more frequently of late.  Some of the episodes are associated with a generalized headache but not all.  He denies any recent head injury, loss of consciousness, meningitis/CNS infection, CNS surgery, alcohol abuse, or abuse of illicit substances.  He did report to having significant ongoing difficulty with insomnia.  MRI of the brain in the ER was without acute findings.  Consultants:  Neurology  Goals of Care:  Code Status: Full Code   DVT prophylaxis: Lovenox  Interim Hx: Resting comfortably in bed.  States he has not had any of his episodes thus far while connected to the EEG monitor.  Denies any new complaints.  Is beginning to be anxious about going home.  Assessment & Plan:  Recurrent transient aphasia - rule out seizure Primary elements on the differential include seizure versus migraine complex - neurology to monitor on continuous EEG - timing of discontinuation of EEG to be determined by neurology    History of recurrent DVT Lovenox for DVT prophylaxis -is not on chronic anticoagulation -appears his prior history was related to an injury   Gout No evidence of acute flare at present -continue as needed colchicine and scheduled Uloric   Mildly elevated blood pressure Likely a consequence of the stress of admission -blood pressure has normalized without treatment   CKD stage IIIa Review of records suggests baseline GFR at 55-59 for at least the past 6 months suggesting an element of CKD  B12 deficiency B12 level found to be low at 223 -initiated subcutaneous supplementation -transition  to oral supplementation at time of discharge - will need outpatient follow-up in 6-8 weeks to assure that he is absorbing it adequately via the oral route   Irritable bowel syndrome Appears stable at present   Family Communication: No family present at time of exam today Disposition: Awaiting neurology clearance for discharge -plan is for discharge home   Objective: Blood pressure 121/74, pulse 64, temperature 98 F (36.7 C), temperature source Oral, resp. rate 16, height '6\' 2"'$  (1.88 m), weight (!) 149.7 kg, SpO2 99 %.  Intake/Output Summary (Last 24 hours) at 05/11/2022 0827 Last data filed at 05/11/2022 0400 Gross per 24 hour  Intake --  Output 475 ml  Net -475 ml   Filed Weights   05/09/22 1725  Weight: (!) 149.7 kg    Examination: General: No acute respiratory distress Lungs: Clear to auscultation bilaterally without wheezes or crackles Cardiovascular: Regular rate and rhythm without murmur gallop or rub normal S1 and S2 Abdomen: Nontender, nondistended, soft, bowel sounds positive, no rebound, no ascites, no appreciable mass Extremities: No significant cyanosis, clubbing, or edema bilateral lower extremities  CBC: Recent Labs  Lab 05/09/22 1730  WBC 6.3  NEUTROABS 3.4  HGB 14.9  HCT 44.1  MCV 88.4  PLT 951   Basic Metabolic Panel: Recent Labs  Lab 05/09/22 1730  NA 139  K 4.3  CL 105  CO2 26  GLUCOSE 94  BUN 15  CREATININE 1.58*  CALCIUM 9.2   GFR: Estimated Creatinine Clearance: 91.2 mL/min (A) (by C-G formula based on SCr of 1.58 mg/dL (  H)).  Liver Function Tests: Recent Labs  Lab 05/09/22 1730  AST 19  ALT 19  ALKPHOS 62  BILITOT 0.7  PROT 7.0  ALBUMIN 4.1    HbA1C: Hgb A1c MFr Bld  Date/Time Value Ref Range Status  02/22/2021 10:14 AM 5.8 4.6 - 6.5 % Final    Comment:    Glycemic Control Guidelines for People with Diabetes:Non Diabetic:  <6%Goal of Therapy: <7%Additional Action Suggested:  >8%   02/17/2020 11:36 AM 5.5 4.6 - 6.5 %  Final    Comment:    Glycemic Control Guidelines for People with Diabetes:Non Diabetic:  <6%Goal of Therapy: <7%Additional Action Suggested:  >8%      LOS: 0 days   Cherene Altes, MD [facility] Office  [phone] Pager - Text Page per Shea Evans  If 7PM-7AM, please contact night-coverage per Amion 05/11/2022, 8:27 AM

## 2022-05-11 NOTE — Procedures (Signed)
Patient Name: Francis Lin  MRN: [id number]  Epilepsy Attending: Lora Havens  Referring Physician/Provider: Donnetta Simpers, MD  Duration: 05/10/2022 1025 to 05/11/2022 1025  Patient history:  46 y.o. male with PMH significant for gout, IBS, history of DVTs, snoring, GERD who presents to the ED with episodes of dizziness along with hard times finding words/cannot put the words together.  EEG to evaluate for seizure.  Level of alertness: Awake, asleep  AEDs during EEG study: None  Technical aspects: This EEG study was done with scalp electrodes positioned according to the 10-20 International system of electrode placement. Electrical activity was reviewed with band pass filter of 1-'70Hz'$ , sensitivity of 7 uV/mm, display speed of 7m/sec with a '60Hz'$  notched filter applied as appropriate. EEG data were recorded continuously and digitally stored.  Video monitoring was available and reviewed as appropriate.  Description: The posterior dominant rhythm consists of 9 Hz activity of moderate voltage (25-35 uV) seen predominantly in posterior head regions, symmetric and reactive to eye opening and eye closing. Sleep was characterized by vertex waves, sleep spindles (12 to 14 Hz), maximal frontocentral region. Hyperventilation and photic stimulation were not performed.     Event button was pressed on 05/11/2022 at 0904.  Patient states he came back from the bathroom and his chest did not feel right.  Concomitant EEG before, during and after the event did not show any EEG changes to suggest seizure.   IMPRESSION: This study is within normal limits. No seizures or epileptiform discharges were seen throughout the recording.  One event was recorded as described above without concomitant EEG change.  This was not an epileptic event.   Shital Crayton OBarbra Sarks

## 2022-05-11 NOTE — Progress Notes (Signed)
Neurology Progress Note   S:// Patient sitting up in bed in NAD. No family at the bedside. LTM still going, with no seizures identified. He reports having a little dizziness coming out of the bathroom today, not his typical spells, no other spells he is aware of. He thinks stress is a trigger for these spells. Also he was told that they maybe from his migraines. He works night shift as a Games developer. Denies headache currently, states he had one when he came to the hospital   O:// Current vital signs: BP 128/80 (BP Location: Right Arm)   Pulse 65   Temp 98.1 F (36.7 C) (Oral)   Resp 16   Ht '6\' 2"'$  (1.88 m)   Wt (!) 149.7 kg   SpO2 98%   BMI 42.37 kg/m  Vital signs in last 24 hours: Temp:  [97.7 F (36.5 C)-98.5 F (36.9 C)] 98.1 F (36.7 C) (08/02 1100) Pulse Rate:  [59-68] 65 (08/02 1100) Resp:  [16-22] 16 (08/02 1100) BP: (112-128)/(69-83) 128/80 (08/02 1100) SpO2:  [98 %-100 %] 98 % (08/02 1100)  GENERAL: Awake, alert in NAD HEENT: - Normocephalic and atraumatic, dry mm LUNGS - Clear to auscultation bilaterally with no wheezes CV - S1S2 RRR, no m/r/g, equal pulses bilaterally. ABDOMEN - Soft, nontender, nondistended with normoactive BS Ext: warm, well perfused, intact peripheral pulses, no edema  NEURO:  Mental Status: AA&Ox3  Language: speech is clear.  Naming, repetition, fluency, and comprehension intact. Cranial Nerves: PERRL, EOMI, visual fields full, no facial asymmetry, facial sensation intact, hearing intact, tongue/uvula/soft palate midline, normal sternocleidomastoid and trapezius muscle strength. No evidence of tongue atrophy or fibrillations Motor: 5/5 in all 4 extremities Tone: is normal and bulk is normal Sensation- Intact to light touch bilaterally Coordination: FTN intact bilaterally, no ataxia in BLE. Gait- deferred    Medications  Current Facility-Administered Medications:    acetaminophen (TYLENOL) tablet 650 mg, 650 mg, Oral, Q6H PRN,  Cherene Altes, MD   cyanocobalamin (VITAMIN B12) injection 1,000 mcg, 1,000 mcg, Subcutaneous, Daily, Joette Catching T, MD, 1,000 mcg at 05/11/22 0931   enoxaparin (LOVENOX) injection 40 mg, 40 mg, Subcutaneous, Q24H, Cherene Altes, MD, 40 mg at 05/11/22 3474   febuxostat (ULORIC) tablet 40 mg, 40 mg, Oral, Daily, Joette Catching T, MD   ondansetron [facility]) tablet 4 mg, 4 mg, Oral, Q8H PRN, Cherene Altes, MD   polyethylene glycol (MIRALAX / GLYCOLAX) packet 17 g, 17 g, Oral, Daily PRN, Cherene Altes, MD   tiZANidine (ZANAFLEX) tablet 4 mg, 4 mg, Oral, TID PRN, Cherene Altes, MD   traMADol Veatrice Bourbon) tablet 50 mg, 50 mg, Oral, Q8H PRN, Cherene Altes, MD Labs CBC    Component Value Date/Time   WBC 6.2 05/11/2022 0645   RBC 5.13 05/11/2022 0645   HGB 15.4 05/11/2022 0645   HCT 45.3 05/11/2022 0645   PLT 234 05/11/2022 0645   MCV 88.3 05/11/2022 0645   MCV 95.5 05/05/2013 1614   MCH 30.0 05/11/2022 0645   MCHC 34.0 05/11/2022 0645   RDW 13.6 05/11/2022 0645   LYMPHSABS 2.0 05/09/2022 1730   MONOABS 0.6 05/09/2022 1730   EOSABS 0.2 05/09/2022 1730   BASOSABS 0.1 05/09/2022 1730    CMP     Component Value Date/Time   NA 138 05/11/2022 0645   NA 142 02/10/2022 0000   K 3.8 05/11/2022 0645   CL 106 05/11/2022 0645   CO2 24 05/11/2022 0645   GLUCOSE 82 05/11/2022  0645   BUN 12 05/11/2022 0645   BUN 13 02/10/2022 0000   CREATININE 1.36 (H) 05/11/2022 0645   CREATININE 1.49 (H) 11/02/2021 1117   CALCIUM 8.7 (L) 05/11/2022 0645   PROT 7.0 05/09/2022 1730   PROT 6.6 04/13/2018 0000   ALBUMIN 4.1 05/09/2022 1730   ALBUMIN 3.8 04/13/2018 0000   AST 19 05/09/2022 1730   ALT 19 05/09/2022 1730   ALKPHOS 62 05/09/2022 1730   BILITOT 0.7 05/09/2022 1730   BILITOT 0.4 04/13/2018 0000   GFRNONAA >60 05/11/2022 0645   GFRAA 58 (L) 03/22/2020 1719    glycosylated hemoglobin  Lipid Panel     Component Value Date/Time   CHOL 128 02/17/2020 1136    CHOL 132 04/13/2018 0000   TRIG 76.0 02/17/2020 1136   HDL 33.30 (L) 02/17/2020 1136   HDL 35 (L) 04/13/2018 0000   CHOLHDL 4 02/17/2020 1136   VLDL 15.2 02/17/2020 1136   LDLCALC 80 02/17/2020 1136   LDLCALC 85 04/13/2018 0000     Imaging I have reviewed images in epic and the results pertinent to this consultation are:  CT-scan of the brain- no acute abnormality  MRI examination of the brain- no acute process   EEG 8/1 no seizures EEG 8/1-8/2: This study is within normal limits. No seizures or epileptiform discharges were seen throughout the recording.   One event was recorded as described above without concomitant EEG change.  This was not an epileptic event.   LABS: Vit B12 223 Folate 11.2 TSH 2.314  Assessment:  46 y.o. male with PMH significant for gout, IBS, history of DVTs, snoring, GERD who presents to the ED with episodes of dizziness along with hard times finding words/cannot put the words together.  reporting episodes 2-3 times a week during which he feels dizzy and has word finding difficulty.  He stated these episodes typically last 5-10 minutes, and have been occurring for 2 years, but are becoming more frequently of late.  Some of the episodes are associated with a generalized headache but not all.  He is admitted for spell characterization. He has not had any typical spells since admission. He had one brief spell of dizziness returning from the bathroom this AM with no concommitant change on EEG.   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 If no further spells and no epileptiform abnl on EEG can d/c EEG tmrw and likely discharge home if he has no other medical indication to stay - Driving restrictions x6 mos after last spell with altered consciousness  Will f/u EEG results tmrw  Note written by Beulah Gandy NP and edited by MD as needed  Neurology Attending Attestation   I examined the patient and discussed  plan with Ms. Rogers Blocker NP. Above note has been edited by me to reflect my findings and recommendations.    Su Monks, MD [facility] [phone]   If 7pm- 7am, please page neurology on call as listed in [place].

## 2022-05-12 DIAGNOSIS — R4789 Other speech disturbances: Secondary | ICD-10-CM

## 2022-05-12 DIAGNOSIS — R4701 Aphasia: Secondary | ICD-10-CM | POA: Diagnosis not present

## 2022-05-12 DIAGNOSIS — R2990 Unspecified symptoms and signs involving the nervous system: Secondary | ICD-10-CM | POA: Diagnosis not present

## 2022-05-12 MED ORDER — CYANOCOBALAMIN 1000 MCG PO TABS: 1000.0000 ug | ORAL_TABLET | Freq: Every day | ORAL | Status: AC

## 2022-05-12 MED ORDER — VITAMIN B-12 1000 MCG PO TABS: 1000.0000 ug | ORAL_TABLET | Freq: Every day | ORAL | Status: DC

## 2022-05-12 NOTE — Progress Notes (Signed)
Nursing Discharge Note   Admit Date: 05/09/2022  Discharge date: 05/12/2022   Francis Lin is to be discharged home per MD order.  AVS completed. Reviewed with patient and family at bedside. Highlighted copy provided for patient to take home.  Patient able to verbalize understanding of discharge instructions. PIV removed. Patient stable upon discharge.  Seizure precautions reviewed with patient.   Discharge Instructions     Ambulatory referral to Neurology   Complete by: As directed    An appointment is requested in approximately: 6 wks      Allergies as of 05/12/2022       Reactions   Peanuts [peanut Oil] Anaphylaxis   Lactose Diarrhea, Other (See Comments)   Lactose intolerance Lactose intolerance Lactose intolerance   Allopurinol    rash   Caffeine Other (See Comments)   "makes feel bad", headaches   Gabapentin Other (See Comments)   Messes with memory   Ibuprofen Other (See Comments)   Early onset of kidney issues (Stage 3A)   Omeprazole Other (See Comments)   Mood swing   Penicillins Other (See Comments)   Headaches   Seasonal Ic [cholestatin]    Sneezing,runny nose , itchy eyes        Medication List     STOP taking these medications    doxycycline 100 MG capsule Commonly known as: VIBRAMYCIN   febuxostat 40 MG tablet Commonly known as: ULORIC       TAKE these medications    acetaminophen 325 MG tablet Commonly known as: TYLENOL Take 650 mg by mouth every 6 (six) hours as needed for mild pain.   colchicine 0.6 MG tablet Take 0.5 tablets (0.3 mg total) by mouth as needed. If tolerated, increase to 0.'6mg'$  daily after one week What changed:  when to take this reasons to take this additional instructions   cyanocobalamin 1000 MCG tablet Take 1 tablet (1,000 mcg total) by mouth daily. Start taking on: May 13, 2022   cyclobenzaprine 10 MG tablet Commonly known as: FLEXERIL Take 1 tablet (10 mg total) by mouth 2 (two) times daily as needed for  muscle spasms.   fluocinolone 0.01 % cream Commonly known as: VANOS Apply 1 Application topically 2 (two) times daily as needed (Itching).   fluticasone 50 MCG/ACT nasal spray Commonly known as: FLONASE Place 1 spray into both nostrils daily as needed for allergies or rhinitis.   loratadine 10 MG tablet Commonly known as: CLARITIN Take 10 mg by mouth daily as needed for allergies.   nystatin cream Commonly known as: MYCOSTATIN Apply 1 Application topically 2 (two) times daily as needed for dry skin (Itching).         Discharge Instructions/ Education: Discharge instructions given to patient/family with verbalized understanding. Discharge education completed with patient/family including: follow up instructions, medication list, discharge activities, and limitations if indicated.  Patient instructed to return to Emergency Department, call 911, or call MD for any changes in condition.  Patient escorted via wheelchair to lobby and discharged home via private automobile.

## 2022-05-12 NOTE — Procedures (Addendum)
Patient Name: Francis Lin  MRN: [id number]  Epilepsy Attending: Lora Havens  Referring Physician/Provider: Donnetta Simpers, MD  Duration: 05/11/2022 1025 to 05/12/2022 4142   Patient history:  46 y.o. male with PMH significant for gout, IBS, history of DVTs, snoring, GERD who presents to the ED with episodes of dizziness along with hard times finding words/cannot put the words together.  EEG to evaluate for seizure.   Level of alertness: Awake, asleep   AEDs during EEG study: None   Technical aspects: This EEG study was done with scalp electrodes positioned according to the 10-20 International system of electrode placement. Electrical activity was reviewed with band pass filter of 1-'70Hz'$ , sensitivity of 7 uV/mm, display speed of 88m/sec with a '60Hz'$  notched filter applied as appropriate. EEG data were recorded continuously and digitally stored.  Video monitoring was available and reviewed as appropriate.   Description: The posterior dominant rhythm consists of 9 Hz activity of moderate voltage (25-35 uV) seen predominantly in posterior head regions, symmetric and reactive to eye opening and eye closing. Sleep was characterized by vertex waves, sleep spindles (12 to 14 Hz), maximal frontocentral region. Hyperventilation and photic stimulation were not performed.       IMPRESSION: This study is within normal limits. No seizures or epileptiform discharges were seen throughout the recording.   [name] OBarbra Sarks

## 2022-05-12 NOTE — Progress Notes (Signed)
Neurology Progress Note   S:// Patient sitting up in bed in NAD. No family at the bedside. He has no complaints, no episodes overnight. Neurological exam is unchanged. VSS.  LTM has been discontinued. EEG results normal, no seizures identified. 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 Discussed follow up with outpatient neurology   O:// Current vital signs: BP 126/79 (BP Location: Right Arm)   Pulse 67   Temp 98.2 F (36.8 C) (Oral)   Resp 20   Ht '6\' 2"'$  (1.88 m)   Wt (!) 149.7 kg   SpO2 97%   BMI 42.37 kg/m  Vital signs in last 24 hours: Temp:  [97.9 F (36.6 C)-98.4 F (36.9 C)] 98.2 F (36.8 C) (08/03 0826) Pulse Rate:  [64-67] 67 (08/03 0826) Resp:  [16-20] 20 (08/03 0826) BP: (119-130)/(78-82) 126/79 (08/03 0826) SpO2:  [97 %-100 %] 97 % (08/03 0826)  GENERAL: Awake, alert in NAD HEENT: - Normocephalic and atraumatic, dry mm LUNGS - Clear to auscultation bilaterally with no wheezes CV - S1S2 RRR, no m/r/g, equal pulses bilaterally. ABDOMEN - Soft, nontender, nondistended with normoactive BS Ext: warm, well perfused, intact peripheral pulses, no edema  NEURO:  Mental Status: AA&Ox4 Language: speech is clear.  Naming, repetition, fluency, and comprehension intact. Cranial Nerves: PERRL, EOMI, visual fields full, no facial asymmetry, facial sensation intact, hearing intact, tongue/uvula/soft palate midline, normal sternocleidomastoid and trapezius muscle strength. No evidence of tongue atrophy or fibrillations Motor: 5/5 in all 4 extremities Tone: is normal and bulk is normal Sensation- Intact to light touch bilaterally Coordination: FTN intact bilaterally, no ataxia in BLE. Gait- deferred    Medications  Current Facility-Administered Medications:    acetaminophen (TYLENOL) tablet 650 mg, 650 mg, Oral, Q6H PRN, Cherene Altes, MD   [START ON 05/13/2022] cyanocobalamin (VITAMIN B12) tablet 1,000  mcg, 1,000 mcg, Oral, Daily, Cherene Altes, MD   enoxaparin (LOVENOX) injection 40 mg, 40 mg, Subcutaneous, Q24H, Cherene Altes, MD, 40 mg at 05/11/22 6301   febuxostat (ULORIC) tablet 40 mg, 40 mg, Oral, Daily, Cherene Altes, MD   ondansetron (ZOFRAN) tablet 4 mg, 4 mg, Oral, Q8H PRN, Cherene Altes, MD   polyethylene glycol (MIRALAX / GLYCOLAX) packet 17 g, 17 g, Oral, Daily PRN, Cherene Altes, MD   tiZANidine (ZANAFLEX) tablet 4 mg, 4 mg, Oral, TID PRN, Cherene Altes, MD   traMADol Veatrice Bourbon) tablet 50 mg, 50 mg, Oral, Q8H PRN, Cherene Altes, MD Labs CBC    Component Value Date/Time   WBC 6.2 05/11/2022 0645   RBC 5.13 05/11/2022 0645   HGB 15.4 05/11/2022 0645   HCT 45.3 05/11/2022 0645   PLT 234 05/11/2022 0645   MCV 88.3 05/11/2022 0645   MCV 95.5 05/05/2013 1614   MCH 30.0 05/11/2022 0645   MCHC 34.0 05/11/2022 0645   RDW 13.6 05/11/2022 0645   LYMPHSABS 2.0 05/09/2022 1730   MONOABS 0.6 05/09/2022 1730   EOSABS 0.2 05/09/2022 1730   BASOSABS 0.1 05/09/2022 1730    CMP     Component Value Date/Time   NA 138 05/11/2022 0645   NA 142 02/10/2022 0000   K 3.8 05/11/2022 0645   CL 106 05/11/2022 0645   CO2 24 05/11/2022 0645   GLUCOSE 82 05/11/2022 0645   BUN 12 05/11/2022 0645   BUN 13 02/10/2022 0000   CREATININE 1.36 (H) 05/11/2022 0645   CREATININE 1.49 (H) 11/02/2021  1117   CALCIUM 8.7 (L) 05/11/2022 0645   PROT 7.0 05/09/2022 1730   PROT 6.6 04/13/2018 0000   ALBUMIN 4.1 05/09/2022 1730   ALBUMIN 3.8 04/13/2018 0000   AST 19 05/09/2022 1730   ALT 19 05/09/2022 1730   ALKPHOS 62 05/09/2022 1730   BILITOT 0.7 05/09/2022 1730   BILITOT 0.4 04/13/2018 0000   GFRNONAA >60 05/11/2022 0645   GFRAA 58 (L) 03/22/2020 1719    glycosylated hemoglobin  Lipid Panel     Component Value Date/Time   CHOL 128 02/17/2020 1136   CHOL 132 04/13/2018 0000   TRIG 76.0 02/17/2020 1136   HDL 33.30 (L) 02/17/2020 1136   HDL 35 (L)  04/13/2018 0000   CHOLHDL 4 02/17/2020 1136   VLDL 15.2 02/17/2020 1136   LDLCALC 80 02/17/2020 1136   LDLCALC 85 04/13/2018 0000     Imaging I have reviewed images in epic and the results pertinent to this consultation are:  CT-scan of the brain- no acute abnormality  MRI examination of the brain- no acute process   EEG 8/1 no seizures EEG 8/1-8/2: This study is within normal limits. No seizures or epileptiform discharges were seen throughout the recording.  one event was recorded as described above without concomitant EEG change.  This was not an epileptic event.   EEG 8/2-8/3 This study is within normal limits. No seizures or epileptiform discharges were seen throughout the recording.  LABS: Vit B12 223 Folate 11.2 TSH 2.314  Assessment:  46 y.o. male with PMH significant for gout, IBS, history of DVTs, snoring, GERD who presents to the ED with episodes of dizziness along with hard times finding words/cannot put the words together.  reporting episodes 2-3 times a week during which he feels dizzy and has word finding difficulty.  He stated these episodes typically last 5-10 minutes, and have been occurring for 2 years, but are becoming more frequently of late.  Some of the episodes are associated with a generalized headache but not all.  He is admitted for spell characterization. He has not had any typical spells since admission. He had one brief spell of dizziness returning from the bathroom this AM with no concommitant change on EEG.   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  - Able to be discharged home if no other medical needs -Neurology will sign off. Please call with questions or concerns - will need outpatient follow up with neurology  - Driving restrictions x6 mos after last spell with altered consciousness SEIZURE PRECAUTIONS Per [facility] statutes, patients with seizures are not allowed to drive until they  have been seizure-free for six months.   Use caution when using heavy equipment or power tools. Avoid working on ladders or at heights. Take showers instead of baths. Ensure the water temperature is not too high on the home water heater. Do not go swimming alone. Do not lock yourself in a room alone (i.e. bathroom). When caring for infants or small children, sit down when holding, feeding, or changing them to minimize risk of injury to the child in the event you have a seizure. Maintain good sleep hygiene. Avoid alcohol.    If patient has another seizure, call 911 and bring them back to the ED if: A.  The seizure lasts longer than 5 minutes.      B.  The patient doesn't wake shortly after the seizure or has new problems such as difficulty seeing, speaking or  moving following the seizure C.  The patient was injured during the seizure D.  The patient has a temperature over 102 F (39C) E.  The patient vomited during the seizure and now is having trouble breathing   Beulah Gandy DNP, ACNPC-AG  Note written by Beulah Gandy NP and edited by MD as needed

## 2022-05-12 NOTE — Discharge Summary (Signed)
DISCHARGE SUMMARY  Francis Lin  MR#: [id number]  DOB:May 29, 1976  Date of Admission: 05/09/2022 Date of Discharge: 05/12/2022  Attending Physician:Linwood Gullikson Hennie Duos, MD  Patient's QIW:[id number], Alinda Sierras, MD  Consults: Neurology  Disposition: Discharge home  Follow-up Appts:  Follow-up Information     [place]te, Alinda Sierras, MD .   Specialty: Family Medicine Contact information: [place] [address] [phone]         [facility] [place]. Schedule an appointment as soon as possible for a visit .   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  He stated these episodes typically last 5-10 minutes, and have been occurring for 2 years, but are becoming more frequent of late.  Some of the episodes are associated with a generalized headache but not all.  He denies any recent head injury, loss of consciousness, meningitis/CNS infection, CNS surgery, alcohol abuse, or abuse of illicit substances.  He did report to having significant ongoing difficulty with insomnia.  MRI of the brain in the ER was without acute findings.  Hospital Course:  Recurrent transient aphasia - rule out seizure Primary elements on the differential include seizure versus complex migraine - Neurology  monitored on continuous EEG w/ no events occurring during monitored time - no evidence of seziure activity captured - driving restrictions x6 mos after last spell with altered consciousness   History of recurrent DVT Lovenox for DVT prophylaxis -is not on chronic anticoagulation -appears his prior history was related to a crush injury   Gout No evidence of acute flare at present -continue as needed colchicine and scheduled Uloric   Mildly elevated blood pressure Likely a consequence of the stress of admission -blood pressure has normalized without treatment   CKD Stage IIIa improved to Stage II Review of records suggests baseline GFR at 55-59 for at least the past 6 months suggesting an element of CKD -creatinine has improved during this hospital stay with GFR greater than 60 at time of discharge meeting criteria for stage II CKD   B12 deficiency B12 level found to be low at 223 -initiated subcutaneous supplementation during admission -transitioned to oral supplementation at time of discharge - will need outpatient follow-up in 6-8 weeks to assure that he is absorbing it adequately via the oral route   Irritable bowel syndrome Stable during this hospital stay  Allergies as of 05/12/2022       Reactions   Peanuts [peanut Oil] Anaphylaxis   Lactose Diarrhea, Other (See Comments)   Lactose intolerance Lactose intolerance Lactose intolerance   Allopurinol    rash   Caffeine Other (See Comments)   "makes feel bad", headaches   Gabapentin Other (See Comments)   Messes with memory   Ibuprofen Other (See Comments)   Early onset of kidney issues (Stage 3A)   Omeprazole Other (See  Comments)   Mood swing   Penicillins Other (See Comments)   Headaches   Seasonal Ic [cholestatin]    Sneezing,runny nose , itchy eyes        Medication List     STOP taking these medications    doxycycline 100 MG capsule Commonly known as: VIBRAMYCIN   febuxostat 40 MG tablet Commonly known as:  ULORIC       TAKE these medications    acetaminophen 325 MG tablet Commonly known as: TYLENOL Take 650 mg by mouth every 6 (six) hours as needed for mild pain.   colchicine 0.6 MG tablet Take 0.5 tablets (0.3 mg total) by mouth as needed. If tolerated, increase to 0.'6mg'$  daily after one week What changed:  when to take this reasons to take this additional instructions   cyanocobalamin 1000 MCG tablet Take 1 tablet (1,000 mcg total) by mouth daily. Start taking on: May 13, 2022   cyclobenzaprine 10 MG tablet Commonly known as: FLEXERIL Take 1 tablet (10 mg total) by mouth 2 (two) times daily as needed for muscle spasms.   fluocinolone 0.01 % cream Commonly known as: VANOS Apply 1 Application topically 2 (two) times daily as needed (Itching).   fluticasone 50 MCG/ACT nasal spray Commonly known as: FLONASE Place 1 spray into both nostrils daily as needed for allergies or rhinitis.   loratadine 10 MG tablet Commonly known as: CLARITIN Take 10 mg by mouth daily as needed for allergies.   nystatin cream Commonly known as: MYCOSTATIN Apply 1 Application topically 2 (two) times daily as needed for dry skin (Itching).        Day of Discharge BP 124/83 (BP Location: Right Arm)   Pulse 70   Temp 97.9 F (36.6 C) (Oral)   Resp 20   Ht '6\' 2"'$  (1.88 m)   Wt (!) 149.7 kg   SpO2 100%   BMI 42.37 kg/m   Physical Exam: General: No acute respiratory distress Lungs: Clear to auscultation bilaterally without wheezes or crackles Cardiovascular: Regular rate and rhythm without murmur gallop or rub normal S1 and S2 Abdomen: Nontender, nondistended, soft, bowel sounds positive, no rebound, no ascites, no appreciable mass Extremities: No significant cyanosis, clubbing, or edema bilateral lower extremities  Basic Metabolic Panel: Recent Labs  Lab 05/09/22 1730 05/11/22 0645  NA 139 138  K 4.3 3.8  CL 105 106  CO2 26 24  GLUCOSE 94 82  BUN 15 12  CREATININE 1.58* 1.36*   CALCIUM 9.2 8.7*   Liver Function Tests: Recent Labs  Lab 05/09/22 1730  AST 19  ALT 19  ALKPHOS 62  BILITOT 0.7  PROT 7.0  ALBUMIN 4.1   CBC: Recent Labs  Lab 05/09/22 1730 05/11/22 0645  WBC 6.3 6.2  NEUTROABS 3.4  --   HGB 14.9 15.4  HCT 44.1 45.3  MCV 88.4 88.3  PLT 247 234    Time spent in discharge (includes decision making & examination of pt): 35 minutes  05/12/2022, 1:19 PM   Cherene Altes, MD [facility] Office  [phone]

## 2022-05-12 NOTE — TOC Transition Note (Signed)
Transition of Care [facility]) - CM/SW Discharge Note   Patient Details  Name: Francis Lin MRN: [id number] Date of Birth: August 10, 1976  Transition of Care [facility]) CM/SW Contact:  Pollie Friar, RN Phone Number: 05/12/2022, 3:48 PM   Clinical Narrative:    Pt discharging home with self care. No needs per TOC.   Final next level of care: Home/Self Care Barriers to Discharge: No Barriers Identified   Patient Goals and CMS Choice        Discharge Placement                       Discharge Plan and Services                                     Social Determinants of Health (SDOH) Interventions     Readmission Risk Interventions     No data to display

## 2022-05-12 NOTE — Progress Notes (Signed)
   05/12/22  To Whom it may concern,  Francis Lin was hospitalized at [facility] from 05/09/2022 through 05/12/2022.  He has been cleared to return to work on but not before 05/16/2022.  He has been advised to avoid operating heavy machinery until further evaluated by his doctor in the outpatient clinic.   Should you have further questions please feel free to contact our office.  Be advised that no medical information will be divulged without a signed HIPA release from the patient.    Sincerely,  Cherene Altes, MD [facility] Office  [phone]

## 2022-05-12 NOTE — Progress Notes (Signed)
LTM EEG discontinued - no skin breakdown at unhook.   

## 2022-05-13 ENCOUNTER — Encounter: Payer: Self-pay | Admitting: Family Medicine

## 2022-05-17 ENCOUNTER — Ambulatory Visit (INDEPENDENT_AMBULATORY_CARE_PROVIDER_SITE_OTHER): Payer: Managed Care, Other (non HMO) | Admitting: Family Medicine

## 2022-05-17 ENCOUNTER — Encounter: Payer: Self-pay | Admitting: Family Medicine

## 2022-05-17 VITALS — BP 136/76 | HR 70 | Temp 97.7°F | Wt 325.9 lb

## 2022-05-17 DIAGNOSIS — M5416 Radiculopathy, lumbar region: Secondary | ICD-10-CM

## 2022-05-17 DIAGNOSIS — R4701 Aphasia: Secondary | ICD-10-CM

## 2022-05-17 DIAGNOSIS — E538 Deficiency of other specified B group vitamins: Secondary | ICD-10-CM | POA: Diagnosis not present

## 2022-05-17 DIAGNOSIS — Z833 Family history of diabetes mellitus: Secondary | ICD-10-CM

## 2022-05-17 LAB — POCT GLYCOSYLATED HEMOGLOBIN (HGB A1C): Hemoglobin A1C: 5.3 % (ref 4.0–5.6)

## 2022-05-17 NOTE — Progress Notes (Signed)
Established Patient Office Visit  Subjective   Patient ID: Francis Lin, male    DOB: 14-Jan-1976  Age: 46 y.o. MRN: [id number]  Chief Complaint  Patient presents with   Hospitalization Follow-up    HPI   Patient here for hospital follow-up.  He had been having some recent episodes of intermittent dizziness, difficulty with word finding and putting words together and occasional dyspnea.  He had been scheduled to be seen here and ended up going to the ED and was admitted to the hospital 31 July through 3 August.  MRI brain showed no acute findings.  Was admitted to rule out seizure.  Differential was seizure versus complex migraine.  Neurology consulted.  EEG showed no evidence for seizure activity.  Has not had any loss of consciousness with episodes.  Had elevated blood pressures on admission but these normalized with observation.  He did have slightly decreased GFR and this improved some with hydration.  He had low B12 223.  Has just started taking oral replacement/supplement.  On a separate item he has states has had over a year of low back pain with radiation down right lower extremity.  He tried physical therapy without improvement.  Occasional tingling and numbness.  Occasional sensation of weakness.  No urine or stool incontinence.  Avoids nonsteroidals.  No relief with Tylenol.  Taken muscle relaxers with minimal improvement.  Symptoms becoming more frequent.  In looking over his labs he did have alcohol of 10 and uses no alcohol whatsoever including cough medications, etc.  He has noted that when he eats high carb meals he tends to be lethargic and somewhat dizzy afterwards.  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  He is aware that this is rare.  Past Medical History:  Diagnosis Date   Acid reflux    Allergy    Anxiety    stress    Clotting disorder (HCC)    right leg x 2 in calf and behind knee    DVT (deep venous thrombosis)  (HCC)    Esophagitis    Gout    Hypersomnia, persistent    IBS (irritable bowel syndrome)    PONV (postoperative nausea and vomiting)    nausea with no vomiting    Shifting sleep-work schedule, affecting sleep 06/20/2013   Snoring disorder 06/20/2013   Tubular adenoma of colon    Past Surgical History:  Procedure Laterality Date   COLONOSCOPY     ESOPHAGEAL DILATION     HEMORRHOID BANDING     ORIF TIBIA & FIBULA FRACTURES Right    right leg surgery x 2 places- rod in place knee to ankle    UPPER GASTROINTESTINAL ENDOSCOPY     wisdom teeth     wrist surgery Right     reports that he has never smoked. He has never used smokeless tobacco. He reports that he does not drink alcohol and does not use drugs. family history includes ADD / ADHD in his daughter and daughter; Adrenal disorder in his sister; Arthritis in his father; Asthma in his daughter; Atrial fibrillation in his father; Bipolar disorder in his sister; Cancer in his father; Diabetes in his father and sister; Eczema in his daughter, daughter, and daughter; Edema in his sister; Gout in his father; Heart attack in his father; High Cholesterol in his daughter; Hypertension in his father; Hypotension in his daughter; Lupus in his maternal aunt and mother; Obesity in his sister; Stroke in his  sister. 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  Eyes:   Negative for blurred vision.  Respiratory:  Negative for shortness of breath.   Cardiovascular:  Negative for chest pain.  Gastrointestinal:  Negative for abdominal pain.  Musculoskeletal:  Positive for back pain.  Neurological:  Positive for dizziness. Negative for tremors, focal weakness, loss of consciousness, weakness and headaches.      Objective:     BP 136/76 (BP Location: Left Arm, Patient Position: Sitting, Cuff Size: Normal)   Pulse 70   Temp 97.7 F (36.5 C) (Oral)   Wt (!) 325 lb 14.4 oz (147.8 kg)   SpO2 98%   BMI 41.84 kg/m  BP Readings from Last 3 Encounters:  05/17/22 136/76  05/12/22 124/83  01/31/22 123/70   Wt Readings from Last 3 Encounters:  05/17/22 (!) 325 lb 14.4 oz (147.8 kg)  05/09/22 (!) 330 lb (149.7 kg)  02/01/22 (!) 336 lb (152.4 kg)      Physical Exam Constitutional:      Appearance: He is well-developed.  HENT:     Right Ear: External ear normal.     Left Ear: External ear normal.  Eyes:     Pupils: Pupils are equal, round, and reactive to light.  Neck:     Thyroid: No thyromegaly.  Cardiovascular:     Rate and Rhythm: Normal rate and regular rhythm.  Pulmonary:     Effort: Pulmonary effort is normal. No respiratory distress.     Breath sounds: Normal breath sounds. No wheezing or rales.  Musculoskeletal:     Cervical back: Neck supple.     Comments: Straight leg raise are negative bilaterally  Symmetric reflexes knee and ankle bilaterally.  Neurological:     Mental Status: He is alert and oriented to person, place, and time.     Cranial Nerves: No cranial nerve deficit.     Gait: Gait normal.     Comments: No focal weakness lower extremities      Results for orders placed or performed in visit on 05/17/22  POCT glycosylated hemoglobin (Hb A1C)  Result Value Ref Range   Hemoglobin A1C 5.3 4.0 - 5.6 %   HbA1c POC (<> result, manual entry)     HbA1c, POC (prediabetic range)     HbA1c, POC (controlled diabetic range)         The ASCVD Risk score (Arnett DK, et al., 2019) failed to calculate for the following reasons:   The valid total cholesterol range is 130 to 320 mg/dL    Assessment & Plan:   #1 recent episodes of difficulty with word finding/transient aphasia.  No evidence for seizure activity by EEG.  MRI brain unremarkable.  He is in process of getting follow-up with neurology.  #2 B12 deficiency.  Recent B12 level 223.  Start oral replacement 1000 mcg daily.  Recheck B12 level in 2 months  #3 chronic right-sided sciatica symptoms.  Nonfocal neuroexam currently.  Failed conservative therapy and symptoms present for over a year.  Consider MRI to further evaluate  #4 recent mildly elevated alcohol level in the absence of alcohol ingestion.  Patient had questions regarding alcohol brewery syndrome.  Will look into further testing.  This is a rare disorder and apparently some complexity in testing with glucose load and serial blood alcohol levels.   #5 FH of type 2 diabetes.   Pt requesting testing.  A1C 5.3 c/w 5.8 one year ago.   Continue with low glycemic diet.    Over 45 minutes spent reviewing recent admission, labs, x-rays and discussing other issues above.   No follow-ups on file.    Carolann Littler, MD

## 2022-05-17 NOTE — Patient Instructions (Signed)
Will set up MRI lumbar spine

## 2022-05-18 ENCOUNTER — Telehealth: Payer: Self-pay | Admitting: Family Medicine

## 2022-05-18 NOTE — Telephone Encounter (Signed)
Pt dropped off forms to be signed by Dr. Elyn Peers have been placed in Dr. Dierdre Highman. Pt req forms to be faxed to [phone] Attn: Doria  Pt is aware it may take between 5-7 business days.   Please advise.

## 2022-05-20 ENCOUNTER — Other Ambulatory Visit: Payer: Self-pay | Admitting: Rheumatology

## 2022-05-20 DIAGNOSIS — M1A00X0 Idiopathic chronic gout, unspecified site, without tophus (tophi): Secondary | ICD-10-CM

## 2022-05-23 ENCOUNTER — Telehealth: Payer: Self-pay | Admitting: Family Medicine

## 2022-05-23 IMAGING — CT CT ABD-PELV W/ CM
2 of 5 series · 16 of 46 positions shown, 18 images · IV contrast (OMNIPAQUE 300)
Comparison: 05/05/2013

CLINICAL DATA: Sub AV left upper quadrant pain, frequent diarrhea,
nausea

EXAM:
CT ABDOMEN AND PELVIS WITH CONTRAST
TECHNIQUE: Multidetector CT imaging of the abdomen and pelvis was performed
using the standard protocol following bolus administration of
intravenous contrast.
CONTRAST:  125mL OMNIPAQUE IOHEXOL 300 MG/ML SOLN, additional oral
enteric contrast

[Series 2: abd/pel w · axial · 0.73mm/px · z∈[+885,+1420]mm · 13 of 119 slices shown, 15 images]
[im 6/119  soft-tissue]
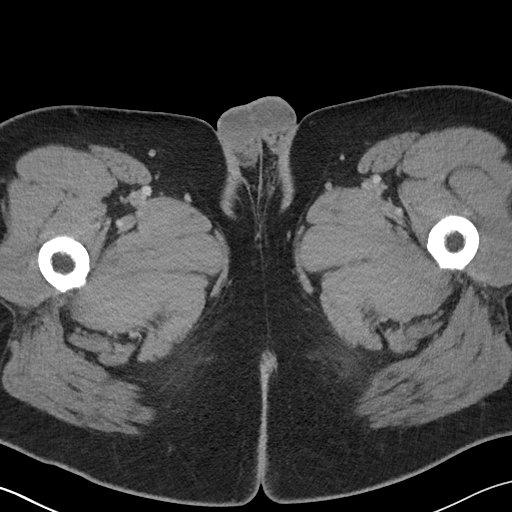
[im 6/119  bone]
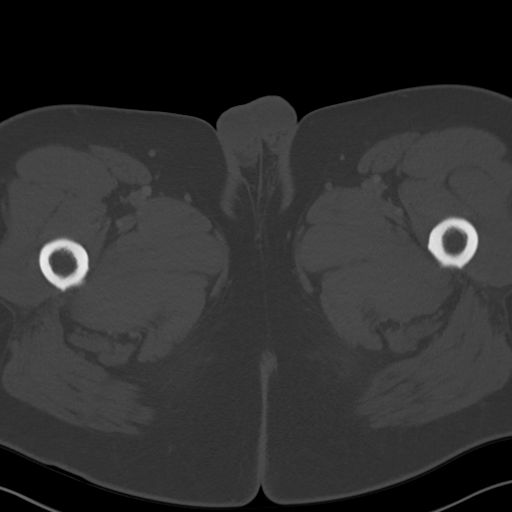
[im 18/119  soft-tissue]
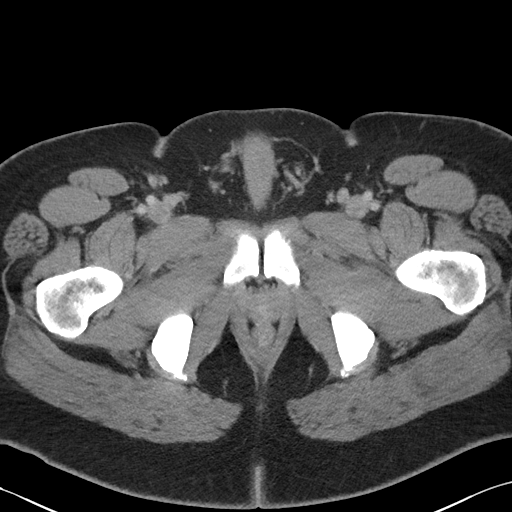
[im 24/119  soft-tissue]
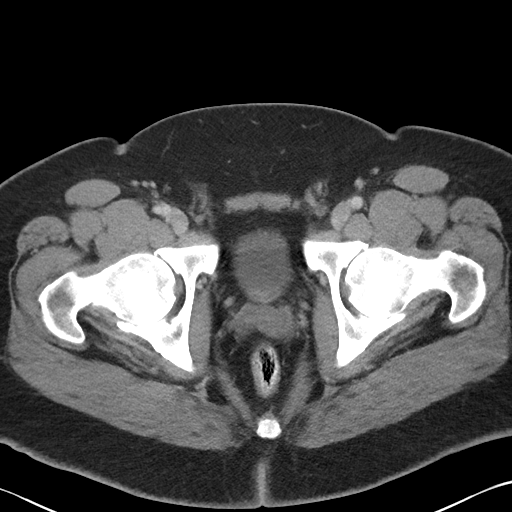
[im 36/119  soft-tissue]
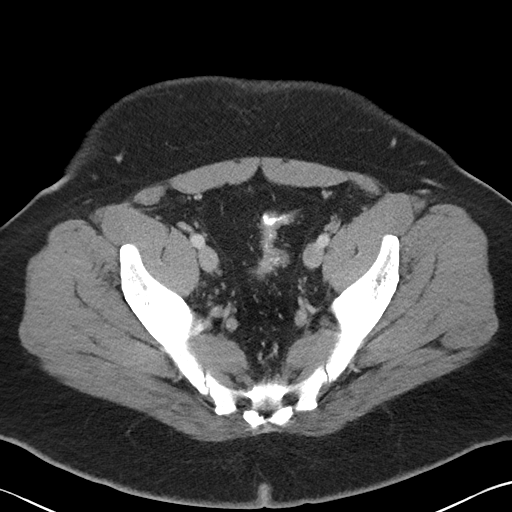
[im 42/119  soft-tissue]
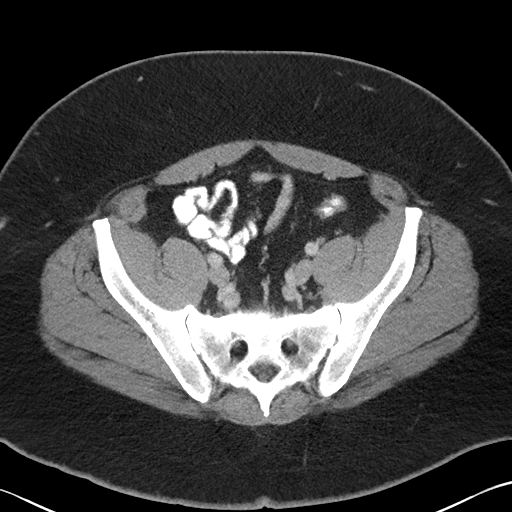
[im 54/119  soft-tissue]
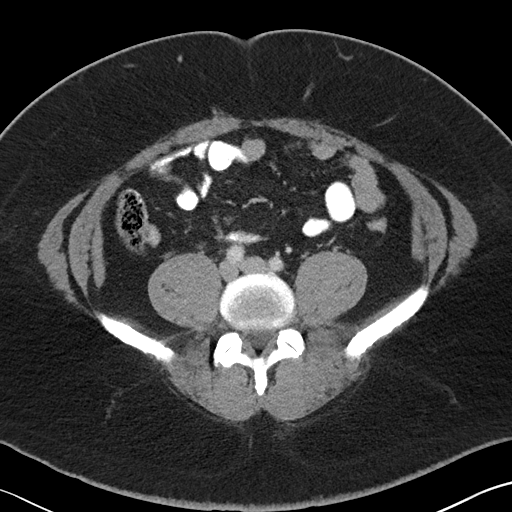
[im 60/119  soft-tissue]
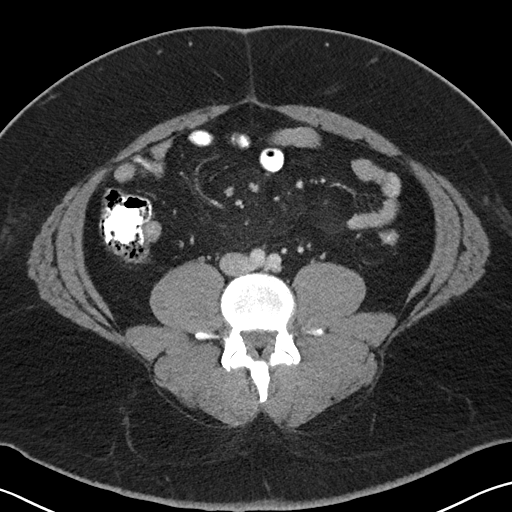
[im 65/119  soft-tissue]
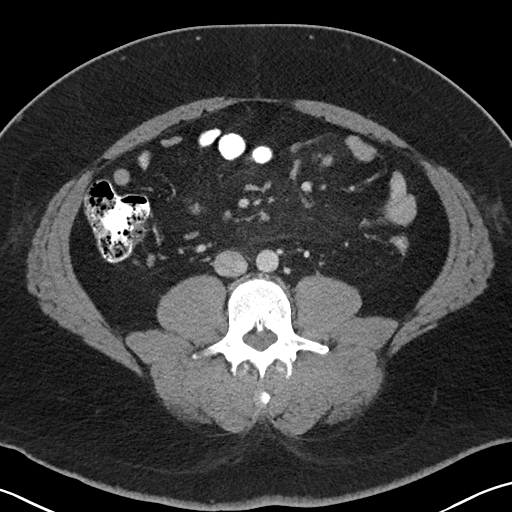
[im 77/119  soft-tissue]
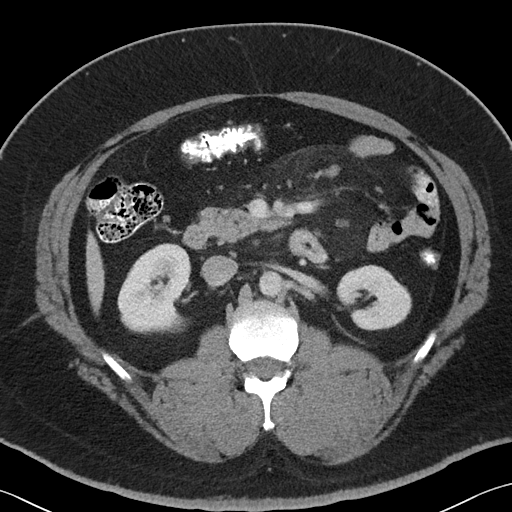
[im 77/119  bone]
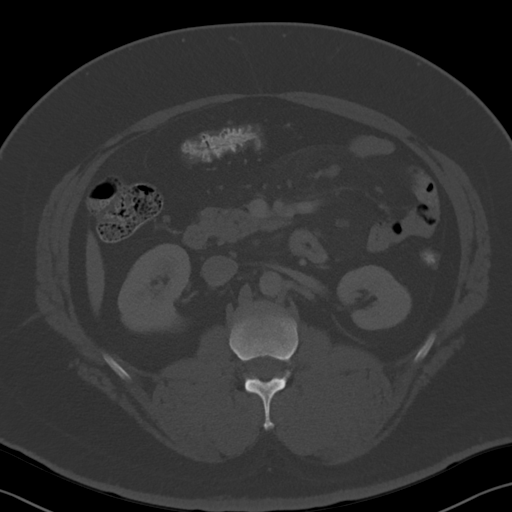
[im 83/119  soft-tissue]
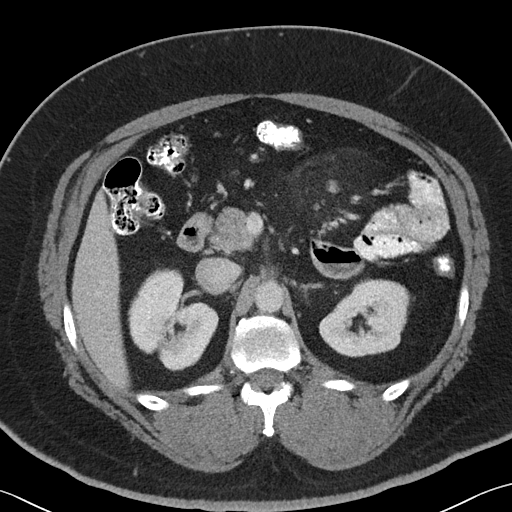
[im 95/119  soft-tissue]
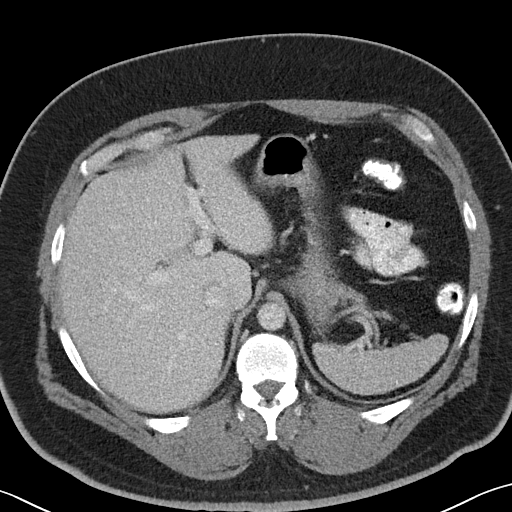
[im 101/119  soft-tissue]
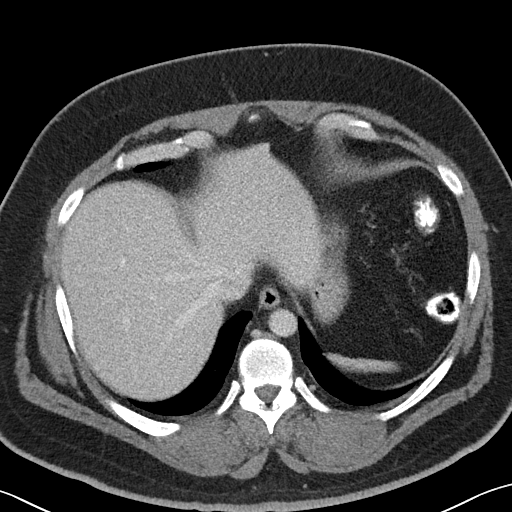
[im 113/119  soft-tissue]
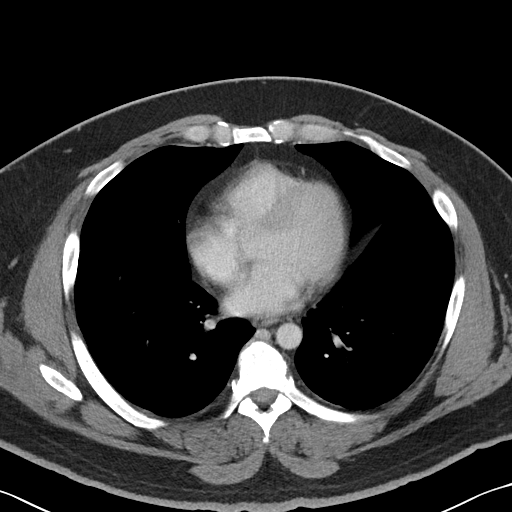

[Series 5: coronal st · coronal · 0.93mm/px · 3 of 108 slices shown]
[im 36/108  soft-tissue]
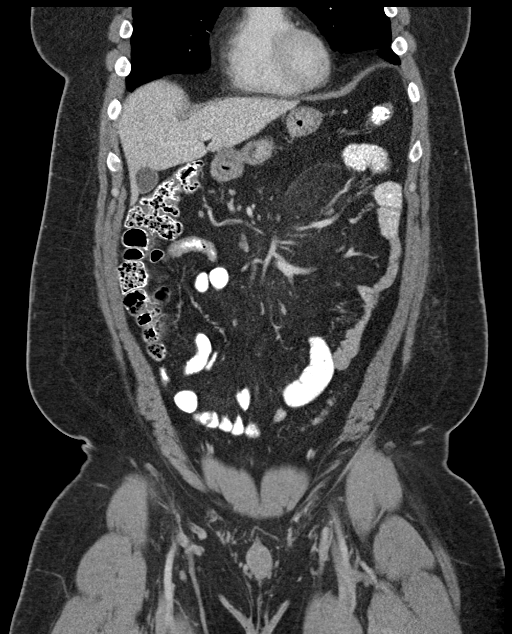
[im 48/108  soft-tissue]
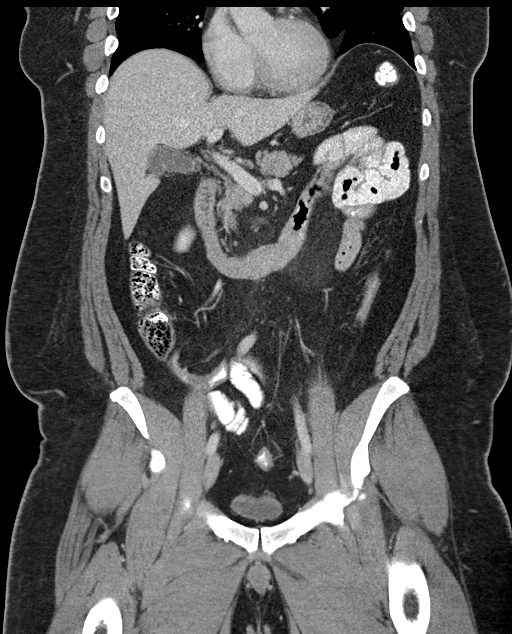
[im 60/108  soft-tissue]
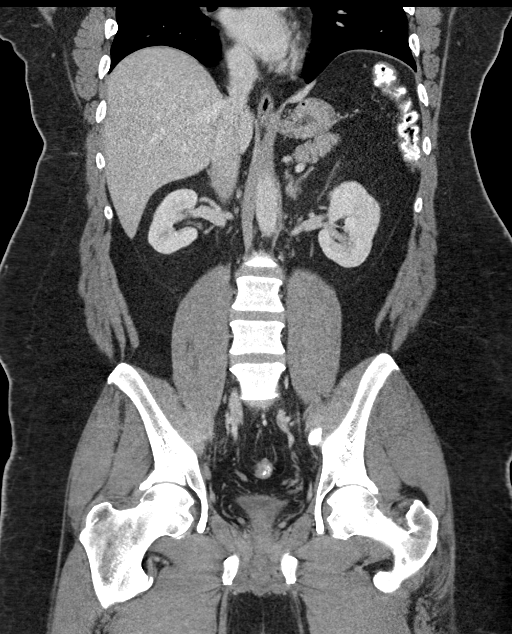

[16 of 46 positions shown; findings below may reference images not displayed]

FINDINGS: Lower chest: No acute abnormality.

Hepatobiliary: No solid liver abnormality is seen. No gallstones,
gallbladder wall thickening, or biliary dilatation.

Pancreas: Unremarkable. No pancreatic ductal dilatation or
surrounding inflammatory changes.

Spleen: Normal in size without significant abnormality.

Adrenals/Urinary Tract: Adrenal glands are unremarkable. Kidneys are
normal, without renal calculi, solid lesion, or hydronephrosis.
Bladder is unremarkable.

Stomach/Bowel: Stomach is within normal limits. Appendix appears
normal. There is a thickened, narrowed appearance of the terminal
ileum, involving approximately the distal 10 cm (series 5, image
46). The descending colon, sigmoid colon, and rectum, are
decompressed, however appear mildly thickened.

Vascular/Lymphatic: No significant vascular findings are present.
Prominent subcentimeter lymph nodes in the central small bowel
mesentery with mild associated fat stranding, unchanged in
appearance compared to prior examination dated 0008 and consistent
with benign sequelae of prior infection or inflammation.

Reproductive: No mass or other significant abnormality.

Other: No abdominal wall hernia or abnormality. No abdominopelvic
ascites.

Musculoskeletal: No acute or significant osseous findings.
IMPRESSION: There is a thickened, narrowed appearance of the terminal ileum,
involving approximately the distal 10 cm. The descending colon,
sigmoid colon, and rectum, are decompressed, however appear mildly
thickened. Findings suggest inflammatory bowel disease, particularly
Crohn's disease given appearance and distribution. There is no overt
evidence of obstruction or other complication on this non tailored
examination.

## 2022-05-23 NOTE — Telephone Encounter (Signed)
Pt called to ask if he can come pick up the original completed short term disability papers?  Please advise. [phone]

## 2022-05-23 NOTE — Telephone Encounter (Signed)
Placed in front office for pickup and patient aware

## 2022-05-26 ENCOUNTER — Telehealth: Payer: Self-pay | Admitting: Family Medicine

## 2022-05-26 NOTE — Telephone Encounter (Signed)
Patient have an upcoming appointment on 08/21 at 3pm.  Please let me know if you decide to do the peer to peer. I would need authorization number and dates.  Thanks! Paula Libra

## 2022-05-26 NOTE — Telephone Encounter (Addendum)
Pt called and received a letter from [employer] concerning MRI lumbar  spine was denied and they need additional info and peer to peer he gave me the number [phone] and I called and spoke with cindy owens customer service and she will have a copy fax to office could take up to 24 hrs to received info.The case number is [id number]

## 2022-05-30 ENCOUNTER — Other Ambulatory Visit: Payer: Managed Care, Other (non HMO)

## 2022-06-06 ENCOUNTER — Other Ambulatory Visit: Payer: Managed Care, Other (non HMO)

## 2022-06-06 ENCOUNTER — Telehealth: Payer: Self-pay | Admitting: Family Medicine

## 2022-06-06 NOTE — Telephone Encounter (Signed)
Pt dropped off [place] Request Form. Need form to be signed by Dr. Haynes Hoehn was placed in red file folder.  Please advise.

## 2022-06-07 NOTE — Telephone Encounter (Signed)
Left detailed message informing pt that forms were completed and faxed

## 2022-06-20 ENCOUNTER — Encounter: Payer: Self-pay | Admitting: Neurology

## 2022-06-20 ENCOUNTER — Ambulatory Visit: Payer: Managed Care, Other (non HMO) | Admitting: Neurology

## 2022-06-20 VITALS — BP 125/86 | HR 72 | Ht 74.0 in | Wt 321.0 lb

## 2022-06-20 DIAGNOSIS — G43009 Migraine without aura, not intractable, without status migrainosus: Secondary | ICD-10-CM | POA: Diagnosis not present

## 2022-06-20 DIAGNOSIS — G44019 Episodic cluster headache, not intractable: Secondary | ICD-10-CM

## 2022-06-20 DIAGNOSIS — R0981 Nasal congestion: Secondary | ICD-10-CM

## 2022-06-20 DIAGNOSIS — G4726 Circadian rhythm sleep disorder, shift work type: Secondary | ICD-10-CM | POA: Diagnosis not present

## 2022-06-20 DIAGNOSIS — G4733 Obstructive sleep apnea (adult) (pediatric): Secondary | ICD-10-CM

## 2022-06-20 MED ORDER — TOPIRAMATE 25 MG PO TABS: 25.0000 mg | ORAL_TABLET | Freq: Two times a day (BID) | ORAL | 3 refills | Status: DC

## 2022-06-20 NOTE — Patient Instructions (Signed)
Migraine Headache A migraine headache is an intense, throbbing pain on one side or both sides of the head. Migraine headaches may also cause other symptoms, such as nausea, vomiting, and sensitivity to light and noise. A migraine headache can last from 4 hours to 3 days. Talk with your doctor about what things may bring on (trigger) your migraine headaches. What are the causes? The exact cause of this condition is not known. However, a migraine may be caused when nerves in the brain become irritated and release chemicals that cause inflammation of blood vessels. This inflammation causes pain. This condition may be triggered or caused by: Drinking alcohol. Smoking. Taking medicines, such as: Medicine used to treat chest pain (nitroglycerin). Birth control pills. Estrogen. Certain blood pressure medicines. Eating or drinking products that contain nitrates, glutamate, aspartame, or tyramine. Aged cheeses, chocolate, or caffeine may also be triggers. Doing physical activity. Other things that may trigger a migraine headache include: Menstruation. Pregnancy. Hunger. Stress. Lack of sleep or too much sleep. Weather changes. Fatigue. What increases the risk? The following factors may make you more likely to experience migraine headaches: Being a certain age. This condition is more common in people who are 25-55 years old. Being male. Having a family history of migraine headaches. Being Caucasian. Having a mental health condition, such as depression or anxiety. Being obese. What are the signs or symptoms? The main symptom of this condition is pulsating or throbbing pain. This pain may: Happen in any area of the head, such as on one side or both sides. Interfere with daily activities. Get worse with physical activity. Get worse with exposure to bright lights or loud noises. Other symptoms may include: Nausea. Vomiting. Dizziness. General sensitivity to bright lights, loud noises, or  smells. Before you get a migraine headache, you may get warning signs (an aura). An aura may include: Seeing flashing lights or having blind spots. Seeing bright spots, halos, or zigzag lines. Having tunnel vision or blurred vision. Having numbness or a tingling feeling. Having trouble talking. Having muscle weakness. Some people have symptoms after a migraine headache (postdromal phase), such as: Feeling tired. Difficulty concentrating. How is this diagnosed? A migraine headache can be diagnosed based on: Your symptoms. A physical exam. Tests, such as: CT scan or an MRI of the head. These imaging tests can help rule out other causes of headaches. Taking fluid from the spine (lumbar puncture) and analyzing it (cerebrospinal fluid analysis, or CSF analysis). How is this treated? This condition may be treated with medicines that: Relieve pain. Relieve nausea. Prevent migraine headaches. Treatment for this condition may also include: Acupuncture. Lifestyle changes like avoiding foods that trigger migraine headaches. Biofeedback. Cognitive behavioral therapy. Follow these instructions at home: Medicines Take over-the-counter and prescription medicines only as told by your health care provider. Ask your health care provider if the medicine prescribed to you: Requires you to avoid driving or using heavy machinery. Can cause constipation. You may need to take these actions to prevent or treat constipation: Drink enough fluid to keep your urine pale yellow. Take over-the-counter or prescription medicines. Eat foods that are high in fiber, such as beans, whole grains, and fresh fruits and vegetables. Limit foods that are high in fat and processed sugars, such as fried or sweet foods. Lifestyle Do not drink alcohol. Do not use any products that contain nicotine or tobacco, such as cigarettes, e-cigarettes, and chewing tobacco. If you need help quitting, ask your health care  provider. Get at least 8   hours of sleep every night. Find ways to manage stress, such as meditation, deep breathing, or yoga. General instructions     Keep a journal to find out what may trigger your migraine headaches. For example, write down: What you eat and drink. How much sleep you get. Any change to your diet or medicines. If you have a migraine headache: Avoid things that make your symptoms worse, such as bright lights. It may help to lie down in a dark, quiet room. Do not drive or use heavy machinery. Ask your health care provider what activities are safe for you while you are experiencing symptoms. Keep all follow-up visits as told by your health care provider. This is important. Contact a health care provider if: You develop symptoms that are different or more severe than your usual migraine headache symptoms. You have more than 15 headache days in one month. Get help right away if: Your migraine headache becomes severe. Your migraine headache lasts longer than 72 hours. You have a fever. You have a stiff neck. You have vision loss. Your muscles feel weak or like you cannot control them. You start to lose your balance often. You have trouble walking. You faint. You have a seizure. Summary A migraine headache is an intense, throbbing pain on one side or both sides of the head. Migraines may also cause other symptoms, such as nausea, vomiting, and sensitivity to light and noise. This condition may be treated with medicines and lifestyle changes. You may also need to avoid certain things that trigger a migraine headache. Keep a journal to find out what may trigger your migraine headaches. Contact your health care provider if you have more than 15 headache days in a month or you develop symptoms that are different or more severe than your usual migraine headache symptoms. This information is not intended to replace advice given to you by your health care provider. Make sure  you discuss any questions you have with your health care provider. Document Revised: 01/18/2019 Document Reviewed: 11/08/2018 Else[employer] Patient Education  [place]. Topiramate Tablets What is this medication? TOPIRAMATE (toe PYRE a mate) prevents and controls seizures in people with epilepsy. It may also be used to prevent migraine headaches. It works by calming overactive nerves in your body. This medicine may be used for other purposes; ask your health care provider or pharmacist if you have questions. COMMON BRAND NAME(S): Topamax, Topiragen What should I tell my care team before I take this medication? They need to know if you have any of these conditions: Bleeding disorder Kidney disease Lung disease Suicidal thoughts, plans, or attempt by you or a family member An unusual or allergic reaction to topiramate, other medications, foods, dyes, or preservatives Pregnant or trying to get pregnant Breast-feeding How should I use this medication? Take this medication by mouth with water. Take it as directed on the prescription label at the same time every day. Do not cut, crush or chew this medicine. Swallow the tablets whole. You can take it with or without food. If it upsets your stomach, take it with food. Keep taking it unless your care team tells you to stop. A special MedGuide will be given to you by the pharmacist with each prescription and refill. Be sure to read this information carefully each time. Talk to your care team about the use of this medication in children. While it may be prescribed for children as young as 2 years for selected conditions, precautions do apply. Overdosage: If  you think you have taken too much of this medicine contact a poison control center or emergency room at once. NOTE: This medicine is only for you. Do not share this medicine with others. What if I miss a dose? If you miss a dose, take it as soon as you can unless it is within 6 hours of the next  dose. If it is within 6 hours of the next dose, skip the missed dose. Take the next dose at the normal time. Do not take double or extra doses. What may interact with this medication? Acetazolamide Alcohol Antihistamines for allergy, cough, and cold Aspirin and aspirin-like medications Atropine Certain medications for anxiety or sleep Certain medications for bladder problems, such as oxybutynin, tolterodine Certain medications for depression, such as amitriptyline, fluoxetine, sertraline Certain medications for Parkinson disease, such as benztropine, trihexyphenidyl Certain medications for seizures, such as carbamazepine, lamotrigine, phenobarbital, phenytoin, primidone, valproic acid, zonisamide Certain medications for stomach problems, such as dicyclomine, hyoscyamine Certain medications for travel sickness, such as scopolamine Certain medications that treat or prevent blood clots, such as warfarin, enoxaparin, dalteparin, apixaban, dabigatran, rivaroxaban Digoxin Diltiazem Estrogen and progestin hormones General anesthetics, such as halothane, isoflurane, methoxyflurane, propofol Glyburide Hydrochlorothiazide Ipratropium Lithium Medications that relax muscles Metformin NSAIDs, medications for pain and inflammation, such as ibuprofen or naproxen Opioid medications for pain Phenothiazines, such as chlorpromazine, mesoridazine, prochlorperazine, thioridazine Pioglitazone This list may not describe all possible interactions. Give your health care provider a list of all the medicines, herbs, non-prescription drugs, or dietary supplements you use. Also tell them if you smoke, drink alcohol, or use illegal drugs. Some items may interact with your medicine. What should I watch for while using this medication? Visit your care team for regular checks on your progress. Tell your care team if your symptoms do not start to get better or if they get worse. Do not suddenly stop taking this  medication. You may develop a severe reaction. Your care team will tell you how much medication to take. If your care team wants you to stop the medication, the dose may be slowly lowered over time to avoid any side effects. Wear a medical ID bracelet or chain. Carry a card that describes your condition. List the medications and doses you take on the card. This medication may affect your coordination, reaction time, or judgment. Do not drive or operate machinery until you know how this medication affects you. Sit up or stand slowly to reduce the risk of dizzy or fainting spells. Drinking alcohol with this medication can increase the risk of these side effects. This medication may cause serious skin reactions. They can happen weeks to months after starting the medication. Contact your care team right away if you notice fevers or flu-like symptoms with a rash. The rash may be red or purple and then turn into blisters or peeling of the skin. You may also notice a red rash with swelling of the face, lips, or lymph nodes in your neck or under your arms. This medication may cause thoughts of suicide or depression. This includes sudden changes in mood, behaviors, or thoughts. These changes can happen at any time but are more common in the beginning of treatment or after a change in dose. Call your care team right away if you experience these thoughts or worsening depression. This medication may slow your child's growth if it is taken for a long time at high doses. Your child's care team will monitor your child's growth. Using this medication  for a long time may weaken your bones. The risk of bone fractures may be increased. Talk to your care team about your bone health. Discuss this medication with your care team if you may be pregnant. Serious birth defects can occur if you take this medication during pregnancy. There are benefits and risks to taking medications during pregnancy. Your care team can help you find  the option that works for you. Contraception is recommended while taking this medication. Estrogen and progestin hormones may not work as well while you are taking this medication. Your care team can help you find the option that works for you. Talk to your care team before breastfeeding. Changes to your treatment plan may be needed. What side effects may I notice from receiving this medication? Side effects that you should report to your care team as soon as possible: Allergic reactions--skin rash, itching, hives, swelling of the face, lips, tongue, or throat High acid level--trouble breathing, unusual weakness or fatigue, confusion, headache, fast or irregular heartbeat, nausea, vomiting High ammonia level--unusual weakness or fatigue, confusion, loss of appetite, nausea, vomiting, seizures Fever that does not go away, decrease in sweat Kidney stones--blood in the urine, pain or trouble passing urine, pain in the lower back or sides Redness, blistering, peeling or loosening of the skin, including inside the mouth Sudden eye pain or change in vision such as blurry vision, seeing halos around lights, vision loss Thoughts of suicide or self-harm, worsening mood, feelings of depression Side effects that usually do not require medical attention (report to your care team if they continue or are bothersome): Burning or tingling sensation in hands or feet Difficulty with paying attention, memory, or speech Dizziness Drowsiness Fatigue Loss of appetite with weight loss Slow or sluggish movements of the body This list may not describe all possible side effects. Call your doctor for medical advice about side effects. You may report side effects to FDA at 1-800-FDA-1088. Where should I keep my medication? Keep out of the reach of children and pets. Store between 15 and 30 degrees C (59 and 86 degrees F). Protect from moisture. Keep the container tightly closed. Get rid of any unused medication after the  expiration date. To get rid of medications that are no longer needed or have expired: Take the medication to a medication take-back program. Check with your pharmacy or law enforcement to find a location. If you cannot return the medication, check the label or package insert to see if the medication should be thrown out in the garbage or flushed down the toilet. If you are not sure, ask your care team. If it is safe to put it in the trash, empty the medication out of the container. Mix the medication with cat litter, dirt, coffee grounds, or other unwanted substance. Seal the mixture in a bag or container. Put it in the trash. NOTE: This sheet is a summary. It may not cover all possible information. If you have questions about this medicine, talk to your doctor, pharmacist, or health care provider.  2023 Elsevier/Gold Standard (2007-11-17 00:00:00)

## 2022-06-20 NOTE — Progress Notes (Signed)
SLEEP MEDICINE CLINIC    Provider:  Larey Seat, MD  Primary Care Physician:  [name], MD [name] [address]     Referring Provider: Derek Jack, Md [id number] [place],  [place] [address]          Chief Complaint according to patient   Patient presents with: RM 10, alone. Transient aphasia. Went to hospital 05/09/22 to r/o stroke. Cleared. Had EEG - negative, MRI and EEG here to review- neuro consult . Here to f/u with neurology. Possibly migraines? Lack of sleep? (Works night shift). Getting 3-4 migraines per week. Pain/severity depends on migraine episode. Has nausea. Sometimes has L arm numbness.  Does snore while sleeping.      New Patient (Initial Visit)           HISTORY OF PRESENT ILLNESS:  Francis Lin is a 46 y.o. year year old 66 or [place] American male patient seen here as a referral on 06/20/2022 from hospital Neuro service for a sleep consultation. I have seen Francis Lin exactly 9 years ago, on 06-20-2022 for sleepiness.  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   He reports headaches behind the eyes, photosensitivity, nausea, dizziness- and speech was slurred in these incidents, can not get the words out.  Stress may be provoking it.      Francis Lin today, a right -handed Black or [place] American male with a possible sleep disorder. He  has a past medical history of Acid reflux, Allergy, Anxiety, Clotting disorder ([place]), DVT (deep venous thrombosis) ([place]), Esophagitis, Gout, Hypersomnia, persistent,  PONV (postoperative nausea and vomiting), Shifting sleep-work schedule, affecting sleep (06/20/2013), Snoring disorder (06/20/2013), and Tubular adenoma of colon.   The patient had the first sleep study in the year 2014  with a result of an AHI ( Apnea Hypopnea index)  of 10.8/h ,and not seen I follow up.     Sleep relevant medical history: Nocturia/ 1-2, vivid nightmares, possible enactment- sleep talking, obesity,    Family medical /sleep history: no other family member on CPAP with OSA, insomnia, sleep walkers. headaches with sleep-    Social history:  Patient is working as a Artist  for [employer]  and lives in a household with spouse, and 7 children ( blended family) of which 37 are at home. 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    Tobacco use- none .  ETOH use  none ,  Caffeine intake in form Tea ( rarely) . Regular exercise in form of walking.         Sleep habits are as follows:  The patient's dinner time is between 4-5 PM. The patient goes to work at night and goes to bed at 6.30- 11.30 and another nap some afternoons.  The preferred sleep position is left, with the support of 3 pillows. Dreams are reportedly frequent/vivid.  The patient wakes up spontaneously at 11-12 AM , sometimes 4 PM. .  He reports not feeling refreshed or restored in AM, with symptoms such as dry mouth, morning headaches, and residual fatigue.  Naps are taken infrequently, lasting from 15 to 20 minutes.    Review of Systems: Out of a complete 14 system review, the patient complains of only the following symptoms, and all other reviewed systems are negative.:  Fatigue, sleepiness ,  snoring, fragmented sleep, SHIFT WORKER> morning headaches.   Has had nightmares and wakes with a sudden severe headache at some times.  Low back pain-   How likely are you to doze in the following situations: 0 = not likely, 1 = slight chance, 2 = moderate chance, 3 = high chance   Sitting and Reading?2 Watching Television? 1 Sitting inactive in a public place (theater or meeting)?1 As a passenger in a car for an hour without a break?1 Lying down in the afternoon when circumstances permit?2 Sitting and talking to someone?0 Sitting quietly after lunch without alcohol?3 In a  car, while stopped for a few minutes in traffic?1   Total =12 / 24 points   FSS endorsed at na/ 46 points.   Social History   Socioeconomic History   Marital status: Married    Spouse name: Not on file   Number of children: 3   Years of education: college   Highest education level: Not on file  Occupational History    Employer: [employer]    Comment: [employer]  Tobacco Use   Smoking status: Never   Smokeless tobacco: Never  Vaping Use   Vaping Use: Never used  Substance and Sexual Activity   Alcohol use: No   Drug use: No   Sexual activity: Not on file  Other Topics Concern   Not on file  Social History Narrative   Right handed   Caffeine use: tea sometimes   Social Determinants of Health   Financial Resource Strain: Not on file  Food Insecurity: Not on file  Transportation Needs: Not on file  Physical Activity: Not on file  Stress: Not on file  Social Connections: Not on file    Family History  Problem Relation Age of Onset   Lupus Mother    Heart attack Father    Arthritis Father    Hypertension Father    Cancer Father        multiple myeloma   Diabetes Father    Atrial fibrillation Father    Gout Father    Edema Sister    Diabetes Sister    Bipolar disorder Sister    Stroke Sister    Obesity Sister    Adrenal disorder Sister    Lupus Maternal Aunt    Eczema Daughter    ADD / ADHD Daughter    Asthma Daughter    Eczema Daughter    ADD / ADHD Daughter    Hypotension Daughter    Eczema Daughter    High Cholesterol Daughter    Colon cancer Neg Hx    Colon polyps Neg Hx    Esophageal cancer Neg Hx    Rectal cancer Neg Hx    Stomach cancer Neg Hx     Past Medical History:  Diagnosis Date   Acid reflux    Allergy    Anxiety    stress    Clotting disorder ([place])    right leg x 2 in calf and behind knee    DVT (deep venous thrombosis) ([facility])    Esophagitis    Gout    Hypersomnia, persistent    IBS (irritable bowel syndrome)     PONV (postoperative nausea and vomiting)    nausea with no vomiting    Shifting sleep-work schedule, affecting sleep 06/20/2013   Snoring disorder 06/20/2013   Tubular adenoma of colon     Past Surgical History:  Procedure Laterality Date   COLONOSCOPY     ESOPHAGEAL  DILATION     HEMORRHOID BANDING     ORIF TIBIA & FIBULA FRACTURES Right    right leg surgery x 2 places- rod in place knee to ankle    UPPER GASTROINTESTINAL ENDOSCOPY     wisdom teeth     wrist surgery Right      Current Outpatient Medications on File Prior to Visit  Medication Sig Dispense Refill   acetaminophen (TYLENOL) 325 MG tablet Take 650 mg by mouth every 6 (six) hours as needed for mild pain.     colchicine 0.6 MG tablet Take 0.5 tablets (0.3 mg total) by mouth as needed. If tolerated, increase to 0.48m daily after one week (Patient taking differently: Take 0.3 mg by mouth 2 (two) times daily as needed (Gout).) 30 tablet 1   cyanocobalamin 1000 MCG tablet Take 1 tablet (1,000 mcg total) by mouth daily.     cyclobenzaprine (FLEXERIL) 10 MG tablet Take 1 tablet (10 mg total) by mouth 2 (two) times daily as needed for muscle spasms. 20 tablet 0   fluocinolone (VANOS) 0.01 % cream Apply 1 Application topically 2 (two) times daily as needed (Itching).     fluticasone (FLONASE) 50 MCG/ACT nasal spray Place 1 spray into both nostrils daily as needed for allergies or rhinitis.     loratadine (CLARITIN) 10 MG tablet Take 10 mg by mouth daily as needed for allergies.     nystatin cream (MYCOSTATIN) Apply 1 Application topically 2 (two) times daily as needed for dry skin (Itching).     [DISCONTINUED] cimetidine (TAGAMET) 200 MG tablet Take by mouth.     No current facility-administered medications on file prior to visit.    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   Wt Readings from Last 3 Encounters:  06/20/22 (!) 321 lb (145.6 kg)  05/17/22 (!) 325 lb 14.4 oz (147.8 kg)  05/09/22 (!) 330 lb (149.7 kg)     Ht Readings from Last 3 Encounters:  06/20/22 _1  (1.88 m)  05/09/22 _2  (1.88 m)  02/01/22 _3  (1.88 m)      General: The patient is awake, alert and appears not in acute distress. The patient is well groomed. Head: Normocephalic, atraumatic. Neck is supple. Mallampati 3,  neck circumference:18 inches . Nasal airflow is patent.  Retrognathia is no seen.  Dental status: biological  Cardiovascular:  Regular rate and cardiac rhythm by pulse,  without distended neck veins. Respiratory: Lungs are clear to auscultation.  Skin:  Without evidence of ankle edema, or rash. Trunk: The patient's posture is erect.   Neurologic exam : The patient is awake and alert, oriented to place and  time.   Memory subjective described as intact.  Attention span & concentration ability appears normal.  Speech is fluent,  without  dysarthria, dysphonia or aphasia.  Mood and affect are appropriate.   Cranial nerves: no loss of smell or taste reported  Pupils are equal and briskly reactive to light. Funduscopic exam deferred. .  Extraocular movements in vertical and horizontal planes were intact and without nystagmus. No Diplopia. Visual fields by finger perimetry are intact. Hearing was intact to soft voice and finger  rubbing.    Facial sensation intact to fine touch.  Facial motor strength is symmetric and tongue and uvula move midline.  Neck ROM : rotation, tilt and flexion extension were normal for age and shoulder shrug was symmetrical.    Motor exam:  Symmetric bulk, tone and ROM.   Normal tone without cog wheeling, symmetric grip strength .   Sensory:  Fine touch, pinprick and vibration were tested  and  normal.  Proprioception tested in the upper extremities was normal.   Coordination: Rapid alternating movements in the fingers/hands were of normal speed.  The Finger-to-nose maneuver was intact without evidence of ataxia, dysmetria or tremor.   Gait and station: Patient could rise unassisted from a seated position, walked without assistive device.  Stance is of normal width/ base and the patient turned with 3 steps.  Toe and heel walk were deferred.  Deep tendon reflexes: in the  upper and lower extremities are symmetric and intact.  Babinski response was deferred.       After spending a total time of  50  minutes face to face and additional time for physical and neurologic examination, review of laboratory studies,  personal review of imaging studies, reports and results of other testing and review of referral information / records as far as provided in visit,   We spoke together with Mrs ing by phone to summarize dx and work up. I have established the following assessments:  1) I was able to see the complete hospital work-up which was documented in detail.  The patient had undergone 2 EEGs 1 with a regular duration of 30 minutes and 1 overnight EEG both were without epileptiform discharges.  There was 1 button push for a dizziness spell which did not correlate with any EEG correlate with any EEG changes.  The patient had a normal MRI of the brain.  In addition metabolic panel and all kinds of stroke and vascular related lab tests were obtained.  He was found to have to have a normal kidney  function, normal liver function, his blood pressure during the hospitalization also was in normal range.    My Plan is to proceed with:  The patient described episodes 2-3 y times a week with dizziness, burred vision, photophobia and nausea, pressure behind the eye.  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   He also never received treatment for mild OSA in 2014, and is waking up puffing, snoring, and wakes with headaches, repeat sleep study.  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   1)PSG with SPLIT at AHI 20/h. If [employer] objects, will need HST instead.  2) start topiramate for migraine HA prevention.  3) shift worker- 5-6 hours of sleep - EDS would not be surprising.  4) sinus congestion - take claritin ( not D ) at onset of bedtime.   BMI os over 40- 321 pounds,   I  would like to thank [name], MD and Derek Jack, Md [address] [place],  [place] [address] for allowing me to meet with and to take care of this pleasant patient.   I plan to follow up either personally or through our NP within 2-4 months.   CC: I will share my notes with PCP. Patient reports a pain from L3-4 to the groin. This is not addressed in a sleep clinic. I suggest PT and Orthopedic consult, pain management.   Electronically signed by: Larey Seat, MD 06/20/2022 2:02 PM  [facility] Board certified by The [employer] of Sleep Medicine and Diplomate of the [employer] of Sleep Medicine. Board certified In Neurology through the [place], Fellow of the [employer] of Neurology. Medical Director of [facility].

## 2022-07-13 ENCOUNTER — Other Ambulatory Visit: Payer: Self-pay | Admitting: Rheumatology

## 2022-07-13 ENCOUNTER — Telehealth: Payer: Self-pay | Admitting: Neurology

## 2022-07-13 DIAGNOSIS — M1A00X0 Idiopathic chronic gout, unspecified site, without tophus (tophi): Secondary | ICD-10-CM

## 2022-07-13 NOTE — Telephone Encounter (Signed)
[employer] pending faxed notes  

## 2022-07-18 ENCOUNTER — Encounter: Payer: Self-pay | Admitting: Family Medicine

## 2022-07-18 ENCOUNTER — Ambulatory Visit (INDEPENDENT_AMBULATORY_CARE_PROVIDER_SITE_OTHER): Payer: Managed Care, Other (non HMO) | Admitting: Family Medicine

## 2022-07-18 VITALS — BP 122/76 | HR 70 | Temp 97.6°F | Ht 74.0 in | Wt 329.2 lb

## 2022-07-18 DIAGNOSIS — E538 Deficiency of other specified B group vitamins: Secondary | ICD-10-CM

## 2022-07-18 DIAGNOSIS — R899 Unspecified abnormal finding in specimens from other organs, systems and tissues: Secondary | ICD-10-CM | POA: Diagnosis not present

## 2022-07-18 DIAGNOSIS — Z23 Encounter for immunization: Secondary | ICD-10-CM | POA: Diagnosis not present

## 2022-07-18 NOTE — Progress Notes (Signed)
Established Patient Office Visit  Subjective   Patient ID: Francis Lin, male    DOB: [birth year]  Age: 46 y.o. MRN: [id number]  Chief Complaint  Patient presents with   Labs Only   Follow-up    HPI   Comes here for follow-up regarding recent low B12 level.  He had had hospitalization back in the summer with difficulty word finding and was admitted.  At that time differential was seizure versus complex migraine.  EEG showed no evidence for seizure activity.  He has seen neurology once in follow-up since then.  No further episodes of aphasia.  He was started on Topamax per neurology but did not tolerate secondary to side effects.  He had increased sedation.  Recent B12 level 223.  He is not a vegetarian.  No history of gastric surgery.  Does not eat a lot of dairy but does eat some meats.  No egg consumption.  Patient also had recent ethanol level of 10 during hospitalization and he was curious about that since he does not use any alcohol whatsoever.  Patient had read up and was concerned about "alcohol brewery syndrome ".  We explained this to be very rare but also much tricky to diagnose.  Patient is requesting repeat alcohol level today.  Does not ingest any alcohol nor any over-the-counter medications that contain alcohol.  Past Medical History:  Diagnosis Date   Acid reflux    Allergy    Anxiety    stress    Clotting disorder ([facility])    right leg x 2 in calf and behind knee    DVT (deep venous thrombosis) ([facility])    Esophagitis    Gout    Hypersomnia, persistent    IBS (irritable bowel syndrome)    PONV (postoperative nausea and vomiting)    nausea with no vomiting    Shifting sleep-work schedule, affecting sleep 06/20/2013   Snoring disorder 06/20/2013   Tubular adenoma of colon    Past Surgical History:  Procedure Laterality Date   COLONOSCOPY     ESOPHAGEAL DILATION     HEMORRHOID BANDING     ORIF TIBIA & FIBULA FRACTURES Right    right leg surgery x 2 places- rod  in place knee to ankle    UPPER GASTROINTESTINAL ENDOSCOPY     wisdom teeth     wrist surgery Right     reports that he has never smoked. He has never used smokeless tobacco. He reports that he does not drink alcohol and does not use drugs. family history includes ADD / ADHD in his daughter and daughter; Adrenal disorder in his sister; Arthritis in his father; Asthma in his daughter; Atrial fibrillation in his father; Bipolar disorder in his sister; Cancer in his father; Diabetes in his father and sister; Eczema in his daughter, daughter, and daughter; Edema in his sister; Gout in his father; Heart attack in his father; High Cholesterol in his daughter; Hypertension in his father; Hypotension in his daughter; Lupus in his maternal aunt and mother; Obesity in his sister; Stroke in his sister. Allergies  Allergen Reactions   Peanuts [Peanut Oil] Anaphylaxis   Lactose Diarrhea and Other (See Comments)    Lactose intolerance Lactose intolerance Lactose intolerance   Allopurinol     rash   Caffeine Other (See Comments)    "makes feel bad", headaches   Gabapentin Other (See Comments)    Messes with memory   Ibuprofen Other (See Comments)    Early onset of  kidney issues (Stage 3A)   Omeprazole Other (See Comments)    Mood swing   Penicillins Other (See Comments)    Headaches    Seasonal Ic [Cholestatin]     Sneezing,runny nose , itchy eyes    Review of Systems  Constitutional:  Negative for malaise/fatigue.  Eyes:  Negative for blurred vision.  Respiratory:  Negative for shortness of breath.   Cardiovascular:  Negative for chest pain.  Gastrointestinal:  Negative for abdominal pain.  Neurological:  Negative for dizziness, weakness and headaches.      Objective:     BP 122/76 (BP Location: Left Arm, Patient Position: Sitting, Cuff Size: Large)   Pulse 70   Temp 97.6 F (36.4 C) (Oral)   Ht '6\' 2"'$  (1.88 m)   Wt (!) 329 lb 3.2 oz (149.3 kg)   SpO2 99%   BMI 42.27 kg/m     Physical Exam Vitals reviewed.  Constitutional:      Appearance: He is well-developed.  HENT:     Right Ear: External ear normal.     Left Ear: External ear normal.  Eyes:     Pupils: Pupils are equal, round, and reactive to light.  Neck:     Thyroid: No thyromegaly.  Cardiovascular:     Rate and Rhythm: Normal rate and regular rhythm.  Pulmonary:     Effort: Pulmonary effort is normal. No respiratory distress.     Breath sounds: Normal breath sounds. No wheezing or rales.  Musculoskeletal:     Cervical back: Neck supple.  Neurological:     Mental Status: He is alert and oriented to person, place, and time.      No results found for any visits on 07/18/22.    The ASCVD Risk score (Arnett DK, et al., 2019) failed to calculate for the following reasons:   The valid total cholesterol range is 130 to 320 mg/dL    Assessment & Plan:   #1 history of B12 deficiency.  Recheck B12 level today.  Patient has been on oral replacement.  Handout given on dietary factors  #2 patient requesting follow-up ethanol level.  We explained that alcohol brewery syndrome is fairly rare it would not pursue further work-up unless his alcohol levels are climbing   No follow-ups on file.    Carolann Littler, MD

## 2022-07-26 NOTE — Telephone Encounter (Signed)
[employer] denied the Split.  LVM for pt to call back to schedule the HST.  HST: [employer] no auth req ref # [id number]

## 2022-08-01 ENCOUNTER — Telehealth: Payer: Self-pay | Admitting: Rheumatology

## 2022-08-01 ENCOUNTER — Other Ambulatory Visit: Payer: Self-pay | Admitting: *Deleted

## 2022-08-01 ENCOUNTER — Other Ambulatory Visit: Payer: Self-pay | Admitting: Rheumatology

## 2022-08-01 ENCOUNTER — Other Ambulatory Visit: Payer: Self-pay | Admitting: Family Medicine

## 2022-08-01 DIAGNOSIS — M1A00X0 Idiopathic chronic gout, unspecified site, without tophus (tophi): Secondary | ICD-10-CM

## 2022-08-01 DIAGNOSIS — M1A9XX0 Chronic gout, unspecified, without tophus (tophi): Secondary | ICD-10-CM

## 2022-08-01 MED ORDER — COLCHICINE 0.6 MG PO TABS: ORAL_TABLET | ORAL | 0 refills | Status: DC

## 2022-08-01 NOTE — Telephone Encounter (Signed)
Patient called requesting prescription refill of Colchicine to be sent to [facility].  Patient states he is having a gout flair and is out of medication.  Patient scheduled follow-up appointment with Lovena Le on Friday, 08/05/22.

## 2022-08-01 NOTE — Progress Notes (Signed)
Office Visit Note  Patient: Francis Lin             Date of Birth: 03-05-1976           MRN: [id number]             PCP: [name], MD Referring: [name], MD Visit Date: 08/02/2022 Occupation: '@GUAROCC' @  Subjective:  Right foot pain   History of Present Illness: Francis Lin is a 46 y.o. male with history of gout and osteoarthritis.  Patient is not currently taking any urate lowering medications.  He was last seen in the office on 11/09/2021 at which time the plan was to initiate Uloric 40 mg daily and continue colchicine 0.6 mg 1 tablet daily.  He has not yet started on Uloric.  He has been taking colchicine consistently but ran out of his prescription.  He states that he recently was eating more shellfish and drinking more soda than usual which she feels has led to a flare.  He has been experiencing pain and swelling in the right foot for the past 4 to 5 days.  He currently rates his pain an 8 out of 10 and has had difficulty wearing a shoe due to the severity of pain and swelling.  He has not yet picked up his prescription of colchicine from the pharmacy.  Activities of Daily Living:  Patient reports morning stiffness for 2-5 hours.   Patient Denies nocturnal pain.  Difficulty dressing/grooming: Denies Difficulty climbing stairs: Reports Difficulty getting out of chair: Reports Difficulty using hands for taps, buttons, cutlery, and/or writing: Denies  Review of Systems  Constitutional:  Positive for fatigue. Negative for night sweats.  HENT:  Negative for mouth sores, mouth dryness and nose dryness.   Eyes:  Negative for redness and dryness.  Respiratory:  Negative for difficulty breathing.   Cardiovascular:  Negative for palpitations, hypertension, irregular heartbeat and swelling in legs/feet.  Gastrointestinal:  Negative for blood in stool, constipation and diarrhea.  Endocrine: Negative for increased urination.  Genitourinary:  Negative for  painful urination and involuntary urination.  Musculoskeletal:  Positive for joint pain, gait problem, joint pain, joint swelling, myalgias, muscle weakness, morning stiffness, muscle tenderness and myalgias.  Skin:  Negative for color change, rash, hair loss, nodules/bumps, skin tightness, ulcers and sensitivity to sunlight.  Allergic/Immunologic: Negative for susceptible to infections.  Neurological:  Positive for headaches. Negative for dizziness, fainting, memory loss, night sweats and weakness.  Hematological:  Negative for swollen glands.  Psychiatric/Behavioral:  Negative for depressed mood and sleep disturbance. The patient is nervous/anxious.     PMFS History:  Patient Active Problem List   Diagnosis Date Noted  . OSA (obstructive sleep apnea) 06/20/2022  . Episodic cluster headache, not intractable 06/20/2022  . Sinus congestion 06/20/2022  . Morbid obesity ([place]) 06/20/2022  . Episode of change in speech   . Aphasia 05/10/2022  . LUQ abdominal pain 11/03/2020  . Generalized abdominal pain 11/03/2020  . Altered bowel habits 11/03/2020  . Nausea without vomiting 11/03/2020  . Migraine without aura and without status migrainosus, not intractable 11/22/2018  . Nonsustained ventricular tachycardia ([place]) 08/03/2018  . Elevated serum creatinine 06/18/2018  . Slow heart rate 06/06/2018  . Shortness of breath 04/04/2018  . Venous insufficiency 01/05/2017  . Deep vein thrombosis (DVT) of popliteal vein of right lower extremity ([place]) 06/30/2016  . S/P ORIF (open reduction internal fixation) fracture 06/22/2016  . Multiple fractures of both lower extremities 05/31/2016  .  Elevated CK 05/17/2016  . Fracture of right fibula 05/17/2016  . Fracture of right tibia 05/17/2016  . Fracture of tibia, left, closed 05/17/2016  . Trauma 05/17/2016  . Situational anxiety 08/28/2014  . Unspecified vitamin D deficiency 09/22/2013  . Shifting sleep-work schedule, affecting sleep 06/20/2013  .  Snoring disorder 06/20/2013  . Hypersomnia, persistent   . History of esophageal stricture 04/16/2013  . Pain in joint, ankle and foot 04/16/2013  . Gout 04/16/2013  . 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 OBESITY, CLASS II, BMI 35- 39.9 03/15/2012  . Hypertriglyceridemia 03/15/2012  . Atypical chest pain 12/02/2008  . Dysphagia 12/02/2008    Past Medical History:  Diagnosis Date  . Acid reflux   . Allergy   . Anxiety    stress   . Clotting disorder (HCC)    right leg x 2 in calf and behind knee   . DVT (deep venous thrombosis) ([place])   . Esophagitis   . Gout   . Hypersomnia, persistent   . IBS (irritable bowel syndrome)   . PONV (postoperative nausea and vomiting)    nausea with no vomiting   . Shifting sleep-work schedule, affecting sleep 06/20/2013  . Snoring disorder 06/20/2013  . Tubular adenoma of colon     Family History  Problem Relation Age of Onset  . Lupus Mother   . Heart attack Father   . Arthritis Father   . Hypertension Father   . Cancer Father        multiple myeloma  . Diabetes Father   . Atrial fibrillation Father   . Gout Father   . Kidney failure Father   . Edema Sister   . Diabetes Sister   . Bipolar disorder Sister   . Stroke Sister   . Obesity Sister   . Adrenal disorder Sister   . Lupus Maternal Aunt   . Eczema Daughter   . ADD / ADHD Daughter   . Asthma Daughter   . Eczema Daughter   . ADD / ADHD Daughter   . Hypotension Daughter   . Eczema Daughter   . High Cholesterol Daughter   . Colon cancer Neg Hx   . Colon polyps Neg Hx   . Esophageal cancer Neg Hx   . Rectal cancer Neg Hx   . Stomach cancer Neg Hx    Past Surgical History:  Procedure Laterality Date  . COLONOSCOPY    . ESOPHAGEAL DILATION    . HEMORRHOID BANDING    . ORIF TIBIA & FIBULA FRACTURES Right    right leg surgery x 2 places- rod in place knee to ankle   . UPPER GASTROINTESTINAL ENDOSCOPY    . wisdom teeth    . wrist surgery Right    Social History    Social History Narrative   Right handed   Caffeine use: tea sometimes   Immunization History  Administered Date(s) Administered  . Influenza,inj,Quad PF,6+ Mos 07/18/2022  . PFIZER(Purple Top)SARS-COV-2 Vaccination 12/19/2019, 01/09/2020, 11/09/2020  . Tdap 08/19/2011, 05/17/2016     Objective: Vital Signs: BP 137/82 (BP Location: Left Arm, Patient Position: Sitting, Cuff Size: Large)   Pulse 86   Resp 14   Ht 6' 2.5" (1.892 m)   Wt (!) 331 lb 9.6 oz (150.4 kg)   BMI 42.01 kg/m    Physical Exam Vitals and nursing note reviewed.  Constitutional:      Appearance: He is well-developed.  HENT:     Head: Normocephalic and atraumatic.  Eyes:  Conjunctiva/sclera: Conjunctivae normal.     Pupils: Pupils are equal, round, and reactive to light.  Cardiovascular:     Rate and Rhythm: Normal rate and regular rhythm.     Heart sounds: Normal heart sounds.  Pulmonary:     Effort: Pulmonary effort is normal.     Breath sounds: Normal breath sounds.  Abdominal:     General: Bowel sounds are normal.     Palpations: Abdomen is soft.  Musculoskeletal:     Cervical back: Normal range of motion and neck supple.  Skin:    General: Skin is warm and dry.     Capillary Refill: Capillary refill takes less than 2 seconds.  Neurological:     Mental Status: He is alert and oriented to person, place, and time.  Psychiatric:        Behavior: Behavior normal.     Musculoskeletal Exam: C-spine, thoracic spine, lumbar spine have good range of motion.  No midline spinal tenderness.  Shoulder joints, elbow joints, wrist joints, MCPs, PIPs, DIPs have good range of motion with no synovitis.  Complete fist formation bilaterally.  Hip joints have good range of motion with no groin pain.  Knee joints have good range of motion with no warmth or effusion.  Ankle joints have good range of motion.  Tenderness and diffuse swelling on the dorsal aspect of the right foot.  Tenderness, swelling, and erythema  over the right great toe noted.  CDAI Exam: CDAI Score: -- Patient Global: --; Provider Global: -- Swollen: --; Tender: -- Joint Exam 08/02/2022   No joint exam has been documented for this visit   There is currently no information documented on the homunculus. Go to the Rheumatology activity and complete the homunculus joint exam.  Investigation: No additional findings.  Imaging: No results found.  Recent Labs: Lab Results  Component Value Date   WBC 6.2 05/11/2022   HGB 15.4 05/11/2022   PLT 234 05/11/2022   NA 138 05/11/2022   K 3.8 05/11/2022   CL 106 05/11/2022   CO2 24 05/11/2022   GLUCOSE 82 05/11/2022   BUN 12 05/11/2022   CREATININE 1.36 (H) 05/11/2022   BILITOT 0.7 05/09/2022   ALKPHOS 62 05/09/2022   AST 19 05/09/2022   ALT 19 05/09/2022   PROT 7.0 05/09/2022   ALBUMIN 4.1 05/09/2022   CALCIUM 8.7 (L) 05/11/2022   GFRAA 58 (L) 03/22/2020    Speciality Comments: Allopurinol-rash  Procedures:  No procedures performed Allergies: Peanuts [peanut oil], Lactose, Allopurinol, Caffeine, Gabapentin, Ibuprofen, Omeprazole, Penicillins, and Seasonal ic [cholestatin]   Assessment / Plan:     Visit Diagnoses: Chronic gouty arthropathy - History of frequent gout flares.  Not crystal proven.  Patient was last seen in the office on 11/09/2021 at which time the plan was to initiate Uloric 40 mg daily and restart on colchicine 0.6 mg 1 tablet daily.  He previously had an intolerance to allopurinol.  He has not yet started on Uloric due to being apprehensive about possible side effects.  He presents today with a flare in his right foot which started 4 to 5 days ago after running out of his prescription for colchicine.  He recently had some shrimp and soda which he feels contributed to his flare. On examination he has diffuse tenderness, swelling, and erythema on the dorsal aspect of the right foot and inflammation in the right great toe.  His pain is currently an 8 out of 10  and has had difficulty wearing  a shoe due to the severity of pain and swelling. Different treatment options were discussed today in detail.  Indications, contraindications, potential side effects of Uloric were discussed.  He is willing to start on Uloric pending lab results today.  A refill of colchicine was sent to the pharmacy yesterday.  Discussed that he can take colchicine 0.6 mg 1 tablet twice daily during the flare and then reduce to 1 tablet daily. Discussed the importance of avoiding a purine rich diet.  He was given a handout of dietary recommendations to follow.  All questions were addressed. CBC, CMP, and uric acid level were checked today.  He will follow-up in the office in 6 to 8 weeks or sooner if needed.    - Plan: CBC with Differential/Platelet, COMPLETE METABOLIC PANEL WITH GFR, Uric acid  Primary osteoarthritis of both hands: X-rays were consistent with osteoarthritis on 11/02/2021.  He experiences intermittent pain and stiffness in both hands but has no active inflammation on examination today.  He is unable to make a complete fist bilaterally.  Primary osteoarthritis of both knees - Bilateral moderate osteoarthritis and mild chondromalacia patella.  Good range of motion of both knee joints on examination today.  No warmth or effusion noted.  Pain in both feet - History of intermittent swelling.  He presents today with a gout flare in the right foot.  He has diffuse welling, erythema, and tenderness on the dorsal aspect of the right foot as well as inflammation in the right great toe.  Patient was advised to take colchicine 0.6 mg 1 tablet twice daily until the flare has resolved and then he can reduce to colchicine 0.6 mg 1 tablet daily.  Other medical conditions are listed as follows:  Shortness of breath - Groundglass attenuation noted on the CT scan.  He is followed by Dr. Vaughan Browner.  Multiple pulmonary nodules - ACE elevated at 126.  According to Dr. Matilde Bash note his CT  findings were not consistent with sarcoidosis.  S/P ORIF (open reduction internal fixation) fracture - Crush injury 2017.  History of fracture of tibia  History of fibula fracture  Elevated serum creatinine - History of chronic use of NSAIDs. Referred to nephrology.   Other medical conditions are listed as follows:  Rash - History of recurrent rash in the axillary and inguinal region.   Venous insufficiency  History of DVT (deep vein thrombosis) - In 2017 with crush injury  Hypertriglyceridemia  Nonsustained ventricular tachycardia (HCC)  Migraine without aura and without status migrainosus, not intractable  History of esophageal stricture  Situational anxiety  Hypersomnia, persistent  Class 3 severe obesity with body mass index (BMI) of 40.0 to 44.9 in adult, unspecified obesity type, unspecified whether serious comorbidity present [facility])  Orders: Orders Placed This Encounter  Procedures  . CBC with Differential/Platelet  . COMPLETE METABOLIC PANEL WITH GFR  . Uric acid   No orders of the defined types were placed in this encounter.  Follow-Up Instructions: Return in about 2 months (around 10/02/2022) for Gout, Osteoarthritis.   Ofilia Neas, PA-C  Note - This record has been created using Dragon software.  Chart creation errors have been sought, but may not always  have been located. Such creation errors do not reflect on  the standard of medical care.

## 2022-08-01 NOTE — Telephone Encounter (Signed)
Ok to refill colchicine.  We will discuss medication regimen tomorrow in detail.

## 2022-08-01 NOTE — Telephone Encounter (Signed)
Next Visit: 08/02/2022  Last Visit: 11/09/2021  Last Fill: 11/02/2021  DX: Chronic gouty arthropathy   Current Dose per office note 11/09/2021:  colchicine to 0.6 mg p.o. twice daily  Labs: 11/02/2021 Uric acid 7.8 Creat 1.49, eGFR 59, SED rate 19, ACE level was elevated.  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  Sarcoidosis is in the differential now with elevated ACE.   Okay to refill colchicine?

## 2022-08-01 NOTE — Telephone Encounter (Signed)
Pt call and want a refill on colchicine 0.6 MG tablet sent to  [facility] [id number] [place], [place] - [address] Phone:  [phone]  Fax:  [phone]

## 2022-08-01 NOTE — Telephone Encounter (Signed)
Last appt 11/02/2021, last uric acid 11/02/2021 7.8, patient has appt 08/02/2022, please advise. Last rx's for colchicine has been denied.

## 2022-08-02 ENCOUNTER — Ambulatory Visit: Payer: Managed Care, Other (non HMO) | Attending: Physician Assistant | Admitting: Physician Assistant

## 2022-08-02 ENCOUNTER — Encounter: Payer: Self-pay | Admitting: Physician Assistant

## 2022-08-02 VITALS — BP 137/82 | HR 86 | Resp 14 | Ht 74.5 in | Wt 331.6 lb

## 2022-08-02 DIAGNOSIS — M19041 Primary osteoarthritis, right hand: Secondary | ICD-10-CM | POA: Diagnosis not present

## 2022-08-02 DIAGNOSIS — M1A9XX0 Chronic gout, unspecified, without tophus (tophi): Secondary | ICD-10-CM

## 2022-08-02 DIAGNOSIS — E781 Pure hyperglyceridemia: Secondary | ICD-10-CM

## 2022-08-02 DIAGNOSIS — Z86718 Personal history of other venous thrombosis and embolism: Secondary | ICD-10-CM

## 2022-08-02 DIAGNOSIS — M79671 Pain in right foot: Secondary | ICD-10-CM

## 2022-08-02 DIAGNOSIS — R21 Rash and other nonspecific skin eruption: Secondary | ICD-10-CM

## 2022-08-02 DIAGNOSIS — G43009 Migraine without aura, not intractable, without status migrainosus: Secondary | ICD-10-CM

## 2022-08-02 DIAGNOSIS — M1A00X0 Idiopathic chronic gout, unspecified site, without tophus (tophi): Secondary | ICD-10-CM | POA: Diagnosis not present

## 2022-08-02 DIAGNOSIS — I872 Venous insufficiency (chronic) (peripheral): Secondary | ICD-10-CM

## 2022-08-02 DIAGNOSIS — G4710 Hypersomnia, unspecified: Secondary | ICD-10-CM

## 2022-08-02 DIAGNOSIS — M79641 Pain in right hand: Secondary | ICD-10-CM | POA: Diagnosis not present

## 2022-08-02 DIAGNOSIS — I4729 Other ventricular tachycardia: Secondary | ICD-10-CM

## 2022-08-02 DIAGNOSIS — Z8781 Personal history of (healed) traumatic fracture: Secondary | ICD-10-CM

## 2022-08-02 DIAGNOSIS — R7989 Other specified abnormal findings of blood chemistry: Secondary | ICD-10-CM

## 2022-08-02 DIAGNOSIS — R918 Other nonspecific abnormal finding of lung field: Secondary | ICD-10-CM

## 2022-08-02 DIAGNOSIS — M79642 Pain in left hand: Secondary | ICD-10-CM

## 2022-08-02 DIAGNOSIS — Z8719 Personal history of other diseases of the digestive system: Secondary | ICD-10-CM

## 2022-08-02 DIAGNOSIS — M79672 Pain in left foot: Secondary | ICD-10-CM

## 2022-08-02 DIAGNOSIS — M170 Bilateral primary osteoarthritis of knee: Secondary | ICD-10-CM

## 2022-08-02 DIAGNOSIS — M19042 Primary osteoarthritis, left hand: Secondary | ICD-10-CM

## 2022-08-02 DIAGNOSIS — F418 Other specified anxiety disorders: Secondary | ICD-10-CM

## 2022-08-02 DIAGNOSIS — R0602 Shortness of breath: Secondary | ICD-10-CM

## 2022-08-02 DIAGNOSIS — Z9889 Other specified postprocedural states: Secondary | ICD-10-CM

## 2022-08-02 DIAGNOSIS — Z6841 Body Mass Index (BMI) 40.0 and over, adult: Secondary | ICD-10-CM

## 2022-08-02 NOTE — Patient Instructions (Addendum)
Information for patients with Gout  Gout defined-Gout occurs when urate crystals accumulate in your joint causing the inflammation and intense pain of gout attack.  Urate crystals can form when you have high levels of uric acid in your blood.  Your body produces uric acid when it breaks down prurines-substances that are found naturally in your body, as well as in certain foods such as organ meats, anchioves, herring, asparagus, and mushrooms.  Normally uric acid dissolves in your blood and passes through your kidneys into your urine.  But sometimes your body either produces too much uric acid or your kidneys excrete too little uric acid.  When this happens, uric acid can build up, forming sharp needle-like urate crystals in a joint or surrounding tissue that cause pain, inflammation and swelling.    Gout is characterized by sudden, severe attacks of pain, redness and tenderness in joints, often the joint at the base of the big toe.  Gout is complex form of arthritis that can affect anyone.  Men are more likely to get gout but women become increasingly more susceptible to gout after menopause.  An acute attack of gout can wake you up in the middle of the night with the sensation that your big toe is on fire.  The affected joint is hot, swollen and so tender that even the weight or the sheet on it may seem intolerable.  If you experience symptoms of an acute gout attack it is important to your doctor as soon as the symptoms start.  Gout that goes untreated can lead to worsening pain and joint damage.  Risk Factors:  You are more likely to develop gout if you have high levels of uric acid in your body.    Factors that increase the uric acid level in your body include:  Lifestyle factors.  Excessive alcohol use-generally more than two drinks a day for men and more than one for women increase the risk of gout.  Medical conditions.  Certain conditions make it more likely that you will develop gout.   These include hypertension, and chronic conditions such as diabetes, high levels of fat and cholesterol in the blood, and narrowing of the arteries.  Certain medications.  The uses of Thiazide diuretics- commonly used to treat hypertension and low dose aspirin can also increase uric acid levels.  Family history of gout.  If other members of your family have had gout, you are more likely to develop the disease.  Age and sex. Gout occurs more often in men than it does in women, primarily because women tend to have lower uric acid levels than men do.  Men are more likely to develop gout earlier usually between the ages of 40-50- whereas women generally develop signs and symptoms after menopause.    Tests and diagnosis:  Tests to help diagnose gout may include:  Blood test.  Your doctor may recommend a blood test to measure the uric acid level in your blood .  Blood tests can be misleading, though.  Some people have high uric acid levels but never experience gout.  And some people have signs and symptoms of gout, but don't have unusual levels of uric acid in their blood.  Joint fluid test.  Your doctor may use a needle to draw fluid from your affected joint.  When examined under the microscope, your joint fluid may reveal urate crystals.  Treatment:  Treatment for gout usually involves medications.  What medications you and your doctor choose will be   based on your current health and other medications you currently take.  Gout medications can be used to treat acute gout attacks and prevent future attacks as well as reduce your risk of complications from gout such as the development of tophi from urate crystal deposits.  Alternative medicine:   Certain foods have been studied for their potential to lower uric acid levels, including:  Coffee.  Studies have found an association between coffee drinking (regular and decaf) and lower uric acid levels.  The evidence is not enough to encourage  non-coffee drinkers to start, but it may give clues to new ways of treating gout in the future.  Vitamin C.  Supplements containing vitamin C may reduce the levels of uric acid in your blood.  However, vitamin as a treatment for gout. Don't assume that if a little vitamin C is good, than lots is better.  Megadoses of vitamin C may increase your bodies uric acid levels.  Cherries.  Cherries have been associated with lower levels of uric acid in studies, but it isn't clear if they have any effect on gout signs and symptoms.  Eating more cherries and other dar-colored fruits, such as blackberries, blueberries, purple grapes and raspberries, may be a safe way to support your gout treatment.    Lifestyle/Diet Recommendations:  Drink 8 to 16 cups ( about 2 to 4 liters) of fluid each day, with at least half being water. Avoid alcohol Eat a moderate amount of protein, preferably from healthy sources, such as low-fat or fat-free dairy, tofu, eggs, and nut butters. Limit you daily intake of meat, fish, and poultry to 4 to 6 ounces. Avoid high fat meats and desserts. Decrease you intake of shellfish, beef, lamb, pork, eggs and cheese. Choose a good source of vitamin C daily such as citrus fruits, strawberries, broccoli,  brussel sprouts, papaya, and cantaloupe.  Choose a good source of vitamin A every other day such as yellow fruits, or dark green/yellow vegetables. Avoid drastic weigh reduction or fasting.  If weigh loss is desired lose it over a period of several months. See "dietary considerations.." chart for specific food recommendations.  Dietary Considerations for people with Gout  Food with negligible purine content (0-15 mg of purine nitrogen per 100 grams food)  May use as desired except on calorie variations  Non fat milk Cocoa Cereals (except in list II) Hard candies  Buttermilk Carbonated drinks Vegetables (except in list II) Sherbet  Coffee Fruits Sugar Honey  Tea Cottage Cheese  Gelatin-jell-o Salt  Fruit juice Breads Angel food Cake   Herbs/spices Jams/Jellies [employer]    Foods that do not contain excessive purine content, but must be limited due to fat content  Cream Eggs Oil and Salad Dressing  Half and Half Peanut Butter Chocolate  Whole Milk Cakes Potato Chips  Butter Ice Cream Fried Foods  Cheese Nuts Waffles, pancakes   List II: Food with moderate purine content (50-150 mg of purine nitrogen per 100 grams of food)  Limit total amount each day to 5 oz. cooked Lean meat, other than those on list III   Poultry, other than those on list III Fish, other than those on list III   Seafood, other than those on list III  These foods may be used occasionally  Peas Lentils Bran  Spinach Oatmeal Dried Beans and Peas  Asparagus Wheat Germ Mushrooms   Additional information about meat choices  Choose fish and poultry, particularly without skin, often.  Select lean, well  trimmed cuts of meat.  Avoid all fatty meats, bacon , sausage, fried meats, fried fish, or poultry, luncheon meats, cold cuts, hot dogs, meats canned or frozen in gravy, spareribs and frozen and packaged prepared meats.   List III: Foods with HIGH purine content / Foods to AVOID (150-800 mg of purine nitrogen per 100 grams of food)  Anchovies Herring Meat Broths  Liver Mackerel Meat Extracts  Kidney Scallops Meat Drippings  Sardines Wild Game Mincemeat  Sweetbreads Goose Gravy  Heart Tongue Yeast, baker's and brewers   Commercial soups made with any of the foods listed in List II or List III  In addition avoid all alcoholic beverages    Febuxostat Tablets What is this medication? FEBUXOSTAT (feb UX oh stat) prevents gout attacks. It is prescribed when other medications have not worked or cannot be tolerated. It works by decreasing uric acid levels in your body. This medicine may be used for other purposes; ask your health care provider or pharmacist if you have  questions. COMMON BRAND NAME(S): Uloric What should I tell my care team before I take this medication? They need to know if you have any of these conditions: Heart disease History of heart attack or stroke Kidney disease Liver disease Taking azathioprine or mercaptopurine An unusual or allergic reaction to febuxostat, other medications, foods, dyes, or preservatives Pregnant or trying to get pregnant Breast-feeding How should I use this medication? Take this medication by mouth with water. Take it as directed on the prescription label at the same time every day. You can take it with or without food. If it upsets your stomach, take it with food. Keep taking it unless your care team tells you to stop. A special MedGuide will be given to you by the pharmacist with each prescription and refill. Be sure to read this information carefully each time. Talk to your care team about the use of this medication in children. Special care may be needed. Overdosage: If you think you have taken too much of this medicine contact a poison control center or emergency room at once. NOTE: This medicine is only for you. Do not share this medicine with others. What if I miss a dose? If you miss a dose, take it as soon as you can. If it is almost time for your next dose, take only that dose. Do not take double or extra doses. What may interact with this medication? Do not take this medication with any of the following: Azathioprine Mercaptopurine This medication may also interact with the following: Aminophylline Certain medications for cancer Pegloticase Rasburicase Theophylline This list may not describe all possible interactions. Give your health care provider a list of all the medicines, herbs, non-prescription drugs, or dietary supplements you use. Also tell them if you smoke, drink alcohol, or use illegal drugs. Some items may interact with your medicine. What should I watch for while using this  medication? Visit your care team for regular checks on your progress. Tell your care team if your symptoms do not start to get better or if they get worse. You will need regular blood tests while you are taking this medication. Your gout may flare up when you are first taking this medication. Do not stop taking this medication, even if you have a flare. Your care team may give you another medication to help treat the gout pain. This medication may cause serious skin reactions. They can happen weeks to months after starting the medication. Contact your care team right  away if you notice fevers or flu-like symptoms with a rash. The rash may be red or purple and then turn into blisters or peeling of the skin. You may also notice a red rash with swelling of the face, lips, or lymph nodes in your neck or under your arms. What side effects may I notice from receiving this medication? Side effects that you should report to your care team as soon as possible: Allergic reactions--skin rash, itching, hives, swelling of the face, lips, tongue, or throat Heart attack--pain or tightness in the chest, shoulders, arms, or jaw, nausea, shortness of breath, cold or clammy skin, feeling faint or lightheaded Liver injury--right upper belly pain, loss of appetite, nausea, light-colored stool, dark yellow or brown urine, yellowing skin or eyes, unusual weakness or fatigue Rash, fever, and swollen lymph nodes Redness, blistering, peeling, or loosening of the skin, including inside the mouth Stroke--sudden numbness or weakness of the face, arm, or leg, trouble speaking, confusion, trouble walking, loss of balance or coordination, dizziness, severe headache, change in vision Side effects that usually do not require medical attention (report to your care team if they continue or are bothersome): Joint pain Nausea Skin rash This list may not describe all possible side effects. Call your doctor for medical advice about side  effects. You may report side effects to FDA at 1-800-FDA-1088. Where should I keep my medication? Keep out of the reach of children and pets. Store at room temperature between 20 and 25 degrees C (68 and 77 degrees F). Protect from light and moisture. Keep the container tightly closed. Get rid of any unused medication after the expiration date. To get rid of medications that are no longer needed or have expired: Take the medication to a medication take-back program. Check with your pharmacy or law enforcement to find a location. If you cannot return the medication, check the label or package insert to see if the medication should be thrown out in the garbage or flushed down the toilet. If you are not sure, ask your care team. If it is safe to put it in the trash, take the medication out of the container. Mix the medication with cat litter, dirt, coffee grounds, or other unwanted substance. Seal the mixture in a bag or container. Put it in the trash. NOTE: This sheet is a summary. It may not cover all possible information. If you have questions about this medicine, talk to your doctor, pharmacist, or health care provider.  2023 Elsevier/Gold Standard (2021-12-29 00:00:00)

## 2022-08-02 NOTE — Telephone Encounter (Signed)
Rx was sent on 08/01/2022 by Ofilia Neas, PA-C

## 2022-08-03 ENCOUNTER — Other Ambulatory Visit: Payer: Self-pay

## 2022-08-03 ENCOUNTER — Ambulatory Visit: Payer: Managed Care, Other (non HMO) | Admitting: Neurology

## 2022-08-03 DIAGNOSIS — G4726 Circadian rhythm sleep disorder, shift work type: Secondary | ICD-10-CM

## 2022-08-03 DIAGNOSIS — R0981 Nasal congestion: Secondary | ICD-10-CM

## 2022-08-03 DIAGNOSIS — G44019 Episodic cluster headache, not intractable: Secondary | ICD-10-CM

## 2022-08-03 DIAGNOSIS — G43009 Migraine without aura, not intractable, without status migrainosus: Secondary | ICD-10-CM

## 2022-08-03 DIAGNOSIS — G4733 Obstructive sleep apnea (adult) (pediatric): Secondary | ICD-10-CM | POA: Diagnosis not present

## 2022-08-03 LAB — CBC WITH DIFFERENTIAL/PLATELET
Absolute Monocytes: 1021 cells/uL — ABNORMAL HIGH (ref 200–950)
Basophils Absolute: 74 cells/uL (ref 0–200)
Basophils Relative: 0.8 %
Eosinophils Absolute: 202 cells/uL (ref 15–500)
Eosinophils Relative: 2.2 %
HCT: 45 % (ref 38.5–50.0)
Hemoglobin: 15.1 g/dL (ref 13.2–17.1)
Lymphs Abs: 1987 cells/uL (ref 850–3900)
MCH: 29.4 pg (ref 27.0–33.0)
MCHC: 33.6 g/dL (ref 32.0–36.0)
MCV: 87.7 fL (ref 80.0–100.0)
MPV: 10.1 fL (ref 7.5–12.5)
Monocytes Relative: 11.1 %
Neutro Abs: 5916 cells/uL (ref 1500–7800)
Neutrophils Relative %: 64.3 %
Platelets: 281 10*3/uL (ref 140–400)
RBC: 5.13 10*6/uL (ref 4.20–5.80)
RDW: 13.2 % (ref 11.0–15.0)
Total Lymphocyte: 21.6 %
WBC: 9.2 10*3/uL (ref 3.8–10.8)

## 2022-08-03 LAB — COMPLETE METABOLIC PANEL WITH GFR
AG Ratio: 1.4 (calc) (ref 1.0–2.5)
ALT: 16 U/L (ref 9–46)
AST: 15 U/L (ref 10–40)
Albumin: 4.1 g/dL (ref 3.6–5.1)
Alkaline phosphatase (APISO): 79 U/L (ref 36–130)
BUN/Creatinine Ratio: 9 (calc) (ref 6–22)
BUN: 12 mg/dL (ref 7–25)
CO2: 30 mmol/L (ref 20–32)
Calcium: 9 mg/dL (ref 8.6–10.3)
Chloride: 104 mmol/L (ref 98–110)
Creat: 1.3 mg/dL — ABNORMAL HIGH (ref 0.60–1.29)
Globulin: 3 g/dL (calc) (ref 1.9–3.7)
Glucose, Bld: 74 mg/dL (ref 65–99)
Potassium: 4.1 mmol/L (ref 3.5–5.3)
Sodium: 142 mmol/L (ref 135–146)
Total Bilirubin: 0.7 mg/dL (ref 0.2–1.2)
Total Protein: 7.1 g/dL (ref 6.1–8.1)
eGFR: 69 mL/min/{1.73_m2} (ref >=60)

## 2022-08-03 LAB — URIC ACID: Uric Acid, Serum: 8.1 mg/dL — ABNORMAL HIGH (ref 4.0–8.0)

## 2022-08-03 MED ORDER — FEBUXOSTAT 40 MG PO TABS: 40.0000 mg | ORAL_TABLET | Freq: Every day | ORAL | 2 refills | Status: DC

## 2022-08-03 NOTE — Telephone Encounter (Signed)
Labs: Uric Acid 8.1, Creat. 1.30, Absolute Monocytes 1,021

## 2022-08-03 NOTE — Telephone Encounter (Signed)
Pending lab results, patient will be starting on uloric '40mg'$  per Hazel Sams, PA-C. Thanks!

## 2022-08-03 NOTE — Progress Notes (Signed)
Absolute monocytes are borderline elevated.  Rest of CBC within normal limits.  Creatinine remains elevated 1.30.  GFR is 69.  Rest of CMP within normal limits.  Uric acid is elevated 8.1.  Plan is to start Uloric 40 mg 1 tablet daily.

## 2022-08-05 ENCOUNTER — Ambulatory Visit: Payer: Managed Care, Other (non HMO) | Admitting: Physician Assistant

## 2022-08-10 ENCOUNTER — Telehealth: Payer: Self-pay

## 2022-08-10 NOTE — Telephone Encounter (Signed)
I spoke with the patient.  He is r/s for 08/16/22 at 2:15 pm at [facility].

## 2022-08-10 NOTE — Telephone Encounter (Signed)
Patient mail out sleep study results did not upload. Error with uploading. Pt will need to reschedule to pick up reusable HST device and repeat sleep study.

## 2022-08-23 ENCOUNTER — Encounter: Payer: Self-pay | Admitting: Neurology

## 2022-08-23 NOTE — Progress Notes (Signed)
[facility] [place] TEST REPORT ( by [facility] PAT) mail out  STUDY DATA:  available as of 08-23-22 DOB:  [birth year]    ORDERING CLINICIAN: Larey Seat, MD  REFERRING CLINICIAN: Su Monks, MD    CLINICAL INFORMATION/HISTORY: Transient aphasia. Went to hospital on 05/09/22 to r/o stroke. Cleared. Had EEG - negative, MRI and EEG here to review- neuro consult . Here to f/u with neurology. Possibly migraines? Lack of sleep? (Works night shift). Getting 3-4 migraines per week. Pain/severity depends on migraine episode. Has nausea. Sometimes has L arm numbness.  Does snore while sleeping.   Epworth sleepiness score: 12/24. FSS at na/ 63 points.    BMI: 41.3 kg/m   Neck Circumference: 18"   FINDINGS:   Sleep Summary:   Total Recording Time (hours, min):  10 hours  Total Sleep Time (hours, min):    7 hours 23 minutes             Percent REM (%):    35%   Sleep latency was 18 minutes, REM sleep latency 25 minutes there were 18 periods of wakefulness.                                   Respiratory Indices:   Calculated pAHI (per hour): 16.4/h                            REM pAHI:   29/h                                              NREM pAHI:   9.8/h                           Positional AHI: The patient slept 220 minutes in supine position associated with an AHI of 24.4/h and 224 minutes in nonsupine with an AHI of 8.7/h.  Breaking down the nonsupine sleep positions he did best when sleeping on his left with an AHI of 1.1/h and second on his right with an AHI of 9.7/h.  Snoring level reached a mean volume of 41 dB and was present for about 12% of total sleep time.                                                    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  There was no significant hypoxemia seen during sleep and snoring was mild to moderate.  My hope is still that treatment of sleep apnea may also improve the patient's migraine condition.  RECOMMENDATION: REM sleep dependent sleep apnea responds best to positive airway pressure therapy and will also improve with weight loss.  While weight loss will take time, CPAP therapy could be initiated immediately.   I recommend an auto titration CPAP device with a setting between 5 and 16 cm water pressure, 2 cm EPR, heated humidification and an interface of patient's choice.  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  Sinus headaches an body pain are to be addressed by PCP.     INTERPRETING PHYSICIAN:   Larey Seat, MD   Medical Director of [facility] at [facility].

## 2022-08-29 NOTE — Procedures (Signed)
[facility] [place] TEST REPORT ( by [facility] PAT) mail out  STUDY DATA:  available as of 08-23-22 DOB:  10-25-1975    ORDERING CLINICIAN: Larey Seat, MD  REFERRING CLINICIAN: Su Monks, MD    CLINICAL INFORMATION/HISTORY: Transient aphasia. Went to hospital on 05/09/22 to r/o stroke. Cleared. Had EEG - negative, MRI and EEG here to review- neuro consult . Here to f/u with neurology. Possibly migraines? Lack of sleep? (Works night shift). Getting 3-4 migraines per week. Pain/severity depends on migraine episode. Has nausea. Sometimes has L arm numbness.  Does snore while sleeping.   Epworth sleepiness score: 12/24. FSS at na/ 63 points.    BMI: 41.3 kg/m   Neck Circumference: 18"   FINDINGS:   Sleep Summary:   Total Recording Time (hours, min):  10 hours  Total Sleep Time (hours, min):    7 hours 23 minutes             Percent REM (%):    35%   Sleep latency was 18 minutes, REM sleep latency 25 minutes there were 18 periods of wakefulness.                                   Respiratory Indices:   Calculated pAHI (per hour): 16.4/h                            REM pAHI:   29/h                                              NREM pAHI:   9.8/h                           Positional AHI: The patient slept 220 minutes in supine position associated with an AHI of 24.4/h and 224 minutes in nonsupine with an AHI of 8.7/h.  Breaking down the nonsupine sleep positions he did best when sleeping on his left with an AHI of 1.1/h and second on his right with an AHI of 9.7/h.  Snoring level reached a mean volume of 41 dB and was present for about 12% of total sleep time.                                                    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  There was no significant hypoxemia seen during sleep and snoring was mild to moderate.  My hope is still that treatment of sleep apnea may also improve the patient's migraine condition.  RECOMMENDATION: REM sleep dependent sleep apnea responds best to positive airway pressure therapy and will also improve with weight loss.  While weight loss will take time, CPAP therapy could be initiated immediately.   I recommend an auto titration CPAP device with a setting between 5 and 16 cm water pressure, 2 cm EPR, heated humidification and an interface of patient's choice.  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  Sinus headaches an body pain are to be addressed by PCP.     INTERPRETING PHYSICIAN:   Larey Seat, MD   Medical Director of [facility] at [facility].

## 2022-08-29 NOTE — Addendum Note (Signed)
Addended by: Larey Seat on: 08/29/2022 05:19 PM   Modules accepted: Orders

## 2022-08-31 ENCOUNTER — Telehealth: Payer: Self-pay | Admitting: *Deleted

## 2022-08-31 ENCOUNTER — Telehealth: Payer: Self-pay

## 2022-08-31 NOTE — Telephone Encounter (Signed)
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  There was no significant hypoxemia seen during sleep and snoring was mild to moderate.  My hope is still that treatment of sleep apnea may also improve the patient's migraine condition.   RECOMMENDATION: REM sleep dependent sleep apnea responds best to positive airway pressure therapy and will also improve with weight loss.  While weight loss will take time, CPAP therapy could be initiated immediately.   I recommend an auto titration CPAP device with a setting between 5 and 16 cm water pressure, 2 cm EPR, heated humidification and an interface of patient's choice.   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   Sinus headaches and leg/ body pain are to be addressed by PCP.

## 2022-08-31 NOTE — Telephone Encounter (Signed)
LVM for pt to call to go over sleep study results.

## 2022-09-05 NOTE — Telephone Encounter (Signed)
I called pt. I advised pt that Dr. Brett Fairy reviewed their sleep study results and found that pt has OSA. Dr. Brett Fairy recommends that pt start. I reviewed PAP compliance expectations with the pt. Pt is agreeable to starting a CPAP. I advised pt that an order will be sent to a DME, [facility], and [facility] will call the pt within about one week after they file with the pt's insurance. [facility] will show the pt how to use the machine, fit for masks, and troubleshoot the CPAP if needed.   Pt verbalized understanding of results. Pt had no questions at this time but was encouraged to call back if questions arise. I have sent the order to [facility] and have received confirmation that they have received the order. Provided pt phone# to [facility] to call in case he does not receive call from them.  Pt aware phone staff will reach out to schedule initial cpap f/u.

## 2022-09-05 NOTE — Telephone Encounter (Signed)
Pt has been scheduled for Initial Cpap in the middle of Feb. with NP.

## 2022-09-05 NOTE — Telephone Encounter (Signed)
Sent community message to [facility] to cx order. Sent to [facility] instead.

## 2022-09-15 LAB — BASIC METABOLIC PANEL
BUN: 16 (ref 4–21)
CO2: 28 — AB (ref 13–22)
Chloride: 103 (ref 99–108)
Creatinine: 1.5 — AB (ref 0.6–1.3)
Glucose: 81
Potassium: 4.6 mEq/L (ref 3.5–5.1)
Sodium: 139 (ref 137–147)

## 2022-09-15 LAB — COMPREHENSIVE METABOLIC PANEL
Albumin: 4 (ref 3.5–5.0)
Calcium: 8.8 (ref 8.7–10.7)
eGFR: 60

## 2022-09-15 LAB — PROTEIN / CREATININE RATIO, URINE: Creatinine, Urine: 99.1

## 2022-09-23 ENCOUNTER — Encounter: Payer: Self-pay | Admitting: Family Medicine

## 2022-09-23 NOTE — Progress Notes (Signed)
Office Visit Note  Patient: Francis Lin             Date of Birth: 1975-11-19           MRN: [id number]             PCP: [name], MD Referring: [name], MD Visit Date: 10/07/2022 Occupation: _0 @  Subjective:  Recent gout flare   History of Present Illness: Francis Lin is a 46 y.o. male with history of chronic gout arthropathy and osteoarthritis.  Patient reports that he was evaluated in the emergency department on 10/02/2022 and at which time he was diagnosed with C. difficile.  He was started on a prescription of vancomycin.  He states that he continues to have some diarrhea but has had less frequent and less severe bouts since initiating treatment.  He states for the past week he has not been taking his Uloric and colchicine as prescribed due to the severity of his diarrhea and nausea.  He states that he recently had a flare in the left wrist, left heel, and right great toe.  He states that the swelling and inflammation in his left wrist and left great toe have gradually started to improve.  He states that his arthralgias typically worsen with weather changes. He states he had an appointment at [facility] last week at which time he had updated lab work.  According to the patient his uric acid was 7.4 at that time.  He will be following back up with his nephrologist in 3 months. He states since his last office visit he typically takes colchicine half tablet morning and half tab in the evening about 5 days a week and Uloric half tablet about 3 days a week.     Activities of Daily Living:  Patient reports morning stiffness for Reports 20 minutes.   Patient Denies nocturnal pain.  Difficulty dressing/grooming: Denies Difficulty climbing stairs: Denies Difficulty getting out of chair: Denies Difficulty using hands for taps, buttons, cutlery, and/or writing: Denies  Review of Systems  Constitutional:  Positive for fatigue. Negative for night  sweats.  HENT:  Negative for mouth sores, mouth dryness and nose dryness.   Eyes:  Positive for dryness. Negative for redness.  Respiratory:  Negative for shortness of breath and difficulty breathing.   Cardiovascular:  Negative for chest pain, palpitations, hypertension, irregular heartbeat and swelling in legs/feet.  Gastrointestinal:  Positive for constipation and diarrhea. Negative for blood in stool.  Endocrine: Negative for increased urination.  Genitourinary:  Positive for urgency. Negative for involuntary urination.  Musculoskeletal:  Positive for joint pain, joint pain, joint swelling, muscle weakness and morning stiffness. Negative for gait problem, myalgias, muscle tenderness and myalgias.  Skin:  Positive for rash. Negative for color change, hair loss, nodules/bumps, skin tightness, ulcers and sensitivity to sunlight.  Allergic/Immunologic: Negative for susceptible to infections.  Neurological:  Positive for headaches and weakness. Negative for dizziness, fainting, memory loss and night sweats.  Hematological: Negative.  Negative for swollen glands.  Psychiatric/Behavioral: Negative.  Negative for depressed mood and sleep disturbance. The patient is not nervous/anxious.     PMFS History:  Patient Active Problem List   Diagnosis Date Noted   OSA (obstructive sleep apnea) 06/20/2022   Episodic cluster headache, not intractable 06/20/2022   Sinus congestion 06/20/2022   Morbid obesity ([place]) 06/20/2022   Episode of change in speech    Aphasia 05/10/2022   LUQ abdominal pain 11/03/2020   Generalized abdominal  pain 11/03/2020   Altered bowel habits 11/03/2020   Nausea without vomiting 11/03/2020   Migraine without aura and without status migrainosus, not intractable 11/22/2018   Nonsustained ventricular tachycardia ([place]) 08/03/2018   Elevated serum creatinine 06/18/2018   Slow heart rate 06/06/2018   Shortness of breath 04/04/2018   Venous insufficiency 01/05/2017   Deep vein  thrombosis (DVT) of popliteal vein of right lower extremity ([place]) 06/30/2016   S/P ORIF (open reduction internal fixation) fracture 06/22/2016   Multiple fractures of both lower extremities 05/31/2016   Elevated CK 05/17/2016   Fracture of right fibula 05/17/2016   Fracture of right tibia 05/17/2016   Fracture of tibia, left, closed 05/17/2016   Trauma 05/17/2016   Situational anxiety 08/28/2014   Unspecified vitamin D deficiency 09/22/2013   Shifting sleep-work schedule, affecting sleep 06/20/2013   Snoring disorder 06/20/2013   Hypersomnia, persistent    History of esophageal stricture 04/16/2013   Pain in joint, ankle and foot 04/16/2013   Gout 04/16/2013   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   OBESITY, CLASS II, BMI 35- 39.9 03/15/2012   Hypertriglyceridemia 03/15/2012   Atypical chest pain 12/02/2008   Dysphagia 12/02/2008    Past Medical History:  Diagnosis Date   Acid reflux    Allergy    Anxiety    stress    Clotting disorder ([place])    right leg x 2 in calf and behind knee    DVT (deep venous thrombosis) (H[facility])    Esophagitis    Gout    Hypersomnia, persistent    IBS (irritable bowel syndrome)    PONV (postoperative nausea and vomiting)    nausea with no vomiting    Renal disorder    Shifting sleep-work schedule, affecting sleep 06/20/2013   Snoring disorder 06/20/2013   Tubular adenoma of colon     Family History  Problem Relation Age of Onset   Lupus Mother    Heart attack Father    Arthritis Father    Hypertension Father    Cancer Father        multiple myeloma   Diabetes Father    Atrial fibrillation Father    Gout Father    Kidney failure Father    Edema Sister    Diabetes Sister    Bipolar disorder Sister    Stroke Sister    Obesity Sister    Adrenal disorder Sister    Lupus Maternal Aunt    Eczema Daughter    ADD / ADHD Daughter    Asthma Daughter    Eczema Daughter    ADD / ADHD Daughter    Hypotension Daughter    Eczema Daughter     High Cholesterol Daughter    Colon cancer Neg Hx    Colon polyps Neg Hx    Esophageal cancer Neg Hx    Rectal cancer Neg Hx    Stomach cancer Neg Hx    Past Surgical History:  Procedure Laterality Date   COLONOSCOPY     ESOPHAGEAL DILATION     HEMORRHOID BANDING     ORIF TIBIA & FIBULA FRACTURES Right    right leg surgery x 2 places- rod in place knee to ankle    UPPER GASTROINTESTINAL ENDOSCOPY     wisdom teeth     wrist surgery Right    Social History   Social History Narrative   Right handed   Caffeine use: tea sometimes   Immunization History  Administered Date(s) Administered   Influenza,inj,Quad PF,6+ Mos  07/18/2022   PFIZER(Purple Top)SARS-COV-2 Vaccination 12/19/2019, 01/09/2020, 11/09/2020   Tdap 08/19/2011, 05/17/2016     Objective: Vital Signs: BP 134/74 (BP Location: Left Arm, Patient Position: Sitting, Cuff Size: Large)   Pulse 72   Resp 16   Ht 6' 2.5" (1.892 m)   Wt (!) 328 lb (148.8 kg)   BMI 41.55 kg/m    Physical Exam Vitals and nursing note reviewed.  Constitutional:      Appearance: He is well-developed.  HENT:     Head: Normocephalic and atraumatic.  Eyes:     Conjunctiva/sclera: Conjunctivae normal.     Pupils: Pupils are equal, round, and reactive to light.  Cardiovascular:     Rate and Rhythm: Normal rate and regular rhythm.     Heart sounds: Normal heart sounds.  Pulmonary:     Effort: Pulmonary effort is normal.     Breath sounds: Normal breath sounds.  Abdominal:     General: Bowel sounds are normal.     Palpations: Abdomen is soft.  Musculoskeletal:     Cervical back: Normal range of motion and neck supple.  Skin:    General: Skin is warm and dry.     Capillary Refill: Capillary refill takes less than 2 seconds.     Comments: No psoriasis or fingernail pitting noted.  No tophi at this time.   Neurological:     Mental Status: He is alert and oriented to person, place, and time.  Psychiatric:        Behavior: Behavior  normal.      Musculoskeletal Exam: C-spine, thoracic spine, lumbar spine have good range of motion.  Shoulder joints and elbow joints have good range of motion.  Some tenderness and inflammation over the extensor tendon of the left wrist.  Limited extension of the left wrist joint noted.  No tenderness or synovitis over MCP or PIP joints.  Complete fist formation bilaterally.  Hip joints have good range of motion with no groin pain.  Knee joints have good range of motion with no warmth or effusion.  Inflammation at the insertion site of the left achilles tendon noted.  Tenderness over the right great toe.   CDAI Exam: CDAI Score: -- Patient Global: --; Provider Global: -- Swollen: --; Tender: -- Joint Exam 10/07/2022   No joint exam has been documented for this visit   There is currently no information documented on the homunculus. Go to the Rheumatology activity and complete the homunculus joint exam.  Investigation: No additional findings.  Imaging: CT Abdomen Pelvis W Contrast  Result Date: 10/02/2022 CLINICAL DATA:  Left lower quadrant pain and tenderness EXAM: CT ABDOMEN AND PELVIS WITH CONTRAST TECHNIQUE: Multidetector CT imaging of the abdomen and pelvis was performed using the standard protocol following bolus administration of intravenous contrast. RADIATION DOSE REDUCTION: This exam was performed according to the departmental dose-optimization program which includes automated exposure control, adjustment of the mA and/or kV according to patient size and/or use of iterative reconstruction technique. CONTRAST:  136m OMNIPAQUE IOHEXOL 300 MG/ML  SOLN COMPARISON:  09/17/2021 FINDINGS: Lower chest:  No contributory findings. Hepatobiliary: No focal liver abnormality.No evidence of biliary obstruction or stone. Pancreas: Unremarkable. Spleen: Unremarkable. Adrenals/Urinary Tract: Negative adrenals. No hydronephrosis or stone. Unremarkable bladder. Stomach/Bowel: Prominent proximal colonic  wall thickness could be from colitis although reportedly left-sided symptoms. No appendicitis or small bowel thickening. Vascular/Lymphatic: No acute vascular abnormality. No mass or adenopathy. Reproductive:No pathologic findings. Other: Chronic expansion and fat haziness in the small bowel  mesentery, present for multiple years. No underlying adenopathy or mass. Musculoskeletal: No acute abnormalities. IMPRESSION: 1. Equivocal for proximal colonic wall thickening/colitis. 2. Chronic/remote mesenteric panniculitis, present for multiple years. Electronically Signed   By: Jorje Guild M.D.   On: 10/02/2022 05:34    Recent Labs: Lab Results  Component Value Date   WBC 8.6 10/02/2022   HGB 15.2 10/02/2022   PLT 267 10/02/2022   NA 136 10/02/2022   K 3.9 10/02/2022   CL 103 10/02/2022   CO2 28 10/02/2022   GLUCOSE 90 10/02/2022   BUN 19 10/02/2022   CREATININE 1.49 (H) 10/02/2022   BILITOT 0.8 10/02/2022   ALKPHOS 71 10/02/2022   AST 24 10/02/2022   ALT 29 10/02/2022   PROT 8.0 10/02/2022   ALBUMIN 4.0 10/02/2022   CALCIUM 8.7 (L) 10/02/2022   GFRAA 58 (L) 03/22/2020    Speciality Comments: Allopurinol-rash  Procedures:  No procedures performed Allergies: Peanuts [peanut oil], Lactose, Allopurinol, Caffeine, Gabapentin, Ibuprofen, Omeprazole, Penicillins, and Seasonal ic [cholestatin]   Assessment / Plan:     Visit Diagnoses: Chronic gouty arthropathy - History of frequent gout flares.  Not crystal proven. Uric acid: 8.1 on 08/02/2022.  Patient was last seen in the office on 08/02/2022 at which time he was advised to restart Uloric 40 mg and colchicine 0.6 mg 1 daily.  He has been taking Uloric 40 mg half tablet daily 3 days a week and colchicine 0.6 mg half tab in the morning and half tablet in the evening.  He had updated lab work last week at [facility].  According to the patient his uric acid was 7.4 at that time.  We will call to obtain the most recent lab results to review.   Of note the patient was diagnosed with C. difficile on 10/02/2022.  Prior to initiating vancomycin he was having about 10 bowel movements per day.  Last week he started to have symptoms of a flare in the right great toe, left wrist, and left heel.  On examination he continues to have some tenderness and inflammation in the extensor tendon of the left wrist as well as inflammation at the insertion site of the left Achilles tendon concerning for inflammatory arthritis.  He has no history of psoriasis.   Discussed the importance of remaining compliant taking Uloric and colchicine as prescribed.  If he continues to have recurrent arthralgias and inflammation I would recommend following up sooner for further evaluation to rule out inflammatory arthritis.  He will follow-up in the office in 2 months or sooner if needed. - Plan: colchicine 0.6 MG tablet  Primary osteoarthritis of both hands - X-rays were consistent with osteoarthritis on 11/02/2021. He has limited extension of the left wrist on examination today.  Mild warmth and inflammation in the extensor tendon of the left wrist noted raising the concern for inflammatory arthritis.  No history of psoriasis.   No SI joint tenderness.  He was advised to remain on colchicine and Uloric as prescribed.  If he continues to have persistent joint pain and inflammation I advised following back up for further evaluation to rule out inflammatory arthritis. He will follow up in the office in 2 months or sooner if needed.   Primary osteoarthritis of both knees - Bilateral moderate osteoarthritis and mild chondromalacia patella.  No warmth or effusion on exam.    Pain in both feet - History of intermittent swelling.  He has intermittent right great toe pain.  Inflammation at the achilles  tendon insertion site on the left foot noted.    Other medical conditions are listed as follows:   Shortness of breath - Groundglass attenuation noted on the CT scan.  He is  followed by Dr. Vaughan Browner.  Multiple pulmonary nodules - ACE elevated at 126.  According to Dr. Matilde Bash note his CT findings were not consistent with sarcoidosis.  S/P ORIF (open reduction internal fixation) fracture - Crush injury 2017.  History of fibula fracture  History of fracture of tibia  Elevated serum creatinine - History of chronic use of NSAIDs. Referred to nephrology.  History of Clostridioides difficile colitis: Evaluated in the ED on 10/02/2022.  Diagnosed with C. difficile and given a prescription for vancomycin.  Patient plans on following up with her PCP.  Rash - History of recurrent rash in the axillary and inguinal region.  Venous insufficiency  Nonsustained ventricular tachycardia (HCC)  Hypertriglyceridemia  History of DVT (deep vein thrombosis) - In 2017 with crush injury  History of esophageal stricture  Situational anxiety  Migraine without aura and without status migrainosus, not intractable  Hypersomnia, persistent  Class 3 severe obesity with body mass index (BMI) of 40.0 to 44.9 in adult, unspecified obesity type, unspecified whether serious comorbidity present ([place])   Orders: No orders of the defined types were placed in this encounter.  Meds ordered this encounter  Medications   febuxostat (ULORIC) 40 MG tablet    Sig: Take 1 tablet (40 mg total) by mouth daily.    Dispense:  30 tablet    Refill:  2   colchicine 0.6 MG tablet    Sig: Take 0.6 mg 1 tablet by mouth daily.    Dispense:  90 tablet    Refill:  0     Follow-Up Instructions: Return in about 2 months (around 12/08/2022) for Osteoarthritis, Gout.   Ofilia Neas, PA-C  Note - This record has been created using Dragon software.  Chart creation errors have been sought, but may not always  have been located. Such creation errors do not reflect on  the standard of medical care.

## 2022-10-02 ENCOUNTER — Telehealth (HOSPITAL_BASED_OUTPATIENT_CLINIC_OR_DEPARTMENT_OTHER): Payer: Self-pay | Admitting: Emergency Medicine

## 2022-10-02 ENCOUNTER — Emergency Department (HOSPITAL_BASED_OUTPATIENT_CLINIC_OR_DEPARTMENT_OTHER)
Admission: EM | Admit: 2022-10-02 | Discharge: 2022-10-02 | Disposition: A | Discharge status: Home / Self Care | Payer: Managed Care, Other (non HMO) | Attending: Emergency Medicine | Admitting: Emergency Medicine

## 2022-10-02 ENCOUNTER — Emergency Department (HOSPITAL_BASED_OUTPATIENT_CLINIC_OR_DEPARTMENT_OTHER): Payer: Managed Care, Other (non HMO)

## 2022-10-02 ENCOUNTER — Encounter (HOSPITAL_BASED_OUTPATIENT_CLINIC_OR_DEPARTMENT_OTHER): Payer: Self-pay | Admitting: Emergency Medicine

## 2022-10-02 ENCOUNTER — Other Ambulatory Visit: Payer: Self-pay

## 2022-10-02 DIAGNOSIS — R197 Diarrhea, unspecified: Secondary | ICD-10-CM | POA: Insufficient documentation

## 2022-10-02 DIAGNOSIS — R110 Nausea: Secondary | ICD-10-CM | POA: Diagnosis not present

## 2022-10-02 DIAGNOSIS — R1032 Left lower quadrant pain: Secondary | ICD-10-CM | POA: Diagnosis present

## 2022-10-02 HISTORY — DX: Disorder of kidney and ureter, unspecified: N28.9

## 2022-10-02 LAB — CBC
HCT: 45.8 % (ref 39.0–52.0)
Hemoglobin: 15.2 g/dL (ref 13.0–17.0)
MCH: 29.3 pg (ref 26.0–34.0)
MCHC: 33.2 g/dL (ref 30.0–36.0)
MCV: 88.4 fL (ref 80.0–100.0)
Platelets: 267 10*3/uL (ref 150–400)
RBC: 5.18 MIL/uL (ref 4.22–5.81)
RDW: 13.8 % (ref 11.5–15.5)
WBC: 8.6 10*3/uL (ref 4.0–10.5)
nRBC: 0 % (ref 0.0–0.2)

## 2022-10-02 LAB — GASTROINTESTINAL PANEL BY PCR, STOOL (REPLACES STOOL CULTURE)

## 2022-10-02 LAB — COMPREHENSIVE METABOLIC PANEL
ALT: 29 U/L (ref 0–44)
AST: 24 U/L (ref 15–41)
Albumin: 4 g/dL (ref 3.5–5.0)
Alkaline Phosphatase: 71 U/L (ref 38–126)
Anion gap: 5 (ref 5–15)
BUN: 19 mg/dL (ref 6–20)
CO2: 28 mmol/L (ref 22–32)
Calcium: 8.7 mg/dL — ABNORMAL LOW (ref 8.9–10.3)
Chloride: 103 mmol/L (ref 98–111)
Creatinine, Ser: 1.49 mg/dL — ABNORMAL HIGH (ref 0.61–1.24)
GFR, Estimated: 58 mL/min — ABNORMAL LOW (ref >60)
Glucose, Bld: 90 mg/dL (ref 70–99)
Potassium: 3.9 mmol/L (ref 3.5–5.1)
Sodium: 136 mmol/L (ref 135–145)
Total Bilirubin: 0.8 mg/dL (ref 0.3–1.2)
Total Protein: 8 g/dL (ref 6.5–8.1)

## 2022-10-02 LAB — URINALYSIS, MICROSCOPIC (REFLEX)

## 2022-10-02 LAB — C DIFFICILE QUICK SCREEN W PCR REFLEX
C Diff antigen: POSITIVE — AB
C Diff interpretation: DETECTED
C Diff toxin: POSITIVE — AB

## 2022-10-02 LAB — URINALYSIS, ROUTINE W REFLEX MICROSCOPIC
Bilirubin Urine: NEGATIVE
Glucose, UA: NEGATIVE mg/dL
Ketones, ur: NEGATIVE mg/dL
Leukocytes,Ua: NEGATIVE
Nitrite: NEGATIVE
Protein, ur: 100 mg/dL — AB
Specific Gravity, Urine: 1.025 (ref 1.005–1.030)
pH: 5.5 (ref 5.0–8.0)

## 2022-10-02 LAB — LIPASE, BLOOD: Lipase: 28 U/L (ref 11–51)

## 2022-10-02 MED ORDER — IOHEXOL 300 MG/ML  SOLN
100.0000 mL | Freq: Once | INTRAMUSCULAR | Status: AC | PRN
  Administered 2022-10-02: 100 mL via INTRAVENOUS

## 2022-10-02 MED ORDER — DICYCLOMINE HCL 20 MG PO TABS: 20.0000 mg | ORAL_TABLET | Freq: Two times a day (BID) | ORAL | 0 refills | Status: DC | PRN

## 2022-10-02 MED ORDER — DIPHENOXYLATE-ATROPINE 2.5-0.025 MG PO TABS: 1.0000 | ORAL_TABLET | Freq: Four times a day (QID) | ORAL | 0 refills | Status: DC | PRN

## 2022-10-02 NOTE — ED Provider Notes (Signed)
Patient with valuated for diarrhea illness.  Patient without a significant leukocytosis.  Creatinine is 1.49.  Send not meeting severe criteria for C. difficile.  Patient C. difficile screening was positive for antigen and toxin.  Will have them call in a prescription to treat him with vancomycin.  Patient was recently on antibiotics so was set up for C. difficile.  Based on his labs would categorize him as nonsevere so vancomycin her 25 mg p.o. 4 times daily for 10 days.  And then follow-up with his provider.   Fredia Sorrow, MD 10/02/22 [phone]

## 2022-10-02 NOTE — ED Triage Notes (Addendum)
Pt reports diarrhea since Monday, with LLQ pain and tenderness. Pt reports taking imodium with minimal relief. Pt unsure of fevers, but does report chills and "hot flashes". Recent tx with Clindamycin (2 weeks ago). Before taking Imodium pt reports 8-10 episodes of diarrhea per day, "maybe more". Reports nausea but denies vomiting. He states he's having "terrible rectal pain".

## 2022-10-02 NOTE — ED Notes (Signed)
Still need to obtain a urine sample, pt is aware that one is needed.

## 2022-10-02 NOTE — ED Provider Notes (Signed)
[facility]  Provider Note  CSN: [id number] Arrival date & time: 10/02/22 0334  History Chief Complaint  Patient presents with   Abdominal Pain    Francis Lin is a 46 y.o. male with history of IBS reports 4-5 days of intermittent sharp LLQ abdominal pain and loose stools/diarrhea. No fever, some nausea but no vomiting. No blood in stool. He was on a course of clindamycin 3-4 weeks ago for dental infection but did not have any symptoms immediately after completing that course so unsure if that is related.    Home Medications Prior to Admission medications   Medication Sig Start Date End Date Taking? Authorizing Provider  dicyclomine (BENTYL) 20 MG tablet Take 1 tablet (20 mg total) by mouth 2 (two) times daily as needed (abdominal cramping). 10/02/22  Yes Truddie Hidden, MD  diphenoxylate-atropine (LOMOTIL) 2.5-0.025 MG tablet Take 1 tablet by mouth 4 (four) times daily as needed for diarrhea or loose stools. 10/02/22  Yes Truddie Hidden, MD  acetaminophen (TYLENOL) 325 MG tablet Take 650 mg by mouth every 6 (six) hours as needed for mild pain.    [provider]  colchicine 0.6 MG tablet Take 0.6 mg 1 tablet by mouth daily. 08/01/22   Ofilia Neas, PA-C  cyanocobalamin 1000 MCG tablet Take 1 tablet (1,000 mcg total) by mouth daily. Patient not taking: Reported on 08/02/2022 05/13/22   Cherene Altes, MD  cyclobenzaprine (FLEXERIL) 10 MG tablet Take 1 tablet (10 mg total) by mouth 2 (two) times daily as needed for muscle spasms. 06/01/21   Lucrezia Starch, MD  febuxostat (ULORIC) 40 MG tablet Take 1 tablet (40 mg total) by mouth daily. 08/03/22   Ofilia Neas, PA-C  fluocinolone (VANOS) 0.01 % cream Apply 1 Application topically 2 (two) times daily as needed (Itching).    [provider]  fluticasone (FLONASE) 50 MCG/ACT nasal spray Place 1 spray into both nostrils daily as needed for allergies or rhinitis.     [provider]  loratadine (CLARITIN) 10 MG tablet Take 10 mg by mouth daily as needed for allergies.    [provider]  nystatin cream (MYCOSTATIN) Apply 1 Application topically 2 (two) times daily as needed for dry skin (Itching).    [provider]  topiramate (TOPAMAX) 25 MG tablet Take 1 tablet (25 mg total) by mouth 2 (two) times daily. 06/20/22   Dohmeier, Asencion Partridge, MD  cimetidine (TAGAMET) 200 MG tablet Take by mouth. 01/30/19 01/16/20  [provider]     Allergies    Peanuts [peanut oil], Lactose, Allopurinol, Caffeine, Gabapentin, Ibuprofen, Omeprazole, Penicillins, and Seasonal ic [cholestatin]   Review of Systems   Review of Systems Please see HPI for pertinent positives and negatives  Physical Exam BP 132/87 (BP Location: Right Arm)   Pulse 83   Temp 98.1 F (36.7 C)   Resp 20   Ht 6' 2.5" (1.892 m)   Wt (!) 149.7 kg   SpO2 100%   BMI 41.80 kg/m   Physical Exam Vitals and nursing note reviewed.  Constitutional:      Appearance: Normal appearance.  HENT:     Head: Normocephalic and atraumatic.     Nose: Nose normal.     Mouth/Throat:     Mouth: Mucous membranes are moist.  Eyes:     Extraocular Movements: Extraocular movements intact.     Conjunctiva/sclera: Conjunctivae normal.  Cardiovascular:     Rate and Rhythm: Normal  rate.  Pulmonary:     Effort: Pulmonary effort is normal.     Breath sounds: Normal breath sounds.  Abdominal:     General: Abdomen is flat.     Palpations: Abdomen is soft.     Tenderness: There is abdominal tenderness in the left lower quadrant. There is no guarding. Negative signs include Murphy's sign and McBurney's sign.  Musculoskeletal:        General: No swelling. Normal range of motion.     Cervical back: Neck supple.  Skin:    General: Skin is warm and dry.  Neurological:     General: No focal deficit present.     Mental Status: He is alert.  Psychiatric:        Mood and Affect: Mood  normal.     ED Results / Procedures / Treatments   EKG None  Procedures Procedures  Medications Ordered in the ED Medications  iohexol (OMNIPAQUE) 300 MG/ML solution 100 mL (100 mLs Intravenous Contrast Given 10/02/22 0511)    Initial Impression and Plan  Patient here with LLQ abdominal pain, diarrhea after recent Abx, there was a relatively long period of no symptoms after completing Clindamycin so not cleared related to today's symptom. Will check labs, send for CT. Stool sample sent to lab.   ED Course   Clinical Course as of 10/02/22 4287  [facility] Oct 02, 2022  0443 CBC is normal.  [CS]  6811 CMP with CKD at baseline, otherwise normal.  [CS]  0529 UA is neg for infection. [CS]  [id number] I personally viewed the images from radiology studies and agree with radiologist interpretation: CT neg for significant colitis or diverticulitis.  [CS]  [phone] Patient is resting comfortably, sleeping soundly in no distress. Lab and imaging results are reassuring. Stool studies not likely to result during ED stay but unlikely to be c-diff given negative ED tests thus far. Recommend he continue with symptomatic and supportive care at home. Rx for bentyl and lomotil as imodium does not seem to be effective. PCP follow up or RTED for any other concerns.  [CS]    Clinical Course User Index [CS] Truddie Hidden, MD     MDM Rules/Calculators/A&P Medical Decision Making Given presenting complaint, I considered that admission might be necessary. After review of results from ED lab and/or imaging studies, admission to the hospital is not indicated at this time.    Problems Addressed: Diarrhea of presumed infectious origin: acute illness or injury Left lower quadrant abdominal pain: acute illness or injury  Amount and/or Complexity of Data Reviewed Labs: ordered. Decision-making details documented in ED Course. Radiology: ordered and independent interpretation performed. Decision-making details  documented in ED Course.  Risk Prescription drug management. Decision regarding hospitalization.    Final Clinical Impression(s) / ED Diagnoses Final diagnoses:  Left lower quadrant abdominal pain  Diarrhea of presumed infectious origin    Rx / DC Orders ED Discharge Orders          Ordered    diphenoxylate-atropine (LOMOTIL) 2.5-0.025 MG tablet  4 times daily PRN        10/02/22 0641    dicyclomine (BENTYL) 20 MG tablet  2 times daily PRN        10/02/22 0641             Truddie Hidden, MD 10/02/22 [phone]

## 2022-10-07 ENCOUNTER — Ambulatory Visit: Payer: Managed Care, Other (non HMO) | Attending: Physician Assistant | Admitting: Physician Assistant

## 2022-10-07 ENCOUNTER — Encounter: Payer: Self-pay | Admitting: Physician Assistant

## 2022-10-07 VITALS — BP 134/74 | HR 72 | Resp 16 | Ht 74.5 in | Wt 328.0 lb

## 2022-10-07 DIAGNOSIS — M79671 Pain in right foot: Secondary | ICD-10-CM

## 2022-10-07 DIAGNOSIS — Z8781 Personal history of (healed) traumatic fracture: Secondary | ICD-10-CM

## 2022-10-07 DIAGNOSIS — G43009 Migraine without aura, not intractable, without status migrainosus: Secondary | ICD-10-CM

## 2022-10-07 DIAGNOSIS — M19041 Primary osteoarthritis, right hand: Secondary | ICD-10-CM | POA: Diagnosis not present

## 2022-10-07 DIAGNOSIS — M1A00X0 Idiopathic chronic gout, unspecified site, without tophus (tophi): Secondary | ICD-10-CM

## 2022-10-07 DIAGNOSIS — M170 Bilateral primary osteoarthritis of knee: Secondary | ICD-10-CM

## 2022-10-07 DIAGNOSIS — Z9889 Other specified postprocedural states: Secondary | ICD-10-CM

## 2022-10-07 DIAGNOSIS — G4710 Hypersomnia, unspecified: Secondary | ICD-10-CM

## 2022-10-07 DIAGNOSIS — M1A9XX0 Chronic gout, unspecified, without tophus (tophi): Secondary | ICD-10-CM

## 2022-10-07 DIAGNOSIS — R21 Rash and other nonspecific skin eruption: Secondary | ICD-10-CM

## 2022-10-07 DIAGNOSIS — R0602 Shortness of breath: Secondary | ICD-10-CM

## 2022-10-07 DIAGNOSIS — R918 Other nonspecific abnormal finding of lung field: Secondary | ICD-10-CM

## 2022-10-07 DIAGNOSIS — M19042 Primary osteoarthritis, left hand: Secondary | ICD-10-CM

## 2022-10-07 DIAGNOSIS — M79672 Pain in left foot: Secondary | ICD-10-CM

## 2022-10-07 DIAGNOSIS — Z6841 Body Mass Index (BMI) 40.0 and over, adult: Secondary | ICD-10-CM

## 2022-10-07 DIAGNOSIS — Z8619 Personal history of other infectious and parasitic diseases: Secondary | ICD-10-CM

## 2022-10-07 DIAGNOSIS — I4729 Other ventricular tachycardia: Secondary | ICD-10-CM

## 2022-10-07 DIAGNOSIS — R7989 Other specified abnormal findings of blood chemistry: Secondary | ICD-10-CM

## 2022-10-07 DIAGNOSIS — F418 Other specified anxiety disorders: Secondary | ICD-10-CM

## 2022-10-07 DIAGNOSIS — E66813 Obesity, class 3: Secondary | ICD-10-CM

## 2022-10-07 DIAGNOSIS — Z86718 Personal history of other venous thrombosis and embolism: Secondary | ICD-10-CM

## 2022-10-07 DIAGNOSIS — Z8719 Personal history of other diseases of the digestive system: Secondary | ICD-10-CM

## 2022-10-07 DIAGNOSIS — I872 Venous insufficiency (chronic) (peripheral): Secondary | ICD-10-CM

## 2022-10-07 DIAGNOSIS — E781 Pure hyperglyceridemia: Secondary | ICD-10-CM

## 2022-10-07 MED ORDER — FEBUXOSTAT 40 MG PO TABS: 40.0000 mg | ORAL_TABLET | Freq: Every day | ORAL | 2 refills | Status: AC

## 2022-10-07 MED ORDER — COLCHICINE 0.6 MG PO TABS: ORAL_TABLET | ORAL | 0 refills | Status: AC

## 2022-10-07 NOTE — Patient Instructions (Signed)

## 2022-10-08 LAB — VITAMIN B12: Vitamin B-12: 578 pg/mL (ref 232–1245)

## 2022-10-11 ENCOUNTER — Encounter: Payer: Self-pay | Admitting: Family Medicine

## 2022-10-12 LAB — ETHANOL: Ethanol: <0.01 %

## 2022-10-14 ENCOUNTER — Ambulatory Visit (INDEPENDENT_AMBULATORY_CARE_PROVIDER_SITE_OTHER): Payer: Managed Care, Other (non HMO) | Admitting: Family Medicine

## 2022-10-14 ENCOUNTER — Encounter: Payer: Self-pay | Admitting: Family Medicine

## 2022-10-14 VITALS — BP 118/70 | HR 76 | Temp 97.7°F | Ht 74.5 in | Wt 326.3 lb

## 2022-10-14 DIAGNOSIS — A0472 Enterocolitis due to Clostridium difficile, not specified as recurrent: Secondary | ICD-10-CM | POA: Diagnosis not present

## 2022-10-14 NOTE — Patient Instructions (Signed)
Please let me know if you have any recurrent diarrhea, fever, or bloody stools  Consider OTC probiotic such as Activia.

## 2022-10-14 NOTE — Progress Notes (Unsigned)
Established Patient Office Visit  Subjective   Patient ID: Francis Lin, male    DOB: 06-Nov-1975  Age: 47 y.o. MRN: [id number]  Chief Complaint  Patient presents with   Hospitalization Follow-up    HPI  {History (Optional):23778} Francis Lin is seen for follow-up regarding recently diagnosed C. difficile colitis.  He states around mid November he was treated by dentist with clindamycin.  He did not develop diarrhea symptoms until sometime around mid December.  He had diarrhea, low-grade fever, abdominal cramping when he went to the ER on December 24.  C. difficile came back positive and he was started on vancomycin 125 mg 4 times daily.  He has about 2 more days of medication left.  His diarrhea has resolved.  He still has some intermittent abdominal cramping but no fever.  Appetite fair.  No prior history of known C. difficile.  Past Medical History:  Diagnosis Date   Acid reflux    Allergy    Anxiety    stress    Clotting disorder (HCC)    right leg x 2 in calf and behind knee    DVT (deep venous thrombosis) (HCC)    Esophagitis    Gout    Hypersomnia, persistent    IBS (irritable bowel syndrome)    PONV (postoperative nausea and vomiting)    nausea with no vomiting    Renal disorder    Shifting sleep-work schedule, affecting sleep 06/20/2013   Snoring disorder 06/20/2013   Tubular adenoma of colon    Past Surgical History:  Procedure Laterality Date   COLONOSCOPY     ESOPHAGEAL DILATION     HEMORRHOID BANDING     ORIF TIBIA & FIBULA FRACTURES Right    right leg surgery x 2 places- rod in place knee to ankle    UPPER GASTROINTESTINAL ENDOSCOPY     wisdom teeth     wrist surgery Right     reports that he has never smoked. He has never used smokeless tobacco. He reports that he does not drink alcohol and does not use drugs. family history includes ADD / ADHD in his daughter and daughter; Adrenal disorder in his sister; Arthritis in his father; Asthma in his daughter;  Atrial fibrillation in his father; Bipolar disorder in his sister; Cancer in his father; Diabetes in his father and sister; Eczema in his daughter, daughter, and daughter; Edema in his sister; Gout in his father; Heart attack in his father; High Cholesterol in his daughter; Hypertension in his father; Hypotension in his daughter; Kidney failure in his father; Lupus in his maternal aunt and mother; Obesity in his sister; Stroke in his sister. Allergies  Allergen Reactions   Peanuts [Peanut Oil] Anaphylaxis   Lactose Diarrhea and Other (See Comments)    Lactose intolerance Lactose intolerance Lactose intolerance   Allopurinol     rash   Caffeine Other (See Comments)    "makes feel bad", headaches   Gabapentin Other (See Comments)    Messes with memory   Ibuprofen Other (See Comments)    Early onset of kidney issues (Stage 3A)   Omeprazole Other (See Comments)    Mood swing   Penicillins Other (See Comments)    Headaches    Seasonal Ic [Cholestatin]     Sneezing,runny nose , itchy eyes    Review of Systems  Constitutional:  Negative for chills and fever.  Gastrointestinal:  Negative for diarrhea, nausea and vomiting.  Genitourinary:  Negative for dysuria.  Objective:     BP 118/70 (BP Location: Left Arm, Patient Position: Sitting, Cuff Size: Large)   Pulse 76   Temp 97.7 F (36.5 C) (Oral)   Ht 6' 2.5" (1.892 m)   Wt (!) 326 lb 4.8 oz (148 kg)   SpO2 98%   BMI 41.33 kg/m  {Vitals History (Optional):23777}  Physical Exam Vitals reviewed.  Constitutional:      General: He is not in acute distress.    Appearance: He is not ill-appearing.  Cardiovascular:     Rate and Rhythm: Normal rate and regular rhythm.  Abdominal:     General: There is no distension.     Palpations: Abdomen is soft.     Tenderness: There is no abdominal tenderness. There is no guarding or rebound.  Neurological:     Mental Status: He is alert.      No results found for any visits on  10/14/22.  {Labs (Optional):23779}  The ASCVD Risk score (Arnett DK, et al., 2019) failed to calculate for the following reasons:   The valid total cholesterol range is 130 to 320 mg/dL    Assessment & Plan:   Recent C. difficile colitis.  Patient finishing up course of vancomycin.  He had relatively mild disease with normal CBC and other labs were relatively normal at ER visit.  His symptoms are improving though he still has some abdominal cramping.  -Finish out the vancomycin -Follow-up immediately for any recurrent diarrhea, bloody stools, fever -Consider over-the-counter probiotic such as Levy Sjogren, MD

## 2022-10-26 ENCOUNTER — Ambulatory Visit: Payer: Managed Care, Other (non HMO) | Admitting: Family Medicine

## 2022-10-28 ENCOUNTER — Ambulatory Visit: Payer: Managed Care, Other (non HMO) | Admitting: Adult Health

## 2022-10-28 ENCOUNTER — Other Ambulatory Visit: Payer: Managed Care, Other (non HMO)

## 2022-10-28 ENCOUNTER — Ambulatory Visit (INDEPENDENT_AMBULATORY_CARE_PROVIDER_SITE_OTHER): Payer: Managed Care, Other (non HMO) | Admitting: Adult Health

## 2022-10-28 ENCOUNTER — Telehealth: Payer: Self-pay

## 2022-10-28 VITALS — BP 130/80 | HR 70 | Temp 98.4°F | Ht 74.5 in | Wt 325.0 lb

## 2022-10-28 DIAGNOSIS — A0472 Enterocolitis due to Clostridium difficile, not specified as recurrent: Secondary | ICD-10-CM | POA: Diagnosis not present

## 2022-10-28 NOTE — Progress Notes (Signed)
Subjective:    Patient ID: Francis Lin, male    DOB: [birth year], 47 y.o.   MRN: [id number]  HPI  47 year old male who  has a past medical history of Acid reflux, Allergy, Anxiety, Clotting disorder ([place]), DVT (deep venous thrombosis) ([place]), Esophagitis, Gout, Hypersomnia, persistent, IBS (irritable bowel syndrome), PONV (postoperative nausea and vomiting), Renal disorder, Shifting sleep-work schedule, affecting sleep (06/20/2013), Snoring disorder (06/20/2013), and Tubular adenoma of colon.  In November he was treated by his dentist with clindamycin.  He did not develop diarrhea until sometime around mid December.  Was seen in the ER on December 24 for diarrhea, low-grade fever, abdominal cramping.  His C. difficile culture came back positive and he was started on vancomycin 125 mg 4 times daily.  He was seen by his PCP on 10/14/2022 at which time he had 2 days left of vancomycin.  At this time his diarrhea had resolved but was still having some intermittent abdominal cramping but no fever.  Not have a history of C. Difficile  Today he reports that he continues to have abdominal cramping and frequent bowel movements. When he does have a bowel movement sometimes he has diarrhea but not all the time.   + nausea but no vomiting  ? Fevers and chills.    Review of Systems See HPI   Past Medical History:  Diagnosis Date   Acid reflux    Allergy    Anxiety    stress    Clotting disorder (HCC)    right leg x 2 in calf and behind knee    DVT (deep venous thrombosis) (HCC)    Esophagitis    Gout    Hypersomnia, persistent    IBS (irritable bowel syndrome)    PONV (postoperative nausea and vomiting)    nausea with no vomiting    Renal disorder    Shifting sleep-work schedule, affecting sleep 06/20/2013   Snoring disorder 06/20/2013   Tubular adenoma of colon     Social History   Socioeconomic History   Marital status: Married    Spouse name: Not on file   Number of children: 3    Years of education: college   Highest education level: Not on file  Occupational History    Employer: [employer]    Comment: [employer]  Tobacco Use   Smoking status: Never   Smokeless tobacco: Never  Vaping Use   Vaping Use: Never used  Substance and Sexual Activity   Alcohol use: No   Drug use: No   Sexual activity: Not on file  Other Topics Concern   Not on file  Social History Narrative   Right handed   Caffeine use: tea sometimes   Social Determinants of Radio broadcast assistant Strain: Not on file  Food Insecurity: Not on file  Transportation Needs: Not on file  Physical Activity: Not on file  Stress: Not on file  Social Connections: Not on file  Intimate Partner Violence: Not on file    Past Surgical History:  Procedure Laterality Date   COLONOSCOPY     ESOPHAGEAL DILATION     HEMORRHOID BANDING     ORIF TIBIA & FIBULA FRACTURES Right    right leg surgery x 2 places- rod in place knee to ankle    UPPER GASTROINTESTINAL ENDOSCOPY     wisdom teeth     wrist surgery Right     Family History  Problem Relation Age of Onset  Lupus Mother    Heart attack Father    Arthritis Father    Hypertension Father    Cancer Father        multiple myeloma   Diabetes Father    Atrial fibrillation Father    Gout Father    Kidney failure Father    Edema Sister    Diabetes Sister    Bipolar disorder Sister    Stroke Sister    Obesity Sister    Adrenal disorder Sister    Lupus Maternal Aunt    Eczema Daughter    ADD / ADHD Daughter    Asthma Daughter    Eczema Daughter    ADD / ADHD Daughter    Hypotension Daughter    Eczema Daughter    High Cholesterol Daughter    Colon cancer Neg Hx    Colon polyps Neg Hx    Esophageal cancer Neg Hx    Rectal cancer Neg Hx    Stomach cancer Neg Hx     Allergies  Allergen Reactions   Peanuts [Peanut Oil] Anaphylaxis   Lactose Diarrhea and Other (See Comments)    Lactose intolerance Lactose  intolerance Lactose intolerance   Allopurinol     rash   Caffeine Other (See Comments)    "makes feel bad", headaches   Gabapentin Other (See Comments)    Messes with memory   Ibuprofen Other (See Comments)    Early onset of kidney issues (Stage 3A)   Omeprazole Other (See Comments)    Mood swing   Penicillins Other (See Comments)    Headaches    Seasonal Ic [Cholestatin]     Sneezing,runny nose , itchy eyes    Current Outpatient Medications on File Prior to Visit  Medication Sig Dispense Refill   acetaminophen (TYLENOL) 325 MG tablet Take 650 mg by mouth every 6 (six) hours as needed for mild pain.     colchicine 0.6 MG tablet Take 0.6 mg 1 tablet by mouth daily. 90 tablet 0   cyanocobalamin 1000 MCG tablet Take 1 tablet (1,000 mcg total) by mouth daily.     cyclobenzaprine (FLEXERIL) 10 MG tablet Take 1 tablet (10 mg total) by mouth 2 (two) times daily as needed for muscle spasms. 20 tablet 0   dicyclomine (BENTYL) 20 MG tablet Take 1 tablet (20 mg total) by mouth 2 (two) times daily as needed (abdominal cramping). 20 tablet 0   diphenoxylate-atropine (LOMOTIL) 2.5-0.025 MG tablet Take 1 tablet by mouth 4 (four) times daily as needed for diarrhea or loose stools. 30 tablet 0   febuxostat (ULORIC) 40 MG tablet Take 1 tablet (40 mg total) by mouth daily. 30 tablet 2   fluocinolone (VANOS) 0.01 % cream Apply 1 Application topically 2 (two) times daily as needed (Itching).     fluticasone (FLONASE) 50 MCG/ACT nasal spray Place 1 spray into both nostrils daily as needed for allergies or rhinitis.     loratadine (CLARITIN) 10 MG tablet Take 10 mg by mouth daily as needed for allergies.     nystatin cream (MYCOSTATIN) Apply 1 Application topically 2 (two) times daily as needed for dry skin (Itching).     topiramate (TOPAMAX) 25 MG tablet Take 1 tablet (25 mg total) by mouth 2 (two) times daily. 60 tablet 3   [DISCONTINUED] cimetidine (TAGAMET) 200 MG tablet Take by mouth.     No current  facility-administered medications on file prior to visit.    BP 130/80   Pulse 70  Temp 98.4 F (36.9 C) (Oral)   Ht 6' 2.5" (1.892 m)   Wt (!) 325 lb (147.4 kg)   SpO2 98%   BMI 41.17 kg/m       Objective:   Physical Exam Vitals and nursing note reviewed.  Abdominal:     General: Abdomen is flat. Bowel sounds are normal.     Palpations: Abdomen is soft.     Tenderness: There is abdominal tenderness in the right upper quadrant, epigastric area, left upper quadrant and left lower quadrant.  Skin:    General: Skin is warm and dry.  Neurological:     General: No focal deficit present.  Psychiatric:        Mood and Affect: Mood normal.        Behavior: Behavior normal.        Thought Content: Thought content normal.        Judgment: Judgment normal.       Assessment & Plan:  1. C. difficile diarrhea - Will retest due to symptoms  - Red flags reviewed - C. difficile, PCR([facility]); Future  [name], NP

## 2022-10-28 NOTE — Patient Instructions (Signed)
It was nice meeting you today   We will recheck a stool sample and follow up with you once we get the results back

## 2022-10-28 NOTE — Telephone Encounter (Signed)
---  Francis Lin states he completed antibiotics for C-diff and continues to have diarrhea (about 8 episodes of loose stool in the past 24 hours). He has blood with wiping 3-4 times in the past 24 hours due to skin irritation. No fever. He last passed urine about 5am. Alert and responsive.  10/27/2022 10:59:03 AM See PCP within 24 Hours [place], RN, [employer]  Referrals REFERRED TO PCP OFFICE  Pt has appt with [name] on 10/28/22

## 2022-10-29 LAB — CLOSTRIDIUM DIFFICILE BY PCR: Toxigenic C. Difficile by PCR: POSITIVE — AB

## 2022-10-31 ENCOUNTER — Telehealth: Payer: Self-pay | Admitting: Family Medicine

## 2022-10-31 NOTE — Telephone Encounter (Signed)
Results not yet viewed by [facility]

## 2022-10-31 NOTE — Telephone Encounter (Signed)
Pt saw NP on 10/28/22.  Pt is asking for a call back to go over lab results and plan of action.  Please call Pt back at [phone]

## 2022-11-01 ENCOUNTER — Telehealth: Payer: Self-pay | Admitting: Family Medicine

## 2022-11-01 ENCOUNTER — Other Ambulatory Visit: Payer: Self-pay | Admitting: Adult Health

## 2022-11-01 MED ORDER — VANCOMYCIN HCL 125 MG PO CAPS: 125.0000 mg | ORAL_CAPSULE | Freq: Four times a day (QID) | ORAL | 0 refills | Status: AC

## 2022-11-01 NOTE — Telephone Encounter (Signed)
Wants to speak with someone regarding treatment for his c diff. Wants to take a probiotic. Saw Nafziger 10/28/22

## 2022-11-02 NOTE — Telephone Encounter (Signed)
Patient notified of update  and verbalized understanding. 

## 2022-11-02 NOTE — Telephone Encounter (Signed)
Pt was given results yesterday with Cory's advise. Called pt to see what other questions he had. Pt stated that he was advised by Dr.Burchette that he needed to take a probiotic with the antibiotic. Pt wants to know if he can take the probiotic only instead of the antibiotic. Advised that I will send to [facility] for advise.

## 2022-11-02 NOTE — Telephone Encounter (Signed)
Tried to call pt and spouse regarding update no answer.

## 2022-11-24 NOTE — Progress Notes (Signed)
[facility] [address] [place]. [place] [address] [phone]       OFFICE FOLLOW UP NOTE  [name] Date of Birth:  November 22, 1975 Medical Record Number:  [id number]   Reason for visit: Initial CPAP follow-up    SUBJECTIVE:   CHIEF COMPLAINT:  Chief Complaint  Patient presents with   Obstructive Sleep Apnea    Rm 3 alone Pt is well and stable, no concerns with CPAP but reports increase in saliva occasionally since starting machine.    HPI:    Francis Lin is a 47 y.o. male who is followed for OSA.  Was seen by Dr. Brett Fairy 06/20/2022 for sleep evaluation with prior diagnosis of mild OSA in 2014 with complaints of snoring, daytime sleepiness and headaches associated with dizziness, blurred vision, photophobia, nausea and pressure behind eye.  Completed HST 08/03/2022 which showed mild to moderate OSA and recommend initiation of CPAP.  He was also started on topiramate at prior visit for migraine prophylaxis.  Interval history:   Reports tolerating CPAP well.  Initially using fullface mask but he did not tolerate well, now using nasal pillow with use of chin strap, tolerating better. Has noticed occasional increased saliva production with use of nasal pillow. Reports improvement of sleep quality and some improvement of daytime energy levels but still has some fatigue, believes this is more due to working 3rd shift. Improvement of headaches since starting CPAP, has not been taking topiramate as headaches improved.   Epworth Sleepiness Scale 9/24 (prior to CPAP 12/24)  Of note, blood pressure elevated today, asymptomatic.  He was encouraged to monitor at home and if remains elevated to follow-up with PCP and nephrologist for further recommendations.              ROS:   14 system review of systems performed and negative with exception of those listed in HPI  PMH:  Past Medical History:  Diagnosis Date   Acid reflux    Allergy     Anxiety    stress    Clotting disorder (HCC)    right leg x 2 in calf and behind knee    DVT (deep venous thrombosis) (HCC)    Esophagitis    Gout    Hypersomnia, persistent    IBS (irritable bowel syndrome)    PONV (postoperative nausea and vomiting)    nausea with no vomiting    Renal disorder    Shifting sleep-work schedule, affecting sleep 06/20/2013   Snoring disorder 06/20/2013   Tubular adenoma of colon     PSH:  Past Surgical History:  Procedure Laterality Date   COLONOSCOPY     ESOPHAGEAL DILATION     HEMORRHOID BANDING     ORIF TIBIA & FIBULA FRACTURES Right    right leg surgery x 2 places- rod in place knee to ankle    UPPER GASTROINTESTINAL ENDOSCOPY     wisdom teeth     wrist surgery Right     Social History:  Social History   Socioeconomic History   Marital status: Married    Spouse name: Not on file   Number of children: 3   Years of education: college   Highest education level: Not on file  Occupational History    Employer: [employer]    Comment: [employer]  Tobacco Use   Smoking status: Never   Smokeless tobacco: Never  Vaping Use   Vaping Use: Never used  Substance and Sexual Activity   Alcohol  use: No   Drug use: No   Sexual activity: Not on file  Other Topics Concern   Not on file  Social History Narrative   Right handed   Caffeine use: tea sometimes   Social Determinants of Health   Financial Resource Strain: Not on file  Food Insecurity: Not on file  Transportation Needs: Not on file  Physical Activity: Not on file  Stress: Not on file  Social Connections: Not on file  Intimate Partner Violence: Not on file    Family History:  Family History  Problem Relation Age of Onset   Lupus Mother    Heart attack Father    Arthritis Father    Hypertension Father    Cancer Father        multiple myeloma   Diabetes Father    Atrial fibrillation Father    Gout Father    Kidney failure Father    Edema Sister     Diabetes Sister    Bipolar disorder Sister    Stroke Sister    Obesity Sister    Adrenal disorder Sister    Lupus Maternal Aunt    Eczema Daughter    ADD / ADHD Daughter    Asthma Daughter    Eczema Daughter    ADD / ADHD Daughter    Hypotension Daughter    Eczema Daughter    High Cholesterol Daughter    Colon cancer Neg Hx    Colon polyps Neg Hx    Esophageal cancer Neg Hx    Rectal cancer Neg Hx    Stomach cancer Neg Hx     Medications:   Current Outpatient Medications on File Prior to Visit  Medication Sig Dispense Refill   acetaminophen (TYLENOL) 325 MG tablet Take 650 mg by mouth every 6 (six) hours as needed for mild pain.     colchicine 0.6 MG tablet Take 0.6 mg 1 tablet by mouth daily. 90 tablet 0   cyanocobalamin 1000 MCG tablet Take 1 tablet (1,000 mcg total) by mouth daily.     cyclobenzaprine (FLEXERIL) 10 MG tablet Take 1 tablet (10 mg total) by mouth 2 (two) times daily as needed for muscle spasms. 20 tablet 0   dicyclomine (BENTYL) 20 MG tablet Take 1 tablet (20 mg total) by mouth 2 (two) times daily as needed (abdominal cramping). (Patient taking differently: Take 20 mg by mouth as needed (abdominal cramping).) 20 tablet 0   diphenoxylate-atropine (LOMOTIL) 2.5-0.025 MG tablet Take 1 tablet by mouth 4 (four) times daily as needed for diarrhea or loose stools. 30 tablet 0   febuxostat (ULORIC) 40 MG tablet Take 1 tablet (40 mg total) by mouth daily. 30 tablet 2   fluocinolone (VANOS) 0.01 % cream Apply 1 Application topically 2 (two) times daily as needed (Itching).     fluticasone (FLONASE) 50 MCG/ACT nasal spray Place 1 spray into both nostrils daily as needed for allergies or rhinitis.     loratadine (CLARITIN) 10 MG tablet Take 10 mg by mouth daily as needed for allergies.     nystatin cream (MYCOSTATIN) Apply 1 Application topically 2 (two) times daily as needed for dry skin (Itching).     topiramate (TOPAMAX) 25 MG tablet Take 1 tablet (25 mg total) by mouth 2  (two) times daily. 60 tablet 3   [DISCONTINUED] cimetidine (TAGAMET) 200 MG tablet Take by mouth.     No current facility-administered medications on file prior to visit.    Allergies:   Allergies  Allergen Reactions   Peanuts [Peanut Oil] Anaphylaxis   Lactose Diarrhea and Other (See Comments)    Lactose intolerance Lactose intolerance Lactose intolerance   Allopurinol     rash   Caffeine Other (See Comments)    "makes feel bad", headaches   Gabapentin Other (See Comments)    Messes with memory   Ibuprofen Other (See Comments)    Early onset of kidney issues (Stage 3A)   Omeprazole Other (See Comments)    Mood swing   Penicillins Other (See Comments)    Headaches    Seasonal Ic [Cholestatin]     Sneezing,runny nose , itchy eyes      OBJECTIVE:  Physical Exam  Vitals:   11/28/22 1233 11/28/22 1238 11/28/22 1301 11/28/22 1302  BP: (!) 150/81 (!) 154/86 (!) 159/89 (!) 158/97  Pulse: 71 71 72 72  Weight: (!) 329 lb (149.2 kg)     Height: 6' 3"$  (1.905 m)      Body mass index is 41.12 kg/m. No results found.   General: well developed, well nourished, pleasant middle-aged African-American male, seated, in no evident distress Head: head normocephalic and atraumatic.   Neck: supple with no carotid or supraclavicular bruits Cardiovascular: regular rate and rhythm, no murmurs Musculoskeletal: no deformity Skin:  no rash/petichiae Vascular:  Normal pulses all extremities   Neurologic Exam Mental Status: Awake and fully alert. Oriented to place and time. Recent and remote memory intact. Attention span, concentration and fund of knowledge appropriate. Mood and affect appropriate.  Cranial Nerves: Pupils equal, briskly reactive to light. Extraocular movements full without nystagmus. Visual fields full to confrontation. Hearing intact. Facial sensation intact. Face, tongue, palate moves normally and symmetrically.  Motor: Normal bulk and tone. Normal strength in all  tested extremity muscles Sensory.: intact to touch , pinprick , position and vibratory sensation.  Coordination: Rapid alternating movements normal in all extremities. Finger-to-nose and heel-to-shin performed accurately bilaterally. Gait and Station: Arises from chair without difficulty. Stance is normal. Gait demonstrates normal stride length and balance without use of AD.  Reflexes: 1+ and symmetric. Toes downgoing.         ASSESSMENT/PLAN: Francis Lin is a 47 y.o. year old male    OSA on CPAP : Compliance report shows satisfactory usage with optimal residual AHI.  Continue current pressure settings.  Discussed increased nightly usage with ensuring greater than 4 hours nightly for optimal benefit and per insurance purposes.  Continue to follow with DME company for any needed supplies or CPAP related concerns    Follow up in 6 months or call earlier if needed   CC:  PCP: [name], MD    I spent 22 minutes of face-to-face and non-face-to-face time with patient.  This included previsit chart review, lab review, study review, order entry, electronic health record documentation, patient education regarding diagnosis of sleep apnea with review and discussion of compliance report and answered all other questions to patient's satisfaction   Frann Rider, [facility]  [facility] [address] [place], [place] [address]  Phone [phone] Fax [phone] Note: This document was prepared with digital dictation and possible smart phrase technology. Any transcriptional errors that result from this process are unintentional.

## 2022-11-24 NOTE — Progress Notes (Signed)
Office Visit Note  Patient: Francis Lin             Date of Birth: [birth year]           MRN: [id number]             PCP: [name], MD Referring: [name], MD Visit Date: 12/08/2022 Occupation: @GUAROCC$ @  Subjective:  Pain and swelling in the left ankle   History of Present Illness: Francis Lin is a 47 y.o. male with history of gouty arthropathy and osteoarthritis. On 10/02/2022 patient developed c. diff infection and paused his uloric and colchicine. He had not restarted the medications and then on 02/22 he developed a gout flare in his left ankle with pain and swelling. States his diet was poor prior to the flare. He then restarted the colchicine 0.6 mg BID but not the uloric. He planned to start the uloric after this flare. He continues to follow up with Dr. Hollie Salk in nephrology. Last office visit there was inflammation of the left wrist extensor tendon but patient states this has resolved. He denies any swelling or pain in the hands. At his last office visit patient had a rash. Referred to dermatology for rash and associated wrist pain with concerns for psoriasis. He is scheduled to follow up with dermatology on 04/10. He does not have a rash today.   Activities of Daily Living:  Patient reports morning stiffness for 30 minutes.   Patient Reports nocturnal pain.  Difficulty dressing/grooming: Denies Difficulty climbing stairs: Denies Difficulty getting out of chair: Denies Difficulty using hands for taps, buttons, cutlery, and/or writing: Denies  Review of Systems  Constitutional: Negative.  Negative for fatigue.  HENT: Negative.  Negative for mouth sores and mouth dryness.   Eyes: Negative.  Negative for dryness.  Respiratory: Negative.  Negative for shortness of breath.   Cardiovascular: Negative.  Negative for chest pain and palpitations.  Gastrointestinal: Negative.  Negative for blood in stool, constipation and diarrhea.  Endocrine: Negative.   Negative for increased urination.  Genitourinary: Negative.  Negative for involuntary urination.  Musculoskeletal:  Positive for joint pain, joint pain, joint swelling, myalgias, muscle weakness, morning stiffness, muscle tenderness and myalgias. Negative for gait problem.  Skin: Negative.  Negative for pallor, rash, hair loss and sensitivity to sunlight.  Allergic/Immunologic: Negative.  Negative for susceptible to infections.  Neurological: Negative.  Negative for dizziness and headaches.  Hematological: Negative.  Negative for swollen glands.  Psychiatric/Behavioral: Negative.  Negative for depressed mood and sleep disturbance. The patient is not nervous/anxious.     PMFS History:  Patient Active Problem List   Diagnosis Date Noted   OSA (obstructive sleep apnea) 06/20/2022   Episodic cluster headache, not intractable 06/20/2022   Sinus congestion 06/20/2022   Morbid obesity ([place]) 06/20/2022   Episode of change in speech    Aphasia 05/10/2022   LUQ abdominal pain 11/03/2020   Generalized abdominal pain 11/03/2020   Altered bowel habits 11/03/2020   Nausea without vomiting 11/03/2020   Migraine without aura and without status migrainosus, not intractable 11/22/2018   Nonsustained ventricular tachycardia ([place]) 08/03/2018   Elevated serum creatinine 06/18/2018   Slow heart rate 06/06/2018   Shortness of breath 04/04/2018   Venous insufficiency 01/05/2017   Deep vein thrombosis (DVT) of popliteal vein of right lower extremity ([place]) 06/30/2016   S/P ORIF (open reduction internal fixation) fracture 06/22/2016   Multiple fractures of both lower extremities 05/31/2016   Elevated CK 05/17/2016  Fracture of right fibula 05/17/2016   Fracture of right tibia 05/17/2016   Fracture of tibia, left, closed 05/17/2016   Trauma 05/17/2016   Situational anxiety 08/28/2014   Unspecified vitamin D deficiency 09/22/2013   Shifting sleep-work schedule, affecting sleep 06/20/2013   Snoring  disorder 06/20/2013   Hypersomnia, persistent    History of esophageal stricture 04/16/2013   Pain in joint, ankle and foot 04/16/2013   Gout 04/16/2013   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   OBESITY, CLASS II, BMI 35- 39.9 03/15/2012   Hypertriglyceridemia 03/15/2012   Atypical chest pain 12/02/2008   Dysphagia 12/02/2008    Past Medical History:  Diagnosis Date   Acid reflux    Allergy    Anxiety    stress    Clotting disorder (HCC)    right leg x 2 in calf and behind knee    DVT (deep venous thrombosis) (HCC)    Esophagitis    Gout    Hypersomnia, persistent    IBS (irritable bowel syndrome)    PONV (postoperative nausea and vomiting)    nausea with no vomiting    Renal disorder    Shifting sleep-work schedule, affecting sleep 06/20/2013   Snoring disorder 06/20/2013   Tubular adenoma of colon     Family History  Problem Relation Age of Onset   Lupus Mother    Heart attack Father    Arthritis Father    Hypertension Father    Cancer Father        multiple myeloma   Diabetes Father    Atrial fibrillation Father    Gout Father    Kidney failure Father    Edema Sister    Diabetes Sister    Bipolar disorder Sister    Stroke Sister    Obesity Sister    Adrenal disorder Sister    Lupus Maternal Aunt    Eczema Daughter    ADD / ADHD Daughter    Asthma Daughter    Eczema Daughter    ADD / ADHD Daughter    Hypotension Daughter    Eczema Daughter    High Cholesterol Daughter    Colon cancer Neg Hx    Colon polyps Neg Hx    Esophageal cancer Neg Hx    Rectal cancer Neg Hx    Stomach cancer Neg Hx    Past Surgical History:  Procedure Laterality Date   COLONOSCOPY     ESOPHAGEAL DILATION     HEMORRHOID BANDING     ORIF TIBIA & FIBULA FRACTURES Right    right leg surgery x 2 places- rod in place knee to ankle    UPPER GASTROINTESTINAL ENDOSCOPY     wisdom teeth     wrist surgery Right    Social History   Social History Narrative   Right handed    Caffeine use: tea sometimes   Immunization History  Administered Date(s) Administered   Influenza,inj,Quad PF,6+ Mos 07/18/2022   PFIZER(Purple Top)SARS-COV-2 Vaccination 12/19/2019, 01/09/2020, 11/09/2020   Tdap 08/19/2011, 05/17/2016     Objective: Vital Signs: BP 120/83 (BP Location: Left Arm, Patient Position: Sitting, Cuff Size: Large)   Pulse 73   Ht 6' 3"$  (1.905 m)   Wt (!) 332 lb (150.6 kg)   BMI 41.50 kg/m    Physical Exam Vitals and nursing note reviewed.  Constitutional:      Appearance: He is well-developed.  HENT:     Head: Normocephalic and atraumatic.  Eyes:     Conjunctiva/sclera: Conjunctivae normal.  Pupils: Pupils are equal, round, and reactive to light.  Cardiovascular:     Rate and Rhythm: Normal rate and regular rhythm.     Heart sounds: Normal heart sounds.  Pulmonary:     Effort: Pulmonary effort is normal.     Breath sounds: Normal breath sounds.  Abdominal:     General: Bowel sounds are normal.     Palpations: Abdomen is soft.  Musculoskeletal:     Cervical back: Normal range of motion and neck supple.  Skin:    General: Skin is warm and dry.     Capillary Refill: Capillary refill takes less than 2 seconds.  Neurological:     Mental Status: He is alert and oriented to person, place, and time.  Psychiatric:        Behavior: Behavior normal.      Musculoskeletal Exam: Cervical, thoracic and lumbar spine were in good range of motion. Shoulder joints, elbow joints, wrist joints, MCPs PIPs and DIPs have good range of motion with no synovitis, warmth or effusion. No evidence of tenosynovitis. Hip joints, knee joints, ankles, MTPs and PIPs with good range of motion with no synovitis, warmth or effusion. No evidence of plantar fasciitis and achilles tendonitis.   CDAI Exam: CDAI Score: -- Patient Global: --; Provider Global: -- Swollen: --; Tender: -- Joint Exam 12/08/2022   No joint exam has been documented for this visit   There is  currently no information documented on the homunculus. Go to the Rheumatology activity and complete the homunculus joint exam.  Investigation: No additional findings.  Imaging: DG Chest Port 1 View  Result Date: 11/28/2022 CLINICAL DATA:  cp EXAM: PORTABLE CHEST 1 VIEW COMPARISON:  chest x-ray 05/10/2022. FINDINGS: The heart size and mediastinal contours are within normal limits. Both lungs are clear. No visible pleural effusions or pneumothorax. No acute osseous abnormality. IMPRESSION: No active disease. Electronically Signed   By: Margaretha Sheffield M.D.   On: 11/28/2022 18:20    Recent Labs: Lab Results  Component Value Date   WBC 6.6 11/28/2022   HGB 14.7 11/28/2022   PLT 241 11/28/2022   NA 136 11/28/2022   K 3.5 11/28/2022   CL 105 11/28/2022   CO2 24 11/28/2022   GLUCOSE 84 11/28/2022   BUN 12 11/28/2022   CREATININE 1.32 (H) 11/28/2022   BILITOT 0.8 10/02/2022   ALKPHOS 71 10/02/2022   AST 24 10/02/2022   ALT 29 10/02/2022   PROT 8.0 10/02/2022   ALBUMIN 4.0 10/02/2022   CALCIUM 8.5 (L) 11/28/2022   GFRAA 58 (L) 03/22/2020    Speciality Comments: Allopurinol-rash  Procedures:  No procedures performed Allergies: Peanuts [peanut oil], Lactose, Allopurinol, Caffeine, Gabapentin, Ibuprofen, Omeprazole, Penicillins, and Seasonal ic [cholestatin]   Assessment / Plan:     Visit Diagnoses: Chronic gouty arthropathy - Hyperuricemia not crystal proven. Uric acid on 08/02/2022 was 8.1 and on 09/17/22 was 7.7. He paused uloric acid and colchicine since 10/02/2022 when he developed c. diff infection and had not restarted the medications. On 12/01/2022 he developed pain and swelling in his left ankle and restarted colchine 0.6 mg BID. Educated patient that he needs to restart the uloric as soon as possible and adhere to low purine diet. He is not to hold the uloric if he develops another flare. Plan to recheck his uric acid at next office.  He will continue colchicine 0.6 mg p.o.  daily.  Plan to stop colchicine in the future if his uric acid stays between 4  and 5 consistently for several months.  He should notify us if he develops a gout flare.  He had no joint swelling on the examination today.  He states the swelling responded to colchicine use.  He recalls consuming red meat to trigger this flare.  Will check CBC, CMP with GFR and uric acid at the follow-up visit.  Primary osteoarthritis of both hands - No pain in the hands today. No evidence of synovitis or dactylitis. At the last office visit on 10/07/2022 there was inflammation of the extensor tendon of the left wrist raising concern for psoriatic arthritis with questionable history of eczema and dermatitis in the past. There was no evidence of inflammation today. No evidence of plantar fasciitis or achilles tendonitis. No rash today, Patient is scheduled to follow up with dermatology on 01/18/2023 for further evaluation of rash. Encouraged patient to take pictures of rash when it occurs.   Primary osteoarthritis of both knees - Bilateral moderate osteoarthritis and mild chondromalacia patella. Not painful today.  No warmth swelling or effusion was noted.  Lower extremity exercise and weight loss was discussed.  Pain in both feet - History of intermittent swelling and inflammation of the Achilles tendon at the insertion site on the left foot. No evidence of plantar fasciitis or achilles tendonitis today.   S/P ORIF (open reduction internal fixation) fracture - 2017.  Elevated serum creatinine - History of chronic NSAID use.  Patient follows up with Dr. Hollie Salk in nephrology.   Multiple pulmonary nodules - Patient was evaluated by Dr. Vaughan Browner.  He had elevated ACE level but CT scan not typical for sarcoidosis.  Nodules resolved on the follow-up scan. Scheduled for repeat CT scan of the chest on 12/27/2022 and follow up with Dr. Vaughan Browner.   History of Clostridioides difficile colitis - October 02, 2022.  He was treated with  vancomycin.  C. difficile PCR positive on October 28, 2022.  Second course of Vanco for 14 days given by his PCP. Diarrhea has improved. Educated patient to restart colchicine and uloric now that his c.diff infection has resolved.   Rash - Inguinal and axillary. At office visit on 10/07/2022 there was concern for possible psoriasis and patient was referred to dermatology. He is scheduled to follow up with dermatology on 04/10. No rash today. Encouraged patient to take photographs of rash when it occurs in case there is not a rash when he follows up with dermatology.   Other medical concerns are listed as follows:  History of fibula fracture  History of fracture of tibia  Nonsustained ventricular tachycardia (HCC)  Venous insufficiency  History of DVT (deep vein thrombosis) - In 2017 after the crush injury.  History of esophageal stricture  Situational anxiety  Hypersomnia, persistent  Migraine without aura and without status migrainosus, not intractable  Class 3 severe obesity with body mass index (BMI) of 40.0 to 44.9 in adult, unspecified obesity type, unspecified whether serious comorbidity present ([place])  Orders: No orders of the defined types were placed in this encounter.  No orders of the defined types were placed in this encounter.  Face-to-face time spent with patient was 30 minutes. Greater than 50% of time was spent in counseling and coordination of care.  Follow-Up Instructions: Return in about 3 months (around 03/08/2023) for Osteoarthritis, Gout.   Bo Merino, MD  Note - This record has been created using Editor, commissioning.  Chart creation errors have been sought, but may not always  have been located. Such creation errors do  not reflect on  the standard of medical care.

## 2022-11-28 ENCOUNTER — Encounter (HOSPITAL_BASED_OUTPATIENT_CLINIC_OR_DEPARTMENT_OTHER): Payer: Self-pay

## 2022-11-28 ENCOUNTER — Emergency Department (HOSPITAL_BASED_OUTPATIENT_CLINIC_OR_DEPARTMENT_OTHER): Payer: Managed Care, Other (non HMO)

## 2022-11-28 ENCOUNTER — Ambulatory Visit: Payer: Managed Care, Other (non HMO) | Admitting: Adult Health

## 2022-11-28 ENCOUNTER — Emergency Department (HOSPITAL_BASED_OUTPATIENT_CLINIC_OR_DEPARTMENT_OTHER)
Admission: EM | Admit: 2022-11-28 | Discharge: 2022-11-28 | Disposition: A | Discharge status: Home / Self Care | Payer: Managed Care, Other (non HMO) | Attending: Emergency Medicine | Admitting: Emergency Medicine

## 2022-11-28 ENCOUNTER — Encounter: Payer: Self-pay | Admitting: Adult Health

## 2022-11-28 ENCOUNTER — Other Ambulatory Visit: Payer: Self-pay

## 2022-11-28 VITALS — BP 158/97 | HR 72 | Ht 75.0 in | Wt 329.0 lb

## 2022-11-28 DIAGNOSIS — R0789 Other chest pain: Secondary | ICD-10-CM | POA: Insufficient documentation

## 2022-11-28 DIAGNOSIS — I129 Hypertensive chronic kidney disease with stage 1 through stage 4 chronic kidney disease, or unspecified chronic kidney disease: Secondary | ICD-10-CM | POA: Diagnosis not present

## 2022-11-28 DIAGNOSIS — Z91010 Allergy to peanuts: Secondary | ICD-10-CM | POA: Insufficient documentation

## 2022-11-28 DIAGNOSIS — N189 Chronic kidney disease, unspecified: Secondary | ICD-10-CM | POA: Insufficient documentation

## 2022-11-28 DIAGNOSIS — Z79899 Other long term (current) drug therapy: Secondary | ICD-10-CM | POA: Diagnosis not present

## 2022-11-28 DIAGNOSIS — G4733 Obstructive sleep apnea (adult) (pediatric): Secondary | ICD-10-CM | POA: Diagnosis not present

## 2022-11-28 DIAGNOSIS — R944 Abnormal results of kidney function studies: Secondary | ICD-10-CM | POA: Insufficient documentation

## 2022-11-28 LAB — BASIC METABOLIC PANEL
Anion gap: 7 (ref 5–15)
BUN: 12 mg/dL (ref 6–20)
CO2: 24 mmol/L (ref 22–32)
Calcium: 8.5 mg/dL — ABNORMAL LOW (ref 8.9–10.3)
Chloride: 105 mmol/L (ref 98–111)
Creatinine, Ser: 1.32 mg/dL — ABNORMAL HIGH (ref 0.61–1.24)
GFR, Estimated: >60 mL/min (ref >60)
Glucose, Bld: 84 mg/dL (ref 70–99)
Potassium: 3.5 mmol/L (ref 3.5–5.1)
Sodium: 136 mmol/L (ref 135–145)

## 2022-11-28 LAB — TROPONIN I (HIGH SENSITIVITY)
Troponin I (High Sensitivity): 2 ng/L (ref <18)
Troponin I (High Sensitivity): <2 ng/L (ref <18)

## 2022-11-28 LAB — CBC
HCT: 43.9 % (ref 39.0–52.0)
Hemoglobin: 14.7 g/dL (ref 13.0–17.0)
MCH: 29.3 pg (ref 26.0–34.0)
MCHC: 33.5 g/dL (ref 30.0–36.0)
MCV: 87.5 fL (ref 80.0–100.0)
Platelets: 241 10*3/uL (ref 150–400)
RBC: 5.02 MIL/uL (ref 4.22–5.81)
RDW: 14 % (ref 11.5–15.5)
WBC: 6.6 10*3/uL (ref 4.0–10.5)
nRBC: 0 % (ref 0.0–0.2)

## 2022-11-28 NOTE — ED Provider Notes (Signed)
[facility] Provider Note   CSN: [id number] Arrival date & time: 11/28/22  1359     History  Chief Complaint  Patient presents with   Hypertension    Francis Lin is a 47 y.o. male.  With history of CKD, OSA, GERD, anxiety who presents to the ED for evaluation of hypertension.  He states he presented to his neurologist for reevaluation of his OSA today when his blood pressure was found to be 150/90.  He called his primary care provider and spoke with nurse triage and told them that he has had some intermittent mild chest discomfort since Saturday and was encouraged to come to the ED for further evaluation.  He states that this chest discomfort is left-sided and does not radiate.  It is described as a very mild aching with a tight sensation as well.  It is not exertional.  He states it lasts a few hours at a time and resolve spontaneously each time.  He is not able to discern any aggravating factors.  He denies shortness of breath, cough, dizziness, lightheadedness, nausea, vomiting, diaphoresis.  Denies other cardiac diseases or family history of sudden cardiac death.  States his blood pressure is typically 130/80.  Does not take any antihypertensives due to his CKD.  Believes his symptoms may be secondary to indigestion.   Hypertension Associated symptoms include chest pain.       Home Medications Prior to Admission medications   Medication Sig Start Date End Date Taking? Authorizing Provider  acetaminophen (TYLENOL) 325 MG tablet Take 650 mg by mouth every 6 (six) hours as needed for mild pain.    [provider]  colchicine 0.6 MG tablet Take 0.6 mg 1 tablet by mouth daily. 10/07/22   Ofilia Neas, PA-C  cyanocobalamin 1000 MCG tablet Take 1 tablet (1,000 mcg total) by mouth daily. 05/13/22   Cherene Altes, MD  cyclobenzaprine (FLEXERIL) 10 MG tablet Take 1 tablet (10 mg total) by mouth 2 (two) times daily as needed for  muscle spasms. 06/01/21   Lucrezia Starch, MD  dicyclomine (BENTYL) 20 MG tablet Take 1 tablet (20 mg total) by mouth 2 (two) times daily as needed (abdominal cramping). Patient taking differently: Take 20 mg by mouth as needed (abdominal cramping). 10/02/22   Truddie Hidden, MD  diphenoxylate-atropine (LOMOTIL) 2.5-0.025 MG tablet Take 1 tablet by mouth 4 (four) times daily as needed for diarrhea or loose stools. 10/02/22   Truddie Hidden, MD  febuxostat (ULORIC) 40 MG tablet Take 1 tablet (40 mg total) by mouth daily. 10/07/22   Ofilia Neas, PA-C  fluocinolone (VANOS) 0.01 % cream Apply 1 Application topically 2 (two) times daily as needed (Itching).    [provider]  fluticasone (FLONASE) 50 MCG/ACT nasal spray Place 1 spray into both nostrils daily as needed for allergies or rhinitis.    [provider]  loratadine (CLARITIN) 10 MG tablet Take 10 mg by mouth daily as needed for allergies.    [provider]  nystatin cream (MYCOSTATIN) Apply 1 Application topically 2 (two) times daily as needed for dry skin (Itching).    [provider]  topiramate (TOPAMAX) 25 MG tablet Take 1 tablet (25 mg total) by mouth 2 (two) times daily. 06/20/22   Dohmeier, Asencion Partridge, MD  cimetidine (TAGAMET) 200 MG tablet Take by mouth. 01/30/19 01/16/20  [provider]      Allergies    Peanuts [peanut  oil], Lactose, Allopurinol, Caffeine, Gabapentin, Ibuprofen, Omeprazole, Penicillins, and Seasonal ic [cholestatin]    Review of Systems   Review of Systems  Cardiovascular:  Positive for chest pain.  All other systems reviewed and are negative.   Physical Exam Updated Vital Signs BP 118/80   Pulse 62   Temp 98.6 F (37 C)   Resp 16   Ht 6' 3"$  (1.905 m)   Wt (!) 149.2 kg   SpO2 100%   BMI 41.12 kg/m  Physical Exam Vitals and nursing note reviewed.  Constitutional:      General: He is not in acute distress.    Appearance: Normal appearance. He is  well-developed. He is not ill-appearing, toxic-appearing or diaphoretic.     Comments: Resting comfortably in bed  HENT:     Head: Normocephalic and atraumatic.  Eyes:     Conjunctiva/sclera: Conjunctivae normal.  Cardiovascular:     Rate and Rhythm: Normal rate and regular rhythm.     Pulses:          Radial pulses are 2+ on the right side and 2+ on the left side.     Heart sounds: No murmur heard. Pulmonary:     Effort: Pulmonary effort is normal. No respiratory distress.     Breath sounds: Normal breath sounds. No stridor. No wheezing, rhonchi or rales.  Abdominal:     Palpations: Abdomen is soft.     Tenderness: There is no abdominal tenderness.  Musculoskeletal:        General: No swelling.     Cervical back: Normal range of motion and neck supple.     Right lower leg: No edema.     Left lower leg: No edema.  Skin:    General: Skin is warm and dry.     Capillary Refill: Capillary refill takes less than 2 seconds.  Neurological:     General: No focal deficit present.     Mental Status: He is alert and oriented to person, place, and time.  Psychiatric:        Mood and Affect: Mood normal.     ED Results / Procedures / Treatments   Labs (all labs ordered are listed, but only abnormal results are displayed) Labs Reviewed  BASIC METABOLIC PANEL - Abnormal; Notable for the following components:      Result Value   Creatinine, Ser 1.32 (*)    Calcium 8.5 (*)    All other components within normal limits  CBC  TROPONIN I (HIGH SENSITIVITY)  TROPONIN I (HIGH SENSITIVITY)    EKG EKG Interpretation  Date/Time:  Monday November 28 2022 14:07:24 EST Ventricular Rate:  70 PR Interval:  150 QRS Duration: 100 QT Interval:  378 QTC Calculation: 408 R Axis:   40 Text Interpretation: Sinus rhythm Confirmed by Regan Lemming ([id number]) on 11/28/2022 6:38:54 PM  Radiology DG Chest Port 1 View  Result Date: 11/28/2022 CLINICAL DATA:  cp EXAM: PORTABLE CHEST 1 VIEW COMPARISON:   chest x-ray 05/10/2022. FINDINGS: The heart size and mediastinal contours are within normal limits. Both lungs are clear. No visible pleural effusions or pneumothorax. No acute osseous abnormality. IMPRESSION: No active disease. Electronically Signed   By: Margaretha Sheffield M.D.   On: 11/28/2022 18:20    Procedures Procedures    Medications Ordered in ED Medications - No data to display  ED Course/ Medical Decision Making/ A&P             HEART Score: 2  Medical Decision Making Amount and/or Complexity of Data Reviewed Labs: ordered.  This patient presents to the ED for concern of chest pain, hypertension, this involves an extensive number of treatment options, and is a complaint that carries with it a high risk of complications and morbidity.  The emergent differential diagnosis of chest pain includes: Acute coronary syndrome, pericarditis, aortic dissection, pulmonary embolism, tension pneumothorax, and esophageal rupture.  I do not believe the patient has an emergent cause of chest pain, other urgent/non-acute considerations include, but are not limited to: chronic angina, aortic stenosis, cardiomyopathy, myocarditis, mitral valve prolapse, pulmonary hypertension, hypertrophic obstructive cardiomyopathy (HOCM), aortic insufficiency, right ventricular hypertrophy, pneumonia, pleuritis, bronchitis, pneumothorax, tumor, gastroesophageal reflux disease (GERD), esophageal spasm, Mallory-Weiss syndrome, peptic ulcer disease, biliary disease, pancreatitis, functional gastrointestinal pain, cervical or thoracic disk disease or arthritis, shoulder arthritis, costochondritis, subacromial bursitis, anxiety or panic attack, herpes zoster, breast disorders, chest wall tumors, thoracic outlet syndrome, mediastinitis.   Co morbidities that complicate the patient evaluation  CKD, OSA, GERD, anxiety  My initial workup includes ACS rule out  Additional history obtained from: Nursing notes  from this visit.  I ordered, reviewed and interpreted labs which include: CBC, BMP, troponin, delta troponin.  BMP with an elevated creatinine of 1.32 which is baseline per patient  I ordered imaging studies including chest x-ray I independently visualized and interpreted imaging which showed normal I agree with the radiologist interpretation  Afebrile, hemodynamically stable.  Not hypertensive in the ED.  46 year old male presenting to the ED for evaluation of slightly elevated blood pressure reading at his neurology office earlier today.  He also states he has some left-sided chest discomfort since Saturday.  The chest pain is described as very mild and is reproducible with palpation and certain movements.  Initial and delta troponin both negative.  EKG shows no ischemic changes.  Chest x-ray negative.  Heart score of 2 and I have low suspicion for ACS as a cause of his symptoms.  More likely is a muscular strain.  He was encouraged to follow-up with his primary care provider in 7 to 10 days regardless of symptom improvement for reevaluation.  He was given strict return precautions.  Stable at discharge.  At this time there does not appear to be any evidence of an acute emergency medical condition and the patient appears stable for discharge with appropriate outpatient follow up. Diagnosis was discussed with patient who verbalizes understanding of care plan and is agreeable to discharge. I have discussed return precautions with patient who verbalizes understanding. Patient encouraged to follow-up with their PCP within 1 week. All questions answered.  Patient's case discussed with Dr. Armandina Gemma who agrees with plan to discharge with follow-up.   Note: Portions of this report may have been transcribed using voice recognition software. Every effort was made to ensure accuracy; however, inadvertent computerized transcription errors may still be present.        Final Clinical Impression(s) / ED  Diagnoses Final diagnoses:  Atypical chest pain    Rx / DC Orders ED Discharge Orders     None         Roylene Reason, Hershal Coria 11/28/22 1846    Regan Lemming, MD 11/29/22 [phone]

## 2022-11-28 NOTE — ED Triage Notes (Signed)
States was at neurologist for initial CPAP visit. States they noted he was hypertensive there. States he has been feeling jittery. States had chest pain Saturday, dizziness yesterday. Denies symptoms currently.  BP normal for him here during triage.

## 2022-11-28 NOTE — Discharge Instructions (Signed)
You have been seen today for your complaint of high blood pressure, chest pain. Your lab work was overall reassuring. Your imaging was reassuring and showed no abnormalities. Follow up with: Your primary care provider in 1 week for reevaluation Please seek immediate medical care if you develop any of the following symptoms: Your chest pain gets worse. You have a cough that gets worse, or you cough up blood. You have severe pain in your abdomen. You faint. You have sudden, unexplained chest discomfort. You have sudden, unexplained discomfort in your arms, back, neck, or jaw. You have shortness of breath at any time. You suddenly start to sweat, or your skin gets clammy. You feel nausea or you vomit. You suddenly feel lightheaded or dizzy. You have severe weakness, or unexplained weakness or fatigue. Your heart begins to beat quickly, or it feels like it is skipping beats. At this time there does not appear to be the presence of an emergent medical condition, however there is always the potential for conditions to change. Please read and follow the below instructions.  Do not take your medicine if  develop an itchy rash, swelling in your mouth or lips, or difficulty breathing; call 911 and seek immediate emergency medical attention if this occurs.  You may review your lab tests and imaging results in their entirety on your MyChart account.  Please discuss all results of fully with your primary care provider and other specialist at your follow-up visit.  Note: Portions of this text may have been transcribed using voice recognition software. Every effort was made to ensure accuracy; however, inadvertent computerized transcription errors may still be present.

## 2022-12-08 ENCOUNTER — Ambulatory Visit: Payer: Managed Care, Other (non HMO) | Attending: Rheumatology | Admitting: Rheumatology

## 2022-12-08 ENCOUNTER — Encounter: Payer: Self-pay | Admitting: Neurology

## 2022-12-08 ENCOUNTER — Encounter: Payer: Self-pay | Admitting: Rheumatology

## 2022-12-08 VITALS — BP 120/83 | HR 73 | Ht 75.0 in | Wt 332.0 lb

## 2022-12-08 DIAGNOSIS — M1A9XX0 Chronic gout, unspecified, without tophus (tophi): Secondary | ICD-10-CM

## 2022-12-08 DIAGNOSIS — M170 Bilateral primary osteoarthritis of knee: Secondary | ICD-10-CM | POA: Diagnosis not present

## 2022-12-08 DIAGNOSIS — Z86718 Personal history of other venous thrombosis and embolism: Secondary | ICD-10-CM

## 2022-12-08 DIAGNOSIS — M1A00X0 Idiopathic chronic gout, unspecified site, without tophus (tophi): Secondary | ICD-10-CM | POA: Diagnosis not present

## 2022-12-08 DIAGNOSIS — I872 Venous insufficiency (chronic) (peripheral): Secondary | ICD-10-CM

## 2022-12-08 DIAGNOSIS — G43009 Migraine without aura, not intractable, without status migrainosus: Secondary | ICD-10-CM

## 2022-12-08 DIAGNOSIS — M19042 Primary osteoarthritis, left hand: Secondary | ICD-10-CM

## 2022-12-08 DIAGNOSIS — M19041 Primary osteoarthritis, right hand: Secondary | ICD-10-CM | POA: Diagnosis not present

## 2022-12-08 DIAGNOSIS — R21 Rash and other nonspecific skin eruption: Secondary | ICD-10-CM

## 2022-12-08 DIAGNOSIS — I4729 Other ventricular tachycardia: Secondary | ICD-10-CM

## 2022-12-08 DIAGNOSIS — Z8781 Personal history of (healed) traumatic fracture: Secondary | ICD-10-CM

## 2022-12-08 DIAGNOSIS — F418 Other specified anxiety disorders: Secondary | ICD-10-CM

## 2022-12-08 DIAGNOSIS — Z6841 Body Mass Index (BMI) 40.0 and over, adult: Secondary | ICD-10-CM

## 2022-12-08 DIAGNOSIS — M79672 Pain in left foot: Secondary | ICD-10-CM

## 2022-12-08 DIAGNOSIS — Z8719 Personal history of other diseases of the digestive system: Secondary | ICD-10-CM

## 2022-12-08 DIAGNOSIS — G4710 Hypersomnia, unspecified: Secondary | ICD-10-CM

## 2022-12-08 DIAGNOSIS — Z8619 Personal history of other infectious and parasitic diseases: Secondary | ICD-10-CM

## 2022-12-08 DIAGNOSIS — M79671 Pain in right foot: Secondary | ICD-10-CM | POA: Diagnosis not present

## 2022-12-08 DIAGNOSIS — R7989 Other specified abnormal findings of blood chemistry: Secondary | ICD-10-CM

## 2022-12-08 DIAGNOSIS — R918 Other nonspecific abnormal finding of lung field: Secondary | ICD-10-CM

## 2022-12-08 DIAGNOSIS — Z9889 Other specified postprocedural states: Secondary | ICD-10-CM

## 2022-12-08 NOTE — Telephone Encounter (Signed)
Yes that is fine. He can call us if headaches persist despite restarting for possible need of dosage increase. Thank you!

## 2022-12-08 NOTE — Patient Instructions (Signed)
Please take colchicine 0.6 mg tablet 1 tablet daily   Please take Uloric 40 mg daily without interruption.  Do not stop Uloric or colchicine if you have a flare.

## 2022-12-12 ENCOUNTER — Other Ambulatory Visit: Payer: Self-pay | Admitting: Neurology

## 2022-12-12 MED ORDER — TOPIRAMATE 25 MG PO TABS: ORAL_TABLET | ORAL | 0 refills | Status: DC

## 2022-12-27 ENCOUNTER — Ambulatory Visit
Admission: RE | Admit: 2022-12-27 | Discharge: 2022-12-27 | Disposition: A | Discharge status: Home / Self Care | Payer: Managed Care, Other (non HMO) | Source: Ambulatory Visit | Attending: Pulmonary Disease | Admitting: Pulmonary Disease

## 2022-12-27 DIAGNOSIS — R0602 Shortness of breath: Secondary | ICD-10-CM

## 2023-01-04 ENCOUNTER — Telehealth: Payer: Self-pay | Admitting: Pulmonary Disease

## 2023-01-04 DIAGNOSIS — R911 Solitary pulmonary nodule: Secondary | ICD-10-CM

## 2023-01-04 NOTE — Telephone Encounter (Signed)
Spoke with patient in regards to CT results.  Patient wants to know why the lung nodules come back then go away. He wonders if this could be related to inflammation?  Dr. Vaughan Browner please advise?

## 2023-01-04 NOTE — Telephone Encounter (Signed)
Dr. Vaughan Browner can you please advise on patients CT scan results

## 2023-01-04 NOTE — Telephone Encounter (Signed)
Patient called to request his CT scan results.  Please call to discuss at [phone]

## 2023-01-04 NOTE — Telephone Encounter (Signed)
CT shows tiny lung nodules which will need follow-up in 1 year.  Please order follow-up CT chest without contrast in 1 year  There is also a thyroid nodule and the recommendation is to get a thyroid biopsy.  Please follow-up with your primary care regarding this.

## 2023-01-05 NOTE — Addendum Note (Signed)
Addended by: Elton Sin on: 01/05/2023 04:35 PM   Modules accepted: Orders

## 2023-01-05 NOTE — Telephone Encounter (Signed)
Pt had called back and I let him know that Dr Vaughan Browner is unavailable, but msg has been routed to him and will call back with a response once msg has been addressed. He verbalized understanding and nothing further needed at this time.    Dr Vaughan Browner, please see below msg from [facility] and advise, thanks!

## 2023-01-05 NOTE — Telephone Encounter (Signed)
I called and discussed with patient regarding the lung nodule.  Patient will follow-up with his primary care about the thyroid nodule  Please make sure that he has CT scan without contrast ordered in 1 year

## 2023-01-05 NOTE — Telephone Encounter (Signed)
CT without contrast for lung nodule order placed for 12/2023.  Nothing further at this time.

## 2023-01-20 ENCOUNTER — Ambulatory Visit: Payer: Managed Care, Other (non HMO) | Admitting: Adult Health

## 2023-01-20 ENCOUNTER — Encounter: Payer: Self-pay | Admitting: Adult Health

## 2023-01-20 VITALS — BP 120/80 | HR 76 | Temp 98.0°F | Ht 75.0 in | Wt 336.0 lb

## 2023-01-20 DIAGNOSIS — E041 Nontoxic single thyroid nodule: Secondary | ICD-10-CM | POA: Diagnosis not present

## 2023-01-20 NOTE — Progress Notes (Signed)
Subjective:    Patient ID: Francis Lin, male    DOB: 13-Oct-1975, 47 y.o.   MRN: [id number]  HPI 47 year old male who  has a past medical history of Acid reflux, Allergy, Anxiety, Clotting disorder, DVT (deep venous thrombosis), Esophagitis, Gout, Hypersomnia, persistent, IBS (irritable bowel syndrome), PONV (postoperative nausea and vomiting), Renal disorder, Shifting sleep-work schedule, affecting sleep (06/20/2013), Snoring disorder (06/20/2013), and Tubular adenoma of colon.  He was advised by his pulmonologist to follow-up with PCP due to thyroid nodule.  About a month ago he had CT of the chest without contrast.  The CT showed 3 new pulmonary nodules measuring 4 mm or less in size.  Incidentally he had a 2.5 cm low-attenuation right thyroid nodule noted.  Advised thyroid ultrasound   Review of Systems See HPI   Past Medical History:  Diagnosis Date   Acid reflux    Allergy    Anxiety    stress    Clotting disorder    right leg x 2 in calf and behind knee    DVT (deep venous thrombosis)    Esophagitis    Gout    Hypersomnia, persistent    IBS (irritable bowel syndrome)    PONV (postoperative nausea and vomiting)    nausea with no vomiting    Renal disorder    Shifting sleep-work schedule, affecting sleep 06/20/2013   Snoring disorder 06/20/2013   Tubular adenoma of colon     Social History   Socioeconomic History   Marital status: Married    Spouse name: Not on file   Number of children: 3   Years of education: college   Highest education level: Not on file  Occupational History    Employer: [employer]    Comment: [employer]  Tobacco Use   Smoking status: Never    Passive exposure: Past   Smokeless tobacco: Never  Vaping Use   Vaping Use: Never used  Substance and Sexual Activity   Alcohol use: No   Drug use: No   Sexual activity: Not on file  Other Topics Concern   Not on file  Social History Narrative   Right handed   Caffeine use:  tea sometimes   Social Determinants of Corporate investment banker Strain: Not on file  Food Insecurity: Not on file  Transportation Needs: Not on file  Physical Activity: Not on file  Stress: Not on file  Social Connections: Not on file  Intimate Partner Violence: Not on file    Past Surgical History:  Procedure Laterality Date   COLONOSCOPY     ESOPHAGEAL DILATION     HEMORRHOID BANDING     ORIF TIBIA & FIBULA FRACTURES Right    right leg surgery x 2 places- rod in place knee to ankle    UPPER GASTROINTESTINAL ENDOSCOPY     wisdom teeth     wrist surgery Right     Family History  Problem Relation Age of Onset   Lupus Mother    Heart attack Father    Arthritis Father    Hypertension Father    Cancer Father        multiple myeloma   Diabetes Father    Atrial fibrillation Father    Gout Father    Kidney failure Father    Edema Sister    Diabetes Sister    Bipolar disorder Sister    Stroke Sister    Obesity Sister    Adrenal disorder Sister  Lupus Maternal Aunt    Eczema Daughter    ADD / ADHD Daughter    Asthma Daughter    Eczema Daughter    ADD / ADHD Daughter    Hypotension Daughter    Eczema Daughter    High Cholesterol Daughter    Colon cancer Neg Hx    Colon polyps Neg Hx    Esophageal cancer Neg Hx    Rectal cancer Neg Hx    Stomach cancer Neg Hx     Allergies  Allergen Reactions   Peanuts [Peanut Oil] Anaphylaxis   Lactose Diarrhea and Other (See Comments)    Lactose intolerance Lactose intolerance Lactose intolerance   Allopurinol     rash   Caffeine Other (See Comments)    "makes feel bad", headaches   Gabapentin Other (See Comments)    Messes with memory   Ibuprofen Other (See Comments)    Early onset of kidney issues (Stage 3A)   Omeprazole Other (See Comments)    Mood swing   Penicillins Other (See Comments)    Headaches    Seasonal Ic [Cholestatin]     Sneezing,runny nose , itchy eyes    Current Outpatient Medications on  File Prior to Visit  Medication Sig Dispense Refill   acetaminophen (TYLENOL) 325 MG tablet Take 650 mg by mouth every 6 (six) hours as needed for mild pain.     colchicine 0.6 MG tablet Take 0.6 mg 1 tablet by mouth daily. 90 tablet 0   cyanocobalamin 1000 MCG tablet Take 1 tablet (1,000 mcg total) by mouth daily.     cyclobenzaprine (FLEXERIL) 10 MG tablet Take 1 tablet (10 mg total) by mouth 2 (two) times daily as needed for muscle spasms. 20 tablet 0   febuxostat (ULORIC) 40 MG tablet Take 1 tablet (40 mg total) by mouth daily. 30 tablet 2   fluocinolone (VANOS) 0.01 % cream Apply 1 Application topically 2 (two) times daily as needed (Itching).     fluticasone (FLONASE) 50 MCG/ACT nasal spray Place 1 spray into both nostrils daily as needed for allergies or rhinitis.     loratadine (CLARITIN) 10 MG tablet Take 10 mg by mouth daily as needed for allergies.     nystatin cream (MYCOSTATIN) Apply 1 Application topically 2 (two) times daily as needed for dry skin (Itching).     topiramate (TOPAMAX) 25 MG tablet Take 1 tablet (25 mg total) by mouth at bedtime for 7 days, THEN 2 tablets (50 mg total) at bedtime for 21 days. 50 tablet 0   [DISCONTINUED] cimetidine (TAGAMET) 200 MG tablet Take by mouth.     No current facility-administered medications on file prior to visit.    BP 120/80   Pulse 76   Temp 98 F (36.7 C) (Oral)   Ht  (1.905 m)   Wt (!) 336 lb (152.4 kg)   SpO2 98%   BMI 42.00 kg/m       Objective:   Physical Exam Vitals and nursing note reviewed.  Constitutional:      Appearance: Normal appearance.  Neck:     Thyroid: Thyroid mass present. No thyromegaly or thyroid tenderness.  Cardiovascular:     Rate and Rhythm: Normal rate and regular rhythm.     Pulses: Normal pulses.     Heart sounds: Normal heart sounds.  Pulmonary:     Effort: Pulmonary effort is normal.     Breath sounds: Normal breath sounds.  Lymphadenopathy:     Cervical: No  cervical adenopathy.   Neurological:     General: No focal deficit present.     Mental Status: He is alert and oriented to person, place, and time.  Psychiatric:        Mood and Affect: Mood normal.        Behavior: Behavior normal.        Thought Content: Thought content normal.        Judgment: Judgment normal.       Assessment & Plan:  1. Thyroid nodule - US THYROID; Future  Shirline Frees, NP

## 2023-01-26 LAB — LAB REPORT - SCANNED
Albumin, Urine POC: <3
Albumin/Creatinine Ratio, Urine, POC: <2
Creatinine, Random Urine: 127.8
Protein/Creatinine Ratio: 40
Total Protein, Urine: 5.1

## 2023-02-06 ENCOUNTER — Telehealth: Payer: Self-pay

## 2023-02-06 NOTE — Telephone Encounter (Signed)
Patient is scheduled for 02/09/2023 at 8am.

## 2023-02-06 NOTE — Telephone Encounter (Signed)
Patient contacted the office and states his left ankle has been swelling when he is on it but goes down when he lays down. Patient states he is having pain in both of his knees, left hip, and back. Patient states everything started about a week ago. Patient states he had a root canal procedure and everything started after that procedure. Patient states he has been taking Colchicine, Uloric, and vitamin B12 routinely and he is still having pain. Patient inquires if he should contact us or contact his Primary care. Patient call back number is [phone]. Please advise.

## 2023-02-06 NOTE — Progress Notes (Unsigned)
ACUTE VISIT No chief complaint on file.  HPI: [name] is a 47 y.o. male, who is here today complaining of *** HPI  Review of Systems See other pertinent positives and negatives in HPI.  Current Outpatient Medications on File Prior to Visit  Medication Sig Dispense Refill   acetaminophen (TYLENOL) 325 MG tablet Take 650 mg by mouth every 6 (six) hours as needed for mild pain.     colchicine 0.6 MG tablet Take 0.6 mg 1 tablet by mouth daily. 90 tablet 0   cyanocobalamin 1000 MCG tablet Take 1 tablet (1,000 mcg total) by mouth daily.     cyclobenzaprine (FLEXERIL) 10 MG tablet Take 1 tablet (10 mg total) by mouth 2 (two) times daily as needed for muscle spasms. 20 tablet 0   febuxostat (ULORIC) 40 MG tablet Take 1 tablet (40 mg total) by mouth daily. 30 tablet 2   fluocinolone (VANOS) 0.01 % cream Apply 1 Application topically 2 (two) times daily as needed (Itching).     fluticasone (FLONASE) 50 MCG/ACT nasal spray Place 1 spray into both nostrils daily as needed for allergies or rhinitis.     loratadine (CLARITIN) 10 MG tablet Take 10 mg by mouth daily as needed for allergies.     nystatin cream (MYCOSTATIN) Apply 1 Application topically 2 (two) times daily as needed for dry skin (Itching).     topiramate (TOPAMAX) 25 MG tablet Take 1 tablet (25 mg total) by mouth at bedtime for 7 days, THEN 2 tablets (50 mg total) at bedtime for 21 days. 50 tablet 0   [DISCONTINUED] cimetidine (TAGAMET) 200 MG tablet Take by mouth.     No current facility-administered medications on file prior to visit.    Past Medical History:  Diagnosis Date   Acid reflux    Allergy    Anxiety    stress    Clotting disorder (HCC)    right leg x 2 in calf and behind knee    DVT (deep venous thrombosis) (HCC)    Esophagitis    Gout    Hypersomnia, persistent    IBS (irritable bowel syndrome)    PONV (postoperative nausea and vomiting)    nausea with no vomiting    Renal disorder    Shifting  sleep-work schedule, affecting sleep 06/20/2013   Snoring disorder 06/20/2013   Tubular adenoma of colon    Allergies  Allergen Reactions   Peanuts [Peanut Oil] Anaphylaxis   Lactose Diarrhea and Other (See Comments)    Lactose intolerance Lactose intolerance Lactose intolerance   Allopurinol     rash   Caffeine Other (See Comments)    "makes feel bad", headaches   Gabapentin Other (See Comments)    Messes with memory   Ibuprofen Other (See Comments)    Early onset of kidney issues (Stage 3A)   Omeprazole Other (See Comments)    Mood swing   Penicillins Other (See Comments)    Headaches    Seasonal Ic [Cholestatin]     Sneezing,runny nose , itchy eyes    Social History   Socioeconomic History   Marital status: Married    Spouse name: Not on file   Number of children: 3   Years of education: college   Highest education level: Not on file  Occupational History    Employer: [employer]    Comment: [employer]  Tobacco Use   Smoking status: Never    Passive exposure: Past   Smokeless tobacco: Never  Vaping Use   Vaping Use: Never used  Substance and Sexual Activity   Alcohol use: No   Drug use: No   Sexual activity: Not on file  Other Topics Concern   Not on file  Social History Narrative   Right handed   Caffeine use: tea sometimes   Social Determinants of Health   Financial Resource Strain: Not on file  Food Insecurity: Not on file  Transportation Needs: Not on file  Physical Activity: Not on file  Stress: Not on file  Social Connections: Not on file    There were no vitals filed for this visit. There is no height or weight on file to calculate BMI.  Physical Exam  ASSESSMENT AND PLAN: There are no diagnoses linked to this encounter.  No follow-ups on file.  Francis Lin G. [place], MD  [facility]. [place] office.  Discharge Instructions   None

## 2023-02-06 NOTE — Telephone Encounter (Signed)
Please see if the patient can come in for an appointment for further evaluation.

## 2023-02-07 ENCOUNTER — Ambulatory Visit: Payer: Managed Care, Other (non HMO) | Admitting: Family Medicine

## 2023-02-07 ENCOUNTER — Encounter: Payer: Self-pay | Admitting: Family Medicine

## 2023-02-07 VITALS — BP 124/80 | HR 76 | Temp 98.3°F | Resp 12 | Ht 75.0 in | Wt 332.2 lb

## 2023-02-07 DIAGNOSIS — M2550 Pain in unspecified joint: Secondary | ICD-10-CM

## 2023-02-07 DIAGNOSIS — M549 Dorsalgia, unspecified: Secondary | ICD-10-CM | POA: Diagnosis not present

## 2023-02-07 DIAGNOSIS — M545 Low back pain, unspecified: Secondary | ICD-10-CM

## 2023-02-07 DIAGNOSIS — G8929 Other chronic pain: Secondary | ICD-10-CM | POA: Diagnosis not present

## 2023-02-07 NOTE — Patient Instructions (Addendum)
A few things to remember from today's visit:  Chronic left-sided low back pain, unspecified whether sciatica present - Plan: Ambulatory referral to Orthopedics Avoid medications like ibuprofen and naproxen. Acetaminophen/tylenol can be taken 3-4 times per day as needed. Topical voltaren or icy hot may help.  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 For upper and lower back pain I am sending you to ortho to evaluate mainly lower back pain.  Do not use My Chart to request refills or for acute issues that need immediate attention. If you send a my chart message, it may take a few days to be addressed, specially if I am not in the office.  Please be sure medication list is accurate. If a new problem present, please set up appointment sooner than planned today.

## 2023-02-09 ENCOUNTER — Ambulatory Visit: Payer: Managed Care, Other (non HMO) | Admitting: Rheumatology

## 2023-02-09 ENCOUNTER — Ambulatory Visit (HOSPITAL_BASED_OUTPATIENT_CLINIC_OR_DEPARTMENT_OTHER): Payer: Managed Care, Other (non HMO)

## 2023-02-09 ENCOUNTER — Other Ambulatory Visit (HOSPITAL_BASED_OUTPATIENT_CLINIC_OR_DEPARTMENT_OTHER): Payer: Self-pay | Admitting: Orthopaedic Surgery

## 2023-02-09 ENCOUNTER — Ambulatory Visit (HOSPITAL_BASED_OUTPATIENT_CLINIC_OR_DEPARTMENT_OTHER): Payer: Managed Care, Other (non HMO) | Admitting: Student

## 2023-02-09 DIAGNOSIS — M5442 Lumbago with sciatica, left side: Secondary | ICD-10-CM | POA: Diagnosis not present

## 2023-02-09 DIAGNOSIS — M25572 Pain in left ankle and joints of left foot: Secondary | ICD-10-CM | POA: Diagnosis not present

## 2023-02-09 MED ORDER — TRIAMCINOLONE ACETONIDE 40 MG/ML IJ SUSP
2.0000 mL | INTRAMUSCULAR | Status: AC | PRN
Start: 2023-02-09 — End: 2023-02-09
  Administered 2023-02-09: 2 mL via INTRA_ARTICULAR

## 2023-02-09 MED ORDER — LIDOCAINE HCL 1 % IJ SOLN
2.0000 mL | INTRAMUSCULAR | Status: AC | PRN
Start: 2023-02-09 — End: 2023-02-09
  Administered 2023-02-09: 2 mL

## 2023-02-09 NOTE — Progress Notes (Signed)
Chief Complaint: Low back pain, Left ankle pain     History of Present Illness:    Francis Lin is a 47 y.o. male presenting to clinic for evaluation of his low back and left ankle.  He states that he has had low back issues for years however this current episode has been ongoing for about 2 weeks.  He states that the pain is moderate in severity, more left-sided, and does radiate down his left leg to his ankle.  This is worsened particularly with long periods in a seated position.  He does report frequent left-sided groin pain as well.  He has been taking Tylenol and Advil with little benefit.  He has had physical therapy for his low back in the past which did little to improve his symptoms.  No previous lumbar MRI.  Also states that his left ankle is painful and is swollen and stiff.  This is located mainly on the medial ankle.  He did have an injury in 2017 resulting in a left ankle fracture as well as a right tib/fib fracture.  The left ankle did not require surgery but he did have a ORIF of the right leg.  Denies any saddle anesthesia or bowel/bladder changes.  Denies any radiating numbness or tingling.  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   Surgical History:   None  PMH/PSH/Family History/Social History/Meds/Allergies:    Past Medical History:  Diagnosis Date   Acid reflux    Allergy    Anxiety    stress    Clotting disorder (HCC)    right leg x 2 in calf and behind knee    DVT (deep venous thrombosis) (HCC)    Esophagitis    Gout    Hypersomnia, persistent    IBS (irritable bowel syndrome)    PONV (postoperative nausea and vomiting)    nausea with no vomiting    Renal disorder    Shifting sleep-work schedule, affecting sleep 06/20/2013   Snoring disorder 06/20/2013   Tubular adenoma of colon    Past Surgical History:  Procedure Laterality Date   COLONOSCOPY     ESOPHAGEAL DILATION     HEMORRHOID BANDING      ORIF TIBIA & FIBULA FRACTURES Right    right leg surgery x 2 places- rod in place knee to ankle    UPPER GASTROINTESTINAL ENDOSCOPY     wisdom teeth     wrist surgery Right    Social History   Socioeconomic History   Marital status: Married    Spouse name: Not on file   Number of children: 3   Years of education: college   Highest education level: Not on file  Occupational History    Employer: [employer]    Comment: [employer]  Tobacco Use   Smoking status: Never    Passive exposure: Past   Smokeless tobacco: Never  Vaping Use   Vaping Use: Never used  Substance and Sexual Activity   Alcohol use: No   Drug use: No   Sexual activity: Not on file  Other Topics Concern   Not on file  Social History Narrative   Right handed   Caffeine use: tea sometimes   Social Determinants of Health   Financial Resource Strain: Not on file  Food Insecurity: Not  on file  Transportation Needs: Not on file  Physical Activity: Not on file  Stress: Not on file  Social Connections: Not on file   Family History  Problem Relation Age of Onset   Lupus Mother    Heart attack Father    Arthritis Father    Hypertension Father    Cancer Father        multiple myeloma   Diabetes Father    Atrial fibrillation Father    Gout Father    Kidney failure Father    Edema Sister    Diabetes Sister    Bipolar disorder Sister    Stroke Sister    Obesity Sister    Adrenal disorder Sister    Lupus Maternal Aunt    Eczema Daughter    ADD / ADHD Daughter    Asthma Daughter    Eczema Daughter    ADD / ADHD Daughter    Hypotension Daughter    Eczema Daughter    High Cholesterol Daughter    Colon cancer Neg Hx    Colon polyps Neg Hx    Esophageal cancer Neg Hx    Rectal cancer Neg Hx    Stomach cancer Neg Hx    Allergies  Allergen Reactions   Peanuts [Peanut Oil] Anaphylaxis   Lactose Diarrhea and Other (See Comments)    Lactose intolerance Lactose intolerance Lactose  intolerance   Allopurinol     rash   Caffeine Other (See Comments)    "makes feel bad", headaches   Gabapentin Other (See Comments)    Messes with memory   Ibuprofen Other (See Comments)    Early onset of kidney issues (Stage 3A)   Omeprazole Other (See Comments)    Mood swing   Penicillins Other (See Comments)    Headaches    Seasonal Ic [Cholestatin]     Sneezing,runny nose , itchy eyes   Current Outpatient Medications  Medication Sig Dispense Refill   acetaminophen (TYLENOL) 325 MG tablet Take 650 mg by mouth every 6 (six) hours as needed for mild pain.     colchicine 0.6 MG tablet Take 0.6 mg 1 tablet by mouth daily. 90 tablet 0   cyanocobalamin 1000 MCG tablet Take 1 tablet (1,000 mcg total) by mouth daily.     cyclobenzaprine (FLEXERIL) 10 MG tablet Take 1 tablet (10 mg total) by mouth 2 (two) times daily as needed for muscle spasms. 20 tablet 0   febuxostat (ULORIC) 40 MG tablet Take 1 tablet (40 mg total) by mouth daily. 30 tablet 2   fluocinolone (VANOS) 0.01 % cream Apply 1 Application topically 2 (two) times daily as needed (Itching).     fluticasone (FLONASE) 50 MCG/ACT nasal spray Place 1 spray into both nostrils daily as needed for allergies or rhinitis.     loratadine (CLARITIN) 10 MG tablet Take 10 mg by mouth daily as needed for allergies.     nystatin cream (MYCOSTATIN) Apply 1 Application topically 2 (two) times daily as needed for dry skin (Itching).     topiramate (TOPAMAX) 25 MG tablet Take 1 tablet (25 mg total) by mouth at bedtime for 7 days, THEN 2 tablets (50 mg total) at bedtime for 21 days. 50 tablet 0   No current facility-administered medications for this visit.   No results found.  Review of Systems:   A ROS was performed including pertinent positives and negatives as documented in the HPI.  Physical Exam :   Constitutional: NAD and appears stated age  Neurological: Alert and oriented Psych: Appropriate affect and cooperative There were no vitals  taken for this visit.   Comprehensive Musculoskeletal Exam:    Patient is ambulating with antalgic gait without assistive device.  Tenderness to palpation over lower lumbar spine and left-sided lumbar region.  Negative straight straight leg raise bilaterally.  Passive range of motion of the left hip to 120 degrees flexion, 30 degrees external rotation, and 10 degrees internal rotation.  Positive FABER of the left hip.  Knee flexion and extension strength 5/5.  Left ankle range of motion 10 degrees dorsiflexion and 20 degrees plantarflexion.  There is swelling noted around the medial malleolus and patient is tender to palpation of this area.  DP/TP pulses 2+.  Equal and intact sensation throughout the foot.  Imaging:   Xray (lumbar spine 4 views): Negative.  Mild degenerative changes but overall well-maintained alignment and intervertebral spacing.  Xray (AP pelvis) Some degenerative changes of bilateral hips with left slightly worse than right.  Bilateral pincer lesions.  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  Large calcaneal spur  I personally reviewed and interpreted the radiographs.   Assessment:   47 y.o. male with left-sided lumbar and hip pain as well as left ankle pain.  Does have left-sided radicular symptoms and has failed previous treatment with physical therapy and pain medications.  At this point I believe further workup is indicated with lumbar MRI.  In the meantime I would like to get him back in for physical therapy to work on his low back and left hip.  He does still appear to have some hip arthritis and possible impingement.  His left ankle appears arthritic, likely as a result of his injury 7 years ago.  Recommend cortisone injection for symptom relief and to assess if further workup is necessary.  Patient is agreeable and ultrasound-guided cortisone injection was performed of the left ankle without complication.  Will plan to see patient  back in clinic in 4 weeks for reassessment and MRI review.  Plan :    - Referral to PT and follow up in 4 weeks for MRI review     Procedure Note  Patient: Francis Lin             Date of Birth: 08/20/1976           MRN: [id number]             Visit Date: 02/09/2023  Procedures: Visit Diagnoses:  1. Acute left-sided low back pain with left-sided sciatica   2. Pain in left ankle and joints of left foot     Medium Joint Inj: L ankle on 02/09/2023 12:00 PM Indications: pain Details: 22 G 1.5 in needle, ultrasound-guided anteromedial approach Medications: 2 mL lidocaine 1 %; 2 mL triamcinolone acetonide 40 MG/ML Outcome: tolerated well, no immediate complications Consent was given by the patient. Patient was prepped and draped in the usual sterile fashion.      I personally saw and evaluated the patient, and participated in the management and treatment plan.  Hazle Nordmann, PA-C Orthopedics  This document was dictated using Conservation officer, historic buildings. A reasonable attempt at proof reading has been made to minimize errors.

## 2023-02-09 NOTE — Progress Notes (Deleted)
Office Visit Note  Patient: Francis Lin             Date of Birth: 22-Feb-1976           MRN: [id number]             PCP: Kristian Covey, MD Referring: Kristian Covey, MD Visit Date: 02/15/2023 Occupation: @GUAROCC @  Subjective:  No chief complaint on file.   History of Present Illness: Francis Lin is a 47 y.o. male ***     Activities of Daily Living:  Patient reports morning stiffness for *** {minute/hour:19697}.   Patient {ACTIONS;DENIES/REPORTS:21021675::"Denies"} nocturnal pain.  Difficulty dressing/grooming: {ACTIONS;DENIES/REPORTS:21021675::"Denies"} Difficulty climbing stairs: {ACTIONS;DENIES/REPORTS:21021675::"Denies"} Difficulty getting out of chair: {ACTIONS;DENIES/REPORTS:21021675::"Denies"} Difficulty using hands for taps, buttons, cutlery, and/or writing: {ACTIONS;DENIES/REPORTS:21021675::"Denies"}  No Rheumatology ROS completed.   PMFS History:  Patient Active Problem List   Diagnosis Date Noted   OSA (obstructive sleep apnea) 06/20/2022   Episodic cluster headache, not intractable 06/20/2022   Sinus congestion 06/20/2022   Morbid obesity (HCC) 06/20/2022   Episode of change in speech    Aphasia 05/10/2022   LUQ abdominal pain 11/03/2020   Generalized abdominal pain 11/03/2020   Altered bowel habits 11/03/2020   Nausea without vomiting 11/03/2020   Migraine without aura and without status migrainosus, not intractable 11/22/2018   Nonsustained ventricular tachycardia (HCC) 08/03/2018   Elevated serum creatinine 06/18/2018   Slow heart rate 06/06/2018   Shortness of breath 04/04/2018   Venous insufficiency 01/05/2017   Deep vein thrombosis (DVT) of popliteal vein of right lower extremity (HCC) 06/30/2016   S/P ORIF (open reduction internal fixation) fracture 06/22/2016   Multiple fractures of both lower extremities 05/31/2016   Elevated CK 05/17/2016   Fracture of right fibula 05/17/2016   Fracture of right tibia 05/17/2016    Fracture of tibia, left, closed 05/17/2016   Trauma 05/17/2016   Situational anxiety 08/28/2014   Unspecified vitamin D deficiency 09/22/2013   Shifting sleep-work schedule, affecting sleep 06/20/2013   Snoring disorder 06/20/2013   Hypersomnia, persistent    History of esophageal stricture 04/16/2013   Pain in joint, ankle and foot 04/16/2013   Gout 04/16/2013   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   OBESITY, CLASS II, BMI 35- 39.9 03/15/2012   Hypertriglyceridemia 03/15/2012   Atypical chest pain 12/02/2008   Dysphagia 12/02/2008    Past Medical History:  Diagnosis Date   Acid reflux    Allergy    Anxiety    stress    Clotting disorder (HCC)    right leg x 2 in calf and behind knee    DVT (deep venous thrombosis) (HCC)    Esophagitis    Gout    Hypersomnia, persistent    IBS (irritable bowel syndrome)    PONV (postoperative nausea and vomiting)    nausea with no vomiting    Renal disorder    Shifting sleep-work schedule, affecting sleep 06/20/2013   Snoring disorder 06/20/2013   Tubular adenoma of colon     Family History  Problem Relation Age of Onset   Lupus Mother    Heart attack Father    Arthritis Father    Hypertension Father    Cancer Father        multiple myeloma   Diabetes Father    Atrial fibrillation Father    Gout Father    Kidney failure Father    Edema Sister    Diabetes Sister    Bipolar disorder Sister    Stroke Sister  Obesity Sister    Adrenal disorder Sister    Lupus Maternal Aunt    Eczema Daughter    ADD / ADHD Daughter    Asthma Daughter    Eczema Daughter    ADD / ADHD Daughter    Hypotension Daughter    Eczema Daughter    High Cholesterol Daughter    Colon cancer Neg Hx    Colon polyps Neg Hx    Esophageal cancer Neg Hx    Rectal cancer Neg Hx    Stomach cancer Neg Hx    Past Surgical History:  Procedure Laterality Date   COLONOSCOPY     ESOPHAGEAL DILATION     HEMORRHOID BANDING     ORIF TIBIA & FIBULA FRACTURES  Right    right leg surgery x 2 places- rod in place knee to ankle    UPPER GASTROINTESTINAL ENDOSCOPY     wisdom teeth     wrist surgery Right    Social History   Social History Narrative   Right handed   Caffeine use: tea sometimes   Immunization History  Administered Date(s) Administered   Influenza,inj,Quad PF,6+ Mos 07/18/2022   PFIZER(Purple Top)SARS-COV-2 Vaccination 12/19/2019, 01/09/2020, 11/09/2020   Tdap 08/19/2011, 05/17/2016     Objective: Vital Signs: There were no vitals taken for this visit.   Physical Exam   Musculoskeletal Exam: ***  CDAI Exam: CDAI Score: -- Patient Global: --; Provider Global: -- Swollen: --; Tender: -- Joint Exam 02/15/2023   No joint exam has been documented for this visit   There is currently no information documented on the homunculus. Go to the Rheumatology activity and complete the homunculus joint exam.  Investigation: No additional findings.  Imaging: No results found.  Recent Labs: Lab Results  Component Value Date   WBC 6.6 11/28/2022   HGB 14.7 11/28/2022   PLT 241 11/28/2022   NA 136 11/28/2022   K 3.5 11/28/2022   CL 105 11/28/2022   CO2 24 11/28/2022   GLUCOSE 84 11/28/2022   BUN 12 11/28/2022   CREATININE 1.32 (H) 11/28/2022   BILITOT 0.8 10/02/2022   ALKPHOS 71 10/02/2022   AST 24 10/02/2022   ALT 29 10/02/2022   PROT 8.0 10/02/2022   ALBUMIN 4.0 10/02/2022   CALCIUM 8.5 (L) 11/28/2022   GFRAA 58 (L) 03/22/2020    Speciality Comments: Allopurinol-rash  Procedures:  No procedures performed Allergies: Peanuts [peanut oil], Lactose, Allopurinol, Caffeine, Gabapentin, Ibuprofen, Omeprazole, Penicillins, and Seasonal ic [cholestatin]   Assessment / Plan:     Visit Diagnoses: No diagnosis found.  Orders: No orders of the defined types were placed in this encounter.  No orders of the defined types were placed in this encounter.   Face-to-face time spent with patient was *** minutes. Greater  than 50% of time was spent in counseling and coordination of care.  Follow-Up Instructions: No follow-ups on file.   Pollyann Savoy, MD  Note - This record has been created using Animal nutritionist.  Chart creation errors have been sought, but may not always  have been located. Such creation errors do not reflect on  the standard of medical care.

## 2023-02-15 ENCOUNTER — Ambulatory Visit: Payer: Managed Care, Other (non HMO) | Admitting: Rheumatology

## 2023-02-15 DIAGNOSIS — G43009 Migraine without aura, not intractable, without status migrainosus: Secondary | ICD-10-CM

## 2023-02-15 DIAGNOSIS — M79672 Pain in left foot: Secondary | ICD-10-CM

## 2023-02-15 DIAGNOSIS — Z8619 Personal history of other infectious and parasitic diseases: Secondary | ICD-10-CM

## 2023-02-15 DIAGNOSIS — F418 Other specified anxiety disorders: Secondary | ICD-10-CM

## 2023-02-15 DIAGNOSIS — R918 Other nonspecific abnormal finding of lung field: Secondary | ICD-10-CM

## 2023-02-15 DIAGNOSIS — M170 Bilateral primary osteoarthritis of knee: Secondary | ICD-10-CM

## 2023-02-15 DIAGNOSIS — Z8781 Personal history of (healed) traumatic fracture: Secondary | ICD-10-CM

## 2023-02-15 DIAGNOSIS — R7989 Other specified abnormal findings of blood chemistry: Secondary | ICD-10-CM

## 2023-02-15 DIAGNOSIS — I4729 Other ventricular tachycardia: Secondary | ICD-10-CM

## 2023-02-15 DIAGNOSIS — Z86718 Personal history of other venous thrombosis and embolism: Secondary | ICD-10-CM

## 2023-02-15 DIAGNOSIS — M19041 Primary osteoarthritis, right hand: Secondary | ICD-10-CM

## 2023-02-15 DIAGNOSIS — Z8719 Personal history of other diseases of the digestive system: Secondary | ICD-10-CM

## 2023-02-15 DIAGNOSIS — M1A00X0 Idiopathic chronic gout, unspecified site, without tophus (tophi): Secondary | ICD-10-CM

## 2023-02-15 DIAGNOSIS — R21 Rash and other nonspecific skin eruption: Secondary | ICD-10-CM

## 2023-02-15 DIAGNOSIS — G4710 Hypersomnia, unspecified: Secondary | ICD-10-CM

## 2023-02-17 ENCOUNTER — Ambulatory Visit
Admission: RE | Admit: 2023-02-17 | Discharge: 2023-02-17 | Disposition: A | Discharge status: Home / Self Care | Payer: Managed Care, Other (non HMO) | Source: Ambulatory Visit | Attending: Adult Health | Admitting: Adult Health

## 2023-02-17 DIAGNOSIS — E041 Nontoxic single thyroid nodule: Secondary | ICD-10-CM

## 2023-02-21 ENCOUNTER — Other Ambulatory Visit: Payer: Self-pay

## 2023-02-21 DIAGNOSIS — E049 Nontoxic goiter, unspecified: Secondary | ICD-10-CM

## 2023-02-22 ENCOUNTER — Encounter: Payer: Self-pay | Admitting: Family Medicine

## 2023-02-23 NOTE — Progress Notes (Signed)
Office Visit Note  Patient: Francis Lin             Date of Birth: [birth year]           MRN: [id number]             PCP: Kristian Covey, MD Referring: Kristian Covey, MD Visit Date: 03/09/2023 Occupation: @GUAROCC @  Subjective:  Medication monitoring   History of Present Illness: Francis Lin is a 47 y.o. male with history of gout and osteoarthritis.  Patient is prescribed uloric 40 mg 1 tablet by mouth daily.  Patient states that he has not been taking Uloric consistently and states his last dose was about 1 month ago.  He states that he has been trying to take colchicine 0.6 mg half tablet by mouth daily.  He denies any recent gout flares. Patient states he continues to have ongoing discomfort in his lower back.  He states that he has had the initial consultation for physical therapy and will be starting his sessions in early June.  He states that he continues to have intermittent discomfort in the left ankle joint.  He had a recent left ankle joint injection performed by orthopedics who felt that his symptoms were due to arthritis.  The injection has helped with his mobility. Patient states that he is currently awaiting a referral to endocrinology for further evaluation of an enlarged thyroid gland.   Activities of Daily Living:  Patient reports morning stiffness for 0 minutes.   Patient Reports nocturnal pain.  Difficulty dressing/grooming: Denies Difficulty climbing stairs: Reports Difficulty getting out of chair: Reports Difficulty using hands for taps, buttons, cutlery, and/or writing: Denies  Review of Systems  Constitutional:  Positive for fatigue.  HENT:  Negative for mouth sores and mouth dryness.   Eyes:  Negative for dryness.  Respiratory:  Positive for shortness of breath.        With exertion   Cardiovascular:  Negative for chest pain and palpitations.  Gastrointestinal:  Negative for blood in stool, constipation and diarrhea.  Endocrine: Negative  for increased urination.  Genitourinary:  Negative for involuntary urination.  Musculoskeletal:  Positive for joint pain, gait problem, joint pain and joint swelling. Negative for myalgias, muscle weakness, morning stiffness, muscle tenderness and myalgias.  Skin:  Negative for color change, rash and sensitivity to sunlight.  Allergic/Immunologic: Negative for susceptible to infections.  Neurological:  Positive for dizziness and headaches.  Hematological:  Negative for swollen glands.  Psychiatric/Behavioral:  Negative for depressed mood and sleep disturbance. The patient is not nervous/anxious.     PMFS History:  Patient Active Problem List   Diagnosis Date Noted   OSA (obstructive sleep apnea) 06/20/2022   Episodic cluster headache, not intractable 06/20/2022   Sinus congestion 06/20/2022   Morbid obesity (HCC) 06/20/2022   Episode of change in speech    Aphasia 05/10/2022   LUQ abdominal pain 11/03/2020   Generalized abdominal pain 11/03/2020   Altered bowel habits 11/03/2020   Nausea without vomiting 11/03/2020   Migraine without aura and without status migrainosus, not intractable 11/22/2018   Nonsustained ventricular tachycardia (HCC) 08/03/2018   Elevated serum creatinine 06/18/2018   Slow heart rate 06/06/2018   Shortness of breath 04/04/2018   Venous insufficiency 01/05/2017   Deep vein thrombosis (DVT) of popliteal vein of right lower extremity (HCC) 06/30/2016   S/P ORIF (open reduction internal fixation) fracture 06/22/2016   Multiple fractures of both lower extremities 05/31/2016   Elevated CK 05/17/2016  Fracture of right fibula 05/17/2016   Fracture of right tibia 05/17/2016   Fracture of tibia, left, closed 05/17/2016   Trauma 05/17/2016   Situational anxiety 08/28/2014   Unspecified vitamin D deficiency 09/22/2013   Shifting sleep-work schedule, affecting sleep 06/20/2013   Snoring disorder 06/20/2013   Hypersomnia, persistent    History of esophageal  stricture 04/16/2013   Pain in joint, ankle and foot 04/16/2013   Gout 04/16/2013   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   OBESITY, CLASS II, BMI 35- 39.9 03/15/2012   Hypertriglyceridemia 03/15/2012   Atypical chest pain 12/02/2008   Dysphagia 12/02/2008    Past Medical History:  Diagnosis Date   Acid reflux    Allergy    Anxiety    stress    Clotting disorder (HCC)    right leg x 2 in calf and behind knee    DVT (deep venous thrombosis) (HCC)    Esophagitis    Gout    Hypersomnia, persistent    IBS (irritable bowel syndrome)    PONV (postoperative nausea and vomiting)    nausea with no vomiting    Renal disorder    Shifting sleep-work schedule, affecting sleep 06/20/2013   Snoring disorder 06/20/2013   Tubular adenoma of colon     Family History  Problem Relation Age of Onset   Lupus Mother    Heart attack Father    Arthritis Father    Hypertension Father    Cancer Father        multiple myeloma   Diabetes Father    Atrial fibrillation Father    Gout Father    Kidney failure Father    Edema Sister    Diabetes Sister    Bipolar disorder Sister    Stroke Sister    Obesity Sister    Adrenal disorder Sister    Lupus Maternal Aunt    Eczema Daughter    ADD / ADHD Daughter    Asthma Daughter    Eczema Daughter    ADD / ADHD Daughter    Hypotension Daughter    Eczema Daughter    High Cholesterol Daughter    Colon cancer Neg Hx    Colon polyps Neg Hx    Esophageal cancer Neg Hx    Rectal cancer Neg Hx    Stomach cancer Neg Hx    Past Surgical History:  Procedure Laterality Date   COLONOSCOPY     ESOPHAGEAL DILATION     HEMORRHOID BANDING     ORIF TIBIA & FIBULA FRACTURES Right    right leg surgery x 2 places- rod in place knee to ankle    UPPER GASTROINTESTINAL ENDOSCOPY     wisdom teeth     wrist surgery Right    Social History   Social History Narrative   Right handed   Caffeine use: tea sometimes   Immunization History  Administered Date(s)  Administered   Influenza,inj,Quad PF,6+ Mos 07/18/2022   PFIZER(Purple Top)SARS-COV-2 Vaccination 12/19/2019, 01/09/2020, 11/09/2020   Tdap 08/19/2011, 05/17/2016     Objective: Vital Signs: BP 128/87 (BP Location: Left Arm, Patient Position: Sitting, Cuff Size: Large)   Pulse 68   Resp 18   Ht 6' 2.5" (1.892 m)   Wt (!) 330 lb 3.2 oz (149.8 kg)   BMI 41.83 kg/m    Physical Exam Vitals and nursing note reviewed.  Constitutional:      Appearance: He is well-developed.  HENT:     Head: Normocephalic and atraumatic.  Eyes:  Conjunctiva/sclera: Conjunctivae normal.     Pupils: Pupils are equal, round, and reactive to light.  Cardiovascular:     Rate and Rhythm: Normal rate and regular rhythm.     Heart sounds: Normal heart sounds.  Pulmonary:     Effort: Pulmonary effort is normal.     Breath sounds: Normal breath sounds.  Abdominal:     General: Bowel sounds are normal.     Palpations: Abdomen is soft.  Musculoskeletal:     Cervical back: Normal range of motion and neck supple.  Skin:    General: Skin is warm and dry.     Capillary Refill: Capillary refill takes less than 2 seconds.  Neurological:     Mental Status: He is alert and oriented to person, place, and time.  Psychiatric:        Behavior: Behavior normal.      Musculoskeletal Exam: C-spine has good range of motion.  Limited mobility of the lumbar spine.  Shoulder joints, elbow joints, wrist joints, MCPs, PIPs, DIPs have good range of motion with no synovitis.  Complete fist formation bilaterally.  Hip joints have good range of motion with some discomfort bilaterally.  Knee joints have good range of motion with no warmth or effusion.  Tenderness over the left ankle.   CDAI Exam: CDAI Score: -- Patient Global: --; Provider Global: -- Swollen: --; Tender: -- Joint Exam 03/09/2023   No joint exam has been documented for this visit   There is currently no information documented on the homunculus. Go to the  Rheumatology activity and complete the homunculus joint exam.  Investigation: No additional findings.  Imaging: CT Head Wo Contrast  Result Date: 02/28/2023 CLINICAL DATA:  Worsening headache. EXAM: CT HEAD WITHOUT CONTRAST TECHNIQUE: Contiguous axial images were obtained from the base of the skull through the vertex without intravenous contrast. RADIATION DOSE REDUCTION: This exam was performed according to the departmental dose-optimization program which includes automated exposure control, adjustment of the mA and/or kV according to patient size and/or use of iterative reconstruction technique. COMPARISON:  May 09, 2022 FINDINGS: Brain: No evidence of acute infarction, hemorrhage, hydrocephalus, extra-axial collection or mass lesion/mass effect. Vascular: No hyperdense vessel or unexpected calcification. Skull: Normal. Negative for fracture or focal lesion. Sinuses/Orbits: No acute finding. Other: None. IMPRESSION: No acute intracranial pathology. Electronically Signed   By: Aram Candela M.D.   On: 02/28/2023 17:34   DG Chest Port 1 View  Result Date: 02/28/2023 CLINICAL DATA:  Chest discomfort since this morning with nausea and migraine. EXAM: PORTABLE CHEST 1 VIEW COMPARISON:  CT 12/27/2022.  Radiographs 11/28/2022 and 05/10/2022. FINDINGS: 1633 hours. The heart size and mediastinal contours are stable. The lungs are clear. There is no pleural effusion or pneumothorax. No acute osseous findings are identified. No acute osseous findings are evident. Telemetry leads overlie the chest. IMPRESSION: Stable chest. No evidence of acute cardiopulmonary process. Electronically Signed   By: Carey Bullocks M.D.   On: 02/28/2023 16:46   US THYROID  Result Date: 02/19/2023 CLINICAL DATA:  Incidental on CT. Right-sided thyroid nodule demonstrated on recent chest CT EXAM: THYROID ULTRASOUND TECHNIQUE: Ultrasound examination of the thyroid gland and adjacent soft tissues was performed. COMPARISON:  Chest  CT-12/27/2022 FINDINGS: Parenchymal Echotexture: Normal Isthmus: Enlarged measuring 1.1 cm in diameter Right lobe: Enlarged measuring 6.4 x 2.5 x 2.3 cm Left lobe: Enlarged measuring 6.2 x 2.5 x 2.2 cm _________________________________________________________ Estimated total number of nodules >/= 1 cm: 2 Number of spongiform nodules >/=  2 cm not described below (TR1): 0 Number of mixed cystic and solid nodules >/= 1.5 cm not described below (TR2): 0 _________________________________________________________ Nodule # 1: Location: Isthmus; Mid - correlates with the nodule questioned on preceding chest CT Maximum size: 2.0 cm; Other 2 dimensions: 1.4 x 1.1 cm Composition: solid/almost completely solid (2) Echogenicity: isoechoic (1) Shape: not taller-than-wide (0) Margins: ill-defined (0) Echogenic foci: none (0) ACR TI-RADS total points: 3. ACR TI-RADS risk category: TR3 (3 points). ACR TI-RADS recommendations: *Given size (>/= 1.5 - 2.4 cm) and appearance, a follow-up ultrasound in 1 year should be considered based on TI-RADS criteria. _________________________________________________________ Nodule # 2: Location: Right; Mid Maximum size: 2.0 cm; Other 2 dimensions: 1.5 x 1.1 cm Composition: mixed cystic and solid (1) Echogenicity: isoechoic (1) Shape: not taller-than-wide (0) Margins: ill-defined (0) Echogenic foci: none (0) ACR TI-RADS total points: 2. ACR TI-RADS risk category: TR2 (2 points). ACR TI-RADS recommendations: This nodule does NOT meet TI-RADS criteria for biopsy or dedicated follow-up. _________________________________________________________ IMPRESSION: 1. Thyromegaly with findings suggestive of multinodular goiter. 2. Nodule labeled #1 which correlates with the nodule questioned on preceding chest CT, meets imaging criteria to recommend a 1 year follow-up. 3. Solitary additional right-sided mixed cystic and solid nodule does not meet criteria to recommend dedicated follow-up. The above is in keeping  with the ACR TI-RADS recommendations - J Am Coll Radiol 2017;14:587-595. Electronically Signed   By: Simonne Come M.D.   On: 02/19/2023 09:57   DG Ankle Complete Left  Result Date: 02/13/2023 CLINICAL DATA:  Pain in left ankle and joints of left foot. Patient reports fracture a few years ago. EXAM: LEFT ANKLE COMPLETE - 3+ VIEW COMPARISON:  Radiograph 10/26/2017 FINDINGS: Lucency involving the medial talar dome without subchondral sclerosis or collapse. The ankle mortise is preserved. There is tibial talar spurring. No acute fracture. Moderate-sized plantar calcaneal spur and Achilles tendon enthesophyte. There may be a minimal ankle joint effusion. No erosive change. Question of mild subcutaneous edema. IMPRESSION: 1. Lucency involving the medial talar dome may represent an osteochondral lesion. No evidence of subchondral sclerosis or collapse. This could be further assessed with MRI as clinically indicated. 2. Mild tibial talar spurring. 3. Moderate-sized plantar calcaneal spur and Achilles tendon enthesophyte. Electronically Signed   By: Narda Rutherford M.D.   On: 02/13/2023 12:52   DG Lumbar Spine Complete  Result Date: 02/13/2023 CLINICAL DATA:  Acute left-sided low back pain with left-sided sciatica. EXAM: LUMBAR SPINE - COMPLETE 4+ VIEW COMPARISON:  CT reformats from abdominopelvic CT 10/02/2022 FINDINGS: There are 5 non-rib-bearing lumbar vertebra. Mild broad-based dextroscoliotic curvature centered at L4-L5. Trace retrolisthesis of L2 on L3 of 3 mm, unchanged on flexion and extension. Normal vertebral body heights. Anterior spurring at multiple levels. Mild L2-L3 disc space narrowing. Mild L4-L5 facet hypertrophy. Normal vertebral body heights. No evidence of fracture or focal bone abnormality. IMPRESSION: 1. Mild broad-based dextroscoliotic curvature centered at L4-L5. 2. Trace retrolisthesis of L2 on L3, unchanged on flexion and extension. 3. Mild L2-L3 degenerative disc disease. Mild L4-L5 facet  hypertrophy. Electronically Signed   By: Narda Rutherford M.D.   On: 02/13/2023 12:49   DG Pelvis 1-2 Views  Result Date: 02/13/2023 CLINICAL DATA:  Pain. Chronic left-sided low back pain. EXAM: PELVIS - 1-2 VIEW COMPARISON:  Reformats from abdominopelvic CT 10/02/2022 FINDINGS: Bilateral hip joint space narrowing with acetabular spurring, right greater than left. Pubic symphysis and sacroiliac joints are congruent. There is no evidence of fracture, focal bone abnormality or  bony destructive change. No focal soft tissue abnormalities. IMPRESSION: Bilateral hip osteoarthritis, right greater than left. Electronically Signed   By: Narda Rutherford M.D.   On: 02/13/2023 12:46    Recent Labs: Lab Results  Component Value Date   WBC 9.1 02/28/2023   HGB 15.0 02/28/2023   PLT 233 02/28/2023   NA 137 02/28/2023   K 4.1 02/28/2023   CL 104 02/28/2023   CO2 23 02/28/2023   GLUCOSE 101 (H) 02/28/2023   BUN 13 02/28/2023   CREATININE 1.32 (H) 02/28/2023   BILITOT 0.8 10/02/2022   ALKPHOS 71 10/02/2022   AST 24 10/02/2022   ALT 29 10/02/2022   PROT 8.0 10/02/2022   ALBUMIN 4.0 10/02/2022   CALCIUM 9.0 02/28/2023   GFRAA 58 (L) 03/22/2020    Speciality Comments: Allopurinol-rash  Procedures:  No procedures performed Allergies: Peanuts [peanut oil], Lactose, Allopurinol, Caffeine, Gabapentin, Ibuprofen, Omeprazole, Penicillins, Seasonal ic [cholestatin], and Tape   Uric acid 7.7 on 01/26/23.  CBC and BMP updated on 02/28/23.  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  He has not been taking Uloric consistently and states that his last dose was about 1 month ago.  He has been taking colchicine 0.6 mg half tablet by mouth daily.  Discussed the importance of taking Uloric as prescribed.  Uric acid was 7.7 on 01/26/2023.  Discussed that ideally his uric acid level should remain  less than 6.  He does not need any refills of Uloric or colchicine at this time.  He was advised to notify us if he develops signs or symptoms of a gout flare.  He will follow-up in the office in 6 months or sooner if needed.  Primary osteoarthritis of both hands: No tenderness or inflammation noted on exam.  Complete fist formation bilaterally.  Primary osteoarthritis of both knees - Bilateral moderate osteoarthritis and mild chondromalacia patella.  Good range of motion of both knee joints on examination today.  No warmth or effusion noted.  Pain in both feet: Under the care of orthopedics.  Patient recently had a left ankle cortisone injection which has improved his mobility.  He has some tenderness along the left ankle joint on examination today.  S/P ORIF (open reduction internal fixation) fracture - 2017  Elevated serum creatinine: Under the care of [facility].  Discussed the importance of avoiding NSAID use.  Creatinine 1.32 and GFR was greater than 60 on 02/28/2023.  Other medical conditions are listed as follows  Multiple pulmonary nodules - Patient was evaluated by Dr. Isaiah Serge.  He had elevated ACE level but CT scan not typical for sarcoidosis.  Nodules resolved on the follow-up scan.  History of fibula fracture  History of fracture of tibia  History of Clostridioides difficile colitis  Nonsustained ventricular tachycardia (HCC)  Venous insufficiency  History of DVT (deep vein thrombosis) - 2017-after crush injury  History of esophageal stricture  Situational anxiety  Hypersomnia, persistent  Migraine without aura and without status migrainosus, not intractable  Class 3 severe obesity with body mass index (BMI) of 40.0 to 44.9 in adult, unspecified obesity type, unspecified whether serious comorbidity present (HCC)  Hypertriglyceridemia  Orders: No orders of the defined types were placed in this encounter.  No orders of the defined types were placed in this  encounter.    Follow-Up Instructions: Return in about 6 months (around  09/09/2023) for Gout, Osteoarthritis.   Gearldine Bienenstock, PA-C  Note - This record has been created using Dragon software.  Chart creation errors have been sought, but may not always  have been located. Such creation errors do not reflect on  the standard of medical care.

## 2023-02-27 ENCOUNTER — Encounter (HOSPITAL_BASED_OUTPATIENT_CLINIC_OR_DEPARTMENT_OTHER): Payer: Self-pay

## 2023-02-28 ENCOUNTER — Emergency Department (HOSPITAL_BASED_OUTPATIENT_CLINIC_OR_DEPARTMENT_OTHER)
Admission: EM | Admit: 2023-02-28 | Discharge: 2023-02-28 | Disposition: A | Discharge status: Home / Self Care | Payer: Managed Care, Other (non HMO) | Attending: Emergency Medicine | Admitting: Emergency Medicine

## 2023-02-28 ENCOUNTER — Encounter (HOSPITAL_BASED_OUTPATIENT_CLINIC_OR_DEPARTMENT_OTHER): Payer: Self-pay

## 2023-02-28 ENCOUNTER — Other Ambulatory Visit: Payer: Self-pay

## 2023-02-28 ENCOUNTER — Emergency Department (HOSPITAL_BASED_OUTPATIENT_CLINIC_OR_DEPARTMENT_OTHER): Payer: Managed Care, Other (non HMO) | Admitting: Radiology

## 2023-02-28 ENCOUNTER — Emergency Department (HOSPITAL_BASED_OUTPATIENT_CLINIC_OR_DEPARTMENT_OTHER): Payer: Managed Care, Other (non HMO)

## 2023-02-28 DIAGNOSIS — R0789 Other chest pain: Secondary | ICD-10-CM | POA: Diagnosis not present

## 2023-02-28 DIAGNOSIS — R519 Headache, unspecified: Secondary | ICD-10-CM | POA: Diagnosis not present

## 2023-02-28 DIAGNOSIS — Z79899 Other long term (current) drug therapy: Secondary | ICD-10-CM | POA: Insufficient documentation

## 2023-02-28 DIAGNOSIS — N189 Chronic kidney disease, unspecified: Secondary | ICD-10-CM | POA: Diagnosis not present

## 2023-02-28 DIAGNOSIS — Z91010 Allergy to peanuts: Secondary | ICD-10-CM | POA: Insufficient documentation

## 2023-02-28 DIAGNOSIS — I129 Hypertensive chronic kidney disease with stage 1 through stage 4 chronic kidney disease, or unspecified chronic kidney disease: Secondary | ICD-10-CM | POA: Insufficient documentation

## 2023-02-28 DIAGNOSIS — R079 Chest pain, unspecified: Secondary | ICD-10-CM | POA: Diagnosis present

## 2023-02-28 LAB — CBC
HCT: 44.5 % (ref 39.0–52.0)
Hemoglobin: 15 g/dL (ref 13.0–17.0)
MCH: 29.8 pg (ref 26.0–34.0)
MCHC: 33.7 g/dL (ref 30.0–36.0)
MCV: 88.3 fL (ref 80.0–100.0)
Platelets: 233 10*3/uL (ref 150–400)
RBC: 5.04 MIL/uL (ref 4.22–5.81)
RDW: 14.6 % (ref 11.5–15.5)
WBC: 9.1 10*3/uL (ref 4.0–10.5)
nRBC: 0 % (ref 0.0–0.2)

## 2023-02-28 LAB — BASIC METABOLIC PANEL
Anion gap: 10 (ref 5–15)
BUN: 13 mg/dL (ref 6–20)
CO2: 23 mmol/L (ref 22–32)
Calcium: 9 mg/dL (ref 8.9–10.3)
Chloride: 104 mmol/L (ref 98–111)
Creatinine, Ser: 1.32 mg/dL — ABNORMAL HIGH (ref 0.61–1.24)
GFR, Estimated: >60 mL/min (ref >60)
Glucose, Bld: 101 mg/dL — ABNORMAL HIGH (ref 70–99)
Potassium: 4.1 mmol/L (ref 3.5–5.1)
Sodium: 137 mmol/L (ref 135–145)

## 2023-02-28 LAB — TROPONIN I (HIGH SENSITIVITY): Troponin I (High Sensitivity): <2 ng/L (ref <18)

## 2023-02-28 MED ORDER — SODIUM CHLORIDE 0.9 % IV BOLUS
1000.0000 mL | Freq: Once | INTRAVENOUS | Status: AC
  Administered 2023-02-28: 1000 mL via INTRAVENOUS

## 2023-02-28 MED ORDER — ACETAMINOPHEN 500 MG PO TABS
1000.0000 mg | ORAL_TABLET | Freq: Once | ORAL | Status: AC
  Administered 2023-02-28: 1000 mg via ORAL
  Filled 2023-02-28: qty 2

## 2023-02-28 MED ORDER — METOCLOPRAMIDE HCL 5 MG/ML IJ SOLN
10.0000 mg | Freq: Once | INTRAMUSCULAR | Status: AC
  Administered 2023-02-28: 10 mg via INTRAVENOUS
  Filled 2023-02-28: qty 2

## 2023-02-28 NOTE — ED Notes (Signed)
RN reviewed discharge instructions with pt. Pt verbalized understanding and had no further questions. VSS upon discharge.  

## 2023-02-28 NOTE — ED Provider Notes (Signed)
[place] EMERGENCY DEPARTMENT AT [facility] Provider Note   CSN: [id number] Arrival date & time: 02/28/23  1601     History  Chief Complaint  Patient presents with   Chest Pain    Francis Lin is a 47 y.o. male.  With a history of anxiety, depression, gout, IBS, hypertension, CKD who presents to the ED for evaluation of an episode of chest pain, disorientation and migraine.  He states he works [employer].  When he got home at approximately 7 AM he became suddenly disoriented and developed substernal chest pain which eventually moved to the left upper chest.  States he was confused and "out of it."  This lasted approximately 15 minutes.  His disorientation and chest pain then resolved.  His left-sided headache began shortly afterwards.  Has a history of migraines and states it feels similar to his migraines he has had in the past, however the disorientation scared him.  He takes Topamax intermittently for the migraines but does not take it as prescribed.  Headache is localized to the left side of the head.  He reports associated photosensitivity and nausea.  No vomiting, fevers, neck stiffness, vision changes, numbness, weakness, tingling.  Currently does not have any chest pain or shortness of breath.  No cough/hemoptysis, unilateral leg swelling, recent cancer treatment, surgery, prolonged travel, estrogen use.   Chest Pain Associated symptoms: headache        Home Medications Prior to Admission medications   Medication Sig Start Date End Date Taking? Authorizing Provider  acetaminophen (TYLENOL) 325 MG tablet Take 650 mg by mouth every 6 (six) hours as needed for mild pain.    [provider]  colchicine 0.6 MG tablet Take 0.6 mg 1 tablet by mouth daily. 10/07/22   Gearldine Bienenstock, PA-C  cyanocobalamin 1000 MCG tablet Take 1 tablet (1,000 mcg total) by mouth daily. 05/13/22   Lonia Blood, MD  cyclobenzaprine (FLEXERIL) 10 MG tablet Take 1 tablet (10 mg  total) by mouth 2 (two) times daily as needed for muscle spasms. 06/01/21   Milagros Loll, MD  febuxostat (ULORIC) 40 MG tablet Take 1 tablet (40 mg total) by mouth daily. 10/07/22   Gearldine Bienenstock, PA-C  fluocinolone (VANOS) 0.01 % cream Apply 1 Application topically 2 (two) times daily as needed (Itching).    [provider]  fluticasone (FLONASE) 50 MCG/ACT nasal spray Place 1 spray into both nostrils daily as needed for allergies or rhinitis.    [provider]  loratadine (CLARITIN) 10 MG tablet Take 10 mg by mouth daily as needed for allergies.    [provider]  nystatin cream (MYCOSTATIN) Apply 1 Application topically 2 (two) times daily as needed for dry skin (Itching).    [provider]  topiramate (TOPAMAX) 25 MG tablet Take 1 tablet (25 mg total) by mouth at bedtime for 7 days, THEN 2 tablets (50 mg total) at bedtime for 21 days. 12/12/22 01/09/23  Ihor Austin, NP  cimetidine (TAGAMET) 200 MG tablet Take by mouth. 01/30/19 01/16/20  [provider]      Allergies    Peanuts [peanut oil], Lactose, Allopurinol, Caffeine, Gabapentin, Ibuprofen, Omeprazole, Penicillins, and Seasonal ic [cholestatin]    Review of Systems   Review of Systems  Cardiovascular:  Positive for chest pain.  Neurological:  Positive for headaches.  All other systems reviewed and are negative.   Physical Exam Updated Vital Signs BP 137/73   Pulse 71   Temp Marland Kitchen)  96.5 F (35.8 C)   Resp 14   Ht 6\' 3"  (1.905 m)   Wt (!) 152 kg   SpO2 100%   BMI 41.87 kg/m  Physical Exam Vitals and nursing note reviewed.  Constitutional:      General: He is not in acute distress.    Appearance: He is well-developed. He is not ill-appearing, toxic-appearing or diaphoretic.     Comments: Resting comfortably in bed  HENT:     Head: Normocephalic and atraumatic.  Eyes:     Conjunctiva/sclera: Conjunctivae normal.  Cardiovascular:     Rate and Rhythm: Normal rate and  regular rhythm.     Heart sounds: No murmur heard. Pulmonary:     Effort: Pulmonary effort is normal. No respiratory distress.     Breath sounds: Normal breath sounds. No decreased breath sounds, wheezing, rhonchi or rales.  Abdominal:     Palpations: Abdomen is soft.     Tenderness: There is no abdominal tenderness.  Musculoskeletal:        General: No swelling.     Cervical back: Neck supple.     Right lower leg: No edema.     Left lower leg: No edema.  Skin:    General: Skin is warm and dry.     Capillary Refill: Capillary refill takes less than 2 seconds.  Neurological:     General: No focal deficit present.     Mental Status: He is alert and oriented to person, place, and time.     Comments:   MENTAL STATUS: AAOx3   LANG/SPEECH: Fluent, intact naming, repetition & comprehension   CRANIAL NERVES:   II: Pupils equal and reactive   III, IV, VI: EOM intact, no gaze preference or deviation, no nystagmus   V: normal sensation of the face   VII: no facial asymmetry   VIII: normal hearing to speech   MOTOR: 5/5 in both upper and lower extremities   SENSORY: Normal to touch in all extremiteis   COORD: Normal finger to nose, heel to shin and shoulder shrug, no tremor, no dysmetria. No pronator drift   Psychiatric:        Mood and Affect: Mood normal.        Behavior: Behavior normal.     ED Results / Procedures / Treatments   Labs (all labs ordered are listed, but only abnormal results are displayed) Labs Reviewed  BASIC METABOLIC PANEL - Abnormal; Notable for the following components:      Result Value   Glucose, Bld 101 (*)    Creatinine, Ser 1.32 (*)    All other components within normal limits  CBC  TROPONIN I (HIGH SENSITIVITY)  TROPONIN I (HIGH SENSITIVITY)    EKG EKG Interpretation  Date/Time:  Tuesday Feb 28 2023 16:10:20 EDT Ventricular Rate:  81 PR Interval:  148 QRS Duration: 98 QT Interval:  376 QTC Calculation: 436 R Axis:   22 Text  Interpretation: Normal sinus rhythm Normal ECG When compared with ECG of 28-Nov-2022 14:07, PREVIOUS ECG IS PRESENT Confirmed by Virgina Norfolk ([id number]) on 02/28/2023 4:19:55 PM  Radiology CT Head Wo Contrast  Result Date: 02/28/2023 CLINICAL DATA:  Worsening headache. EXAM: CT HEAD WITHOUT CONTRAST TECHNIQUE: Contiguous axial images were obtained from the base of the skull through the vertex without intravenous contrast. RADIATION DOSE REDUCTION: This exam was performed according to the departmental dose-optimization program which includes automated exposure control, adjustment of the mA and/or kV according to patient size and/or use of iterative  reconstruction technique. COMPARISON:  May 09, 2022 FINDINGS: Brain: No evidence of acute infarction, hemorrhage, hydrocephalus, extra-axial collection or mass lesion/mass effect. Vascular: No hyperdense vessel or unexpected calcification. Skull: Normal. Negative for fracture or focal lesion. Sinuses/Orbits: No acute finding. Other: None. IMPRESSION: No acute intracranial pathology. Electronically Signed   By: Aram Candela M.D.   On: 02/28/2023 17:34   DG Chest Port 1 View  Result Date: 02/28/2023 CLINICAL DATA:  Chest discomfort since this morning with nausea and migraine. EXAM: PORTABLE CHEST 1 VIEW COMPARISON:  CT 12/27/2022.  Radiographs 11/28/2022 and 05/10/2022. FINDINGS: 1633 hours. The heart size and mediastinal contours are stable. The lungs are clear. There is no pleural effusion or pneumothorax. No acute osseous findings are identified. No acute osseous findings are evident. Telemetry leads overlie the chest. IMPRESSION: Stable chest. No evidence of acute cardiopulmonary process. Electronically Signed   By: Carey Bullocks M.D.   On: 02/28/2023 16:46    Procedures Procedures    Medications Ordered in ED Medications  sodium chloride 0.9 % bolus 1,000 mL (1,000 mLs Intravenous New Bag/Given 02/28/23 1739)  acetaminophen (TYLENOL) tablet 1,000 mg  (1,000 mg Oral Given 02/28/23 1720)  metoCLOPramide (REGLAN) injection 10 mg (10 mg Intravenous Given 02/28/23 1721)    ED Course/ Medical Decision Making/ A&P Clinical Course as of 02/28/23 1916  Tue Feb 28, 2023  1816 Headache is significantly improved after treatment in the ED. [AS]    Clinical Course User Index [AS] Michelle Piper, PA-C             HEART Score: 3                Medical Decision Making Amount and/or Complexity of Data Reviewed Labs: ordered. Radiology: ordered.  Risk OTC drugs. Prescription drug management.  This patient presents to the ED for concern of headache, disorientation, chest pain, this involves an extensive number of treatment options, and is a complaint that carries with it a high risk of complications and morbidity.  Emergent considerations for headache include subarachnoid hemorrhage, meningitis, temporal arteritis, glaucoma, cerebral ischemia, carotid/vertebral dissection, intracranial tumor, Venous sinus thrombosis, carbon monoxide poisoning, acute or chronic subdural hemorrhage.  Other considerations include: Migraine, Cluster headache, Hypertension, Caffeine, alcohol, or drug withdrawal, Pseudotumor cerebri, Arteriovenous malformation, Head injury, Neurocysticercosis, Post-lumbar puncture, Preeclampsia, Tension headache, Sinusitis, Cervical arthritis, Refractive error causing strain, Dental abscess, Otitis media, Temporomandibular joint syndrome, Depression, Somatoform disorder (eg, somatization) Trigeminal neuralgia, Glossopharyngeal neuralgia.   Co morbidities that complicate the patient evaluation  anxiety, depression, gout, IBS, hypertension, CKD  My initial workup includes Labs, imaging, symptom control  Additional history obtained from: Nursing notes from this visit.  I ordered, reviewed and interpreted labs which include: CBC, BMP, troponin, delta troponin.  Creatinine elevated to 1.32 which is near his baseline.  Labs otherwise  reassuring  I ordered imaging studies including chest x-ray, CT head I independently visualized and interpreted imaging which showed normal chest x-ray and CT head I agree with the radiologist interpretation  Cardiac Monitoring:  The patient was maintained on a cardiac monitor.  I personally viewed and interpreted the cardiac monitored which showed an underlying rhythm of: NSR  Afebrile, hemodynamically stable.  47 year old male presenting to the ED for evaluation of left-sided headache described as a migraine.  This morning he had some chest pain and disorientation.  His EKG is without ischemic changes.  Chest x-ray normal.  Troponin reassuring.  Heart score 3 and I have low suspicion for ACS as  the cause of his symptoms.  His physical exam including neurologic exam is reassuring.  No focal neurologic deficits.  Patient did have a similar presentation including disorientation on 05/09/2022.  Had an extensive workup including MRI and long-term EEG which showed no obvious abnormalities.  He does have a history of migraines and was prescribed Topamax but is not taking this consistently.  His symptoms most consistent with an atypical migraine.  Low suspicion for acute intracranial abnormalities as the cause of his symptoms.  He reported significant improvement after treatment in the ED.  He has a follow-up appointment with neurology in 10 days.  He was encouraged to keep this appointment.  He was given return precautions.  Stable discharge.  At this time there does not appear to be any evidence of an acute emergency medical condition and the patient appears stable for discharge with appropriate outpatient follow up. Diagnosis was discussed with patient who verbalizes understanding of care plan and is agreeable to discharge. I have discussed return precautions with patient who verbalizes understanding. Patient encouraged to follow-up with their PCP within 1 week. All questions answered.  Patient's case  discussed with Dr. Lockie Mola who agrees with plan to discharge with follow-up.   Note: Portions of this report may have been transcribed using voice recognition software. Every effort was made to ensure accuracy; however, inadvertent computerized transcription errors may still be present.        Final Clinical Impression(s) / ED Diagnoses Final diagnoses:  Bad headache  Atypical chest pain    Rx / DC Orders ED Discharge Orders     None         Michelle Piper, Cordelia Poche 02/28/23 1916    Virgina Norfolk, DO 02/28/23 2019

## 2023-02-28 NOTE — ED Notes (Signed)
Patient transported to CT 

## 2023-02-28 NOTE — Discharge Instructions (Signed)
You have been seen today for your complaint of headache, chest pain. Your lab work was reassuring and showed no abnormalities. Your imaging was reassuring and showed no abnormalities. Your discharge medications include take your Topamax as prescribed. Follow up with: Neurology as scheduled Please seek immediate medical care if you develop any of the following symptoms: Your migraine headache becomes severe or lasts more than 72 hours. You have a fever or stiff neck. You have vision loss. Your muscles feel weak or like you cannot control them. You lose your balance often or have trouble walking. You faint. You have a seizure. At this time there does not appear to be the presence of an emergent medical condition, however there is always the potential for conditions to change. Please read and follow the below instructions.  Do not take your medicine if  develop an itchy rash, swelling in your mouth or lips, or difficulty breathing; call 911 and seek immediate emergency medical attention if this occurs.  You may review your lab tests and imaging results in their entirety on your MyChart account.  Please discuss all results of fully with your primary care provider and other specialist at your follow-up visit.  Note: Portions of this text may have been transcribed using voice recognition software. Every effort was made to ensure accuracy; however, inadvertent computerized transcription errors may still be present.

## 2023-02-28 NOTE — ED Triage Notes (Signed)
Patient here POV from Home.  Endorses Chest Discomfort since this AM. Also noted nausea and Migraine as well. No Emesis. No Fever. No Diarrhea. More Fatigue as well. No Discernable SOB.  NAD Noted during Triage. A&Ox4. Gcs 15. Ambulatory.

## 2023-03-03 ENCOUNTER — Ambulatory Visit: Payer: Managed Care, Other (non HMO) | Attending: Student | Admitting: Rehabilitative and Restorative Service Providers"

## 2023-03-03 ENCOUNTER — Encounter: Payer: Self-pay | Admitting: Rehabilitative and Restorative Service Providers"

## 2023-03-03 ENCOUNTER — Other Ambulatory Visit: Payer: Self-pay

## 2023-03-03 DIAGNOSIS — M6281 Muscle weakness (generalized): Secondary | ICD-10-CM | POA: Insufficient documentation

## 2023-03-03 DIAGNOSIS — R2689 Other abnormalities of gait and mobility: Secondary | ICD-10-CM | POA: Insufficient documentation

## 2023-03-03 DIAGNOSIS — R252 Cramp and spasm: Secondary | ICD-10-CM | POA: Insufficient documentation

## 2023-03-03 DIAGNOSIS — M5442 Lumbago with sciatica, left side: Secondary | ICD-10-CM | POA: Diagnosis present

## 2023-03-03 NOTE — Therapy (Signed)
OUTPATIENT PHYSICAL THERAPY THORACOLUMBAR EVALUATION   Patient Name: Francis Lin MRN: [id number] DOB:15-May-1976, 47 y.o., male Today's Date: 03/03/2023  END OF SESSION:  PT End of Session - 03/03/23 0850     Visit Number 1    Date for PT Re-Evaluation 04/28/23    Authorization Type cigna    PT Start Time 0845    PT Stop Time 0925    PT Time Calculation (min) 40 min    Activity Tolerance Patient tolerated treatment well    Behavior During Therapy WFL for tasks assessed/performed             Past Medical History:  Diagnosis Date   Acid reflux    Allergy    Anxiety    stress    Clotting disorder (HCC)    right leg x 2 in calf and behind knee    DVT (deep venous thrombosis) (HCC)    Esophagitis    Gout    Hypersomnia, persistent    IBS (irritable bowel syndrome)    PONV (postoperative nausea and vomiting)    nausea with no vomiting    Renal disorder    Shifting sleep-work schedule, affecting sleep 06/20/2013   Snoring disorder 06/20/2013   Tubular adenoma of colon    Past Surgical History:  Procedure Laterality Date   COLONOSCOPY     ESOPHAGEAL DILATION     HEMORRHOID BANDING     ORIF TIBIA & FIBULA FRACTURES Right    right leg surgery x 2 places- rod in place knee to ankle    UPPER GASTROINTESTINAL ENDOSCOPY     wisdom teeth     wrist surgery Right    Patient Active Problem List   Diagnosis Date Noted   OSA (obstructive sleep apnea) 06/20/2022   Episodic cluster headache, not intractable 06/20/2022   Sinus congestion 06/20/2022   Morbid obesity (HCC) 06/20/2022   Episode of change in speech    Aphasia 05/10/2022   LUQ abdominal pain 11/03/2020   Generalized abdominal pain 11/03/2020   Altered bowel habits 11/03/2020   Nausea without vomiting 11/03/2020   Migraine without aura and without status migrainosus, not intractable 11/22/2018   Nonsustained ventricular tachycardia (HCC) 08/03/2018   Elevated serum creatinine 06/18/2018   Slow heart  rate 06/06/2018   Shortness of breath 04/04/2018   Venous insufficiency 01/05/2017   Deep vein thrombosis (DVT) of popliteal vein of right lower extremity (HCC) 06/30/2016   S/P ORIF (open reduction internal fixation) fracture 06/22/2016   Multiple fractures of both lower extremities 05/31/2016   Elevated CK 05/17/2016   Fracture of right fibula 05/17/2016   Fracture of right tibia 05/17/2016   Fracture of tibia, left, closed 05/17/2016   Trauma 05/17/2016   Situational anxiety 08/28/2014   Unspecified vitamin D deficiency 09/22/2013   Shifting sleep-work schedule, affecting sleep 06/20/2013   Snoring disorder 06/20/2013   Hypersomnia, persistent    History of esophageal stricture 04/16/2013   Pain in joint, ankle and foot 04/16/2013   Gout 04/16/2013   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   OBESITY, CLASS II, BMI 35- 39.9 03/15/2012   Hypertriglyceridemia 03/15/2012   Atypical chest pain 12/02/2008   Dysphagia 12/02/2008    PCP: Kristian Covey, MD  REFERRING PROVIDER: Amador Cunas, PA-C  REFERRING DIAG: [phone] (ICD-10-CM) - Acute left-sided low back pain with left-sided sciatica  Rationale for Evaluation and Treatment: Rehabilitation  THERAPY DIAG:  Low back pain with left-sided sciatica, unspecified back pain laterality, unspecified chronicity  Cramp  and spasm  Muscle weakness (generalized)  Other abnormalities of gait and mobility  ONSET DATE: Has had "for years" but states that the pain got worse approximately a month ago  SUBJECTIVE:                                                                                                                                                                                           SUBJECTIVE STATEMENT: Pt reports that his back pain worsened approximately a month ago with pain radiating down left leg.  Patient reports a long history of back pain, but it has gotten worse recently.  Pt reports that he recently had an  injection in his left ankle.  PERTINENT HISTORY:  anxiety, depression, gout, IBS, hypertension, CKD  PAIN:  Are you having pain? Yes: NPRS scale: 7-10/10 Pain location: low back radiating down left leg Pain description: shooting/grabbing Aggravating factors: driving, walking, various activities Relieving factors: certain positions  PRECAUTIONS: None  WEIGHT BEARING RESTRICTIONS: No  FALLS:  Has patient fallen in last 6 months? 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  NEXT MD VISIT: Lumbar June 13th  OBJECTIVE:   DIAGNOSTIC FINDINGS:  Lumbar Radiograph on 02/09/2023: IMPRESSION: 1. Mild broad-based dextroscoliotic curvature centered at L4-L5. 2. Trace retrolisthesis of L2 on L3, unchanged on flexion and extension. 3. Mild L2-L3 degenerative disc disease. Mild L4-L5 facet hypertrophy.  Pelvic Radiograph on 02/09/2023: IMPRESSION: Bilateral hip osteoarthritis, right greater than left.  Left ankle radiograph on 02/09/2023: IMPRESSION: 1. Lucency involving the medial talar dome may represent an osteochondral lesion. No evidence of subchondral sclerosis or collapse. This could be further assessed with MRI as clinically indicated. 2. Mild tibial talar spurring. 3. Moderate-sized plantar calcaneal spur and Achilles tendon enthesophyte.  PATIENT SURVEYS:  Eval:  FOTO 31% (projected 52% by visit 13)  SCREENING FOR RED FLAGS: Bowel or bladder incontinence: No Spinal tumors: No Cauda equina syndrome: No Compression fracture: No Abdominal aneurysm:  No  COGNITION: Overall cognitive status: Within functional limits for tasks assessed     SENSATION: WFL  MUSCLE LENGTH: Hamstrings: tight bilaterally  POSTURE: rounded shoulders, forward head, and flexed trunk   PALPATION: Tender to palpation with spasms noted in lumbar multifidi, left piriformis  LUMBAR ROM:   Eval:  Limited at least 50% with pain   LOWER EXTREMITY MMT:    Eval: Right LE is WFL Left hip strength grossly 4+ to 5-/5, Left quad is 5-/5, left hamstring 4/5  LUMBAR SPECIAL TESTS:  Slump test: Positive on left side  FUNCTIONAL TESTS:  Eval: 5 times sit to stand: 32.82 sec with increased pain noted Timed up and go (TUG): 18 sec  GAIT: Distance walked: > 100 ft Assistive device utilized: None Level of assistance: Complete Independence Comments: Antalgic gait.  Pt reports that "if I'm not on pain medicine it's [pain] instant"  TODAY'S TREATMENT:                                                                                                                              DATE: 03/03/2023  Review of HEP (see below).  Required cuing on LAQ to emphasis hold at terminal knee extension for functional/dynamic hamstring stretch   PATIENT EDUCATION:  Education details: Issued HEP and educated about dry needling Person educated: Patient Education method: Explanation, Demonstration, and Handouts Education comprehension: verbalized understanding and returned demonstration  HOME EXERCISE PROGRAM: Access Code: [email or web address] URL: [email or web address] Date: 03/03/2023 Prepared by: Clydie Braun Inga Noller  Exercises - Seated Hamstring Stretch  - 1 x daily - 7 x weekly - 1 sets - 2 reps - 20 sec hold - Seated Scapular Retraction  - 1 x daily - 7 x weekly - 2 sets - 10 reps - Seated Long Arc Quad  - 1 x daily - 7 x weekly - 2 sets - 10 reps - Seated Transversus Abdominis Bracing  - 1 x daily - 7 x weekly - 2 sets - 10 reps - Correct Seated Posture  - 1 x daily - 7 x  weekly - 1 sets - 3 reps  ASSESSMENT:  CLINICAL IMPRESSION: Patient is a 47 y.o. male who was seen today for physical therapy evaluation and treatment for low back pain with left sciatica. 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  Patient presents with back pain radiating down left leg, left leg muscle weakness, antalgic gait, decreased postural awareness, and difficulty with functional tasks.  Patient would benefit from skilled PT to address his functional impairments to allow him to perform daily activities with decreased pain.  OBJECTIVE IMPAIRMENTS: decreased balance, difficulty walking, decreased strength, increased muscle spasms, impaired flexibility, improper body mechanics, postural dysfunction, and pain.   ACTIVITY LIMITATIONS: carrying, lifting, bending, standing, and squatting  PARTICIPATION LIMITATIONS: community activity and occupation  PERSONAL FACTORS: Profession and 3+ comorbidities: OA, gout,   are also affecting patient's functional outcome.   REHAB POTENTIAL: Good  CLINICAL DECISION MAKING: Evolving/moderate complexity  EVALUATION COMPLEXITY: Moderate   GOALS: Goals reviewed with patient? Yes  SHORT TERM GOALS: Target date: 03/24/2023  Pt will  be independent with initial HEP. Baseline: Goal status: INITIAL  2.  Patient will report at least a 30% improvement in pain and other symptoms. Baseline:  Goal status: INITIAL  LONG TERM GOALS: Target date: 04/28/2023  Patient will be independent with advanced HEP. Baseline:  Goal status: INITIAL  2.  Pt will improve FOTO to at least 52% to demonstrate improvements in functional activities. Baseline: 31% Goal status: INITIAL  3.  Patient will be able to report ability to ambulate for more than 10 minutes without increased pain reported to allow for community  activities. Baseline:  Goal status: INITIAL  4.  Patient will improve 5 times sit to stand to 20 seconds or less without increased pain to demonstrate improved functional strength. Baseline: 32.82 seconds with UE use required and increased pain Goal status: INITIAL  5.  Patient will be able to demonstrate improved postural awareness and body mechanics with lifting throughout PT session. Baseline:  Goal status: INITIAL  PLAN:  PT FREQUENCY: 1-2x/week  PT DURATION: 8 weeks  PLANNED INTERVENTIONS: Therapeutic exercises, Therapeutic activity, Neuromuscular re-education, Balance training, Gait training, Patient/Family education, Self Care, Joint mobilization, Joint manipulation, Stair training, Aquatic Therapy, Dry Needling, Electrical stimulation, Spinal manipulation, Spinal mobilization, Cryotherapy, Moist heat, Taping, Traction, Ultrasound, Ionotophoresis 4mg /ml Dexamethasone, Manual therapy, and Re-evaluation.  PLAN FOR NEXT SESSION: Assess and progress HEP as indicated, strengthening, core stability, manual/dry needling as indicated   Reather Laurence, PT 03/03/2023, 11:49 AM  [facility] [address], [address] [place], [place] [address] Phone # [phone] Fax [phone]

## 2023-03-03 NOTE — Patient Instructions (Signed)

## 2023-03-09 ENCOUNTER — Ambulatory Visit: Payer: Managed Care, Other (non HMO) | Attending: Physician Assistant | Admitting: Physician Assistant

## 2023-03-09 ENCOUNTER — Encounter: Payer: Self-pay | Admitting: Physician Assistant

## 2023-03-09 VITALS — BP 128/87 | HR 68 | Resp 18 | Ht 74.5 in | Wt 330.2 lb

## 2023-03-09 DIAGNOSIS — I872 Venous insufficiency (chronic) (peripheral): Secondary | ICD-10-CM

## 2023-03-09 DIAGNOSIS — M79671 Pain in right foot: Secondary | ICD-10-CM | POA: Diagnosis not present

## 2023-03-09 DIAGNOSIS — F418 Other specified anxiety disorders: Secondary | ICD-10-CM

## 2023-03-09 DIAGNOSIS — Z8781 Personal history of (healed) traumatic fracture: Secondary | ICD-10-CM

## 2023-03-09 DIAGNOSIS — R918 Other nonspecific abnormal finding of lung field: Secondary | ICD-10-CM

## 2023-03-09 DIAGNOSIS — M1A00X0 Idiopathic chronic gout, unspecified site, without tophus (tophi): Secondary | ICD-10-CM | POA: Diagnosis not present

## 2023-03-09 DIAGNOSIS — M79672 Pain in left foot: Secondary | ICD-10-CM

## 2023-03-09 DIAGNOSIS — R7989 Other specified abnormal findings of blood chemistry: Secondary | ICD-10-CM

## 2023-03-09 DIAGNOSIS — G43009 Migraine without aura, not intractable, without status migrainosus: Secondary | ICD-10-CM

## 2023-03-09 DIAGNOSIS — Z9889 Other specified postprocedural states: Secondary | ICD-10-CM

## 2023-03-09 DIAGNOSIS — Z8619 Personal history of other infectious and parasitic diseases: Secondary | ICD-10-CM

## 2023-03-09 DIAGNOSIS — Z8719 Personal history of other diseases of the digestive system: Secondary | ICD-10-CM

## 2023-03-09 DIAGNOSIS — M170 Bilateral primary osteoarthritis of knee: Secondary | ICD-10-CM

## 2023-03-09 DIAGNOSIS — I4729 Other ventricular tachycardia: Secondary | ICD-10-CM

## 2023-03-09 DIAGNOSIS — M19042 Primary osteoarthritis, left hand: Secondary | ICD-10-CM

## 2023-03-09 DIAGNOSIS — G4710 Hypersomnia, unspecified: Secondary | ICD-10-CM

## 2023-03-09 DIAGNOSIS — E781 Pure hyperglyceridemia: Secondary | ICD-10-CM

## 2023-03-09 DIAGNOSIS — M19041 Primary osteoarthritis, right hand: Secondary | ICD-10-CM

## 2023-03-09 DIAGNOSIS — Z86718 Personal history of other venous thrombosis and embolism: Secondary | ICD-10-CM

## 2023-03-09 DIAGNOSIS — Z6841 Body Mass Index (BMI) 40.0 and over, adult: Secondary | ICD-10-CM

## 2023-03-17 ENCOUNTER — Ambulatory Visit (HOSPITAL_BASED_OUTPATIENT_CLINIC_OR_DEPARTMENT_OTHER): Payer: Managed Care, Other (non HMO) | Admitting: Orthopaedic Surgery

## 2023-03-23 ENCOUNTER — Ambulatory Visit
Admission: RE | Admit: 2023-03-23 | Discharge: 2023-03-23 | Disposition: A | Discharge status: Home / Self Care | Payer: Managed Care, Other (non HMO) | Source: Ambulatory Visit | Attending: Student | Admitting: Student

## 2023-03-23 DIAGNOSIS — M5442 Lumbago with sciatica, left side: Secondary | ICD-10-CM

## 2023-03-27 ENCOUNTER — Ambulatory Visit: Payer: Managed Care, Other (non HMO)

## 2023-03-28 ENCOUNTER — Ambulatory Visit: Payer: Managed Care, Other (non HMO) | Attending: Student | Admitting: Physical Therapy

## 2023-03-28 ENCOUNTER — Encounter: Payer: Self-pay | Admitting: Physical Therapy

## 2023-03-28 DIAGNOSIS — R2689 Other abnormalities of gait and mobility: Secondary | ICD-10-CM | POA: Insufficient documentation

## 2023-03-28 DIAGNOSIS — M5442 Lumbago with sciatica, left side: Secondary | ICD-10-CM | POA: Diagnosis present

## 2023-03-28 DIAGNOSIS — R252 Cramp and spasm: Secondary | ICD-10-CM | POA: Diagnosis present

## 2023-03-28 DIAGNOSIS — M6281 Muscle weakness (generalized): Secondary | ICD-10-CM | POA: Insufficient documentation

## 2023-03-28 NOTE — Therapy (Signed)
OUTPATIENT PHYSICAL THERAPY THORACOLUMBAR TREATMENT   Patient Name: Francis Lin MRN: [id number] DOB:05/24/1976, 47 y.o., male Today's Date: 03/28/2023  END OF SESSION:  PT End of Session - 03/28/23 1009     Visit Number 2    Date for PT Re-Evaluation 04/28/23    Authorization Type cigna    PT Start Time [id number]   pt late   PT Stop Time 1001   pt refused to continue with session   PT Time Calculation (min) 11 min    Activity Tolerance Treatment limited secondary to agitation    Behavior During Therapy [facility] for tasks assessed/performed              Past Medical History:  Diagnosis Date   Acid reflux    Allergy    Anxiety    stress    Clotting disorder (HCC)    right leg x 2 in calf and behind knee    DVT (deep venous thrombosis) (HCC)    Esophagitis    Gout    Hypersomnia, persistent    IBS (irritable bowel syndrome)    PONV (postoperative nausea and vomiting)    nausea with no vomiting    Renal disorder    Shifting sleep-work schedule, affecting sleep 06/20/2013   Snoring disorder 06/20/2013   Tubular adenoma of colon    Past Surgical History:  Procedure Laterality Date   COLONOSCOPY     ESOPHAGEAL DILATION     HEMORRHOID BANDING     ORIF TIBIA & FIBULA FRACTURES Right    right leg surgery x 2 places- rod in place knee to ankle    UPPER GASTROINTESTINAL ENDOSCOPY     wisdom teeth     wrist surgery Right    Patient Active Problem List   Diagnosis Date Noted   OSA (obstructive sleep apnea) 06/20/2022   Episodic cluster headache, not intractable 06/20/2022   Sinus congestion 06/20/2022   Morbid obesity (HCC) 06/20/2022   Episode of change in speech    Aphasia 05/10/2022   LUQ abdominal pain 11/03/2020   Generalized abdominal pain 11/03/2020   Altered bowel habits 11/03/2020   Nausea without vomiting 11/03/2020   Migraine without aura and without status migrainosus, not intractable 11/22/2018   Nonsustained ventricular tachycardia (HCC) 08/03/2018    Elevated serum creatinine 06/18/2018   Slow heart rate 06/06/2018   Shortness of breath 04/04/2018   Venous insufficiency 01/05/2017   Deep vein thrombosis (DVT) of popliteal vein of right lower extremity (HCC) 06/30/2016   S/P ORIF (open reduction internal fixation) fracture 06/22/2016   Multiple fractures of both lower extremities 05/31/2016   Elevated CK 05/17/2016   Fracture of right fibula 05/17/2016   Fracture of right tibia 05/17/2016   Fracture of tibia, left, closed 05/17/2016   Trauma 05/17/2016   Situational anxiety 08/28/2014   Unspecified vitamin D deficiency 09/22/2013   Shifting sleep-work schedule, affecting sleep 06/20/2013   Snoring disorder 06/20/2013   Hypersomnia, persistent    History of esophageal stricture 04/16/2013   Pain in joint, ankle and foot 04/16/2013   Gout 04/16/2013   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   OBESITY, CLASS II, BMI 35- 39.9 03/15/2012   Hypertriglyceridemia 03/15/2012   Atypical chest pain 12/02/2008   Dysphagia 12/02/2008    PCP: Kristian Covey, MD  REFERRING PROVIDER: Amador Cunas, PA-C  REFERRING DIAG: [phone] (ICD-10-CM) - Acute left-sided low back pain with left-sided sciatica  Rationale for Evaluation and Treatment: Rehabilitation  THERAPY DIAG:  Low back  pain with left-sided sciatica, unspecified back pain laterality, unspecified chronicity  Cramp and spasm  Muscle weakness (generalized)  Other abnormalities of gait and mobility  ONSET DATE: Has had "for years" but states that the pain got worse approximately a month ago  SUBJECTIVE:                                                                                                                                                                                           SUBJECTIVE STATEMENT:  I haven't been back since the first time because there weren't no times available. I've been doing the exercises, I haven't had that excruciating pain, I think  medicine and prayer and other things have been helping too. My neck is bothering me. Hard to say what movements are helping, "all of them were fine, the ones that she gave me". I don't know what I did to my neck, I always just have neck tension and pain (points to upper trap).    PERTINENT HISTORY:  anxiety, depression, gout, IBS, hypertension, CKD  PAIN:  Are you having pain? Yes: NPRS scale: 2/10 Pain location: low back  Pain description: discomfort  Aggravating factors: driving with equipment at my job Relieving factors: unsure, difficult to tell   PRECAUTIONS: None  WEIGHT BEARING RESTRICTIONS: No  FALLS:  Has patient fallen in last 6 months? 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  NEXT MD VISIT: Lumbar June 13th  OBJECTIVE:   DIAGNOSTIC FINDINGS:  Lumbar Radiograph on 02/09/2023: IMPRESSION: 1. Mild broad-based dextroscoliotic curvature centered at L4-L5. 2. Trace retrolisthesis of L2 on L3, unchanged on flexion and extension. 3. Mild L2-L3 degenerative disc disease. Mild L4-L5 facet hypertrophy.  Pelvic Radiograph on 02/09/2023: IMPRESSION: Bilateral hip osteoarthritis, right greater than left.  Left ankle radiograph on 02/09/2023: IMPRESSION: 1. Lucency involving the medial talar dome may represent an osteochondral lesion. No evidence of subchondral sclerosis or collapse. This could be further assessed with MRI as clinically indicated. 2. Mild tibial talar spurring. 3. Moderate-sized plantar calcaneal spur and Achilles tendon enthesophyte.  PATIENT  SURVEYS:  Eval:  FOTO 31% (projected 52% by visit 13)  SCREENING FOR RED FLAGS: Bowel or bladder incontinence: No Spinal tumors: No Cauda equina syndrome:  No Compression fracture: No Abdominal aneurysm: No  COGNITION: Overall cognitive status: Within functional limits for tasks assessed     SENSATION: WFL  MUSCLE LENGTH: Hamstrings: tight bilaterally  POSTURE: rounded shoulders, forward head, and flexed trunk   PALPATION: Tender to palpation with spasms noted in lumbar multifidi, left piriformis  LUMBAR ROM:   Eval:  Limited at least 50% with pain   LOWER EXTREMITY MMT:    Eval: Right LE is WFL Left hip strength grossly 4+ to 5-/5, Left quad is 5-/5, left hamstring 4/5  LUMBAR SPECIAL TESTS:  Slump test: Positive on left side  FUNCTIONAL TESTS:  Eval: 5 times sit to stand: 32.82 sec with increased pain noted Timed up and go (TUG): 18 sec  GAIT: Distance walked: > 100 ft Assistive device utilized: None Level of assistance: Complete Independence Comments: Antalgic gait.  Pt reports that "if I'm not on pain medicine it's [pain] instant"  TODAY'S TREATMENT:                                                                                                                              DATE:   03/28/23  TherEx  Bridges with L LE closer to glutes x10 PPT x6 max multimodal cues      03/03/2023  Review of HEP (see below).  Required cuing on LAQ to emphasis hold at terminal knee extension for functional/dynamic hamstring stretch   PATIENT EDUCATION:  Education details: Issued HEP and educated about dry needling Person educated: Patient Education method: Explanation, Demonstration, and Handouts Education comprehension: verbalized understanding and returned demonstration  HOME EXERCISE PROGRAM: Access Code: [email or web address] URL: [email or web address] Date: 03/03/2023 Prepared by: Clydie Braun Menke  Exercises - Seated Hamstring Stretch  - 1 x daily - 7 x  weekly - 1 sets - 2 reps - 20 sec hold - Seated Scapular Retraction  - 1 x daily - 7 x weekly - 2 sets - 10 reps - Seated Long Arc Quad  - 1 x daily - 7 x weekly - 2 sets - 10 reps - Seated Transversus Abdominis Bracing  - 1 x daily - 7 x weekly - 2 sets - 10 reps - Correct Seated Posture  - 1 x daily - 7 x weekly - 1 sets - 3 reps  ASSESSMENT:  CLINICAL IMPRESSION:  Carsyn arrived fairly late today but doing well, sounds like pain is better but reason for improvement in pain is unclear- multifactorial. Session very limited, pt also unclear in describing symptoms/symptom response to exercise. Started working on core strengthening as per POC- really needed a lot of heavy multimodal cues for exercises today. When we were working on posterior pelvic tilts, he suddenly became agitated with PT, got up from mat table and stated "I need another therapist, I can't do this". PT attempted to de-escalate/encourage pt to continue with session however pt remained irritated/agitated and insistent that he needed to work with another PT. Unable to identify what made  him upset or continue session, clinic supervisor aware.    OBJECTIVE IMPAIRMENTS: decreased balance, difficulty walking, decreased strength, increased muscle spasms, impaired flexibility, improper body mechanics, postural dysfunction, and pain.   ACTIVITY LIMITATIONS: carrying, lifting, bending, standing, and squatting  PARTICIPATION LIMITATIONS: community activity and occupation  PERSONAL FACTORS: Profession and 3+ comorbidities: OA, gout,   are also affecting patient's functional outcome.   REHAB POTENTIAL: Good  CLINICAL DECISION MAKING: Evolving/moderate complexity  EVALUATION COMPLEXITY: Moderate   GOALS: Goals reviewed with patient? Yes  SHORT TERM GOALS: Target date: 03/24/2023  Pt will be independent with initial HEP. Baseline: Goal status: INITIAL  2.  Patient will report at least a 30% improvement in pain and other  symptoms. Baseline:  Goal status: INITIAL  LONG TERM GOALS: Target date: 04/28/2023  Patient will be independent with advanced HEP. Baseline:  Goal status: INITIAL  2.  Pt will improve FOTO to at least 52% to demonstrate improvements in functional activities. Baseline: 31% Goal status: INITIAL  3.  Patient will be able to report ability to ambulate for more than 10 minutes without increased pain reported to allow for community activities. Baseline:  Goal status: INITIAL  4.  Patient will improve 5 times sit to stand to 20 seconds or less without increased pain to demonstrate improved functional strength. Baseline: 32.82 seconds with UE use required and increased pain Goal status: INITIAL  5.  Patient will be able to demonstrate improved postural awareness and body mechanics with lifting throughout PT session. Baseline:  Goal status: INITIAL  PLAN:  PT FREQUENCY: 1-2x/week  PT DURATION: 8 weeks  PLANNED INTERVENTIONS: Therapeutic exercises, Therapeutic activity, Neuromuscular re-education, Balance training, Gait training, Patient/Family education, Self Care, Joint mobilization, Joint manipulation, Stair training, Aquatic Therapy, Dry Needling, Electrical stimulation, Spinal manipulation, Spinal mobilization, Cryotherapy, Moist heat, Taping, Traction, Ultrasound, Ionotophoresis 4mg /ml Dexamethasone, Manual therapy, and Re-evaluation.  PLAN FOR NEXT SESSION: Assess and progress HEP as indicated, strengthening, core stability, manual/dry needling as indicated  Nedra Hai PT/DPt  PN2    [facility] [address] [place], [place] [address] Phone # [phone] Fax [phone]

## 2023-03-29 ENCOUNTER — Other Ambulatory Visit (HOSPITAL_BASED_OUTPATIENT_CLINIC_OR_DEPARTMENT_OTHER): Payer: Self-pay | Admitting: Orthopaedic Surgery

## 2023-03-29 ENCOUNTER — Ambulatory Visit (HOSPITAL_BASED_OUTPATIENT_CLINIC_OR_DEPARTMENT_OTHER): Payer: Managed Care, Other (non HMO) | Admitting: Orthopaedic Surgery

## 2023-03-29 ENCOUNTER — Telehealth (HOSPITAL_BASED_OUTPATIENT_CLINIC_OR_DEPARTMENT_OTHER): Payer: Self-pay | Admitting: Orthopaedic Surgery

## 2023-03-29 DIAGNOSIS — M67959 Unspecified disorder of synovium and tendon, unspecified thigh: Secondary | ICD-10-CM

## 2023-03-29 DIAGNOSIS — M7918 Myalgia, other site: Secondary | ICD-10-CM

## 2023-03-29 MED ORDER — METHOCARBAMOL 500 MG PO TABS
500.0000 mg | ORAL_TABLET | Freq: Four times a day (QID) | ORAL | 3 refills | Status: DC
Start: 2023-03-29 — End: 2024-02-05

## 2023-03-29 NOTE — Telephone Encounter (Signed)
Patient wants to make sure the meds that was prescribed  to him today was sent to the Pharmacy on file

## 2023-03-29 NOTE — Progress Notes (Signed)
Chief Complaint: Left hip and back pain     History of Present Illness:    Francis Lin is a 47 y.o. male presents today with ongoing left sided posterior thigh pain as well as lower back and mid back pain.  He has been working with physical therapy with [facility] working on his back.  This is provided limited relief.  He does have some radiating pain about the lateral aspect of the thigh down to the knee.  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  He is here today for further discussion.  As well as MRI review of his lumbar spine    Surgical History:   None  PMH/PSH/Family History/Social History/Meds/Allergies:    Past Medical History:  Diagnosis Date   Acid reflux    Allergy    Anxiety    stress    Clotting disorder (HCC)    right leg x 2 in calf and behind knee    DVT (deep venous thrombosis) (HCC)    Esophagitis    Gout    Hypersomnia, persistent    IBS (irritable bowel syndrome)    PONV (postoperative nausea and vomiting)    nausea with no vomiting    Renal disorder    Shifting sleep-work schedule, affecting sleep 06/20/2013   Snoring disorder 06/20/2013   Tubular adenoma of colon    Past Surgical History:  Procedure Laterality Date   COLONOSCOPY     ESOPHAGEAL DILATION     HEMORRHOID BANDING     ORIF TIBIA & FIBULA FRACTURES Right    right leg surgery x 2 places- rod in place knee to ankle    UPPER GASTROINTESTINAL ENDOSCOPY     wisdom teeth     wrist surgery Right    Social History   Socioeconomic History   Marital status: Married    Spouse name: Not on file   Number of children: 3   Years of education: college   Highest education level: Not on file  Occupational History    Employer: [employer]    Comment: [employer]  Tobacco Use   Smoking status: Never    Passive exposure: Past   Smokeless tobacco: Never  Vaping Use   Vaping Use: Never  used  Substance and Sexual Activity   Alcohol use: No   Drug use: No   Sexual activity: Not on file  Other Topics Concern   Not on file  Social History Narrative   Right handed   Caffeine use: tea sometimes   Social Determinants of Health   Financial Resource Strain: Not on file  Food Insecurity: Not on file  Transportation Needs: Not on file  Physical Activity: Not on file  Stress: Not on file  Social Connections: Not on file   Family History  Problem Relation Age of Onset   Lupus Mother    Heart attack Father    Arthritis Father    Hypertension Father    Cancer Father        multiple myeloma   Diabetes Father    Atrial fibrillation Father    Gout Father    Kidney failure Father    Edema Sister    Diabetes Sister    Bipolar disorder Sister    Stroke Sister  Obesity Sister    Adrenal disorder Sister    Lupus Maternal Aunt    Eczema Daughter    ADD / ADHD Daughter    Asthma Daughter    Eczema Daughter    ADD / ADHD Daughter    Hypotension Daughter    Eczema Daughter    High Cholesterol Daughter    Colon cancer Neg Hx    Colon polyps Neg Hx    Esophageal cancer Neg Hx    Rectal cancer Neg Hx    Stomach cancer Neg Hx    Allergies  Allergen Reactions   Peanuts [Peanut Oil] Anaphylaxis   Lactose Diarrhea and Other (See Comments)    Lactose intolerance Lactose intolerance Lactose intolerance   Allopurinol     rash   Caffeine Other (See Comments)    "makes feel bad", headaches   Gabapentin Other (See Comments)    Messes with memory   Ibuprofen Other (See Comments)    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     colchicine 0.6 MG tablet Take 0.6 mg 1 tablet by mouth daily. 90 tablet 0   cyanocobalamin 1000 MCG tablet Take 1 tablet (1,000 mcg total) by mouth daily.     cyclobenzaprine (FLEXERIL) 10 MG tablet Take 1 tablet (10 mg total) by mouth 2 (two) times daily as needed for muscle spasms. 20 tablet 0   febuxostat (ULORIC) 40 MG tablet Take 1 tablet (40 mg total) by mouth daily. 30 tablet 2   fluocinolone (VANOS) 0.01 % cream Apply 1 Application topically 2 (two) times daily as needed (Itching).     fluticasone (FLONASE) 50 MCG/ACT nasal spray Place 1 spray into both nostrils daily as needed for allergies or rhinitis.     loratadine (CLARITIN) 10 MG tablet Take 10 mg by mouth daily as needed for allergies.     nystatin cream (MYCOSTATIN) Apply 1 Application topically 2 (two) times daily as needed for dry skin (Itching). & triamcinolone compounded cream     topiramate (TOPAMAX) 25 MG tablet Take 1 tablet (25 mg total) by mouth at bedtime for 7 days, THEN 2 tablets (50 mg total) at bedtime for 21 days. 50 tablet 0   No current facility-administered medications for this visit.   No results found.  Review of Systems:   A ROS was performed including pertinent positives and negatives as documented in the HPI.  Physical Exam :   Constitutional: NAD and appears stated age Neurological: Alert and oriented Psych: Appropriate affect and cooperative There were no vitals taken for this visit.   Comprehensive Musculoskeletal Exam:    There is tenderness about the posterior gluteal as well as lateral trochanter.  Tenderness about the mid thoracic spine as well as lumbar spine musculature negative straight leg raise.  Positive Trendelenburg.  Some weakness with resisted abduction  Imaging:    MRI (lumbar spine): Normal  I personally reviewed and interpreted the radiographs.   Assessment:   47 y.o. male presents with left lateral based hip pain and thigh pain that  I  do believe is specifically radiating from the hip and abductor mechanism.  Given that this appears to be more of a tendinitis phenomenon I do believe he would benefit from adding a strengthening program for his hip and core onto his back therapy.  Will plan to provide him with a referral for this.  He will schedule for an additional injection on Friday into the lateral greater trochanteric region to hopefully get him a few days off after the injection  Plan :    -Follow-up for left hip gluteus medius injection     I personally saw and evaluated the patient, and participated in the management and treatment plan.  Huel Cote, MD Attending Physician, Orthopedic Surgery  This document was dictated using Dragon voice recognition software. A reasonable attempt at proof reading has been made to minimize errors.

## 2023-04-05 ENCOUNTER — Ambulatory Visit: Payer: Managed Care, Other (non HMO) | Admitting: Rehabilitative and Restorative Service Providers"

## 2023-04-07 ENCOUNTER — Encounter: Payer: Managed Care, Other (non HMO) | Admitting: Rehabilitative and Restorative Service Providers"

## 2023-04-17 ENCOUNTER — Ambulatory Visit: Payer: Managed Care, Other (non HMO) | Admitting: Rehabilitative and Restorative Service Providers"

## 2023-04-17 HISTORY — PX: CYST REMOVAL NECK: SHX6281

## 2023-04-18 ENCOUNTER — Ambulatory Visit: Payer: Managed Care, Other (non HMO) | Admitting: Rehabilitative and Restorative Service Providers"

## 2023-04-24 ENCOUNTER — Telehealth: Payer: Self-pay | Admitting: Rehabilitative and Restorative Service Providers"

## 2023-04-24 ENCOUNTER — Ambulatory Visit: Payer: Managed Care, Other (non HMO) | Attending: Student | Admitting: Rehabilitative and Restorative Service Providers"

## 2023-04-24 DIAGNOSIS — R252 Cramp and spasm: Secondary | ICD-10-CM | POA: Insufficient documentation

## 2023-04-24 DIAGNOSIS — M5442 Lumbago with sciatica, left side: Secondary | ICD-10-CM | POA: Insufficient documentation

## 2023-04-24 DIAGNOSIS — M6281 Muscle weakness (generalized): Secondary | ICD-10-CM | POA: Insufficient documentation

## 2023-04-24 DIAGNOSIS — M7918 Myalgia, other site: Secondary | ICD-10-CM | POA: Insufficient documentation

## 2023-04-24 DIAGNOSIS — R262 Difficulty in walking, not elsewhere classified: Secondary | ICD-10-CM | POA: Insufficient documentation

## 2023-04-24 NOTE — Telephone Encounter (Signed)
Called patient in regards to missed visit on 04/24/2023.  He states that he thought he had cancelled all appointments until 05/03/2023 at 4:15 pm.  Patient requested that the visit scheduled for 04/25/2023 be cancelled and he only have the one for 05/03/2023 at 4:15 PM remaining due to his schedule.  Patient reports that this is also currently having a gout flare, so he would not be able to participate in therapy this week.  Pt verbalized and confirmed appointment for 05/03/23 at 4:15 PM.

## 2023-04-25 ENCOUNTER — Ambulatory Visit: Payer: Managed Care, Other (non HMO) | Admitting: Rehabilitative and Restorative Service Providers"

## 2023-04-26 ENCOUNTER — Other Ambulatory Visit: Payer: Self-pay

## 2023-04-26 DIAGNOSIS — E049 Nontoxic goiter, unspecified: Secondary | ICD-10-CM

## 2023-05-02 ENCOUNTER — Other Ambulatory Visit: Payer: Self-pay

## 2023-05-02 ENCOUNTER — Emergency Department (HOSPITAL_BASED_OUTPATIENT_CLINIC_OR_DEPARTMENT_OTHER): Payer: Managed Care, Other (non HMO)

## 2023-05-02 ENCOUNTER — Encounter (HOSPITAL_BASED_OUTPATIENT_CLINIC_OR_DEPARTMENT_OTHER): Payer: Self-pay | Admitting: Emergency Medicine

## 2023-05-02 ENCOUNTER — Emergency Department (HOSPITAL_BASED_OUTPATIENT_CLINIC_OR_DEPARTMENT_OTHER)
Admission: EM | Admit: 2023-05-02 | Discharge: 2023-05-02 | Disposition: A | Discharge status: Home / Self Care | Payer: Managed Care, Other (non HMO) | Attending: Emergency Medicine | Admitting: Emergency Medicine

## 2023-05-02 DIAGNOSIS — Z1152 Encounter for screening for COVID-19: Secondary | ICD-10-CM | POA: Diagnosis not present

## 2023-05-02 DIAGNOSIS — R0602 Shortness of breath: Secondary | ICD-10-CM | POA: Insufficient documentation

## 2023-05-02 DIAGNOSIS — R079 Chest pain, unspecified: Secondary | ICD-10-CM | POA: Diagnosis not present

## 2023-05-02 LAB — TROPONIN I (HIGH SENSITIVITY)
Troponin I (High Sensitivity): 3 ng/L (ref <18)
Troponin I (High Sensitivity): 3 ng/L (ref <18)

## 2023-05-02 LAB — BASIC METABOLIC PANEL
Anion gap: 9 (ref 5–15)
BUN: 19 mg/dL (ref 6–20)
CO2: 24 mmol/L (ref 22–32)
Calcium: 8.6 mg/dL — ABNORMAL LOW (ref 8.9–10.3)
Chloride: 105 mmol/L (ref 98–111)
Creatinine, Ser: 1.6 mg/dL — ABNORMAL HIGH (ref 0.61–1.24)
GFR, Estimated: 53 mL/min — ABNORMAL LOW (ref >60)
Glucose, Bld: 103 mg/dL — ABNORMAL HIGH (ref 70–99)
Potassium: 4.6 mmol/L (ref 3.5–5.1)
Sodium: 138 mmol/L (ref 135–145)

## 2023-05-02 LAB — CBC
HCT: 43.1 % (ref 39.0–52.0)
Hemoglobin: 14.4 g/dL (ref 13.0–17.0)
MCH: 29.8 pg (ref 26.0–34.0)
MCHC: 33.4 g/dL (ref 30.0–36.0)
MCV: 89 fL (ref 80.0–100.0)
Platelets: 272 10*3/uL (ref 150–400)
RBC: 4.84 MIL/uL (ref 4.22–5.81)
RDW: 14.4 % (ref 11.5–15.5)
WBC: 8.3 10*3/uL (ref 4.0–10.5)
nRBC: 0 % (ref 0.0–0.2)

## 2023-05-02 LAB — RESP PANEL BY RT-PCR (RSV, FLU A&B, COVID)  RVPGX2
Influenza A by PCR: NEGATIVE
Influenza B by PCR: NEGATIVE
Resp Syncytial Virus by PCR: NEGATIVE
SARS Coronavirus 2 by RT PCR: NEGATIVE

## 2023-05-02 LAB — D-DIMER, QUANTITATIVE: D-Dimer, Quant: <0.27 ug/mL-FEU (ref 0.00–0.50)

## 2023-05-02 LAB — BRAIN NATRIURETIC PEPTIDE: B Natriuretic Peptide: 12.6 pg/mL (ref 0.0–100.0)

## 2023-05-02 MED ORDER — ACETAMINOPHEN 325 MG PO TABS
650.0000 mg | ORAL_TABLET | Freq: Once | ORAL | Status: AC
  Administered 2023-05-02: 650 mg via ORAL
  Filled 2023-05-02: qty 2

## 2023-05-02 NOTE — ED Provider Notes (Signed)
[facility] Provider Note   CSN: [id number] Arrival date & time: 05/02/23  1833     History  Chief Complaint  Patient presents with   Shortness of Breath   Palpitations    Francis Lin is a 47 y.o. male history of DVT not on anticoagulant, gout, sleep apnea on CPAP at night presents today for evaluation of chest pain and shortness of breath.  Patient reports feeling shortness of breath since 2 PM today after waking up from sleeping.  Patient sleeps with a CPAP mask on but states while walking up he felt like his chest was tight, felt like he was hyperventilating.  States the symptoms got worse with exertion. States that the night prior he had a more physically demanding shift and felt like he was not able to stay hydrated as he normally would.  He denies any fever, cough, runny nose, congestion, nausea, vomiting, headache, bowel change, urinary symptoms, blood in his stool or urine.   Shortness of Breath Palpitations Associated symptoms: shortness of breath       Past Medical History:  Diagnosis Date   Acid reflux    Allergy    Anxiety    stress    Clotting disorder (HCC)    right leg x 2 in calf and behind knee    DVT (deep venous thrombosis) (HCC)    Esophagitis    Gout    Hypersomnia, persistent    IBS (irritable bowel syndrome)    PONV (postoperative nausea and vomiting)    nausea with no vomiting    Renal disorder    Shifting sleep-work schedule, affecting sleep 06/20/2013   Snoring disorder 06/20/2013   Tubular adenoma of colon    Past Surgical History:  Procedure Laterality Date   COLONOSCOPY     ESOPHAGEAL DILATION     HEMORRHOID BANDING     ORIF TIBIA & FIBULA FRACTURES Right    right leg surgery x 2 places- rod in place knee to ankle    UPPER GASTROINTESTINAL ENDOSCOPY     wisdom teeth     wrist surgery Right      Home Medications Prior to Admission medications   Medication Sig Start Date End Date  Taking? Authorizing Provider  methocarbamol (ROBAXIN) 500 MG tablet Take 1 tablet (500 mg total) by mouth 4 (four) times daily. 03/29/23   Huel Cote, MD  acetaminophen (TYLENOL) 325 MG tablet Take 650 mg by mouth every 6 (six) hours as needed for mild pain.    [provider]  colchicine 0.6 MG tablet Take 0.6 mg 1 tablet by mouth daily. 10/07/22   Gearldine Bienenstock, PA-C  cyanocobalamin 1000 MCG tablet Take 1 tablet (1,000 mcg total) by mouth daily. 05/13/22   Lonia Blood, MD  cyclobenzaprine (FLEXERIL) 10 MG tablet Take 1 tablet (10 mg total) by mouth 2 (two) times daily as needed for muscle spasms. 06/01/21   Milagros Loll, MD  febuxostat (ULORIC) 40 MG tablet Take 1 tablet (40 mg total) by mouth daily. 10/07/22   Gearldine Bienenstock, PA-C  fluocinolone (VANOS) 0.01 % cream Apply 1 Application topically 2 (two) times daily as needed (Itching).    [provider]  fluticasone (FLONASE) 50 MCG/ACT nasal spray Place 1 spray into both nostrils daily as needed for allergies or rhinitis.    [provider]  loratadine (CLARITIN) 10 MG tablet Take 10 mg by mouth daily as needed for allergies.  [provider]  nystatin cream (MYCOSTATIN) Apply 1 Application topically 2 (two) times daily as needed for dry skin (Itching). & triamcinolone compounded cream    [provider]  topiramate (TOPAMAX) 25 MG tablet Take 1 tablet (25 mg total) by mouth at bedtime for 7 days, THEN 2 tablets (50 mg total) at bedtime for 21 days. 12/12/22 03/09/23  Ihor Austin, NP  cimetidine (TAGAMET) 200 MG tablet Take by mouth. 01/30/19 01/16/20  [provider]      Allergies    Peanuts [peanut oil], Lactose, Allopurinol, Caffeine, Gabapentin, Ibuprofen, Omeprazole, Penicillins, Seasonal ic [cholestatin], and Tape    Review of Systems   Review of Systems  Respiratory:  Positive for shortness of breath.   Cardiovascular:  Positive for palpitations.    Physical  Exam Updated Vital Signs BP (!) 146/87   Pulse 70   Temp 97.7 F (36.5 C) (Oral)   Resp (!) 7   Ht 6\' 2"  (1.88 m)   Wt (!) 149 kg   SpO2 97%   BMI 42.18 kg/m  Physical Exam Vitals and nursing note reviewed.  Constitutional:      Appearance: Normal appearance.  HENT:     Head: Normocephalic and atraumatic.     Mouth/Throat:     Mouth: Mucous membranes are moist.  Eyes:     General: No scleral icterus. Cardiovascular:     Rate and Rhythm: Normal rate and regular rhythm.     Pulses: Normal pulses.     Heart sounds: Normal heart sounds.  Pulmonary:     Effort: Pulmonary effort is normal.     Breath sounds: Normal breath sounds.  Abdominal:     General: Abdomen is flat.     Palpations: Abdomen is soft.     Tenderness: There is no abdominal tenderness.  Musculoskeletal:        General: No deformity.  Skin:    General: Skin is warm.     Findings: No rash.  Neurological:     General: No focal deficit present.     Mental Status: He is alert.  Psychiatric:        Mood and Affect: Mood normal.     ED Results / Procedures / Treatments   Labs (all labs ordered are listed, but only abnormal results are displayed) Labs Reviewed  CBC  BASIC METABOLIC PANEL  TROPONIN I (HIGH SENSITIVITY)    EKG EKG Interpretation Date/Time:  Tuesday May 02 2023 18:44:35 EDT Ventricular Rate:  71 PR Interval:  149 QRS Duration:  103 QT Interval:  392 QTC Calculation: 426 R Axis:   33  Text Interpretation: Sinus rhythm Normal ECG No significant change since prior 5/24 Confirmed by Meridee Score [phone]) on 05/02/2023 6:56:04 PM  Radiology No results found.  Procedures Procedures    Medications Ordered in ED Medications - No data to display  ED Course/ Medical Decision Making/ A&P                             Medical Decision Making Amount and/or Complexity of Data Reviewed Labs: ordered. Radiology: ordered.  Risk OTC drugs.   This patient presents to the ED for  chest pain, shortness of breath, this involves an extensive number of treatment options, and is a complaint that carries with a high risk of complications and morbidity.  The differential diagnosis includes ACS, pericarditis, PE, pneumothorax, pneumonia, less likely dissection with essentially normal blood pressure, symmetric  bilateral pulses, and no back pain.  This is not an exhaustive list.  Lab tests: I ordered and personally interpreted labs.  The pertinent results include: WBC unremarkable. Hbg unremarkable. Platelets unremarkable. Electrolytes unremarkable. BUN, creatinine unremarkable. BNP normal.  Viral panel negative.  Delta troponin negative.  D-dimer negative.  Imaging studies: I ordered imaging studies, personally reviewed, interpreted imaging and agree with the radiologist's interpretations. The results include: Chest x-ray showed no acute cardiopulmonary abnormalities.  Problem list/ ED course/ Critical interventions/ Medical management: HPI: See above Vital signs within normal range and stable throughout visit. Laboratory/imaging studies significant for: See above. On physical examination, patient is afebrile and appears in no acute distress.  Exam without evidence of volume overload, BNP was normal so doubt heart failure.  Unlikely ACS/MI, EKG without signs of active ischemia, given the timing of pain to ER presentation delta troponin was negative so doubt NSTEMI.  Unlikely PE, D-dimer was negative.  Chest x-ray showed no evidence of pneumonia or pneumothorax.  Unlikely myocarditis, patient has no recent illness, troponin was negative. Patient reports one episode of blurry vision that lasted for about 30 seconds and has resolved. This is likely related to his migraine headache. No focal neurological deficit on my examination.  Patient states his symptoms have improved during the course in the ER. Heart score was 2 so plan to discharge patient home with PCP and cardiology follow-up.   Strict return question discussed. I have reviewed the patient home medicines and have made adjustments as needed.  Cardiac monitoring/EKG: The patient was maintained on a cardiac monitor.  I personally reviewed and interpreted the cardiac monitor which showed an underlying rhythm of: sinus rhythm.  Additional history obtained: External records from outside source obtained and reviewed including: Chart review including previous notes, labs, imaging.  Consultations obtained:  Disposition Continued outpatient therapy. Follow-up with PCP recommended for reevaluation of symptoms. Treatment plan discussed with patient.  Pt acknowledged understanding was agreeable to the plan. Worrisome signs and symptoms were discussed with patient, and patient acknowledged understanding to return to the ED if they noticed these signs and symptoms. Patient was stable upon discharge.   This chart was dictated using voice recognition software.  Despite best efforts to proofread,  errors can occur which can change the documentation meaning.          Final Clinical Impression(s) / ED Diagnoses Final diagnoses:  Chest pain, unspecified type  Shortness of breath    Rx / DC Orders ED Discharge Orders          Ordered    Ambulatory referral to Cardiology       Comments: If you have not heard from the Cardiology office within the next 72 hours please call [phone].   05/02/23 2141              Jeanelle Malling, PA 05/02/23 2152    Terrilee Files, MD 05/03/23 1057

## 2023-05-02 NOTE — Discharge Instructions (Addendum)
Please take your medications as prescribed. Take tylenol/ibuprofen every 6 hours for pain. I recommend close follow-up with PCP for reevaluation.  Please do not hesitate to return to emergency department if worrisome signs symptoms we discussed become apparent.

## 2023-05-02 NOTE — ED Triage Notes (Signed)
Patient reports feeling short of breath since 2 pm today after waking from sleeping. Patient sleeps with a CPAP mask on but states while waking up felt like his chest was tight, felt like he was hyperventilating. Prior to waking up states he was at work and did have a more physically exerting shift felt like he wasn't able to stay hydrated as well as he normally would. AOX4. Ambulatory. States from walking to room feels short of breath exertionally.

## 2023-05-03 ENCOUNTER — Ambulatory Visit: Payer: Managed Care, Other (non HMO)

## 2023-05-03 DIAGNOSIS — M6281 Muscle weakness (generalized): Secondary | ICD-10-CM

## 2023-05-03 DIAGNOSIS — M5442 Lumbago with sciatica, left side: Secondary | ICD-10-CM | POA: Diagnosis present

## 2023-05-03 DIAGNOSIS — M7918 Myalgia, other site: Secondary | ICD-10-CM | POA: Diagnosis present

## 2023-05-03 DIAGNOSIS — R252 Cramp and spasm: Secondary | ICD-10-CM | POA: Diagnosis present

## 2023-05-03 DIAGNOSIS — R262 Difficulty in walking, not elsewhere classified: Secondary | ICD-10-CM

## 2023-05-03 NOTE — Therapy (Signed)
OUTPATIENT PHYSICAL THERAPY THORACOLUMBAR TREATMENT   Patient Name: Francis Lin MRN: [id number] DOB:1975/11/25, 47 y.o., male Today's Date: 05/03/2023  END OF SESSION:  PT End of Session - 05/03/23 1612     Visit Number 3    Date for PT Re-Evaluation 04/28/23    Authorization Type cigna    PT Start Time 1613    PT Stop Time 1700    PT Time Calculation (min) 47 min    Activity Tolerance Patient tolerated treatment well    Behavior During Therapy WFL for tasks assessed/performed              Past Medical History:  Diagnosis Date   Acid reflux    Allergy    Anxiety    stress    Clotting disorder (HCC)    right leg x 2 in calf and behind knee    DVT (deep venous thrombosis) (HCC)    Esophagitis    Gout    Hypersomnia, persistent    IBS (irritable bowel syndrome)    PONV (postoperative nausea and vomiting)    nausea with no vomiting    Renal disorder    Shifting sleep-work schedule, affecting sleep 06/20/2013   Snoring disorder 06/20/2013   Tubular adenoma of colon    Past Surgical History:  Procedure Laterality Date   COLONOSCOPY     ESOPHAGEAL DILATION     HEMORRHOID BANDING     ORIF TIBIA & FIBULA FRACTURES Right    right leg surgery x 2 places- rod in place knee to ankle    UPPER GASTROINTESTINAL ENDOSCOPY     wisdom teeth     wrist surgery Right    Patient Active Problem List   Diagnosis Date Noted   OSA (obstructive sleep apnea) 06/20/2022   Episodic cluster headache, not intractable 06/20/2022   Sinus congestion 06/20/2022   Morbid obesity (HCC) 06/20/2022   Episode of change in speech    Aphasia 05/10/2022   LUQ abdominal pain 11/03/2020   Generalized abdominal pain 11/03/2020   Altered bowel habits 11/03/2020   Nausea without vomiting 11/03/2020   Migraine without aura and without status migrainosus, not intractable 11/22/2018   Nonsustained ventricular tachycardia (HCC) 08/03/2018   Elevated serum creatinine 06/18/2018   Slow heart  rate 06/06/2018   Shortness of breath 04/04/2018   Venous insufficiency 01/05/2017   Deep vein thrombosis (DVT) of popliteal vein of right lower extremity (HCC) 06/30/2016   S/P ORIF (open reduction internal fixation) fracture 06/22/2016   Multiple fractures of both lower extremities 05/31/2016   Elevated CK 05/17/2016   Fracture of right fibula 05/17/2016   Fracture of right tibia 05/17/2016   Fracture of tibia, left, closed 05/17/2016   Trauma 05/17/2016   Situational anxiety 08/28/2014   Unspecified vitamin D deficiency 09/22/2013   Shifting sleep-work schedule, affecting sleep 06/20/2013   Snoring disorder 06/20/2013   Hypersomnia, persistent    History of esophageal stricture 04/16/2013   Pain in joint, ankle and foot 04/16/2013   Gout 04/16/2013   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   OBESITY, CLASS II, BMI 35- 39.9 03/15/2012   Hypertriglyceridemia 03/15/2012   Atypical chest pain 12/02/2008   Dysphagia 12/02/2008    PCP: Kristian Covey, MD  REFERRING PROVIDER: Amador Cunas, PA-C  REFERRING DIAG: [phone] (ICD-10-CM) - Acute left-sided low back pain with left-sided sciatica  Rationale for Evaluation and Treatment: Rehabilitation  THERAPY DIAG:  Low back pain with left-sided sciatica, unspecified back pain laterality, unspecified chronicity -  Plan: PT plan of care cert/re-cert  Cramp and spasm - Plan: PT plan of care cert/re-cert  Muscle weakness (generalized) - Plan: PT plan of care cert/re-cert  Difficulty in walking, not elsewhere classified - Plan: PT plan of care cert/re-cert  ONSET DATE: Has had "for years" but states that the pain got worse approximately a month ago  SUBJECTIVE:                                                                                                                                                                                           SUBJECTIVE STATEMENT: Patient reports he has been doing OK, "I had to go to the  hospital yesterday.  I was short of breath.  They did complete cardiac work up and all of this was clear.  They diagnosed this episode as anxiety.  He states he had very little back pain today.  He rates this pain at 2/10.    PERTINENT HISTORY:  anxiety, depression, gout, IBS, hypertension, CKD  PAIN:  Are you having pain? Yes: NPRS scale: 2/10 Pain location: low back  Pain description: discomfort  Aggravating factors: driving with equipment at my job Relieving factors: unsure, difficult to tell   PRECAUTIONS: None  WEIGHT BEARING RESTRICTIONS: No  FALLS:  Has patient fallen in last 6 months? 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  NEXT MD VISIT: Lumbar June 13th  OBJECTIVE:   DIAGNOSTIC FINDINGS:  Lumbar Radiograph on 02/09/2023: IMPRESSION: 1. Mild broad-based dextroscoliotic curvature centered at L4-L5. 2. Trace retrolisthesis of L2 on L3, unchanged on flexion and extension. 3. Mild L2-L3 degenerative disc disease. Mild L4-L5 facet hypertrophy.  Pelvic Radiograph on 02/09/2023: IMPRESSION: Bilateral hip osteoarthritis, right greater than left.  Left ankle radiograph on 02/09/2023: IMPRESSION: 1. Lucency involving the medial talar dome may represent an osteochondral lesion. No evidence of subchondral sclerosis or collapse. This could be further assessed with MRI as clinically indicated. 2. Mild tibial talar spurring. 3. Moderate-sized plantar calcaneal spur and Achilles tendon enthesophyte.  PATIENT SURVEYS:  Eval:  FOTO 31% (projected 52% by visit  13)  SCREENING FOR RED FLAGS: Bowel or bladder incontinence: No Spinal tumors: No Cauda equina syndrome: No Compression fracture: No Abdominal aneurysm: No  COGNITION: Overall cognitive status: Within functional limits for tasks assessed     SENSATION: WFL  MUSCLE LENGTH: Hamstrings: tight bilaterally  POSTURE: rounded shoulders, forward head, and flexed trunk   PALPATION: Tender to palpation with spasms noted in lumbar multifidi, left piriformis  LUMBAR ROM:   Eval:  Limited at least 50% with pain   LOWER EXTREMITY MMT:    Eval: Right LE is WFL Left hip strength grossly 4+ to 5-/5, Left quad is 5-/5, left hamstring 4/5  LUMBAR SPECIAL TESTS:  Slump test: Positive on left side  FUNCTIONAL TESTS:  Eval: 5 times sit to stand: 32.82 sec with increased pain noted Timed up and go (TUG): 18 sec  05/03/23: 5 times sit to stand: 22.17 sec with no pain noted Timed up and go (TUG): 10.67 sec  GAIT: Distance walked: > 100 ft Assistive device utilized: None Level of assistance: Complete Independence Comments: Antalgic gait.  Pt reports that "if I'm not on pain medicine it's [pain] instant"  TODAY'S TREATMENT:                                                                                                                              DATE: 05/03/23 Nustep x 5 min level 5 (PT present to discuss status and progress) Standing hamstring stretch 3 x 30 sec at steps Standing hip flexor/quad stretch 3 x 30 sec Hooklying PPT x 20 Hooklying PPT with 90/90 heel tap x 20 Hooklying PPT with dying bug x 20 Lower trunk rotation x 20 Open book x 10 each side   03/28/23 TherEx Bridges with L LE closer to glutes x10 PPT x6 max multimodal cues   03/03/2023  Review of HEP (see below).  Required cuing on LAQ to emphasis hold at terminal knee extension for functional/dynamic hamstring stretch   PATIENT EDUCATION:  Education details: Issued HEP and educated about dry  needling Person educated: Patient Education method: Explanation, Demonstration, and Handouts Education comprehension: verbalized understanding and returned demonstration  HOME EXERCISE PROGRAM: Access Code: [email or web address] URL: [email or web address] Date: 05/03/2023 Prepared by: Mikey Kirschner  Exercises - Seated Hamstring Stretch  - 1 x daily - 7 x weekly - 1 sets - 2 reps - 20 sec hold - Seated Scapular Retraction  - 1 x daily - 7 x weekly - 2 sets - 10 reps - Seated Long Arc Quad  - 1 x daily - 7 x weekly - 2 sets - 10 reps - Seated Transversus Abdominis Bracing  - 1 x daily - 7 x weekly - 2 sets - 10 reps - Correct Seated Posture  - 1 x daily - 7 x weekly - 1 sets - 3 reps - Standing Hamstring Stretch on Chair  - 1 x daily - 7 x weekly - 1 sets - 3 reps - 30 hold - Standing Quad Stretch with Table and Chair Support  - 1 x daily - 7 x weekly - 1 sets - 3 reps -  30 sec hold - Supine Posterior Pelvic Tilt  - 1 x daily - 7 x weekly - 1 sets - 20 reps - Supine 90/90 Alternating Heel Touches with Posterior Pelvic Tilt  - 1 x daily - 7 x weekly - 1 sets - 20 reps - Supine Dead Bug with Leg Extension  - 1 x daily - 7 x weekly - 1 sets - 20 reps  ASSESSMENT:  CLINICAL IMPRESSION:  Andyn arrived on time.  He seems to be responding to the exercises provided.  His objective findings are improved.  He is supposed to return to work tomorrow.  He is concerned that he could re-injure if he doesn't gain some more core strength.  PT agreed and suggested we recertify for 1 time per week for a few more weeks to teach focused core strengthening.  He was able to do some moderate level core work today without increased pain.  He would benefit from continuing skilled PT for LE flexibility, posture and body mechanics instruction and core stabilization.     OBJECTIVE IMPAIRMENTS: decreased balance, difficulty walking, decreased strength, increased muscle spasms, impaired flexibility, improper body  mechanics, postural dysfunction, and pain.   ACTIVITY LIMITATIONS: carrying, lifting, bending, standing, and squatting  PARTICIPATION LIMITATIONS: community activity and occupation  PERSONAL FACTORS: Profession and 3+ comorbidities: OA, gout,   are also affecting patient's functional outcome.   REHAB POTENTIAL: Good  CLINICAL DECISION MAKING: Evolving/moderate complexity  EVALUATION COMPLEXITY: Moderate   GOALS: Goals reviewed with patient? Yes  SHORT TERM GOALS: Target date: 03/24/2023  Pt will be independent with initial HEP. Baseline: Goal status: MET 05/03/23  2.  Patient will report at least a 30% improvement in pain and other symptoms. Baseline:  Goal status: MET 05/03/23  LONG TERM GOALS: Target date: 06/22/2023  Patient will be independent with advanced HEP. Baseline:  Goal status: INITIAL  2.  Pt will improve FOTO to at least 52% to demonstrate improvements in functional activities. Baseline: 31% Goal status: INITIAL  3.  Patient will be able to report ability to ambulate for more than 10 minutes without increased pain reported to allow for community activities. Baseline:  Goal status: INITIAL  4.  Patient will improve 5 times sit to stand to 20 seconds or less without increased pain to demonstrate improved functional strength. Baseline: 32.82 seconds with UE use required and increased pain Goal status: INITIAL  5.  Patient will be able to demonstrate improved postural awareness and body mechanics with lifting throughout PT session. Baseline:  Goal status: INITIAL  PLAN:  PT FREQUENCY: 1-2x/week  PT DURATION: 8 weeks  PLANNED INTERVENTIONS: Therapeutic exercises, Therapeutic activity, Neuromuscular re-education, Balance training, Gait training, Patient/Family education, Self Care, Joint mobilization, Joint manipulation, Stair training, Aquatic Therapy, Dry Needling, Electrical stimulation, Spinal manipulation, Spinal mobilization, Cryotherapy, Moist heat,  Taping, Traction, Ultrasound, Ionotophoresis 4mg /ml Dexamethasone, Manual therapy, and Re-evaluation.  PLAN FOR NEXT SESSION: Recertifying today for 1 x per week x 8 more weeks.  Assess and progress HEP as indicated, strengthening, focus on core stability, manual/dry needling as indicated  Feather Berrie B. Cailyn Houdek, PT 05/03/23 5:10 PM  [facility] [address] [place], [place] [address] Phone # [phone] Fax [phone]

## 2023-05-05 ENCOUNTER — Ambulatory Visit (HOSPITAL_BASED_OUTPATIENT_CLINIC_OR_DEPARTMENT_OTHER): Payer: Managed Care, Other (non HMO) | Admitting: Orthopaedic Surgery

## 2023-05-11 ENCOUNTER — Other Ambulatory Visit (INDEPENDENT_AMBULATORY_CARE_PROVIDER_SITE_OTHER): Payer: Managed Care, Other (non HMO)

## 2023-05-11 DIAGNOSIS — E049 Nontoxic goiter, unspecified: Secondary | ICD-10-CM

## 2023-05-11 LAB — TSH: TSH: 2.57 u[IU]/mL (ref 0.35–5.50)

## 2023-05-11 LAB — T4, FREE: Free T4: 1.15 ng/dL (ref 0.60–1.60)

## 2023-05-17 ENCOUNTER — Ambulatory Visit: Payer: Managed Care, Other (non HMO) | Admitting: "Endocrinology

## 2023-05-17 ENCOUNTER — Encounter: Payer: Self-pay | Admitting: "Endocrinology

## 2023-05-17 VITALS — BP 128/84 | HR 75 | Ht 74.0 in | Wt 329.0 lb

## 2023-05-17 DIAGNOSIS — E042 Nontoxic multinodular goiter: Secondary | ICD-10-CM

## 2023-05-17 NOTE — Progress Notes (Signed)
Outpatient Endocrinology Note Francis Havana, MD  05/17/23   Francis Lin 1976-04-12 [id number]  Referring Provider: Shirline Frees, NP Primary Care Provider: Kristian Covey, MD Subjective  No chief complaint on file.   Assessment & Plan  Diagnoses and all orders for this visit:  Multinodular goiter -     TSH Rfx on Abnormal to Free T4; Future    Francis Lin is not on any thyroid medication TSH, FT4 WNL No medications indicated  02/2023 thyroid ultrasound and 12/2022 CT chest images and report reviewed No tracheal compression recognized on CT chest Thyroid ultrasound reported thyromegaly with two 2 cm isthmus TR3  thyroid nodule needing follow-up in 1 year and another 2 cm Right mid TR2 nodule not meeting criteria for any follow-up/FNA Recommend repeat thyroid ultrasound in a year  I have reviewed current medications, nurse's notes, allergies, vital signs, past medical and surgical history, family medical history, and social history for this encounter. Counseled patient on symptoms, examination findings, lab findings, imaging results, treatment decisions and monitoring and prognosis. The patient understood the recommendations and agrees with the treatment plan. All questions regarding treatment plan were fully answered.   Return in about 1 year (around 05/16/2024) for visit + labs before next visit.   Francis Inyokern, MD  05/17/23   I have reviewed current medications, nurse's notes, allergies, vital signs, past medical and surgical history, family medical history, and social history for this encounter. Counseled patient on symptoms, examination findings, lab findings, imaging results, treatment decisions and monitoring and prognosis. The patient understood the recommendations and agrees with the treatment plan. All questions regarding treatment plan were fully answered.   History of Present Illness Francis Lin is a 47 y.o. year old male who presents  to our clinic with multinodular goiter diagnosed in 2024.    Hs been working third shift for 18 years. SOB on exertion  Compressive symptoms:  dysphagia  Yes, sometimes dysphonia  No positional dyspnea (especially with simultaneous arms elevation)  No  Smokes  No On biotin  No Personal history of head/neck thyroid surgery/irradiation  No  12/2022 CT chest  2.5 cm low-attenuation right thyroid nodule.   THYROID ULTRASOUND   TECHNIQUE: Ultrasound examination of the thyroid gland and adjacent soft tissues was performed.   COMPARISON:  Chest CT-12/27/2022   FINDINGS: Parenchymal Echotexture: Normal   Isthmus: Enlarged measuring 1.1 cm in diameter   Right lobe: Enlarged measuring 6.4 x 2.5 x 2.3 cm   Left lobe: Enlarged measuring 6.2 x 2.5 x 2.2 cm   _________________________________________________________   Estimated total number of nodules >/= 1 cm: 2   Number of spongiform nodules >/=  2 cm not described below (TR1): 0   Number of mixed cystic and solid nodules >/= 1.5 cm not described below (TR2): 0   _________________________________________________________   Nodule # 1:   Location: Isthmus; Mid - correlates with the nodule questioned on preceding chest CT   Maximum size: 2.0 cm; Other 2 dimensions: 1.4 x 1.1 cm   Composition: solid/almost completely solid (2)   Echogenicity: isoechoic (1) 2 cm Shape: not taller-than-wide (0)   Margins: ill-defined (0)   Echogenic foci: none (0)   ACR TI-RADS total points: 3.   ACR TI-RADS risk category: TR3 (3 points).   ACR TI-RADS recommendations:   *Given size (>/= 1.5 - 2.4 cm) and appearance, a follow-up ultrasound in 1 year should be considered based on TI-RADS criteria.   _________________________________________________________  Nodule # 2:   Location: Right; Mid   Maximum size: 2.0 cm; Other 2 dimensions: 1.5 x 1.1 cm   Composition: mixed cystic and solid (1)   Echogenicity: isoechoic (1)    Shape: not taller-than-wide (0)   Margins: ill-defined (0)   Echogenic foci: none (0)   ACR TI-RADS total points: 2.   ACR TI-RADS risk category: TR2 (2 points).   ACR TI-RADS recommendations:   This nodule does NOT meet TI-RADS criteria for biopsy or dedicated follow-up.   _________________________________________________________   IMPRESSION: 1. Thyromegaly with findings suggestive of multinodular goiter. 2. Nodule labeled #1 which correlates with the nodule questioned on preceding chest CT, meets imaging criteria to recommend a 1 year follow-up. 3. Solitary additional right-sided mixed cystic and solid nodule does not meet criteria to recommend dedicated follow-up.  Physical Exam  BP 128/84   Pulse 75   Ht 6\' 2"  (1.88 m)   Wt (!) 329 lb (149.2 kg)   SpO2 97%   BMI 42.24 kg/m  Constitutional: well developed, well nourished Head: normocephalic, atraumatic, no exophthalmos Eyes: sclera anicteric, no redness Neck: + thyromegaly, no thyroid tenderness; - nodules palpated Lungs: normal respiratory effort Neurology: alert and oriented, no fine hand tremor Skin: dry, no appreciable rashes Musculoskeletal: no appreciable defects Psychiatric: normal mood and affect  Allergies Allergies  Allergen Reactions   Peanuts [Peanut Oil] Anaphylaxis   Lactose Diarrhea and Other (See Comments)    Lactose intolerance Lactose intolerance Lactose intolerance   Allopurinol     rash   Caffeine Other (See Comments)    "makes feel bad", headaches   Gabapentin Other (See Comments)    Messes with memory   Ibuprofen Other (See Comments)    Early onset of kidney issues (Stage 3A)   Omeprazole Other (See Comments)    Mood swing   Penicillins Other (See Comments)    Headaches    Seasonal Ic [Cholestatin]     Sneezing,runny nose , itchy eyes   Tape     Adhesive tape- caused rash    Current Medications Patient's Medications  New Prescriptions   No medications on file   Previous Medications   ACETAMINOPHEN (TYLENOL) 325 MG TABLET    Take 650 mg by mouth every 6 (six) hours as needed for mild pain.   COLCHICINE 0.6 MG TABLET    Take 0.6 mg 1 tablet by mouth daily.   CYANOCOBALAMIN 1000 MCG TABLET    Take 1 tablet (1,000 mcg total) by mouth daily.   FEBUXOSTAT (ULORIC) 40 MG TABLET    Take 1 tablet (40 mg total) by mouth daily.   FLUOCINOLONE (VANOS) 0.01 % CREAM    Apply 1 Application topically 2 (two) times daily as needed (Itching).   FLUTICASONE (FLONASE) 50 MCG/ACT NASAL SPRAY    Place 1 spray into both nostrils daily as needed for allergies or rhinitis.   LORATADINE (CLARITIN) 10 MG TABLET    Take 10 mg by mouth daily as needed for allergies.   METHOCARBAMOL (ROBAXIN) 500 MG TABLET    Take 1 tablet (500 mg total) by mouth 4 (four) times daily.   NYSTATIN CREAM (MYCOSTATIN)    Apply 1 Application topically 2 (two) times daily as needed for dry skin (Itching). & triamcinolone compounded cream   TOPIRAMATE (TOPAMAX) 25 MG TABLET    Take 1 tablet (25 mg total) by mouth at bedtime for 7 days, THEN 2 tablets (50 mg total) at bedtime for 21 days.  Modified Medications  No medications on file  Discontinued Medications   CYCLOBENZAPRINE (FLEXERIL) 10 MG TABLET    Take 1 tablet (10 mg total) by mouth 2 (two) times daily as needed for muscle spasms.    Past Medical History Past Medical History:  Diagnosis Date   Acid reflux    Allergy    Anxiety    stress    Clotting disorder (HCC)    right leg x 2 in calf and behind knee    DVT (deep venous thrombosis) (HCC)    Esophagitis    Gout    Hypersomnia, persistent    IBS (irritable bowel syndrome)    PONV (postoperative nausea and vomiting)    nausea with no vomiting    Renal disorder    Shifting sleep-work schedule, affecting sleep 06/20/2013   Snoring disorder 06/20/2013   Tubular adenoma of colon     Past Surgical History Past Surgical History:  Procedure Laterality Date   COLON SURGERY      COLONOSCOPY     CYST REMOVAL NECK  04/17/2023   ESOPHAGEAL DILATION     HEMORRHOID BANDING     ORIF TIBIA & FIBULA FRACTURES Right    right leg surgery x 2 places- rod in place knee to ankle    UPPER GASTROINTESTINAL ENDOSCOPY     wisdom teeth     wrist surgery Right     Family History family history includes ADD / ADHD in his daughter and daughter; Adrenal disorder in his sister; Arthritis in his father; Asthma in his daughter; Atrial fibrillation in his father; Bipolar disorder in his sister; Cancer in his father; Diabetes in his father and sister; Eczema in his daughter, daughter, and daughter; Edema in his sister; Gout in his father; Heart attack in his father; High Cholesterol in his daughter; Hypertension in his father; Hypotension in his daughter; Kidney failure in his father; Lupus in his maternal aunt and mother; Obesity in his sister; Stroke in his sister.  Social History Social History   Socioeconomic History   Marital status: Married    Spouse name: Not on file   Number of children: 3   Years of education: college   Highest education level: Not on file  Occupational History    Employer: [employer]    Comment: [employer]  Tobacco Use   Smoking status: Never    Passive exposure: Past   Smokeless tobacco: Never  Vaping Use   Vaping status: Never Used  Substance and Sexual Activity   Alcohol use: No   Drug use: No   Sexual activity: Not on file  Other Topics Concern   Not on file  Social History Narrative   Right handed   Caffeine use: tea sometimes   Social Determinants of Health   Financial Resource Strain: Not on file  Food Insecurity: Not on file  Transportation Needs: Not on file  Physical Activity: Not on file  Stress: Not on file  Social Connections: Unknown (02/28/2023)   [facility]   Social Network    Social Network: Not on file  Intimate Partner Violence: Unknown (02/28/2023)   Received from [facility]   HITS     Physically Hurt: Not on file    Insult or Talk Down To: Not on file    Threaten Physical Harm: Not on file    Scream or Curse: Not on file    Laboratory Investigations Lab Results  Component Value Date   TSH 2.57 05/11/2023   TSH  2.314 05/10/2022   TSH 2.24 02/22/2021   FREET4 1.15 05/11/2023   FREET4 1.34 08/17/2012     No results found for: "TSI"   No components found for: "TRAB"   Lab Results  Component Value Date   CHOL 128 02/17/2020   Lab Results  Component Value Date   HDL 33.30 (L) 02/17/2020   Lab Results  Component Value Date   LDLCALC 80 02/17/2020   Lab Results  Component Value Date   TRIG 76.0 02/17/2020   Lab Results  Component Value Date   CHOLHDL 4 02/17/2020   Lab Results  Component Value Date   CREATININE 1.60 (H) 05/02/2023   Lab Results  Component Value Date   GFR 53.22 (L) 02/22/2021      Component Value Date/Time   NA 138 05/02/2023 1853   NA 139 09/15/2022 0000   K 4.6 05/02/2023 1853   CL 105 05/02/2023 1853   CO2 24 05/02/2023 1853   GLUCOSE 103 (H) 05/02/2023 1853   BUN 19 05/02/2023 1853   BUN 16 09/15/2022 0000   CREATININE 1.60 (H) 05/02/2023 1853   CREATININE 1.30 (H) 08/02/2022 1454   CALCIUM 8.6 (L) 05/02/2023 1853   PROT 8.0 10/02/2022 0425   PROT 6.6 04/13/2018 0000   ALBUMIN 4.0 10/02/2022 0425   ALBUMIN 3.8 04/13/2018 0000   AST 24 10/02/2022 0425   ALT 29 10/02/2022 0425   ALKPHOS 71 10/02/2022 0425   BILITOT 0.8 10/02/2022 0425   BILITOT 0.4 04/13/2018 0000   GFRNONAA 53 (L) 05/02/2023 1853   GFRAA 58 (L) 03/22/2020 1719      Latest Ref Rng & Units 05/02/2023    6:53 PM 02/28/2023    4:10 PM 11/28/2022    2:10 PM  BMP  Glucose 70 - 99 mg/dL 161  096  84   BUN 6 - 20 mg/dL 19  13  12    Creatinine 0.61 - 1.24 mg/dL 0.45  4.09  8.11   Sodium 135 - 145 mmol/L 138  137  136   Potassium 3.5 - 5.1 mmol/L 4.6  4.1  3.5   Chloride 98 - 111 mmol/L 105  104  105   CO2 22 - 32 mmol/L 24  23  24    Calcium 8.9 -  10.3 mg/dL 8.6  9.0  8.5        Component Value Date/Time   WBC 8.3 05/02/2023 1853   RBC 4.84 05/02/2023 1853   HGB 14.4 05/02/2023 1853   HCT 43.1 05/02/2023 1853   PLT 272 05/02/2023 1853   MCV 89.0 05/02/2023 1853   MCV 95.5 05/05/2013 1614   MCH 29.8 05/02/2023 1853   MCHC 33.4 05/02/2023 1853   RDW 14.4 05/02/2023 1853   LYMPHSABS 1,987 08/02/2022 1454   MONOABS 0.6 05/09/2022 1730   EOSABS 202 08/02/2022 1454   BASOSABS 74 08/02/2022 1454      Parts of this note may have been dictated using voice recognition software. There may be variances in spelling and vocabulary which are unintentional. Not all errors are proofread. Please notify the Thereasa Parkin if any discrepancies are noted or if the meaning of any statement is not clear.

## 2023-05-19 ENCOUNTER — Ambulatory Visit: Payer: Managed Care, Other (non HMO) | Attending: Student

## 2023-05-19 DIAGNOSIS — R262 Difficulty in walking, not elsewhere classified: Secondary | ICD-10-CM | POA: Diagnosis present

## 2023-05-19 DIAGNOSIS — R2689 Other abnormalities of gait and mobility: Secondary | ICD-10-CM | POA: Insufficient documentation

## 2023-05-19 DIAGNOSIS — R252 Cramp and spasm: Secondary | ICD-10-CM | POA: Insufficient documentation

## 2023-05-19 DIAGNOSIS — M5442 Lumbago with sciatica, left side: Secondary | ICD-10-CM | POA: Insufficient documentation

## 2023-05-19 DIAGNOSIS — M6281 Muscle weakness (generalized): Secondary | ICD-10-CM | POA: Insufficient documentation

## 2023-05-19 NOTE — Therapy (Signed)
OUTPATIENT PHYSICAL THERAPY THORACOLUMBAR TREATMENT   Patient Name: Francis Lin MRN: [id number] DOB:1976-03-14, 47 y.o., male Today's Date: 05/19/2023  END OF SESSION:  PT End of Session - 05/19/23 0804     Visit Number 4    Date for PT Re-Evaluation 06/28/23    Authorization Type cigna    PT Start Time 0805    PT Stop Time 0853    PT Time Calculation (min) 48 min    Activity Tolerance Patient tolerated treatment well    Behavior During Therapy WFL for tasks assessed/performed              Past Medical History:  Diagnosis Date   Acid reflux    Allergy    Anxiety    stress    Clotting disorder (HCC)    right leg x 2 in calf and behind knee    DVT (deep venous thrombosis) (HCC)    Esophagitis    Gout    Hypersomnia, persistent    IBS (irritable bowel syndrome)    PONV (postoperative nausea and vomiting)    nausea with no vomiting    Renal disorder    Shifting sleep-work schedule, affecting sleep 06/20/2013   Snoring disorder 06/20/2013   Tubular adenoma of colon    Past Surgical History:  Procedure Laterality Date   COLON SURGERY     COLONOSCOPY     CYST REMOVAL NECK  04/17/2023   ESOPHAGEAL DILATION     HEMORRHOID BANDING     ORIF TIBIA & FIBULA FRACTURES Right    right leg surgery x 2 places- rod in place knee to ankle    UPPER GASTROINTESTINAL ENDOSCOPY     wisdom teeth     wrist surgery Right    Patient Active Problem List   Diagnosis Date Noted   OSA (obstructive sleep apnea) 06/20/2022   Episodic cluster headache, not intractable 06/20/2022   Sinus congestion 06/20/2022   Morbid obesity (HCC) 06/20/2022   Episode of change in speech    Aphasia 05/10/2022   LUQ abdominal pain 11/03/2020   Generalized abdominal pain 11/03/2020   Altered bowel habits 11/03/2020   Nausea without vomiting 11/03/2020   Migraine without aura and without status migrainosus, not intractable 11/22/2018   Nonsustained ventricular tachycardia (HCC) 08/03/2018    Elevated serum creatinine 06/18/2018   Slow heart rate 06/06/2018   Shortness of breath 04/04/2018   Venous insufficiency 01/05/2017   Deep vein thrombosis (DVT) of popliteal vein of right lower extremity (HCC) 06/30/2016   S/P ORIF (open reduction internal fixation) fracture 06/22/2016   Multiple fractures of both lower extremities 05/31/2016   Elevated CK 05/17/2016   Fracture of right fibula 05/17/2016   Fracture of right tibia 05/17/2016   Fracture of tibia, left, closed 05/17/2016   Trauma 05/17/2016   Situational anxiety 08/28/2014   Unspecified vitamin D deficiency 09/22/2013   Shifting sleep-work schedule, affecting sleep 06/20/2013   Snoring disorder 06/20/2013   Hypersomnia, persistent    History of esophageal stricture 04/16/2013   Pain in joint, ankle and foot 04/16/2013   Gout 04/16/2013   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   OBESITY, CLASS II, BMI 35- 39.9 03/15/2012   Hypertriglyceridemia 03/15/2012   Atypical chest pain 12/02/2008   Dysphagia 12/02/2008    PCP: Kristian Covey, MD  REFERRING PROVIDER: Amador Cunas, PA-C  REFERRING DIAG: [phone] (ICD-10-CM) - Acute left-sided low back pain with left-sided sciatica  Rationale for Evaluation and Treatment: Rehabilitation  THERAPY DIAG:  Low back pain with left-sided sciatica, unspecified back pain laterality, unspecified chronicity  Cramp and spasm  Muscle weakness (generalized)  Difficulty in walking, not elsewhere classified  Other abnormalities of gait and mobility  ONSET DATE:     SUBJECTIVE:                                                                                                                                                                                           SUBJECTIVE STATEMENT:  Patient reports he worked 3rd shift last night.  "Tired".   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  Not much time to exercise.  However, back pain is reported at 2-3/10.  He does report  some right achilles pain.  He admits he had some issue with this in the past and did a cortisone injection.  It has eased off in the past few days.   PERTINENT HISTORY:  anxiety, depression, gout, IBS, hypertension, CKD  PAIN:  Are you having pain? Yes: NPRS scale: 2/10 Pain location: low back  Pain description: discomfort  Aggravating factors: driving with equipment at my job Relieving factors: unsure, difficult to tell   PRECAUTIONS: None  WEIGHT BEARING RESTRICTIONS: No  FALLS:  Has patient fallen in last 6 months? 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  NEXT MD VISIT: Lumbar June 13th  OBJECTIVE:   DIAGNOSTIC FINDINGS:  Lumbar Radiograph on 02/09/2023: IMPRESSION: 1. Mild broad-based dextroscoliotic curvature centered at L4-L5. 2. Trace retrolisthesis of L2 on L3, unchanged on flexion and extension. 3. Mild L2-L3 degenerative disc disease. Mild L4-L5 facet hypertrophy.  Pelvic Radiograph on 02/09/2023: IMPRESSION: Bilateral hip osteoarthritis, right greater than left.  Left ankle radiograph on 02/09/2023: IMPRESSION: 1. Lucency involving the medial talar dome may represent an osteochondral lesion. No evidence of subchondral sclerosis or collapse. This could be further assessed with MRI as clinically indicated. 2. Mild tibial talar spurring. 3. Moderate-sized plantar calcaneal spur and Achilles tendon enthesophyte.  PATIENT SURVEYS:  Eval:  FOTO 31% (projected 52% by visit 13)  SCREENING FOR RED FLAGS: Bowel or bladder incontinence: No Spinal tumors:  No Cauda equina syndrome: No Compression fracture: No Abdominal aneurysm: No  COGNITION: Overall cognitive status: Within functional limits for tasks  assessed     SENSATION: WFL  MUSCLE LENGTH: Hamstrings: tight bilaterally  POSTURE: rounded shoulders, forward head, and flexed trunk   PALPATION: Tender to palpation with spasms noted in lumbar multifidi, left piriformis  LUMBAR ROM:   Eval:  Limited at least 50% with pain   LOWER EXTREMITY MMT:    Eval: Right LE is WFL Left hip strength grossly 4+ to 5-/5, Left quad is 5-/5, left hamstring 4/5  LUMBAR SPECIAL TESTS:  Slump test: Positive on left side  FUNCTIONAL TESTS:  Eval: 5 times sit to stand: 32.82 sec with increased pain noted Timed up and go (TUG): 18 sec  05/03/23: 5 times sit to stand: 22.17 sec with no pain noted Timed up and go (TUG): 10.67 sec  GAIT: Distance walked: > 100 ft Assistive device utilized: None Level of assistance: Complete Independence Comments: Antalgic gait.  Pt reports that "if I'm not on pain medicine it's [pain] instant"  TODAY'S TREATMENT:                                                                                                                              DATE: 05/19/23 Nustep x 5 min level 5 (PT present to discuss status and progress) Standing hamstring stretch 3 x 30 sec at steps Standing hip flexor/quad stretch 3 x 30 sec Rocker board x 2 min for ankle mobility and achilles stretch Hooklying PPT x 20 Hooklying PPT with 90/90 heel tap x 20 Hooklying PPT with dying bug x 20 Lower trunk rotation x 20 Open book x 10 each side pausing 2-3 seconds Star crunch x 10 each side (20 total alternating) Ice to low back and heat to neck due to some neck pain reported after star crunch x 10 min  DATE: 05/03/23 Nustep x 5 min level 5 (PT present to discuss status and progress) Standing hamstring stretch 3 x 30 sec at steps Standing hip flexor/quad stretch 3 x 30 sec Hooklying PPT  x 20 Hooklying PPT with 90/90 heel tap x 20 Hooklying PPT with dying bug x 20 Lower trunk rotation x 20 Open book x 10 each side   03/28/23 TherEx [place] with L LE closer to glutes x10 PPT x6 max multimodal cues   03/03/2023  Review of HEP (see below).  Required cuing on LAQ to emphasis hold at terminal knee extension for functional/dynamic hamstring stretch   PATIENT EDUCATION:  Education details: Issued HEP and educated about dry needling Person educated: Patient Education method: Explanation, Demonstration, and Handouts Education comprehension: verbalized understanding and returned demonstration  HOME EXERCISE PROGRAM: Access Code: [email or web address] URL: [email or web address] Date: 05/03/2023 Prepared by: Mikey Kirschner  Exercises - Seated Hamstring Stretch  - 1 x daily - 7 x weekly - 1 sets - 2 reps - 20 sec hold - Seated Scapular Retraction  - 1 x daily - 7 x weekly - 2 sets - 10 reps - Seated Long Arc Quad  - 1 x daily -  7 x weekly - 2 sets - 10 reps - Seated Transversus Abdominis Bracing  - 1 x daily - 7 x weekly - 2 sets - 10 reps - Correct Seated Posture  - 1 x daily - 7 x weekly - 1 sets - 3 reps - Standing Hamstring Stretch on Chair  - 1 x daily - 7 x weekly - 1 sets - 3 reps - 30 hold - Standing Quad Stretch with Table and Chair Support  - 1 x daily - 7 x weekly - 1 sets - 3 reps - 30 sec hold - Supine Posterior Pelvic Tilt  - 1 x daily - 7 x weekly - 1 sets - 20 reps - Supine 90/90 Alternating Heel Touches with Posterior Pelvic Tilt  - 1 x daily - 7 x weekly - 1 sets - 20 reps - Supine Dead Bug with Leg Extension  - 1 x daily - 7 x weekly - 1 sets - 20 reps  ASSESSMENT:  CLINICAL IMPRESSION: Binh is responding to current plan of care.  His pain level remains low.  He has resumed work and has not had any significant exacerbations so far. We added star crunch to challenge his core and he did very well with this.  He did report some neck discomfort after  star crunch but this is likely due to weakness.  Overall, he is doing very well.  He would benefit from continuing skilled PT for LE flexibility, posture and body mechanics instruction and core stabilization.    OBJECTIVE IMPAIRMENTS: decreased balance, difficulty walking, decreased strength, increased muscle spasms, impaired flexibility, improper body mechanics, postural dysfunction, and pain.   ACTIVITY LIMITATIONS: carrying, lifting, bending, standing, and squatting  PARTICIPATION LIMITATIONS: community activity and occupation  PERSONAL FACTORS: Profession and 3+ comorbidities: OA, gout,   are also affecting patient's functional outcome.   REHAB POTENTIAL: Good  CLINICAL DECISION MAKING: Evolving/moderate complexity  EVALUATION COMPLEXITY: Moderate   GOALS: Goals reviewed with patient? Yes  SHORT TERM GOALS: Target date: 03/24/2023  Pt will be independent with initial HEP. Baseline: Goal status: MET 05/03/23  2.  Patient will report at least a 30% improvement in pain and other symptoms. Baseline:  Goal status: MET 05/03/23  LONG TERM GOALS: Target date: 06/22/2023  Patient will be independent with advanced HEP. Baseline:  Goal status: INITIAL  2.  Pt will improve FOTO to at least 52% to demonstrate improvements in functional activities. Baseline: 31% Goal status: INITIAL  3.  Patient will be able to report ability to ambulate for more than 10 minutes without increased pain reported to allow for community activities. Baseline:  Goal status: INITIAL  4.  Patient will improve 5 times sit to stand to 20 seconds or less without increased pain to demonstrate improved functional strength. Baseline: 32.82 seconds with UE use required and increased pain Goal status: INITIAL  5.  Patient will be able to demonstrate improved postural awareness and body mechanics with lifting throughout PT session. Baseline:  Goal status: INITIAL  PLAN:  PT FREQUENCY: 1-2x/week  PT  DURATION: 8 weeks  PLANNED INTERVENTIONS: Therapeutic exercises, Therapeutic activity, Neuromuscular re-education, Balance training, Gait training, Patient/Family education, Self Care, Joint mobilization, Joint manipulation, Stair training, Aquatic Therapy, Dry Needling, Electrical stimulation, Spinal manipulation, Spinal mobilization, Cryotherapy, Moist heat, Taping, Traction, Ultrasound, Ionotophoresis 4mg /ml Dexamethasone, Manual therapy, and Re-evaluation.  PLAN FOR NEXT SESSION: Progress HEP as indicated, strengthening, focus on core stability, manual/dry needling as indicated   B. , PT 05/19/23 8:52 AM  [facility] [address], [address] [place], [place] [address] Phone # [phone] Fax [phone]

## 2023-05-24 ENCOUNTER — Ambulatory Visit: Payer: Managed Care, Other (non HMO)

## 2023-05-31 ENCOUNTER — Ambulatory Visit: Payer: Managed Care, Other (non HMO)

## 2023-05-31 DIAGNOSIS — R262 Difficulty in walking, not elsewhere classified: Secondary | ICD-10-CM

## 2023-05-31 DIAGNOSIS — M5442 Lumbago with sciatica, left side: Secondary | ICD-10-CM | POA: Diagnosis not present

## 2023-05-31 DIAGNOSIS — R252 Cramp and spasm: Secondary | ICD-10-CM

## 2023-05-31 DIAGNOSIS — M6281 Muscle weakness (generalized): Secondary | ICD-10-CM

## 2023-05-31 NOTE — Therapy (Addendum)
 OUTPATIENT PHYSICAL THERAPY THORACOLUMBAR TREATMENT PHYSICAL THERAPY DISCHARGE SUMMARY  Visits from Start of Care: 5  Current functional level related to goals / functional outcomes: See below   Remaining deficits: See below   Education / Equipment: See below   Patient agrees to discharge. Patient goals were partially met. Patient is being discharged due to not returning since the last visit.    Patient Name: Francis Lin MRN: [id number] DOB:1976-04-04, 47 y.o., male Today's Date: 05/31/2023  END OF SESSION:  PT End of Session - 05/31/23 1646     Visit Number 5    Date for PT Re-Evaluation 06/28/23    Authorization Type cigna    PT Start Time 1615    PT Stop Time 1700    PT Time Calculation (min) 45 min    Activity Tolerance Patient tolerated treatment well    Behavior During Therapy WFL for tasks assessed/performed              Past Medical History:  Diagnosis Date   Acid reflux    Allergy    Anxiety    stress    Clotting disorder (HCC)    right leg x 2 in calf and behind knee    DVT (deep venous thrombosis) (HCC)    Esophagitis    Gout    Hypersomnia, persistent    IBS (irritable bowel syndrome)    PONV (postoperative nausea and vomiting)    nausea with no vomiting    Renal disorder    Shifting sleep-work schedule, affecting sleep 06/20/2013   Snoring disorder 06/20/2013   Tubular adenoma of colon    Past Surgical History:  Procedure Laterality Date   COLON SURGERY     COLONOSCOPY     CYST REMOVAL NECK  04/17/2023   ESOPHAGEAL DILATION     HEMORRHOID BANDING     ORIF TIBIA & FIBULA FRACTURES Right    right leg surgery x 2 places- rod in place knee to ankle    UPPER GASTROINTESTINAL ENDOSCOPY     wisdom teeth     wrist surgery Right    Patient Active Problem List   Diagnosis Date Noted   OSA (obstructive sleep apnea) 06/20/2022   Episodic cluster headache, not intractable 06/20/2022   Sinus congestion 06/20/2022   Morbid obesity  (HCC) 06/20/2022   Episode of change in speech    Aphasia 05/10/2022   LUQ abdominal pain 11/03/2020   Generalized abdominal pain 11/03/2020   Altered bowel habits 11/03/2020   Nausea without vomiting 11/03/2020   Migraine without aura and without status migrainosus, not intractable 11/22/2018   Nonsustained ventricular tachycardia (HCC) 08/03/2018   Elevated serum creatinine 06/18/2018   Slow heart rate 06/06/2018   Shortness of breath 04/04/2018   Venous insufficiency 01/05/2017   Deep vein thrombosis (DVT) of popliteal vein of right lower extremity (HCC) 06/30/2016   S/P ORIF (open reduction internal fixation) fracture 06/22/2016   Multiple fractures of both lower extremities 05/31/2016   Elevated CK 05/17/2016   Fracture of right fibula 05/17/2016   Fracture of right tibia 05/17/2016   Fracture of tibia, left, closed 05/17/2016   Trauma 05/17/2016   Situational anxiety 08/28/2014   Unspecified vitamin D  deficiency 09/22/2013   Shifting sleep-work schedule, affecting sleep 06/20/2013   Snoring disorder 06/20/2013   Hypersomnia, persistent    History of esophageal stricture 04/16/2013   Pain in joint, ankle and foot 04/16/2013   Gout 04/16/2013   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   OBESITY,  CLASS II, BMI 35- 39.9 03/15/2012   Hypertriglyceridemia 03/15/2012   Atypical chest pain 12/02/2008   Dysphagia 12/02/2008    PCP: Micheal Wolm ORN, MD  REFERRING PROVIDER: Emiliano Leonce CROME, PA-C  REFERRING DIAG: [phone] (ICD-10-CM) - Acute left-sided low back pain with left-sided sciatica  Rationale for Evaluation and Treatment: Rehabilitation  THERAPY DIAG:  Low back pain with left-sided sciatica, unspecified back pain laterality, unspecified chronicity  Cramp and spasm  Muscle weakness (generalized)  Difficulty in walking, not elsewhere classified  ONSET DATE:     SUBJECTIVE:                                                                                                                                                                                            SUBJECTIVE STATEMENT:  Patient reports he worked 3rd shift last night.  Tired.   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  Not much time to exercise.  However, back pain is reported at 2-3/10.  He does report some right achilles pain.  He admits he had some issue with this in the past and did a cortisone injection.  It has eased off in the past few days.   PERTINENT HISTORY:  anxiety, depression, gout, IBS, hypertension, CKD  PAIN:  Are you having pain? Yes: NPRS scale: 2/10 Pain location: low back  Pain description: discomfort  Aggravating factors: driving with equipment at my job Relieving factors: unsure, difficult to tell   PRECAUTIONS: None  WEIGHT BEARING RESTRICTIONS: No  FALLS:  Has patient fallen in last 6 months? 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  NEXT MD VISIT: Lumbar June 13th  OBJECTIVE:   DIAGNOSTIC FINDINGS:  Lumbar Radiograph on 02/09/2023: IMPRESSION: 1. Mild broad-based dextroscoliotic curvature centered at L4-L5. 2. Trace retrolisthesis of L2 on L3, unchanged on flexion and extension. 3. Mild L2-L3 degenerative disc disease. Mild L4-L5 facet hypertrophy.  Pelvic Radiograph on 02/09/2023: IMPRESSION: Bilateral hip osteoarthritis, right greater than left.  Left ankle radiograph on 02/09/2023: IMPRESSION: 1. Lucency involving the medial talar dome may represent an osteochondral lesion. No evidence of subchondral sclerosis or collapse. This  could be further assessed with MRI as clinically indicated. 2. Mild tibial talar spurring. 3. Moderate-sized  plantar calcaneal spur and Achilles tendon enthesophyte.  PATIENT SURVEYS:  Eval:  FOTO 31% (projected 52% by visit 13)  SCREENING FOR RED FLAGS: Bowel or bladder incontinence: No Spinal tumors: No Cauda equina syndrome: No Compression fracture: No Abdominal aneurysm: No  COGNITION: Overall cognitive status: Within functional limits for tasks assessed     SENSATION: WFL  MUSCLE LENGTH: Hamstrings: tight bilaterally  POSTURE: rounded shoulders, forward head, and flexed trunk   PALPATION: Tender to palpation with spasms noted in lumbar multifidi, left piriformis  LUMBAR ROM:   Eval:  Limited at least 50% with pain   LOWER EXTREMITY MMT:    Eval: Right LE is WFL Left hip strength grossly 4+ to 5-/5, Left quad is 5-/5, left hamstring 4/5  LUMBAR SPECIAL TESTS:  Slump test: Positive on left side  FUNCTIONAL TESTS:  Eval: 5 times sit to stand: 32.82 sec with increased pain noted Timed up and go (TUG): 18 sec  05/03/23: 5 times sit to stand: 22.17 sec with no pain noted Timed up and go (TUG): 10.67 sec  GAIT: Distance walked: > 100 ft Assistive device utilized: None Level of assistance: Complete Independence Comments: Antalgic gait.  Pt reports that if I'm not on pain medicine it's [pain] instant  TODAY'S TREATMENT:                                                                                                                              DATE: 05/31/23 Nustep x 5 min level 5 (PT present to discuss status and progress) Standing hamstring stretch 3 x 30 sec at steps Standing hip flexor/quad stretch 3 x 30 sec Seated piriformis stretch 3 x 30 sec Hooklying PPT x 20 Hooklying PPT with 90/90 heel tap x 20 Hooklying PPT with dying bug x 20 Lower trunk rotation x 20 Quadruped bird dog x 20 (10 each side) Ball pass core exercise x 10 with red physio  ball Star crunch x 10 each side (20 total alternating)  DATE: 05/19/23 Nustep x 5 min level 5 (PT present to discuss status and progress) Standing hamstring stretch 3 x 30 sec at steps Standing hip flexor/quad stretch 3 x 30 sec Rocker board x 2 min for ankle mobility and achilles stretch Hooklying PPT x 20 Hooklying PPT with 90/90 heel tap x 20 Hooklying PPT with dying bug x 20 Lower trunk rotation x 20 Open book x 10 each side pausing 2-3 seconds Star crunch x 10 each side (20 total alternating) Ice to low back and heat to neck due to some neck pain reported after star crunch x 10 min  DATE: 05/03/23 Nustep x 5 min level 5 (PT present to discuss status and progress) Standing hamstring stretch 3 x 30 sec at steps Standing hip flexor/quad stretch 3 x 30 sec Hooklying PPT x 20 Hooklying PPT with 90/90 heel tap x 20 Hooklying PPT with dying bug x 20 Lower trunk rotation x 20 Open book x  10 each side   03/03/2023  Review of HEP (see below).  Required cuing on LAQ to emphasis hold at terminal knee extension for functional/dynamic hamstring stretch   PATIENT EDUCATION:  Education details: Issued HEP and educated about dry needling Person educated: Patient Education method: Explanation, Demonstration, and Handouts Education comprehension: verbalized understanding and returned demonstration  HOME EXERCISE PROGRAM: Access Code: [email or web address] URL: [email or web address] Date: 05/03/2023 Prepared by: Delon Haddock  Exercises - Seated Hamstring Stretch  - 1 x daily - 7 x weekly - 1 sets - 2 reps - 20 sec hold - Seated Scapular Retraction  - 1 x daily - 7 x weekly - 2 sets - 10 reps - Seated Long Arc Quad  - 1 x daily - 7 x weekly - 2 sets - 10 reps - Seated Transversus Abdominis Bracing  - 1 x daily - 7 x weekly - 2 sets - 10 reps - Correct Seated Posture  - 1 x daily - 7 x weekly - 1 sets - 3 reps - Standing Hamstring Stretch on Chair  - 1 x daily - 7 x weekly - 1 sets - 3  reps - 30 hold - Standing Quad Stretch with Table and Chair Support  - 1 x daily - 7 x weekly - 1 sets - 3 reps - 30 sec hold - Supine Posterior Pelvic Tilt  - 1 x daily - 7 x weekly - 1 sets - 20 reps - Supine 90/90 Alternating Heel Touches with Posterior Pelvic Tilt  - 1 x daily - 7 x weekly - 1 sets - 20 reps - Supine Dead Bug with Leg Extension  - 1 x daily - 7 x weekly - 1 sets - 20 reps  ASSESSMENT:  CLINICAL IMPRESSION: Doyne is progressing appropriately.  He continues to do very well with increasingly higher level core work.  He benefits from LE stretching.  He denies any pain in his back.  Instead, he seems to have primarily bilateral LE discomfort from knee down.  He has know circulatory issues.  His leg pain may be mostly due to this.  He was encouraged to stretch at work and avoid prolonged sitting.   He would benefit from continuing skilled PT for LE flexibility, posture and body mechanics instruction and core stabilization.    OBJECTIVE IMPAIRMENTS: decreased balance, difficulty walking, decreased strength, increased muscle spasms, impaired flexibility, improper body mechanics, postural dysfunction, and pain.   ACTIVITY LIMITATIONS: carrying, lifting, bending, standing, and squatting  PARTICIPATION LIMITATIONS: community activity and occupation  PERSONAL FACTORS: Profession and 3+ comorbidities: OA, gout,  are also affecting patient's functional outcome.   REHAB POTENTIAL: Good  CLINICAL DECISION MAKING: Evolving/moderate complexity  EVALUATION COMPLEXITY: Moderate   GOALS: Goals reviewed with patient? Yes  SHORT TERM GOALS: Target date: 03/24/2023  Pt will be independent with initial HEP. Baseline: Goal status: MET 05/03/23  2.  Patient will report at least a 30% improvement in pain and other symptoms. Baseline:  Goal status: MET 05/03/23  LONG TERM GOALS: Target date: 06/22/2023  Patient will be independent with advanced HEP. Baseline:  Goal status: IN  PROGRESS  2.  Pt will improve FOTO to at least 52% to demonstrate improvements in functional activities. Baseline: 31% Goal status: INITIAL  3.  Patient will be able to report ability to ambulate for more than 10 minutes without increased pain reported to allow for community activities. Baseline:  Goal status: IN PROGRESS  4.  Patient will improve 5 times sit to  stand to 20 seconds or less without increased pain to demonstrate improved functional strength. Baseline: 32.82 seconds with UE use required and increased pain Goal status: INITIAL  5.  Patient will be able to demonstrate improved postural awareness and body mechanics with lifting throughout PT session. Baseline:  Goal status: IN PROGRESS  PLAN:  PT FREQUENCY: 1-2x/week  PT DURATION: 8 weeks  PLANNED INTERVENTIONS: Therapeutic exercises, Therapeutic activity, Neuromuscular re-education, Balance training, Gait training, Patient/Family education, Self Care, Joint mobilization, Joint manipulation, Stair training, Aquatic Therapy, Dry Needling, Electrical stimulation, Spinal manipulation, Spinal mobilization, Cryotherapy, Moist heat, Taping, Traction, Ultrasound, Ionotophoresis 4mg /ml Dexamethasone, Manual therapy, and Re-evaluation.  PLAN FOR NEXT SESSION: Progress HEP as indicated, strengthening, focus on core stability, manual/dry needling as indicated  Andrue Dini B. Jettson Crable, PT 05/31/23 5:10 PM [facility] [address] [place], [place] [address] Phone # [phone] Fax [phone]

## 2023-06-06 NOTE — Progress Notes (Deleted)
[facility] [address] [place]. [place] [address] [phone]       OFFICE FOLLOW UP NOTE  [name] Date of Birth:  24-Jan-1976 Medical Record Number:  [id number]   Reason for visit: Initial CPAP follow-up    SUBJECTIVE:   CHIEF COMPLAINT:  No chief complaint on file.  Follow-up visit:  Prior visit: 11/28/2022  HPI:    Francis Lin is a 47 y.o. male who is followed for OSA.  Was seen by Dr. Vickey Huger 06/20/2022 for sleep evaluation with prior diagnosis of mild OSA in 2014 with complaints of snoring, daytime sleepiness and headaches associated with dizziness, blurred vision, photophobia, nausea and pressure behind eye.  Completed HST 08/03/2022 which showed mild to moderate OSA and recommend initiation of CPAP.  He was also started on topiramate at prior visit for migraine prophylaxis.  At prior visit, reported tolerating nasal pillow mask well with improvement of sleep quality and some improvement of daytime energy levels as well as improvement of headaches.   Interval history:     Reports tolerating CPAP well.  Initially using fullface mask but he did not tolerate well, now using nasal pillow with use of chin strap, tolerating better. Has noticed occasional increased saliva production with use of nasal pillow. Reports improvement of sleep quality and some improvement of daytime energy levels but still has some fatigue, believes this is more due to working 3rd shift. Improvement of headaches since starting CPAP, has not been taking topiramate as headaches improved.   Epworth Sleepiness Scale 9/24 (prior to CPAP 12/24)  Of note, blood pressure elevated today, asymptomatic.  He was encouraged to monitor at home and if remains elevated to follow-up with PCP and nephrologist for further recommendations.              ROS:   14 system review of systems performed and negative with exception of those listed in HPI  PMH:  Past  Medical History:  Diagnosis Date   Acid reflux    Allergy    Anxiety    stress    Clotting disorder (HCC)    right leg x 2 in calf and behind knee    DVT (deep venous thrombosis) (HCC)    Esophagitis    Gout    Hypersomnia, persistent    IBS (irritable bowel syndrome)    PONV (postoperative nausea and vomiting)    nausea with no vomiting    Renal disorder    Shifting sleep-work schedule, affecting sleep 06/20/2013   Snoring disorder 06/20/2013   Tubular adenoma of colon     PSH:  Past Surgical History:  Procedure Laterality Date   COLON SURGERY     COLONOSCOPY     CYST REMOVAL NECK  04/17/2023   ESOPHAGEAL DILATION     HEMORRHOID BANDING     ORIF TIBIA & FIBULA FRACTURES Right    right leg surgery x 2 places- rod in place knee to ankle    UPPER GASTROINTESTINAL ENDOSCOPY     wisdom teeth     wrist surgery Right     Social History:  Social History   Socioeconomic History   Marital status: Married    Spouse name: Not on file   Number of children: 3   Years of education: college   Highest education level: Not on file  Occupational History    Employer: [employer]    Comment: [employer]  Tobacco Use   Smoking status: Never  Passive exposure: Past   Smokeless tobacco: Never  Vaping Use   Vaping status: Never Used  Substance and Sexual Activity   Alcohol use: No   Drug use: No   Sexual activity: Not on file  Other Topics Concern   Not on file  Social History Narrative   Right handed   Caffeine use: tea sometimes   Social Determinants of Health   Financial Resource Strain: Not on file  Food Insecurity: Not on file  Transportation Needs: Not on file  Physical Activity: Not on file  Stress: Not on file  Social Connections: Unknown (02/28/2023)   Received from [facility], [facility]   Social Network    Social Network: Not on file  Intimate Partner Violence: Unknown (02/28/2023)   Received from [employer], [facility]   HITS     Physically Hurt: Not on file    Insult or Talk Down To: Not on file    Threaten Physical Harm: Not on file    Scream or Curse: Not on file    Family History:  Family History  Problem Relation Age of Onset   Lupus Mother    Heart attack Father    Arthritis Father    Hypertension Father    Cancer Father        multiple myeloma   Diabetes Father    Atrial fibrillation Father    Gout Father    Kidney failure Father    Edema Sister    Diabetes Sister    Bipolar disorder Sister    Stroke Sister    Obesity Sister    Adrenal disorder Sister    Lupus Maternal Aunt    Eczema Daughter    ADD / ADHD Daughter    Asthma Daughter    Eczema Daughter    ADD / ADHD Daughter    Hypotension Daughter    Eczema Daughter    High Cholesterol Daughter    Colon cancer Neg Hx    Colon polyps Neg Hx    Esophageal cancer Neg Hx    Rectal cancer Neg Hx    Stomach cancer Neg Hx     Medications:   Current Outpatient Medications on File Prior to Visit  Medication Sig Dispense Refill   acetaminophen (TYLENOL) 325 MG tablet Take 650 mg by mouth every 6 (six) hours as needed for mild pain.     colchicine 0.6 MG tablet Take 0.6 mg 1 tablet by mouth daily. 90 tablet 0   cyanocobalamin 1000 MCG tablet Take 1 tablet (1,000 mcg total) by mouth daily.     febuxostat (ULORIC) 40 MG tablet Take 1 tablet (40 mg total) by mouth daily. 30 tablet 2   fluocinolone (VANOS) 0.01 % cream Apply 1 Application topically 2 (two) times daily as needed (Itching).     fluticasone (FLONASE) 50 MCG/ACT nasal spray Place 1 spray into both nostrils daily as needed for allergies or rhinitis.     loratadine (CLARITIN) 10 MG tablet Take 10 mg by mouth daily as needed for allergies.     methocarbamol (ROBAXIN) 500 MG tablet Take 1 tablet (500 mg total) by mouth 4 (four) times daily. 30 tablet 3   nystatin cream (MYCOSTATIN) Apply 1 Application topically 2 (two) times daily as needed for dry skin (Itching). & triamcinolone  compounded cream     topiramate (TOPAMAX) 25 MG tablet Take 1 tablet (25 mg total) by mouth at bedtime for 7 days, THEN 2 tablets (50 mg total) at bedtime  for 21 days. 50 tablet 0   [DISCONTINUED] cimetidine (TAGAMET) 200 MG tablet Take by mouth.     No current facility-administered medications on file prior to visit.    Allergies:   Allergies  Allergen Reactions   Peanuts [Peanut Oil] Anaphylaxis   Lactose Diarrhea and Other (See Comments)    Lactose intolerance Lactose intolerance Lactose intolerance   Allopurinol     rash   Caffeine Other (See Comments)    "makes feel bad", headaches   Gabapentin Other (See Comments)    Messes with memory   Ibuprofen Other (See Comments)    Early onset of kidney issues (Stage 3A)   Omeprazole Other (See Comments)    Mood swing   Penicillins Other (See Comments)    Headaches    Seasonal Ic [Cholestatin]     Sneezing,runny nose , itchy eyes   Tape     Adhesive tape- caused rash      OBJECTIVE:  Physical Exam  There were no vitals filed for this visit.  There is no height or weight on file to calculate BMI. No results found.   General: well developed, well nourished, pleasant middle-aged African-American male, seated, in no evident distress Head: head normocephalic and atraumatic.   Neck: supple with no carotid or supraclavicular bruits Cardiovascular: regular rate and rhythm, no murmurs Musculoskeletal: no deformity Skin:  no rash/petichiae Vascular:  Normal pulses all extremities   Neurologic Exam Mental Status: Awake and fully alert. Oriented to place and time. Recent and remote memory intact. Attention span, concentration and fund of knowledge appropriate. Mood and affect appropriate.  Cranial Nerves: Pupils equal, briskly reactive to light. Extraocular movements full without nystagmus. Visual fields full to confrontation. Hearing intact. Facial sensation intact. Face, tongue, palate moves normally and symmetrically.   Motor: Normal bulk and tone. Normal strength in all tested extremity muscles Sensory.: intact to touch , pinprick , position and vibratory sensation.  Coordination: Rapid alternating movements normal in all extremities. Finger-to-nose and heel-to-shin performed accurately bilaterally. Gait and Station: Arises from chair without difficulty. Stance is normal. Gait demonstrates normal stride length and balance without use of AD.  Reflexes: 1+ and symmetric. Toes downgoing.         ASSESSMENT/PLAN: Francis Lin is a 47 y.o. year old male    OSA on CPAP : Compliance report shows satisfactory usage with optimal residual AHI.  Continue current pressure settings.  Discussed increased nightly usage with ensuring greater than 4 hours nightly for optimal benefit and per insurance purposes.  Continue to follow with DME company for any needed supplies or CPAP related concerns    Follow up in 6 months or call earlier if needed   CC:  PCP: Kristian Covey, MD    I spent 22 minutes of face-to-face and non-face-to-face time with patient.  This included previsit chart review, lab review, study review, order entry, electronic health record documentation, patient education regarding diagnosis of sleep apnea with review and discussion of compliance report and answered all other questions to patient's satisfaction   Ihor Austin, [facility]  [facility] [address] [place], [place] [address]  Phone [phone] Fax [phone] Note: This document was prepared with digital dictation and possible smart phrase technology. Any transcriptional errors that result from this process are unintentional.

## 2023-06-07 ENCOUNTER — Ambulatory Visit: Payer: Managed Care, Other (non HMO)

## 2023-06-07 ENCOUNTER — Ambulatory Visit: Payer: Managed Care, Other (non HMO) | Admitting: Adult Health

## 2023-06-14 ENCOUNTER — Telehealth: Payer: Self-pay | Admitting: Family Medicine

## 2023-06-14 ENCOUNTER — Encounter: Payer: Self-pay | Admitting: Family Medicine

## 2023-06-14 ENCOUNTER — Ambulatory Visit: Payer: Managed Care, Other (non HMO) | Admitting: Family Medicine

## 2023-06-14 VITALS — BP 134/80 | HR 73 | Temp 97.5°F | Ht 74.0 in | Wt 331.1 lb

## 2023-06-14 DIAGNOSIS — D100 Benign neoplasm of lip: Secondary | ICD-10-CM | POA: Diagnosis not present

## 2023-06-14 DIAGNOSIS — R253 Fasciculation: Secondary | ICD-10-CM

## 2023-06-14 DIAGNOSIS — R079 Chest pain, unspecified: Secondary | ICD-10-CM | POA: Diagnosis not present

## 2023-06-14 DIAGNOSIS — Z833 Family history of diabetes mellitus: Secondary | ICD-10-CM

## 2023-06-14 DIAGNOSIS — R252 Cramp and spasm: Secondary | ICD-10-CM | POA: Diagnosis not present

## 2023-06-14 NOTE — Telephone Encounter (Signed)
Pt was just seen by MD.  Pt called to say he would like to take MD up on his offer to see a specialist, as per discussion that was just had, regarding his lip.

## 2023-06-14 NOTE — Progress Notes (Unsigned)
Established Patient Office Visit  Subjective   Patient ID: Francis Lin, male    DOB: 02-Feb-1976  Age: 47 y.o. MRN: [id number]  Chief Complaint  Patient presents with   Lip Laceration    Patient complains of lip laceration, x1.5 months     HPI  {History (Optional):23778} Francis Lin is seen for the following issues  He states about 6 weeks ago he inadvertently bit his left lower lip.  Since that time has had small fibromatous lesion left lower lip.  This is irritating because of location.  No history of nicotine use.  He has had some recent atypical chest pains.  Went to the ER in July for this.  Studies there were unrevealing.  He had workup in 2021 with nuclear stress test and echo which were unremarkable.  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  Had some recent atypical sharp substernal chest pain.  No consistent chest pain with exercise.  He describes some muscle fasciculations left biceps but no left arm heaviness.  No recent diaphoresis or nausea.  No known family history of premature CAD.  His father did have history of atrial fibrillation.  He also relates some recent leg cramps especially in his calf muscles.  These are intermittent.  Denies any claudication type symptoms.  The ASCVD Risk score (Arnett DK, et al., 2019) failed to calculate for the following reasons:   Cannot find a previous HDL lab   Cannot find a previous total cholesterol lab   Past Medical History:  Diagnosis Date   Acid reflux    Allergy    Anxiety    stress    Clotting disorder (HCC)    right leg x 2 in calf and behind knee    DVT (deep venous thrombosis) (HCC)    Esophagitis    Gout    Hypersomnia, persistent    IBS (irritable bowel syndrome)    PONV (postoperative nausea and vomiting)    nausea with no vomiting    Renal disorder    Shifting sleep-work schedule, affecting sleep 06/20/2013   Snoring disorder 06/20/2013   Tubular adenoma of colon    Past Surgical  History:  Procedure Laterality Date   COLON SURGERY     COLONOSCOPY     CYST REMOVAL NECK  04/17/2023   ESOPHAGEAL DILATION     HEMORRHOID BANDING     ORIF TIBIA & FIBULA FRACTURES Right    right leg surgery x 2 places- rod in place knee to ankle    UPPER GASTROINTESTINAL ENDOSCOPY     wisdom teeth     wrist surgery Right     reports that he has never smoked. He has been exposed to tobacco smoke. He has never used smokeless tobacco. He reports that he does not drink alcohol and does not use drugs. family history includes ADD / ADHD in his daughter and daughter; Adrenal disorder in his sister; Arthritis in his father; Asthma in his daughter; Atrial fibrillation in his father; Bipolar disorder in his sister; Cancer in his father; Diabetes in his father and sister; Eczema in his daughter, daughter, and daughter; Edema in his sister; Gout in his father; Heart attack in his father; High Cholesterol in his daughter; Hypertension in his father; Hypotension in his daughter; Kidney failure in his father; Lupus in his maternal aunt and mother; Obesity in his sister; Stroke in his sister. Allergies  Allergen Reactions   Peanuts [Peanut Oil] Anaphylaxis   Lactose  Diarrhea and Other (See Comments)    Lactose intolerance Lactose intolerance Lactose intolerance   Allopurinol     rash   Caffeine Other (See Comments)    "makes feel bad", headaches   Gabapentin Other (See Comments)    Messes with memory   Ibuprofen Other (See Comments)    Early onset of kidney issues (Stage 3A)   Omeprazole Other (See Comments)    Mood swing   Penicillins Other (See Comments)    Headaches    Seasonal Ic [Cholestatin]     Sneezing,runny nose , itchy eyes   Tape     Adhesive tape- caused rash    Review of Systems  Constitutional:  Negative for malaise/fatigue.  Eyes:  Negative for blurred vision.  Respiratory:  Negative for shortness of breath.   Cardiovascular:  Negative for chest pain.  Neurological:   Negative for dizziness, weakness and headaches.      Objective:     BP 134/80 (BP Location: Left Arm, Patient Position: Sitting, Cuff Size: Normal)   Pulse 73   Temp (!) 97.5 F (36.4 C) (Oral)   Ht 6\' 2"  (1.88 m)   Wt (!) 331 lb 1.6 oz (150.2 kg)   SpO2 98%   BMI 42.51 kg/m  {Vitals History (Optional):23777}  Physical Exam Vitals reviewed.  Constitutional:      Appearance: He is well-developed.  HENT:     Right Ear: External ear normal.     Left Ear: External ear normal.     Mouth/Throat:     Comments: Small fibromatous lesion left lower inner lip which is about 4 mm diameter at the base. Eyes:     Pupils: Pupils are equal, round, and reactive to light.  Neck:     Thyroid: No thyromegaly.  Cardiovascular:     Rate and Rhythm: Normal rate and regular rhythm.  Pulmonary:     Effort: Pulmonary effort is normal. No respiratory distress.     Breath sounds: Normal breath sounds. No wheezing or rales.  Musculoskeletal:     Cervical back: Neck supple.     Right lower leg: No edema.     Left lower leg: No edema.  Neurological:     Mental Status: He is alert and oriented to person, place, and time.      No results found for any visits on 06/14/23.  {Labs (Optional):23779}  The ASCVD Risk score (Arnett DK, et al., 2019) failed to calculate for the following reasons:   Cannot find a previous HDL lab   Cannot find a previous total cholesterol lab    Assessment & Plan:   #1 recent atypical chest pains.  Previous nuclear stress test and echocardiogram 2021 unremarkable.  We discussed setting up coronary calcium score for further restratification and he would like to proceed with that.  Also need lipid panel but not fasting today  #2 small traumatic fibroma left lower lip.  Recommend referral to oral surgeon or ENT.  Does not have any atypical features but very irritating because of location.  He would like to get this excised  #3 intermittent leg cramps.  Check basic  metabolic panel and magnesium level.  #4 family history of type 2 diabetes in his father.  Recheck A1c  #5 benign fasciculations right bicep.  No further evaluation.   No follow-ups on file.    Evelena Peat, MD

## 2023-06-15 LAB — MAGNESIUM: Magnesium: 1.9 mg/dL (ref 1.5–2.5)

## 2023-06-15 LAB — BASIC METABOLIC PANEL
BUN: 12 mg/dL (ref 6–23)
CO2: 27 meq/L (ref 19–32)
Calcium: 9 mg/dL (ref 8.4–10.5)
Chloride: 105 meq/L (ref 96–112)
Creatinine, Ser: 1.42 mg/dL (ref 0.40–1.50)
GFR: 59.07 mL/min — ABNORMAL LOW (ref >60.00)
Glucose, Bld: 79 mg/dL (ref 70–99)
Potassium: 4.2 meq/L (ref 3.5–5.1)
Sodium: 138 meq/L (ref 135–145)

## 2023-06-15 LAB — HEMOGLOBIN A1C: Hgb A1c MFr Bld: 5.5 % (ref 4.6–6.5)

## 2023-06-16 ENCOUNTER — Ambulatory Visit: Payer: Managed Care, Other (non HMO) | Admitting: Rehabilitative and Restorative Service Providers"

## 2023-06-16 NOTE — Telephone Encounter (Signed)
Left detailed message on mobile voicemail informing him that referral was placed.

## 2023-06-19 ENCOUNTER — Encounter: Payer: Managed Care, Other (non HMO) | Admitting: Rehabilitative and Restorative Service Providers"

## 2023-06-24 ENCOUNTER — Encounter (HOSPITAL_BASED_OUTPATIENT_CLINIC_OR_DEPARTMENT_OTHER): Payer: Self-pay | Admitting: Emergency Medicine

## 2023-06-24 ENCOUNTER — Other Ambulatory Visit: Payer: Self-pay

## 2023-06-24 ENCOUNTER — Emergency Department (HOSPITAL_BASED_OUTPATIENT_CLINIC_OR_DEPARTMENT_OTHER)
Admission: EM | Admit: 2023-06-24 | Discharge: 2023-06-24 | Disposition: A | Discharge status: Home / Self Care | Payer: Managed Care, Other (non HMO) | Attending: Emergency Medicine | Admitting: Emergency Medicine

## 2023-06-24 ENCOUNTER — Emergency Department (HOSPITAL_BASED_OUTPATIENT_CLINIC_OR_DEPARTMENT_OTHER): Payer: Managed Care, Other (non HMO)

## 2023-06-24 DIAGNOSIS — M25462 Effusion, left knee: Secondary | ICD-10-CM | POA: Insufficient documentation

## 2023-06-24 DIAGNOSIS — N189 Chronic kidney disease, unspecified: Secondary | ICD-10-CM | POA: Insufficient documentation

## 2023-06-24 DIAGNOSIS — Z91010 Allergy to peanuts: Secondary | ICD-10-CM | POA: Insufficient documentation

## 2023-06-24 DIAGNOSIS — M25562 Pain in left knee: Secondary | ICD-10-CM | POA: Diagnosis present

## 2023-06-24 MED ORDER — NAPROXEN 500 MG PO TABS: 500.0000 mg | ORAL_TABLET | Freq: Two times a day (BID) | ORAL | 0 refills | Status: AC

## 2023-06-24 MED ORDER — KETOROLAC TROMETHAMINE 60 MG/2ML IM SOLN
30.0000 mg | Freq: Once | INTRAMUSCULAR | Status: AC
  Administered 2023-06-24: 30 mg via INTRAMUSCULAR
  Filled 2023-06-24: qty 2

## 2023-06-24 MED ORDER — ACETAMINOPHEN 500 MG PO TABS
1000.0000 mg | ORAL_TABLET | Freq: Once | ORAL | Status: DC
  Filled 2023-06-24: qty 2

## 2023-06-24 NOTE — Discharge Instructions (Addendum)
I would like you to take naproxen 2 times per day for the next 1 week.  Make sure you are drinking plenty of water as this will help protect your kidneys.  Short-term doses of these medications should not cause you any long-term problems.  Follow-up with your primary care doctor.

## 2023-06-24 NOTE — ED Triage Notes (Addendum)
Patient presents to ED via POV from home. Here with left knee pain x 2 days. Reports over extending it at work. Reports pain is at the back of his knee. History of DVT.

## 2023-06-24 NOTE — ED Provider Notes (Signed)
[facility] Provider Note   CSN: [id number] Arrival date & time: 06/24/23  1851     History  Chief Complaint  Patient presents with   Knee Pain    Francis Lin is a 47 y.o. male.  This is a 47 year old male who is here today with left knee pain.  Patient says that he was at work a couple of days ago and feels as though he hyperextended it.  He also has a history of gout, and says often times when he has traumatic injuries, he develops gout flares in those areas.   Knee Pain      Home Medications Prior to Admission medications   Medication Sig Start Date End Date Taking? Authorizing Provider  naproxen (NAPROSYN) 500 MG tablet Take 1 tablet (500 mg total) by mouth 2 (two) times daily for 7 days. 06/24/23 07/01/23 Yes Arletha Pili, DO  acetaminophen (TYLENOL) 325 MG tablet Take 650 mg by mouth every 6 (six) hours as needed for mild pain.    [provider]  colchicine 0.6 MG tablet Take 0.6 mg 1 tablet by mouth daily. 10/07/22   Gearldine Bienenstock, PA-C  cyanocobalamin 1000 MCG tablet Take 1 tablet (1,000 mcg total) by mouth daily. 05/13/22   Lonia Blood, MD  febuxostat (ULORIC) 40 MG tablet Take 1 tablet (40 mg total) by mouth daily. 10/07/22   Gearldine Bienenstock, PA-C  fluocinolone (VANOS) 0.01 % cream Apply 1 Application topically 2 (two) times daily as needed (Itching).    [provider]  fluticasone (FLONASE) 50 MCG/ACT nasal spray Place 1 spray into both nostrils daily as needed for allergies or rhinitis.    [provider]  loratadine (CLARITIN) 10 MG tablet Take 10 mg by mouth daily as needed for allergies.    [provider]  methocarbamol (ROBAXIN) 500 MG tablet Take 1 tablet (500 mg total) by mouth 4 (four) times daily. 03/29/23   Huel Cote, MD  nystatin cream (MYCOSTATIN) Apply 1 Application topically 2 (two) times daily as needed for dry skin (Itching). & triamcinolone  compounded cream    [provider]  topiramate (TOPAMAX) 25 MG tablet Take 1 tablet (25 mg total) by mouth at bedtime for 7 days, THEN 2 tablets (50 mg total) at bedtime for 21 days. 12/12/22 03/09/23  Ihor Austin, NP  cimetidine (TAGAMET) 200 MG tablet Take by mouth. 01/30/19 01/16/20  [provider]      Allergies    Peanuts [peanut oil], Lactose, Allopurinol, Caffeine, Gabapentin, Ibuprofen, Omeprazole, Penicillins, Seasonal ic [cholestatin], and Tape    Review of Systems   Review of Systems  Physical Exam Updated Vital Signs BP (!) 138/90   Pulse 92   Temp 98.2 F (36.8 C) (Oral)   Resp 18   Ht 6\' 2"  (1.88 m)   Wt (!) 149.7 kg   SpO2 98%   BMI 42.37 kg/m  Physical Exam Cardiovascular:     Comments: Palpable DP and PT pulses on the left side.  Warm extremities. Musculoskeletal:     Comments: There is swelling of the left knee, no overlying erythema.  Mild warmth.  Pain with passive range of motion.     ED Results / Procedures / Treatments   Labs (all labs ordered are listed, but only abnormal results are displayed) Labs Reviewed - No data to display  EKG None  Radiology DG Knee Complete 4 Views Left  Result Date: 06/24/2023 CLINICAL  DATA:  Acute onset left knee pain for 2-3 days. Possible hyperextension. EXAM: LEFT KNEE - COMPLETE 4+ VIEW COMPARISON:  Radiographs 11/02/2021 FINDINGS: No acute fracture or dislocation. Small knee joint effusion, similar to prior. Soft tissues are radiographically unremarkable. IMPRESSION: No acute fracture or dislocation. Electronically Signed   By: Minerva Fester M.D.   On: 06/24/2023 19:33    Procedures Procedures    Medications Ordered in ED Medications  ketorolac (TORADOL) injection 30 mg (has no administration in time range)  acetaminophen (TYLENOL) tablet 1,000 mg (has no administration in time range)    ED Course/ Medical Decision Making/ A&P                                 Medical Decision  Making This is a 47 year old male who is here today with left knee pain.  Differential diagnoses include knee effusion, traumatic knee effusion, gout flare, less likely infectious process, less likely vascular injury.  Plan # patient's symptoms are most consistent with a traumatic effusion.  Patient does have a history of mild chronic kidney disease.  Short-term NSAIDs will be beneficial in this particular patient.  Patient may be experiencing gout flare, he says he has had gout in this knee previously.  Will treat with some Toradol here, few days of relief.  Will the patient follow-up with his PCP.  Do not believe patient requires knee tap as symptoms are not consistent with infection.  Amount and/or Complexity of Data Reviewed Radiology: ordered.          Final Clinical Impression(s) / ED Diagnoses Final diagnoses:  Effusion of left knee    Rx / DC Orders ED Discharge Orders          Ordered    naproxen (NAPROSYN) 500 MG tablet  2 times daily        06/24/23 2045              Anders Simmonds T, DO 06/24/23 2045

## 2023-06-28 ENCOUNTER — Ambulatory Visit: Payer: Managed Care, Other (non HMO) | Admitting: Family Medicine

## 2023-06-30 ENCOUNTER — Ambulatory Visit (HOSPITAL_BASED_OUTPATIENT_CLINIC_OR_DEPARTMENT_OTHER)
Admission: RE | Admit: 2023-06-30 | Discharge: 2023-06-30 | Disposition: A | Discharge status: Home / Self Care | Payer: Managed Care, Other (non HMO) | Source: Ambulatory Visit | Attending: Family Medicine | Admitting: Family Medicine

## 2023-06-30 DIAGNOSIS — R079 Chest pain, unspecified: Secondary | ICD-10-CM | POA: Insufficient documentation

## 2023-07-05 ENCOUNTER — Telehealth: Payer: Self-pay | Admitting: Family Medicine

## 2023-07-05 NOTE — Telephone Encounter (Signed)
Please see results note.

## 2023-07-05 NOTE — Telephone Encounter (Signed)
Pt is calling and would like someone to go over the cardiac scoring test results

## 2023-07-11 ENCOUNTER — Encounter: Payer: Self-pay | Admitting: Family Medicine

## 2023-07-11 ENCOUNTER — Telehealth: Payer: Self-pay | Admitting: Family Medicine

## 2023-07-11 NOTE — Telephone Encounter (Signed)
Asking if Dr Caryl Never would make an exception and take his father in law on as a new patient. FIL is 47 years old.

## 2023-07-12 ENCOUNTER — Ambulatory Visit (INDEPENDENT_AMBULATORY_CARE_PROVIDER_SITE_OTHER): Payer: Managed Care, Other (non HMO) | Admitting: Pulmonary Disease

## 2023-07-12 ENCOUNTER — Encounter: Payer: Self-pay | Admitting: Pulmonary Disease

## 2023-07-12 VITALS — BP 127/82 | HR 69 | Ht 74.5 in | Wt 334.0 lb

## 2023-07-12 DIAGNOSIS — R911 Solitary pulmonary nodule: Secondary | ICD-10-CM | POA: Diagnosis not present

## 2023-07-12 DIAGNOSIS — R0602 Shortness of breath: Secondary | ICD-10-CM

## 2023-07-12 NOTE — Patient Instructions (Signed)
VISIT SUMMARY:  During your visit, we discussed your intermittent shortness of breath and chest discomfort, particularly after eating or exerting yourself. We also reviewed your history of elevated angiotensin converting enzyme levels and small lung nodules.   YOUR PLAN:  -LUNG NODULES: You have small nodules in your lungs that we believe are not harmful. We will continue to monitor these with a follow-up CT scan in March 2025.  -ELEVATED ANGIOTENSIN CONVERTING ENZYME (ACE): Your angiotensin converting enzyme levels are high, which can sometimes indicate a condition called sarcoidosis. However, your recent CT scans have not shown any signs of this condition, so no further action is needed at this time.  -SHORTNESS OF BREATH: Your occasional shortness of breath may be related to overexertion or meals. I recommend that you try to lose weight and improve your physical fitness to help alleviate these symptoms.  INSTRUCTIONS:  Please continue your current treatment and lifestyle modifications. We will follow up in 6 months, which will coincide with your repeat CT scan.

## 2023-07-12 NOTE — Progress Notes (Signed)
Francis Lin    [id number]    10/14/1975  Primary Care Physician:Burchette, Elberta Fortis, MD  Referring Physician: Kristian Covey, MD [address] [place],  [place] [address]  Chief complaint: Follow-up for abnormal CT, elevated angiotensin-converting enzyme.  Evaluation for sarcoidosis  HPI: 47 y.o. 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  He had a work-up which included elevated ACE level CT last year showed subcentimeter pulmonary nodules  Denies any dyspnea, cough.  No rash, vision or liver issues.  He had COVID infection in 2021 and 2022 but did not require hospitalization History of right leg crush injury in 2017 requiring multiple surgeries.  He had a DVT in the setting and was anticoagulated for about 4 months  Pets: No pets Occupation: Museum/gallery exhibitions officer Exposures: No mold, hot tub, Jacuzzi.  No feather pillows or comforter Smoking history: Never smoker Travel history: No significant travel history Relevant family history: No family history of lung disease  Interim history: Discussed the use of AI scribe software for clinical note transcription with the patient, who gave verbal consent to proceed.  The patient presents with intermittent shortness of breath and chest discomfort, particularly after eating or exerting himself. He acknowledges being out of shape and is unsure if these symptoms are due to his physical condition. He has a history of elevated angiotensin converting enzyme levels, suggestive of sarcoidosis, but recent CT scans have not shown any evidence of this condition. He also has small lung nodules that are being monitored with follow-up scans scheduled for March 2025.  Also has a thyroid nodule on CT scans and has  a thyroid ultrasound by his primary care and has already been referred to endocrinology.  Outpatient Encounter Medications as of 07/12/2023  Medication Sig   acetaminophen (TYLENOL) 325 MG tablet Take 650 mg by mouth every 6 (six) hours as needed for mild pain.   colchicine 0.6 MG tablet Take 0.6 mg 1 tablet by mouth daily.   cyanocobalamin 1000 MCG tablet Take 1 tablet (1,000 mcg total) by mouth daily.   febuxostat (ULORIC) 40 MG tablet Take 1 tablet (40 mg total) by mouth daily.   fluocinolone (VANOS) 0.01 % cream Apply 1 Application topically 2 (two) times daily as needed (Itching).   fluticasone (FLONASE) 50 MCG/ACT nasal spray Place 1 spray into both nostrils daily as needed for allergies or rhinitis.   loratadine (CLARITIN) 10 MG tablet Take 10 mg by mouth daily as needed for allergies.   methocarbamol (ROBAXIN) 500 MG tablet Take 1 tablet (500 mg total) by mouth 4 (four) times daily.   nystatin cream (MYCOSTATIN) Apply 1 Application topically 2 (two) times daily as needed for dry skin (Itching). & triamcinolone compounded cream   topiramate (TOPAMAX) 25 MG tablet Take 1 tablet (25 mg total) by mouth at bedtime for 7 days, THEN 2 tablets (50 mg total) at bedtime for 21 days.   [DISCONTINUED] cimetidine (TAGAMET) 200 MG tablet Take by mouth.   No facility-administered encounter medications on file as of 07/12/2023.   Physical Exam: Blood pressure 127/82, pulse 69, height 6' 2.5" (1.892 m), weight (!) 334 lb (151.5 kg), SpO2 99%. Gen:      No acute distress HEENT:  EOMI, sclera anicteric Neck:     No  masses; no thyromegaly Lungs:    Clear to auscultation bilaterally; normal respiratory effort CV:         Regular rate and rhythm; no murmurs Abd:      + bowel sounds; soft, non-tender; no palpable masses, no distension Ext:    No edema; adequate peripheral perfusion Skin:      Warm and dry; no rash Neuro: alert and oriented x 3 Psych: normal mood and affect   Data  Reviewed: Imaging: CT chest 06/15/2021-interval decrease in 9 mm left lower lobe pulmonary nodule with groundglass attenuation.  Scattered bilateral pulmonary nodules measuring up to 6 mm.  No lymphadenopathy.  CT high-resolution 11/22/2021-stable lung nodules.  Previously seen left lower lobe groundglass nodule not visualized.  No interstitial lung disease on lymphadenopathy.  CT chest 12/27/2022-3 new pulmonary nodules measuring 4 mm, clear lungs, 2.5 cm right thyroid nodule.  Cardiac CT 06/30/2023-visualized lungs are stable. I had reviewed the images personally.  PFTs: 12/28/2018 FVC 3.87 [77%], FEV1 3.20 [90%], F/F 83, TLC 6.24 [79%], DLCO 34.37 [99%] Minimal restriction, bronchodilator response with air trapping  Labs: Hepatic panel 11/02/2021-within normal limits Angiotensin-converting enzyme 11/02/2021-126 CCP, rheumatoid factor negative  Assessment:  Assessment for sarcoidosis Although he has elevated angiotensin-converting enzyme I suspect this is a nonspecific elevation.  His CT does not have any typical findings of sarcoidosis such as lymphadenopathy or peribronchovascular nodularity.  He does have a small groundglass nodules which has resolved on follow-up scan.  This is likely benign finding  PFTs show minimal restriction which is likely from body habitus without evidence of ILD on CT scan.  He does have a bronchodilator response and air trapping and suggestive of small airways disease but he is asymptomatic and does not need inhalers at present  Lung Nodules Small, likely benign lung nodules identified on CT scan. No history of smoking. -Scheduled follow-up CT scan in March 2025 to monitor nodules.  Shortness of Breath Occasional shortness of breath, possibly related to overexertion or meals. -Encouraged weight loss and physical fitness to improve symptoms.  Follow-up in 6 months, coinciding with repeat CT scan.   Plan/Recommendations: Follow-up CT in March  2025  Chilton Greathouse MD [facility] 07/12/2023, 4:26 PM  CC: Kristian Covey, MD

## 2023-07-21 NOTE — Telephone Encounter (Signed)
Noted  

## 2023-07-25 ENCOUNTER — Encounter (HOSPITAL_BASED_OUTPATIENT_CLINIC_OR_DEPARTMENT_OTHER): Payer: Self-pay | Admitting: Urology

## 2023-07-25 ENCOUNTER — Emergency Department (HOSPITAL_BASED_OUTPATIENT_CLINIC_OR_DEPARTMENT_OTHER)
Admission: EM | Admit: 2023-07-25 | Discharge: 2023-07-25 | Disposition: A | Discharge status: Home / Self Care | Payer: Managed Care, Other (non HMO) | Attending: Emergency Medicine | Admitting: Emergency Medicine

## 2023-07-25 ENCOUNTER — Emergency Department (HOSPITAL_BASED_OUTPATIENT_CLINIC_OR_DEPARTMENT_OTHER): Payer: Managed Care, Other (non HMO)

## 2023-07-25 ENCOUNTER — Other Ambulatory Visit: Payer: Self-pay

## 2023-07-25 DIAGNOSIS — R072 Precordial pain: Secondary | ICD-10-CM | POA: Insufficient documentation

## 2023-07-25 DIAGNOSIS — R110 Nausea: Secondary | ICD-10-CM | POA: Diagnosis present

## 2023-07-25 DIAGNOSIS — R1010 Upper abdominal pain, unspecified: Secondary | ICD-10-CM | POA: Diagnosis not present

## 2023-07-25 DIAGNOSIS — R519 Headache, unspecified: Secondary | ICD-10-CM | POA: Insufficient documentation

## 2023-07-25 DIAGNOSIS — Z91010 Allergy to peanuts: Secondary | ICD-10-CM | POA: Insufficient documentation

## 2023-07-25 DIAGNOSIS — Z1152 Encounter for screening for COVID-19: Secondary | ICD-10-CM | POA: Diagnosis not present

## 2023-07-25 LAB — BASIC METABOLIC PANEL
Anion gap: 11 (ref 5–15)
BUN: 16 mg/dL (ref 6–20)
CO2: 24 mmol/L (ref 22–32)
Calcium: 8.9 mg/dL (ref 8.9–10.3)
Chloride: 100 mmol/L (ref 98–111)
Creatinine, Ser: 1.5 mg/dL — ABNORMAL HIGH (ref 0.61–1.24)
GFR, Estimated: 57 mL/min — ABNORMAL LOW (ref >60)
Glucose, Bld: 99 mg/dL (ref 70–99)
Potassium: 4.1 mmol/L (ref 3.5–5.1)
Sodium: 135 mmol/L (ref 135–145)

## 2023-07-25 LAB — HEPATIC FUNCTION PANEL
ALT: 28 U/L (ref 0–44)
AST: 24 U/L (ref 15–41)
Albumin: 4.1 g/dL (ref 3.5–5.0)
Alkaline Phosphatase: 77 U/L (ref 38–126)
Bilirubin, Direct: 0.2 mg/dL (ref 0.0–0.2)
Indirect Bilirubin: 0.8 mg/dL (ref 0.3–0.9)
Total Bilirubin: 1 mg/dL (ref 0.3–1.2)
Total Protein: 7.7 g/dL (ref 6.5–8.1)

## 2023-07-25 LAB — CBC
HCT: 46.4 % (ref 39.0–52.0)
Hemoglobin: 15.5 g/dL (ref 13.0–17.0)
MCH: 29.8 pg (ref 26.0–34.0)
MCHC: 33.4 g/dL (ref 30.0–36.0)
MCV: 89.1 fL (ref 80.0–100.0)
Platelets: 230 10*3/uL (ref 150–400)
RBC: 5.21 MIL/uL (ref 4.22–5.81)
RDW: 14.1 % (ref 11.5–15.5)
WBC: 8 10*3/uL (ref 4.0–10.5)
nRBC: 0 % (ref 0.0–0.2)

## 2023-07-25 LAB — TROPONIN I (HIGH SENSITIVITY): Troponin I (High Sensitivity): 3 ng/L (ref <18)

## 2023-07-25 LAB — RESP PANEL BY RT-PCR (RSV, FLU A&B, COVID)  RVPGX2
Influenza A by PCR: NEGATIVE
Influenza B by PCR: NEGATIVE
Resp Syncytial Virus by PCR: NEGATIVE
SARS Coronavirus 2 by RT PCR: NEGATIVE

## 2023-07-25 LAB — LIPASE, BLOOD: Lipase: 23 U/L (ref 11–51)

## 2023-07-25 MED ORDER — ONDANSETRON HCL 4 MG/2ML IJ SOLN
4.0000 mg | Freq: Once | INTRAMUSCULAR | Status: AC
  Administered 2023-07-25: 4 mg via INTRAVENOUS
  Filled 2023-07-25 (×2): qty 2

## 2023-07-25 MED ORDER — ONDANSETRON 4 MG PO TBDP: 4.0000 mg | ORAL_TABLET | Freq: Three times a day (TID) | ORAL | 0 refills | Status: DC | PRN

## 2023-07-25 MED ORDER — KETOROLAC TROMETHAMINE 15 MG/ML IJ SOLN
15.0000 mg | Freq: Once | INTRAMUSCULAR | Status: AC
  Administered 2023-07-25: 15 mg via INTRAVENOUS
  Filled 2023-07-25: qty 1

## 2023-07-25 NOTE — ED Provider Notes (Signed)
Patient handoff from [place], [place]. Here with multiple complaints and awaiting PO challenge after PO Zofran given. Likely able to discharge home with home Zofran. Patient agreeable with plan. Physical Exam  BP 132/84   Pulse 68   Temp 97.8 F (36.6 C)   Resp 17   Ht 6\' 3"  (1.905 m)   Wt (!) 152.9 kg   SpO2 99%   BMI 42.12 kg/m   Physical Exam Vitals and nursing note reviewed.  Constitutional:      General: He is not in acute distress.    Appearance: He is well-developed.  HENT:     Head: Normocephalic and atraumatic.  Eyes:     Conjunctiva/sclera: Conjunctivae normal.  Cardiovascular:     Rate and Rhythm: Normal rate and regular rhythm.     Heart sounds: No murmur heard. Pulmonary:     Effort: Pulmonary effort is normal. No respiratory distress.     Breath sounds: Normal breath sounds.  Abdominal:     Palpations: Abdomen is soft.     Tenderness: There is no abdominal tenderness.  Musculoskeletal:        General: No swelling.     Cervical back: Neck supple.  Skin:    General: Skin is warm and dry.     Capillary Refill: Capillary refill takes less than 2 seconds.  Neurological:     Mental Status: He is alert.  Psychiatric:        Mood and Affect: Mood normal.     Procedures  Procedures  ED Course / MDM    Medical Decision Making Amount and/or Complexity of Data Reviewed Labs: ordered. Radiology: ordered.  Risk Prescription drug management.   Patient handoff from [place], [place]. Please see their note for full HPI and physical exam findings. Here with multiple complaints and awaiting PO challenge after PO Zofran given. Likely able to discharge home with home Zofran. Patient agreeable with plan.  Patient tolerating PO. Will discharge with instructions for close PCP follow up and strict return precautions. Patient discharged home in stable condition.       Smitty Knudsen, PA-C 07/25/23 Darin Engels, MD 07/25/23 [phone]

## 2023-07-25 NOTE — ED Notes (Signed)
Called lab to add Hepatic function and Lipase to current sample.

## 2023-07-25 NOTE — ED Triage Notes (Signed)
Pt states nausea since Sunday, went to work but had to come home Woke up this afternoon from a nap with headache, chest pain, and diarrhea Was shaky at home as well   Exposure to Flu at work

## 2023-07-25 NOTE — ED Provider Notes (Signed)
[facility] Provider Note   CSN: [id number] Arrival date & time: 07/25/23  1620     History  Chief Complaint  Patient presents with   Multiple Complaints     Francis Lin is a 47 y.o. male.  Patient with history of obstructive sleep apnea, recent evaluation by pulmonology, recent coronary CT with calcium score equals 0 --presents to the emergency department today for generalized malaise and nausea.  Patient started feeling poorly overnight last night.  After returning home he went to sleep and woke up with generalized headache, generalized chest pain and upper abdominal pain.  He has had some diarrhea.  No fevers.  No URI symptoms.  He has had shortness of breath, recently evaluated by pulmonology due to concern for sarcoidosis, but felt to be negative workup.  He had some generalized chills.  Reports exposure to someone at work with the flu.       Home Medications Prior to Admission medications   Medication Sig Start Date End Date Taking? Authorizing Provider  acetaminophen (TYLENOL) 325 MG tablet Take 650 mg by mouth every 6 (six) hours as needed for mild pain.    [provider]  colchicine 0.6 MG tablet Take 0.6 mg 1 tablet by mouth daily. 10/07/22   Gearldine Bienenstock, PA-C  cyanocobalamin 1000 MCG tablet Take 1 tablet (1,000 mcg total) by mouth daily. 05/13/22   Lonia Blood, MD  febuxostat (ULORIC) 40 MG tablet Take 1 tablet (40 mg total) by mouth daily. 10/07/22   Gearldine Bienenstock, PA-C  fluocinolone (VANOS) 0.01 % cream Apply 1 Application topically 2 (two) times daily as needed (Itching).    [provider]  fluticasone (FLONASE) 50 MCG/ACT nasal spray Place 1 spray into both nostrils daily as needed for allergies or rhinitis.    [provider]  loratadine (CLARITIN) 10 MG tablet Take 10 mg by mouth daily as needed for allergies.    [provider]  methocarbamol (ROBAXIN) 500 MG tablet  Take 1 tablet (500 mg total) by mouth 4 (four) times daily. 03/29/23   Huel Cote, MD  nystatin cream (MYCOSTATIN) Apply 1 Application topically 2 (two) times daily as needed for dry skin (Itching). & triamcinolone compounded cream    [provider]  topiramate (TOPAMAX) 25 MG tablet Take 1 tablet (25 mg total) by mouth at bedtime for 7 days, THEN 2 tablets (50 mg total) at bedtime for 21 days. 12/12/22 03/09/23  Ihor Austin, NP  cimetidine (TAGAMET) 200 MG tablet Take by mouth. 01/30/19 01/16/20  [provider]      Allergies    Peanuts [peanut oil], Lactose, Allopurinol, Caffeine, Gabapentin, Ibuprofen, Omeprazole, Penicillins, Seasonal ic [cholestatin], and Tape    Review of Systems   Review of Systems  Physical Exam Updated Vital Signs BP 132/84   Pulse 68   Temp 97.8 F (36.6 C)   Resp 17   Ht 6\' 3"  (1.905 m)   Wt (!) 152.9 kg   SpO2 99%   BMI 42.12 kg/m  Physical Exam Vitals and nursing note reviewed.  Constitutional:      General: He is not in acute distress.    Appearance: He is well-developed.  HENT:     Head: Normocephalic and atraumatic.     Right Ear: External ear normal.     Left Ear: External ear normal.     Nose: Nose normal.     Mouth/Throat:  Mouth: Mucous membranes are moist.  Eyes:     General:        Right eye: No discharge.        Left eye: No discharge.     Conjunctiva/sclera: Conjunctivae normal.  Cardiovascular:     Rate and Rhythm: Normal rate and regular rhythm.     Heart sounds: Normal heart sounds.  Pulmonary:     Effort: Pulmonary effort is normal.     Breath sounds: Normal breath sounds.  Abdominal:     Palpations: Abdomen is soft.     Tenderness: There is no abdominal tenderness. There is no guarding or rebound.  Musculoskeletal:        General: No swelling.     Cervical back: Normal range of motion and neck supple.     Right lower leg: No edema.     Left lower leg: No edema.  Skin:    General: Skin is warm  and dry.  Neurological:     Mental Status: He is alert.     ED Results / Procedures / Treatments   Labs (all labs ordered are listed, but only abnormal results are displayed) Labs Reviewed  BASIC METABOLIC PANEL - Abnormal; Notable for the following components:      Result Value   Creatinine, Ser 1.50 (*)    GFR, Estimated 57 (*)    All other components within normal limits  RESP PANEL BY RT-PCR (RSV, FLU A&B, COVID)  RVPGX2  CBC  HEPATIC FUNCTION PANEL  LIPASE, BLOOD  TROPONIN I (HIGH SENSITIVITY)    ED ECG REPORT   Date: 07/25/2023  Rate: 71  Rhythm: normal sinus rhythm  QRS Axis: normal  Intervals: normal  ST/T Wave abnormalities: normal  Conduction Disutrbances:none  Narrative Interpretation:   Old EKG Reviewed: unchanged  I have personally reviewed the EKG tracing and agree with the computerized printout as noted.   Radiology No results found.  Procedures Procedures    Medications Ordered in ED Medications  ondansetron (ZOFRAN) injection 4 mg (has no administration in time range)    ED Course/ Medical Decision Making/ A&P    Patient seen and examined. History obtained directly from patient.   Labs/EKG: Ordered CBC, BMP, troponin.  Added hepatic function panel and lipase.  Respiratory viral panel as well.  Imaging: Chest x-ray, has been performed, awaiting read.  Medications/Fluids: Ordered: IV Zofran.   Most recent vital signs reviewed and are as follows: BP 132/84   Pulse 68   Temp 97.8 F (36.6 C)   Resp 17   Ht 6\' 3"  (1.905 m)   Wt (!) 152.9 kg   SpO2 99%   BMI 42.12 kg/m   Initial impression: Nonspecific headache, chest pain, abdominal pain.  6:48 PM Reassessment performed. Patient appears stable.  He has had a few sips of water but is nauseous.  Labs personally reviewed and interpreted including: CBC unremarkable; BMP creatinine near baseline at 1.5, normal electrolytes; hepatic function panel unremarkable; lipase normal; troponin  normal.  Respiratory viral panel negative.  Awaiting x-ray read.  Reviewed pertinent lab work and imaging with patient at bedside. Questions answered.   Most current vital signs reviewed and are as follows: BP 132/84   Pulse 68   Temp 97.8 F (36.6 C)   Resp 17   Ht 6\' 3"  (1.905 m)   Wt (!) 152.9 kg   SpO2 99%   BMI 42.12 kg/m   Plan: I had a discussion on symptom treatment with patient and  family member by video chat.  Offered just IV Zofran, IV Zofran and 1 dose of Toradol, 15mg  due to patient's underlying kidney disease, or Reglan/Compazine and Benadryl.  After discussion of risks and side effects of each, patient would like to have Zofran and Toradol x 1.  Will order.  6:50 PM Signout to [facility] PA-C at shift change.   Will need to ensure he passes PO challenge. Plan d/c with symptom control. Zofran sent in.                                  Medical Decision Making Amount and/or Complexity of Data Reviewed Labs: ordered. Radiology: ordered.  Risk Prescription drug management.   In regards to the patient's headache, critical differentials were considered including subarachnoid hemorrhage, intracerebral hemorrhage, epidural/subdural hematoma, pituitary apoplexy, vertebral/carotid artery dissection, giant cell arteritis, central venous thrombosis, reversible cerebral vasoconstriction, acute angle closure glaucoma, idiopathic intracranial hypertension, bacterial meningitis, viral encephalitis, carbon monoxide poisoning, posterior reversible encephalopathy syndrome, pre-eclampsia.   Reg flag symptoms related to these causes were considered including systemic symptoms (fever, weight loss), neurologic symptoms (confusion, mental status change, vision change, associated seizure), acute or sudden "thunderclap" onset, patient age 42 or older with new or progressive headache, patient of any age with first headache or change in headache pattern, pregnant or postpartum status, history of  HIV or other immunocompromise, history of cancer, headache occurring with exertion, associated neck or shoulder pain, associated traumatic injury, concurrent use of anticoagulation, family history of spontaneous SAH, and concurrent drug use.    Other benign, more common causes of headache were considered including migraine, tension-type headache, cluster headache, referred pain from other cause such as sinus infection, dental pain, trigeminal neuralgia.   On exam, patient has a reassuring neuro exam including baseline mental status, no significant neck pain or meningeal signs, no signs of severe infection or fever.   For this patient's complaint of chest pain, the following emergent conditions were considered on the differential diagnosis: acute coronary syndrome, pulmonary embolism, pneumothorax, myocarditis, pericardial tamponade, aortic dissection, thoracic aortic aneurysm complication, esophageal perforation.   Other causes were also considered including: gastroesophageal reflux disease, musculoskeletal pain including costochondritis, pneumonia/pleurisy, herpes zoster, pericarditis.  In regards to possibility of ACS, patient has atypical features of pain, non-ischemic and unchanged EKG and negative troponin(s). He has had recent coronary CT with calcium score of 0.    In regards to possibility of PE, symptoms are atypical for PE and risk profile is low, making PE low likelihood.  He has a history of DVT, provoked after an injury and surgery.  No current risk factors and feel that this is low risk without leg pain or swelling, unstable vitals.  For this patient's complaint of abdominal pain, the following conditions were considered on the differential diagnosis: gastritis/PUD, enteritis/duodenitis, appendicitis, cholelithiasis/cholecystitis, cholangitis, pancreatitis, ruptured viscus, colitis, diverticulitis, small/large bowel obstruction, proctitis, cystitis, pyelonephritis, ureteral colic, aortic  dissection, aortic aneurysm. Atypical chest etiologies were also considered including ACS, PE, and pneumonia.  The patient's vital signs, pertinent lab work and imaging were reviewed and interpreted as discussed in the ED course. Hospitalization was considered for further testing, treatments, or serial exams/observation. However as patient is well-appearing, has a stable exam over the course of their evaluation, and reassuring studies today, I do not feel that they warrant admission at this time. This plan was discussed with the patient who verbalizes agreement and comfort with this  plan and seems reliable and able to return to the Emergency Department with worsening or changing symptoms.          Final Clinical Impression(s) / ED Diagnoses Final diagnoses:  Acute nonintractable headache, unspecified headache type  Upper abdominal pain  Precordial pain    Rx / DC Orders ED Discharge Orders          Ordered    ondansetron (ZOFRAN-ODT) 4 MG disintegrating tablet  Every 8 hours PRN        07/25/23 1852              Renne Crigler, PA-C 07/27/23 1610    Arby Barrette, MD 08/06/23 1952

## 2023-07-25 NOTE — Discharge Instructions (Signed)
Please read and follow all provided instructions.  Your diagnoses today include:  1. Acute nonintractable headache, unspecified headache type   2. Upper abdominal pain   3. Precordial pain     Tests performed today include: Blood cell counts and platelets: Were normal Kidney and liver function tests: Creatinine appears stable at 1.5, normal liver function test Pancreas function test (called lipase): Was normal Urine test to look for infection: Was normal Troponin: Cardiac enzyme was normal Vital signs. See below for your results today.   Medications prescribed:  Zofran (ondansetron) - for nausea and vomiting  Take any prescribed medications only as directed.  Home care instructions:  Follow any educational materials contained in this packet.  Follow-up instructions: Please follow-up with your primary care provider in the next 3 days for further evaluation of your symptoms.    Return instructions:  SEEK IMMEDIATE MEDICAL ATTENTION IF: The pain does not go away or becomes severe  A temperature above 101F develops  Repeated vomiting occurs (multiple episodes)  The pain becomes localized to portions of the abdomen. The right side could possibly be appendicitis. In an adult, the left lower portion of the abdomen could be colitis or diverticulitis.  Blood is being passed in stools or vomit (bright red or black tarry stools)  You develop chest pain, difficulty breathing, dizziness or fainting, or become confused, poorly responsive, or inconsolable (young children) If you have any other emergent concerns regarding your health  Additional Information: Abdominal (belly) pain can be caused by many things. Your caregiver performed an examination and possibly ordered blood/urine tests and imaging (CT scan, x-rays, ultrasound). Many cases can be observed and treated at home after initial evaluation in the emergency department. Even though you are being discharged home, abdominal pain can be  unpredictable. Therefore, you need a repeated exam if your pain does not resolve, returns, or worsens. Most patients with abdominal pain don't have to be admitted to the hospital or have surgery, but serious problems like appendicitis and gallbladder attacks can start out as nonspecific pain. Many abdominal conditions cannot be diagnosed in one visit, so follow-up evaluations are very important.  Your vital signs today were: BP 132/84   Pulse 68   Temp 97.8 F (36.6 C)   Resp 17   Ht 6\' 3"  (1.905 m)   Wt (!) 152.9 kg   SpO2 99%   BMI 42.12 kg/m  If your blood pressure (bp) was elevated above 135/85 this visit, please have this repeated by your doctor within one month. --------------

## 2023-08-29 ENCOUNTER — Encounter: Payer: Self-pay | Admitting: Family Medicine

## 2023-08-29 ENCOUNTER — Ambulatory Visit: Payer: Managed Care, Other (non HMO) | Admitting: Family Medicine

## 2023-08-29 ENCOUNTER — Telehealth: Payer: Self-pay | Admitting: Family Medicine

## 2023-08-29 VITALS — BP 122/78 | HR 78 | Temp 98.2°F | Wt 336.0 lb

## 2023-08-29 DIAGNOSIS — M109 Gout, unspecified: Secondary | ICD-10-CM | POA: Diagnosis not present

## 2023-08-29 DIAGNOSIS — J019 Acute sinusitis, unspecified: Secondary | ICD-10-CM | POA: Diagnosis not present

## 2023-08-29 DIAGNOSIS — R6889 Other general symptoms and signs: Secondary | ICD-10-CM

## 2023-08-29 LAB — POC COVID19 BINAXNOW: SARS Coronavirus 2 Ag: NEGATIVE

## 2023-08-29 LAB — POCT INFLUENZA A/B
Influenza A, POC: NEGATIVE — AB
Influenza B, POC: NEGATIVE

## 2023-08-29 LAB — POCT RAPID STREP A (OFFICE): Rapid Strep A Screen: NEGATIVE

## 2023-08-29 MED ORDER — METHYLPREDNISOLONE 4 MG PO TBPK: ORAL_TABLET | ORAL | 0 refills | Status: DC

## 2023-08-29 MED ORDER — AZITHROMYCIN 250 MG PO TABS: ORAL_TABLET | ORAL | 0 refills | Status: DC

## 2023-08-29 NOTE — Telephone Encounter (Signed)
Pt is calling and would like work note from 11-19 and return back to work on 08-31-2023 . Pt saw dr fry today

## 2023-08-29 NOTE — Progress Notes (Signed)
   Subjective:    Patient ID: Francis Lin, male    DOB: [birth year], 47 y.o.   MRN: [id number]  HPI Here for 2 issues. First about 5 days ago he developed sinus pressure, PND, ST, and a dry cough. No fever. He is taking Mucinex and Aleve. Then 4 days ago he developed swelling and pain in the left wrist, and the pain radiates up the left arm. No recent trauma. He wonders if this may be gout. He had been taking Uloric and Colchicine  daily, but he stopped taking these 2 months ago.    Review of Systems  Constitutional: Negative.   HENT:  Positive for congestion, postnasal drip, sinus pressure and sore throat. Negative for ear pain.   Eyes: Negative.   Respiratory:  Positive for cough. Negative for shortness of breath and wheezing.   Musculoskeletal:  Positive for arthralgias.       Objective:   Physical Exam Constitutional:      Appearance: Normal appearance. He is not ill-appearing.  HENT:     Right Ear: Tympanic membrane, ear canal and external ear normal.     Left Ear: Tympanic membrane, ear canal and external ear normal.     Nose: Nose normal.     Mouth/Throat:     Pharynx: Oropharynx is clear.  Eyes:     Conjunctiva/sclera: Conjunctivae normal.  Pulmonary:     Effort: Pulmonary effort is normal.     Breath sounds: Normal breath sounds.  Musculoskeletal:     Comments: The left wrist is swollen, warm, and quite tender   Lymphadenopathy:     Cervical: No cervical adenopathy.  Neurological:     Mental Status: He is alert.           Assessment & Plan:  He has a sinusitis, and we will treat this with a Zpack. He also has an acute gout flare, and we will treat this with a Medrol dose pack. I advised him to get back on the Colchicine and Uloric every day, and he agreed. Follow up as needed.  Gershon Crane, MD

## 2023-08-30 NOTE — Telephone Encounter (Signed)
Pt asking that note dictate him returning to work on 08/30/23 as opposed to 08/31/23. Requests a call to let him know to come by to pick up letter when it is ready, does not want it loaded to mychart.

## 2023-08-30 NOTE — Telephone Encounter (Signed)
Pt provided the letter requested

## 2023-09-04 NOTE — Progress Notes (Unsigned)
Office Visit Note  Patient: Francis Lin             Date of Birth: 1976-09-09           MRN: [id number]             PCP: Kristian Covey, MD Referring: Kristian Covey, MD Visit Date: 09/14/2023 Occupation: @GUAROCC @  Subjective:  No chief complaint on file.   History of Present Illness: Francis Lin is a 47 y.o. male ***     Activities of Daily Living:  Patient reports morning stiffness for *** {minute/hour:19697}.   Patient {ACTIONS;DENIES/REPORTS:21021675::"Denies"} nocturnal pain.  Difficulty dressing/grooming: {ACTIONS;DENIES/REPORTS:21021675::"Denies"} Difficulty climbing stairs: {ACTIONS;DENIES/REPORTS:21021675::"Denies"} Difficulty getting out of chair: {ACTIONS;DENIES/REPORTS:21021675::"Denies"} Difficulty using hands for taps, buttons, cutlery, and/or writing: {ACTIONS;DENIES/REPORTS:21021675::"Denies"}  No Rheumatology ROS completed.   PMFS History:  Patient Active Problem List   Diagnosis Date Noted   OSA (obstructive sleep apnea) 06/20/2022   Episodic cluster headache, not intractable 06/20/2022   Sinus congestion 06/20/2022   Morbid obesity (HCC) 06/20/2022   Episode of change in speech    Aphasia 05/10/2022   LUQ abdominal pain 11/03/2020   Generalized abdominal pain 11/03/2020   Altered bowel habits 11/03/2020   Nausea without vomiting 11/03/2020   Migraine without aura and without status migrainosus, not intractable 11/22/2018   Nonsustained ventricular tachycardia (HCC) 08/03/2018   Elevated serum creatinine 06/18/2018   Slow heart rate 06/06/2018   Shortness of breath 04/04/2018   Venous insufficiency 01/05/2017   Deep vein thrombosis (DVT) of popliteal vein of right lower extremity (HCC) 06/30/2016   S/P ORIF (open reduction internal fixation) fracture 06/22/2016   Multiple fractures of both lower extremities 05/31/2016   Elevated CK 05/17/2016   Fracture of right fibula 05/17/2016   Fracture of right tibia 05/17/2016    Fracture of tibia, left, closed 05/17/2016   Trauma 05/17/2016   Situational anxiety 08/28/2014   Vitamin D deficiency 09/22/2013   Shifting sleep-work schedule, affecting sleep 06/20/2013   Snoring 06/20/2013   Hypersomnia, persistent    History of esophageal stricture 04/16/2013   Pain in joint, ankle and foot 04/16/2013   Gout 04/16/2013   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   OBESITY, CLASS II, BMI 35- 39.9 03/15/2012   Hypertriglyceridemia 03/15/2012   Atypical chest pain 12/02/2008   Dysphagia 12/02/2008    Past Medical History:  Diagnosis Date   Acid reflux    Allergy    Anxiety    stress    Clotting disorder (HCC)    right leg x 2 in calf and behind knee    DVT (deep venous thrombosis) (HCC)    Esophagitis    Gout    Hypersomnia, persistent    IBS (irritable bowel syndrome)    PONV (postoperative nausea and vomiting)    nausea with no vomiting    Renal disorder    Shifting sleep-work schedule, affecting sleep 06/20/2013   Snoring disorder 06/20/2013   Tubular adenoma of colon     Family History  Problem Relation Age of Onset   Lupus Mother    Heart attack Father    Arthritis Father    Hypertension Father    Cancer Father        multiple myeloma   Diabetes Father    Atrial fibrillation Father    Gout Father    Kidney failure Father    Edema Sister    Diabetes Sister    Bipolar disorder Sister    Stroke Sister  Obesity Sister    Adrenal disorder Sister    Lupus Maternal Aunt    Eczema Daughter    ADD / ADHD Daughter    Asthma Daughter    Eczema Daughter    ADD / ADHD Daughter    Hypotension Daughter    Eczema Daughter    High Cholesterol Daughter    Colon cancer Neg Hx    Colon polyps Neg Hx    Esophageal cancer Neg Hx    Rectal cancer Neg Hx    Stomach cancer Neg Hx    Past Surgical History:  Procedure Laterality Date   COLON SURGERY     COLONOSCOPY     CYST REMOVAL NECK  04/17/2023   ESOPHAGEAL DILATION     HEMORRHOID BANDING      ORIF TIBIA & FIBULA FRACTURES Right    right leg surgery x 2 places- rod in place knee to ankle    UPPER GASTROINTESTINAL ENDOSCOPY     wisdom teeth     wrist surgery Right    Social History   Social History Narrative   Right handed   Caffeine use: tea sometimes   Immunization History  Administered Date(s) Administered   Influenza,inj,Quad PF,6+ Mos 07/18/2022   PFIZER(Purple Top)SARS-COV-2 Vaccination 12/19/2019, 01/09/2020, 11/09/2020   Tdap 08/19/2011, 05/17/2016     Objective: Vital Signs: There were no vitals taken for this visit.   Physical Exam   Musculoskeletal Exam: ***  CDAI Exam: CDAI Score: -- Patient Global: --; Provider Global: -- Swollen: --; Tender: -- Joint Exam 09/14/2023   No joint exam has been documented for this visit   There is currently no information documented on the homunculus. Go to the Rheumatology activity and complete the homunculus joint exam.  Investigation: No additional findings.  Imaging: No results found.  Recent Labs: Lab Results  Component Value Date   WBC 8.0 07/25/2023   HGB 15.5 07/25/2023   PLT 230 07/25/2023   NA 135 07/25/2023   K 4.1 07/25/2023   CL 100 07/25/2023   CO2 24 07/25/2023   GLUCOSE 99 07/25/2023   BUN 16 07/25/2023   CREATININE 1.50 (H) 07/25/2023   BILITOT 1.0 07/25/2023   ALKPHOS 77 07/25/2023   AST 24 07/25/2023   ALT 28 07/25/2023   PROT 7.7 07/25/2023   ALBUMIN 4.1 07/25/2023   CALCIUM 8.9 07/25/2023   GFRAA 58 (L) 03/22/2020    Speciality Comments: Allopurinol-rash  Procedures:  No procedures performed Allergies: Peanuts [peanut oil], Lactose, Allopurinol, Caffeine, Gabapentin, Ibuprofen, Omeprazole, Penicillins, Seasonal ic [cholestatin], and Tape   Assessment / Plan:     Visit Diagnoses: No diagnosis found.  Orders: No orders of the defined types were placed in this encounter.  No orders of the defined types were placed in this encounter.   Face-to-face time spent with  patient was *** minutes. Greater than 50% of time was spent in counseling and coordination of care.  Follow-Up Instructions: No follow-ups on file.   Ellen Henri, CMA  Note - This record has been created using Animal nutritionist.  Chart creation errors have been sought, but may not always  have been located. Such creation errors do not reflect on  the standard of medical care.

## 2023-09-14 ENCOUNTER — Ambulatory Visit: Payer: Managed Care, Other (non HMO) | Admitting: Rheumatology

## 2023-09-14 DIAGNOSIS — M1A00X0 Idiopathic chronic gout, unspecified site, without tophus (tophi): Secondary | ICD-10-CM

## 2023-09-14 DIAGNOSIS — G4710 Hypersomnia, unspecified: Secondary | ICD-10-CM

## 2023-09-14 DIAGNOSIS — Z8719 Personal history of other diseases of the digestive system: Secondary | ICD-10-CM

## 2023-09-14 DIAGNOSIS — Z8619 Personal history of other infectious and parasitic diseases: Secondary | ICD-10-CM

## 2023-09-14 DIAGNOSIS — I872 Venous insufficiency (chronic) (peripheral): Secondary | ICD-10-CM

## 2023-09-14 DIAGNOSIS — M19042 Primary osteoarthritis, left hand: Secondary | ICD-10-CM

## 2023-09-14 DIAGNOSIS — E781 Pure hyperglyceridemia: Secondary | ICD-10-CM

## 2023-09-14 DIAGNOSIS — G43009 Migraine without aura, not intractable, without status migrainosus: Secondary | ICD-10-CM

## 2023-09-14 DIAGNOSIS — M79671 Pain in right foot: Secondary | ICD-10-CM

## 2023-09-14 DIAGNOSIS — R918 Other nonspecific abnormal finding of lung field: Secondary | ICD-10-CM

## 2023-09-14 DIAGNOSIS — F418 Other specified anxiety disorders: Secondary | ICD-10-CM

## 2023-09-14 DIAGNOSIS — R7989 Other specified abnormal findings of blood chemistry: Secondary | ICD-10-CM

## 2023-09-14 DIAGNOSIS — Z86718 Personal history of other venous thrombosis and embolism: Secondary | ICD-10-CM

## 2023-09-14 DIAGNOSIS — I4729 Other ventricular tachycardia: Secondary | ICD-10-CM

## 2023-09-14 DIAGNOSIS — Z9889 Other specified postprocedural states: Secondary | ICD-10-CM

## 2023-09-14 DIAGNOSIS — Z8781 Personal history of (healed) traumatic fracture: Secondary | ICD-10-CM

## 2023-09-14 DIAGNOSIS — M170 Bilateral primary osteoarthritis of knee: Secondary | ICD-10-CM

## 2023-11-13 ENCOUNTER — Ambulatory Visit: Payer: Managed Care, Other (non HMO) | Admitting: Family Medicine

## 2023-11-13 ENCOUNTER — Ambulatory Visit: Payer: Managed Care, Other (non HMO)

## 2023-11-13 VITALS — BP 112/76 | HR 65 | Temp 98.1°F | Wt 340.5 lb

## 2023-11-13 DIAGNOSIS — M79644 Pain in right finger(s): Secondary | ICD-10-CM

## 2023-11-13 NOTE — Patient Instructions (Signed)
Set up physical at some point this year.  

## 2023-11-13 NOTE — Progress Notes (Signed)
Established Patient Office Visit  Subjective   Patient ID: Francis Lin, male    DOB: [birth year]  Age: 48 y.o. MRN: [id number]  Chief Complaint  Patient presents with   Hand Pain    HPI   Francis Lin is seen with right ring finger pain.  Started about 3 weeks ago.  He was at work and lifting a box that was stapled to a pallet up.  In the process of trying to pull away the box from the pallet he felt a popping sensation and looked down and noticed some transient deformity of the right ring finger PIP joint.  He wonders if this may have been dislocated.  He was able to move the finger around I am feels like things went back in place.  Since that time he said some mild swelling and soreness involving the PIP joint.  He feels like he has difficulty fully flexing the joint secondary to edema.  No other injuries reported.  Past Medical History:  Diagnosis Date   Acid reflux    Allergy    Anxiety    stress    Clotting disorder (HCC)    right leg x 2 in calf and behind knee    DVT (deep venous thrombosis) (HCC)    Esophagitis    Gout    Hypersomnia, persistent    IBS (irritable bowel syndrome)    PONV (postoperative nausea and vomiting)    nausea with no vomiting    Renal disorder    Shifting sleep-work schedule, affecting sleep 06/20/2013   Snoring disorder 06/20/2013   Tubular adenoma of colon    Past Surgical History:  Procedure Laterality Date   COLON SURGERY     COLONOSCOPY     CYST REMOVAL NECK  04/17/2023   ESOPHAGEAL DILATION     HEMORRHOID BANDING     ORIF TIBIA & FIBULA FRACTURES Right    right leg surgery x 2 places- rod in place knee to ankle    UPPER GASTROINTESTINAL ENDOSCOPY     wisdom teeth     wrist surgery Right     reports that he has never smoked. He has been exposed to tobacco smoke. He has never used smokeless tobacco. He reports that he does not drink alcohol and does not use drugs. family history includes ADD / ADHD in his daughter and daughter;  Adrenal disorder in his sister; Arthritis in his father; Asthma in his daughter; Atrial fibrillation in his father; Bipolar disorder in his sister; Cancer in his father; Diabetes in his father and sister; Eczema in his daughter, daughter, and daughter; Edema in his sister; Gout in his father; Heart attack in his father; High Cholesterol in his daughter; Hypertension in his father; Hypotension in his daughter; Kidney failure in his father; Lupus in his maternal aunt and mother; Obesity in his sister; Stroke in his sister. Allergies  Allergen Reactions   Peanuts [Peanut Oil] Anaphylaxis   Lactose Diarrhea and Other (See Comments)    Lactose intolerance Lactose intolerance Lactose intolerance   Allopurinol     rash   Caffeine Other (See Comments)    "makes feel bad", headaches   Gabapentin Other (See Comments)    Messes with memory   Ibuprofen Other (See Comments)    Early onset of kidney issues (Stage 3A)   Omeprazole Other (See Comments)    Mood swing   Penicillins Other (See Comments)    Headaches    Seasonal Ic [Cholestatin]  Sneezing,runny nose , itchy eyes   Tape     Adhesive tape- caused rash    Review of Systems  Neurological:  Negative for sensory change and focal weakness.      Objective:     BP 112/76 (BP Location: Left Arm, Patient Position: Sitting, Cuff Size: Large)   Pulse 65   Temp 98.1 F (36.7 C) (Oral)   Wt (!) 340 lb 8 oz (154.4 kg)   SpO2 98%   BMI 42.56 kg/m  BP Readings from Last 3 Encounters:  11/13/23 112/76  08/29/23 122/78  07/25/23 132/85   Wt Readings from Last 3 Encounters:  11/13/23 (!) 340 lb 8 oz (154.4 kg)  08/29/23 (!) 336 lb (152.4 kg)  07/25/23 (!) 337 lb (152.9 kg)      Physical Exam Vitals reviewed.  Constitutional:      General: He is not in acute distress.    Appearance: He is not ill-appearing.  Cardiovascular:     Rate and Rhythm: Normal rate and regular rhythm.  Musculoskeletal:     Comments: Right ring finger  reveals very mild swelling at the PIP joint.  No ecchymosis.  No erythema or warmth.  No bony tenderness.  He has good range of motion with flexion and extension at the PIP, DIP, and MCP joints.  Neurological:     Mental Status: He is alert.      No results found for any visits on 11/13/23.    The ASCVD Risk score (Arnett DK, et al., 2019) failed to calculate for the following reasons:   Cannot find a previous HDL lab   Cannot find a previous total cholesterol lab    Assessment & Plan:   Problem List Items Addressed This Visit   None Visit Diagnoses       Finger pain, right    -  Primary   Relevant Orders   DG Finger Ring Right     Recent injury right ring finger PIP joint.  May have mild synovitis currently related to recent injury.  Doubt acute bony injury.  Obtain x-rays given his ongoing pain.  If negative give this some time.  No follow-ups on file.    Francis Peat, MD

## 2023-11-15 ENCOUNTER — Telehealth: Payer: Self-pay | Admitting: Pulmonary Disease

## 2023-11-16 NOTE — Telephone Encounter (Signed)
 CTHR and CT WO have both been ordered.  Spoke to [place] with [facility]. She is aware that a message has been sent to Dr. Waylan Haggard for clarification.   Dr. Waylan Haggard, please verify which scan should be performed. Thanks

## 2023-11-16 NOTE — Telephone Encounter (Signed)
 Please do the CT chest without contrast and cancel the high resolution CT

## 2023-11-17 NOTE — Telephone Encounter (Signed)
 Francis Lin with [facility] is aware of below message and voiced her understanding. CTHR has been canceled.  Nothing further needed.

## 2023-11-17 NOTE — Addendum Note (Signed)
 Addended by: Katie Parks A on: 11/17/2023 09:15 AM   Modules accepted: Orders

## 2023-11-21 ENCOUNTER — Encounter: Payer: Self-pay | Admitting: Pulmonary Disease

## 2023-12-05 ENCOUNTER — Ambulatory Visit (INDEPENDENT_AMBULATORY_CARE_PROVIDER_SITE_OTHER): Payer: Managed Care, Other (non HMO) | Admitting: Family Medicine

## 2023-12-05 VITALS — BP 118/86 | HR 68 | Temp 98.5°F | Ht 74.9 in | Wt 334.8 lb

## 2023-12-05 DIAGNOSIS — Z1159 Encounter for screening for other viral diseases: Secondary | ICD-10-CM

## 2023-12-05 DIAGNOSIS — R109 Unspecified abdominal pain: Secondary | ICD-10-CM | POA: Diagnosis not present

## 2023-12-05 DIAGNOSIS — Z0000 Encounter for general adult medical examination without abnormal findings: Secondary | ICD-10-CM | POA: Diagnosis not present

## 2023-12-05 DIAGNOSIS — E538 Deficiency of other specified B group vitamins: Secondary | ICD-10-CM | POA: Diagnosis not present

## 2023-12-05 LAB — HEPATIC FUNCTION PANEL
ALT: 19 U/L (ref 0–53)
AST: 17 U/L (ref 0–37)
Albumin: 4 g/dL (ref 3.5–5.2)
Alkaline Phosphatase: 74 U/L (ref 39–117)
Bilirubin, Direct: 0.1 mg/dL (ref 0.0–0.3)
Total Bilirubin: 0.4 mg/dL (ref 0.2–1.2)
Total Protein: 7.1 g/dL (ref 6.0–8.3)

## 2023-12-05 LAB — POCT URINALYSIS DIPSTICK
Bilirubin, UA: NEGATIVE
Blood, UA: NEGATIVE
Clarity, UA: NEGATIVE
Color, UA: NEGATIVE
Glucose, UA: NEGATIVE
Ketones, UA: NEGATIVE
Leukocytes, UA: NEGATIVE
Nitrite, UA: NEGATIVE
Protein, UA: NEGATIVE
Spec Grav, UA: 1.02 (ref 1.010–1.025)
Urobilinogen, UA: 0.2 U/dL
pH, UA: 6 (ref 5.0–8.0)

## 2023-12-05 LAB — BASIC METABOLIC PANEL
BUN: 14 mg/dL (ref 6–23)
CO2: 30 meq/L (ref 19–32)
Calcium: 8.9 mg/dL (ref 8.4–10.5)
Chloride: 103 meq/L (ref 96–112)
Creatinine, Ser: 1.37 mg/dL (ref 0.40–1.50)
GFR: 61.46 mL/min (ref >60.00)
Glucose, Bld: 91 mg/dL (ref 70–99)
Potassium: 4.7 meq/L (ref 3.5–5.1)
Sodium: 139 meq/L (ref 135–145)

## 2023-12-05 LAB — CBC WITH DIFFERENTIAL/PLATELET
Basophils Absolute: 0 10*3/uL (ref 0.0–0.1)
Basophils Relative: 0.6 % (ref 0.0–3.0)
Eosinophils Absolute: 0.2 10*3/uL (ref 0.0–0.7)
Eosinophils Relative: 3.6 % (ref 0.0–5.0)
HCT: 46.9 % (ref 39.0–52.0)
Hemoglobin: 15.4 g/dL (ref 13.0–17.0)
Lymphocytes Relative: 26.8 % (ref 12.0–46.0)
Lymphs Abs: 1.6 10*3/uL (ref 0.7–4.0)
MCHC: 32.8 g/dL (ref 30.0–36.0)
MCV: 91 fL (ref 78.0–100.0)
Monocytes Absolute: 0.7 10*3/uL (ref 0.1–1.0)
Monocytes Relative: 11.7 % (ref 3.0–12.0)
Neutro Abs: 3.3 10*3/uL (ref 1.4–7.7)
Neutrophils Relative %: 57.3 % (ref 43.0–77.0)
Platelets: 258 10*3/uL (ref 150.0–400.0)
RBC: 5.15 Mil/uL (ref 4.22–5.81)
RDW: 14.7 % (ref 11.5–15.5)
WBC: 5.8 10*3/uL (ref 4.0–10.5)

## 2023-12-05 LAB — LIPID PANEL
Cholesterol: 145 mg/dL (ref 0–200)
HDL: 40.2 mg/dL (ref >39.00)
LDL Cholesterol: 86 mg/dL (ref 0–99)
NonHDL: 104.58
Total CHOL/HDL Ratio: 4
Triglycerides: 92 mg/dL (ref 0.0–149.0)
VLDL: 18.4 mg/dL (ref 0.0–40.0)

## 2023-12-05 LAB — PSA: PSA: 0.45 ng/mL (ref 0.10–4.00)

## 2023-12-05 LAB — VITAMIN B12: Vitamin B-12: 293 pg/mL (ref 211–911)

## 2023-12-05 LAB — TSH: TSH: 2.06 u[IU]/mL (ref 0.35–5.50)

## 2023-12-05 NOTE — Patient Instructions (Signed)
 Consider calorie tracker such as Myfitnesspal.

## 2023-12-05 NOTE — Addendum Note (Signed)
 Addended by: Vickii Chafe on: 12/05/2023 05:14 PM   Modules accepted: Orders

## 2023-12-05 NOTE — Progress Notes (Signed)
 Established Patient Office Visit  Subjective   Patient ID: Francis Lin, male    DOB: Nov 03, 1975  Age: 48 y.o. MRN: [id number]  Chief Complaint  Patient presents with   Annual Exam    Pt reports he is fasting for blood work.  Pt would like to discuss about vitamin and back pain since had covid 11/16/2023.     HPI   Francis Lin is seen today for physical exam-and for separate problem as below.  He is past medical history significant for obesity, history of migraine headaches, past history of DVT, obstructive sleep apnea, gout.  He has had recent history of left flank pain for about a week with some radiation anteriorly.  No history of kidney stones.  Denies any dysuria.  No fevers or chills.  Pain is somewhat intermittent.  No change in bowel habits.  Appetite and weight stable.  Last colonoscopy was 4/22 with recommended 3-year follow-up.  Health maintenance reviewed:  Health Maintenance  Topic Date Due   Hepatitis C Screening  Never done   Colonoscopy  01/12/2024   COVID-19 Vaccine (4 - 2024-25 season) 12/21/2023 (Originally 06/11/2023)   INFLUENZA VACCINE  01/08/2024 (Originally 05/11/2023)   DTaP/Tdap/Td (8 - Td or Tdap) 05/17/2026   HIV Screening  Completed   HPV VACCINES  Aged Out   Social history-married.  He has 3 children from first marriage and 4 stepchildren.  Works at [employer]  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  Non-smoker.  No alcohol.  Family history-father had multiple medical problems including history of multi myeloma, atrial fibrillation, hypertension, type 2 diabetes, history of CVA, gout and died ultimately age 36 complications of chronic kidney disease.  His mother died in her 34s of presumably lupus complications.  He has 1 brother and 1 sister.  Brother recently healthy.  Sister has history of obesity and type 2 diabetes.  He had a brother that died of AIDS complications.  Past Medical History:  Diagnosis Date   Acid reflux    Allergy     Anxiety    stress    Clotting disorder (HCC)    right leg x 2 in calf and behind knee    DVT (deep venous thrombosis) (HCC)    Esophagitis    Gout    Hypersomnia, persistent    IBS (irritable bowel syndrome)    PONV (postoperative nausea and vomiting)    nausea with no vomiting    Renal disorder    Shifting sleep-work schedule, affecting sleep 06/20/2013   Snoring disorder 06/20/2013   Tubular adenoma of colon    Past Surgical History:  Procedure Laterality Date   COLON SURGERY     COLONOSCOPY     CYST REMOVAL NECK  04/17/2023   ESOPHAGEAL DILATION     HEMORRHOID BANDING     ORIF TIBIA & FIBULA FRACTURES Right    right leg surgery x 2 places- rod in place knee to ankle    UPPER GASTROINTESTINAL ENDOSCOPY     wisdom teeth     wrist surgery Right     reports that he has never smoked. He has been exposed to tobacco smoke. He has never used smokeless tobacco. He reports that he does not drink alcohol and does not use drugs. family history includes ADD / ADHD in his daughter and daughter; Adrenal disorder in his sister; Arthritis in his father; Asthma in his daughter; Atrial fibrillation in his father; Bipolar disorder in his sister; Cancer in his father; Diabetes  in his father and sister; Eczema in his daughter, daughter, and daughter; Edema in his sister; Gout in his father; Heart attack in his father; High Cholesterol in his daughter; Hypertension in his father; Hypotension in his daughter; Kidney failure in his father; Lupus in his maternal aunt and mother; Obesity in his sister; Stroke in his sister. Allergies  Allergen Reactions   Peanuts [Peanut Oil] Anaphylaxis   Lactose Diarrhea and Other (See Comments)    Lactose intolerance Lactose intolerance Lactose intolerance   Allopurinol     rash   Caffeine Other (See Comments)    "makes feel bad", headaches   Gabapentin Other (See Comments)    Messes with memory   Ibuprofen Other (See Comments)    Early onset of kidney issues  (Stage 3A)   Omeprazole Other (See Comments)    Mood swing   Penicillins Other (See Comments)    Headaches    Seasonal Ic [Cholestatin]     Sneezing,runny nose , itchy eyes   Tape     Adhesive tape- caused rash    Review of Systems  Constitutional:  Negative for chills, fever, malaise/fatigue and weight loss.  HENT:  Negative for hearing loss.   Eyes:  Negative for blurred vision and double vision.  Respiratory:  Negative for cough and shortness of breath.   Cardiovascular:  Negative for chest pain, palpitations and leg swelling.  Gastrointestinal:  Negative for abdominal pain, blood in stool, constipation and diarrhea.  Genitourinary:  Positive for flank pain. Negative for dysuria.  Skin:  Negative for rash.  Neurological:  Negative for dizziness, speech change, seizures, loss of consciousness and headaches.  Psychiatric/Behavioral:  Negative for depression.       Objective:     BP 118/86 (BP Location: Left Arm, Patient Position: Sitting, Cuff Size: Large)   Pulse 68   Temp 98.5 F (36.9 C) (Oral)   Ht 6' 2.9" (1.902 m)   Wt (!) 334 lb 12.8 oz (151.9 kg)   SpO2 98%   BMI 41.96 kg/m  BP Readings from Last 3 Encounters:  12/05/23 118/86  11/13/23 112/76  08/29/23 122/78   Wt Readings from Last 3 Encounters:  12/05/23 (!) 334 lb 12.8 oz (151.9 kg)  11/13/23 (!) 340 lb 8 oz (154.4 kg)  08/29/23 (!) 336 lb (152.4 kg)      Physical Exam Vitals reviewed.  Constitutional:      General: He is not in acute distress.    Appearance: He is well-developed.  HENT:     Head: Normocephalic and atraumatic.     Ears:     Comments: Does have moderate amount of cerumen in both ear canals Eyes:     Pupils: Pupils are equal, round, and reactive to light.  Neck:     Thyroid: No thyromegaly.  Cardiovascular:     Rate and Rhythm: Normal rate and regular rhythm.     Heart sounds: Normal heart sounds. No murmur heard. Pulmonary:     Effort: No respiratory distress.     Breath  sounds: No wheezing or rales.  Abdominal:     General: Bowel sounds are normal. There is no distension.     Palpations: Abdomen is soft. There is no mass.     Tenderness: There is no abdominal tenderness. There is no guarding or rebound.  Musculoskeletal:     Cervical back: Normal range of motion and neck supple.     Right lower leg: No edema.     Left lower  leg: No edema.  Lymphadenopathy:     Cervical: No cervical adenopathy.  Skin:    Findings: No rash.  Neurological:     Mental Status: He is alert and oriented to person, place, and time.     Cranial Nerves: No cranial nerve deficit.      No results found for any visits on 12/05/23.  Last CBC Lab Results  Component Value Date   WBC 8.0 07/25/2023   HGB 15.5 07/25/2023   HCT 46.4 07/25/2023   MCV 89.1 07/25/2023   MCH 29.8 07/25/2023   RDW 14.1 07/25/2023   PLT 230 07/25/2023   Last metabolic panel Lab Results  Component Value Date   GLUCOSE 99 07/25/2023   NA 135 07/25/2023   K 4.1 07/25/2023   CL 100 07/25/2023   CO2 24 07/25/2023   BUN 16 07/25/2023   CREATININE 1.50 (H) 07/25/2023   GFRNONAA 57 (L) 07/25/2023   CALCIUM 8.9 07/25/2023   PROT 7.7 07/25/2023   ALBUMIN 4.1 07/25/2023   BILITOT 1.0 07/25/2023   ALKPHOS 77 07/25/2023   AST 24 07/25/2023   ALT 28 07/25/2023   ANIONGAP 11 07/25/2023   Last lipids Lab Results  Component Value Date   CHOL 128 02/17/2020   HDL 33.30 (L) 02/17/2020   LDLCALC 80 02/17/2020   TRIG 76.0 02/17/2020   CHOLHDL 4 02/17/2020   Last hemoglobin A1c Lab Results  Component Value Date   HGBA1C 5.5 06/14/2023   Last thyroid functions Lab Results  Component Value Date   TSH 2.57 05/11/2023   Last vitamin B12 and Folate Lab Results  Component Value Date   VITAMINB12 578 10/07/2022   FOLATE 11.2 05/10/2022      The ASCVD Risk score (Arnett DK, et al., 2019) failed to calculate for the following reasons:   Cannot find a previous HDL lab   Cannot find a  previous total cholesterol lab    Assessment & Plan:   #1 physical exam.  Chronic medical problems as above.  We discussed several health maintenance issues as follows  -We discussed healthy weight loss options.  We suggested possible calorie tracking app such as my fitness pal.  Try to establish more consistent exercise.  We have challenged him to try to lose some weight -He declines influenza vaccine. - due for repeat colonoscopy April.  He will contact GI if is not heard from them in the next few weeks -Tetanus up-to-date for 2 more years -We discussed hepatitis C screening though he is low risk  #2 left flank pain.  Urine dipstick today is completely clear.  No hematuria.  Probably musculoskeletal.  Observe for now will be in touch if not resolving next week or 2  #3 history of B12 deficiency.  Patient has not been taking supplement regularly in the past several weeks.  Recheck B12 level   No follow-ups on file.    Evelena Peat, MD

## 2023-12-06 LAB — HEPATITIS C ANTIBODY: Hepatitis C Ab: NONREACTIVE

## 2023-12-07 ENCOUNTER — Telehealth: Payer: Self-pay | Admitting: Pulmonary Disease

## 2023-12-07 NOTE — Telephone Encounter (Signed)
 Will forward to [facility]

## 2023-12-07 NOTE — Telephone Encounter (Signed)
 Francis Lin checking on the authorization for CT scan. [name] phone number is [phone].

## 2023-12-08 NOTE — Telephone Encounter (Signed)
 [name] is calling back. Berkley Harvey is still pending so they will have to cancel the CT

## 2023-12-11 ENCOUNTER — Other Ambulatory Visit: Payer: Managed Care, Other (non HMO)

## 2023-12-11 NOTE — Telephone Encounter (Signed)
 Pt's CT has been authorized by [facility] 12/11/23 and rescheduled for the CT at [facility]. Pt is aware of appt update.

## 2024-01-02 ENCOUNTER — Telehealth: Payer: Self-pay

## 2024-01-02 ENCOUNTER — Ambulatory Visit
Admission: RE | Admit: 2024-01-02 | Discharge: 2024-01-02 | Disposition: A | Discharge status: Home / Self Care | Source: Ambulatory Visit | Attending: Pulmonary Disease | Admitting: Pulmonary Disease

## 2024-01-02 DIAGNOSIS — R911 Solitary pulmonary nodule: Secondary | ICD-10-CM

## 2024-01-02 NOTE — Telephone Encounter (Signed)
 Copied from CRM [id number]. Topic: General - Other >> Jan 02, 2024 11:17 AM Francis Lin wrote: Reason for CRM: patient called stating he receive a message through [facility] stating his creatinine was miscalculated. Patient stated that you leave a message in [place] regarding this

## 2024-01-03 NOTE — Telephone Encounter (Signed)
 Patient informed of the message below.

## 2024-01-15 ENCOUNTER — Encounter: Payer: Self-pay | Admitting: Pulmonary Disease

## 2024-01-15 ENCOUNTER — Other Ambulatory Visit: Payer: Self-pay | Admitting: Pulmonary Disease

## 2024-01-15 DIAGNOSIS — R911 Solitary pulmonary nodule: Secondary | ICD-10-CM

## 2024-01-20 ENCOUNTER — Emergency Department (HOSPITAL_BASED_OUTPATIENT_CLINIC_OR_DEPARTMENT_OTHER)
Admission: EM | Admit: 2024-01-20 | Discharge: 2024-01-20 | Disposition: A | Discharge status: Home / Self Care | Attending: Emergency Medicine | Admitting: Emergency Medicine

## 2024-01-20 ENCOUNTER — Encounter (HOSPITAL_BASED_OUTPATIENT_CLINIC_OR_DEPARTMENT_OTHER): Payer: Self-pay | Admitting: Emergency Medicine

## 2024-01-20 ENCOUNTER — Emergency Department (HOSPITAL_BASED_OUTPATIENT_CLINIC_OR_DEPARTMENT_OTHER)

## 2024-01-20 ENCOUNTER — Other Ambulatory Visit: Payer: Self-pay

## 2024-01-20 DIAGNOSIS — K219 Gastro-esophageal reflux disease without esophagitis: Secondary | ICD-10-CM | POA: Insufficient documentation

## 2024-01-20 DIAGNOSIS — Z91010 Allergy to peanuts: Secondary | ICD-10-CM | POA: Insufficient documentation

## 2024-01-20 DIAGNOSIS — N189 Chronic kidney disease, unspecified: Secondary | ICD-10-CM | POA: Insufficient documentation

## 2024-01-20 DIAGNOSIS — R1013 Epigastric pain: Secondary | ICD-10-CM | POA: Insufficient documentation

## 2024-01-20 DIAGNOSIS — R112 Nausea with vomiting, unspecified: Secondary | ICD-10-CM | POA: Insufficient documentation

## 2024-01-20 DIAGNOSIS — R1011 Right upper quadrant pain: Secondary | ICD-10-CM | POA: Insufficient documentation

## 2024-01-20 LAB — CBC
HCT: 43.4 % (ref 39.0–52.0)
Hemoglobin: 14.6 g/dL (ref 13.0–17.0)
MCH: 30.1 pg (ref 26.0–34.0)
MCHC: 33.6 g/dL (ref 30.0–36.0)
MCV: 89.5 fL (ref 80.0–100.0)
Platelets: 197 10*3/uL (ref 150–400)
RBC: 4.85 MIL/uL (ref 4.22–5.81)
RDW: 14.2 % (ref 11.5–15.5)
WBC: 9.9 10*3/uL (ref 4.0–10.5)
nRBC: 0 % (ref 0.0–0.2)

## 2024-01-20 LAB — COMPREHENSIVE METABOLIC PANEL WITH GFR
ALT: 44 U/L (ref 0–44)
AST: 58 U/L — ABNORMAL HIGH (ref 15–41)
Albumin: 3.8 g/dL (ref 3.5–5.0)
Alkaline Phosphatase: 71 U/L (ref 38–126)
Anion gap: 9 (ref 5–15)
BUN: 14 mg/dL (ref 6–20)
CO2: 23 mmol/L (ref 22–32)
Calcium: 8.4 mg/dL — ABNORMAL LOW (ref 8.9–10.3)
Chloride: 105 mmol/L (ref 98–111)
Creatinine, Ser: 1.42 mg/dL — ABNORMAL HIGH (ref 0.61–1.24)
GFR, Estimated: >60 mL/min (ref >60)
Glucose, Bld: 108 mg/dL — ABNORMAL HIGH (ref 70–99)
Potassium: 3.7 mmol/L (ref 3.5–5.1)
Sodium: 137 mmol/L (ref 135–145)
Total Bilirubin: 0.7 mg/dL (ref 0.0–1.2)
Total Protein: 7.3 g/dL (ref 6.5–8.1)

## 2024-01-20 LAB — URINALYSIS, ROUTINE W REFLEX MICROSCOPIC
Bilirubin Urine: NEGATIVE
Glucose, UA: NEGATIVE mg/dL
Hgb urine dipstick: NEGATIVE
Ketones, ur: NEGATIVE mg/dL
Leukocytes,Ua: NEGATIVE
Nitrite: NEGATIVE
Protein, ur: NEGATIVE mg/dL
Specific Gravity, Urine: 1.025 (ref 1.005–1.030)
pH: 6 (ref 5.0–8.0)

## 2024-01-20 MED ORDER — ALUM & MAG HYDROXIDE-SIMETH 200-200-20 MG/5ML PO SUSP
30.0000 mL | Freq: Once | ORAL | Status: AC
  Administered 2024-01-20: 30 mL via ORAL
  Filled 2024-01-20: qty 30

## 2024-01-20 MED ORDER — LIDOCAINE VISCOUS HCL 2 % MT SOLN
15.0000 mL | Freq: Once | OROMUCOSAL | Status: AC
  Administered 2024-01-20: 15 mL via ORAL
  Filled 2024-01-20: qty 15

## 2024-01-20 MED ORDER — PANTOPRAZOLE SODIUM 40 MG IV SOLR
40.0000 mg | Freq: Once | INTRAVENOUS | Status: AC
  Administered 2024-01-20: 40 mg via INTRAVENOUS
  Filled 2024-01-20: qty 10

## 2024-01-20 MED ORDER — IOHEXOL 300 MG/ML  SOLN
100.0000 mL | Freq: Once | INTRAMUSCULAR | Status: AC | PRN
  Administered 2024-01-20: 100 mL via INTRAVENOUS

## 2024-01-20 MED ORDER — ALUM & MAG HYDROXIDE-SIMETH 400-400-40 MG/5ML PO SUSP: 30.0000 mL | Freq: Four times a day (QID) | ORAL | 0 refills | Status: AC | PRN

## 2024-01-20 NOTE — ED Notes (Signed)
 Questions and concerns addressed. Discharge teaching completed.   Prescriptions reviewed and pharmacy verified.   Pt ambulatory upon discharge.

## 2024-01-20 NOTE — ED Provider Notes (Signed)
 [place] [facility] AT [facility] Provider Note   CSN: [id number] Arrival date & time: 01/20/24  0044     History  Chief Complaint  Patient presents with   Flank Pain    Francis Lin is a 48 y.o. male.  48 yo M here with epigastric/RUQ pain tonight associated with gas, nausea and vomiting after eating 6 pieces of pizza.  H/o GERD. H/o thyroid issues, ckd. No etoh, no drugs, no tobacco. No h/o same. States he might have had a problem with his pancreas awhile ago but doesn't remember a whole lot about it. No fevers. No trauma. Feelilng quite a bit better now. No diarrhea.    Flank Pain       Home Medications Prior to Admission medications   Medication Sig Start Date End Date Taking? Authorizing Provider  acetaminophen (TYLENOL) 325 MG tablet Take 650 mg by mouth every 6 (six) hours as needed for mild pain.    [provider]  colchicine 0.6 MG tablet Take 0.6 mg 1 tablet by mouth daily. 10/07/22   Romayne Clubs, PA-C  cyanocobalamin 1000 MCG tablet Take 1 tablet (1,000 mcg total) by mouth daily. 05/13/22   Abbe Abate, MD  febuxostat (ULORIC) 40 MG tablet Take 1 tablet (40 mg total) by mouth daily. 10/07/22   Romayne Clubs, PA-C  fluocinolone (VANOS) 0.01 % cream Apply 1 Application topically 2 (two) times daily as needed (Itching).    [provider]  fluticasone (FLONASE) 50 MCG/ACT nasal spray Place 1 spray into both nostrils daily as needed for allergies or rhinitis.    [provider]  loratadine (CLARITIN) 10 MG tablet Take 10 mg by mouth daily as needed for allergies.    [provider]  methocarbamol (ROBAXIN) 500 MG tablet Take 1 tablet (500 mg total) by mouth 4 (four) times daily. Patient not taking: Reported on 12/05/2023 03/29/23   Wilhelmenia Harada, MD  topiramate (TOPAMAX) 25 MG tablet Take 1 tablet (25 mg total) by mouth at bedtime for 7 days, THEN 2 tablets (50 mg total) at bedtime for 21 days.  12/12/22 03/09/23  Johny Nap, NP  cimetidine (TAGAMET) 200 MG tablet Take by mouth. 01/30/19 01/16/20  [provider]      Allergies    Peanuts [peanut oil], Lactose, Allopurinol, Caffeine, Gabapentin, Ibuprofen, Omeprazole, Penicillins, Seasonal ic [cholestatin], and Tape    Review of Systems   Review of Systems  Genitourinary:  Positive for flank pain.    Physical Exam Updated Vital Signs BP 133/73   Pulse 83   Temp 98.1 F (36.7 C) (Oral)   Resp 20   Ht 6\' 3"  (1.905 m)   Wt (!) 149.7 kg   SpO2 100%   BMI 41.25 kg/m  Physical Exam Vitals and nursing note reviewed.  Constitutional:      Appearance: He is well-developed.  HENT:     Head: Normocephalic and atraumatic.  Cardiovascular:     Rate and Rhythm: Normal rate.  Pulmonary:     Effort: Pulmonary effort is normal. No respiratory distress.  Abdominal:     General: There is no distension.     Tenderness: There is abdominal tenderness (mild RUQ w/o rebound/guarding.).  Musculoskeletal:        General: Normal range of motion.     Cervical back: Normal range of motion.  Neurological:     General: No focal deficit present.     Mental Status: He is alert.  ED Results / Procedures / Treatments   Labs (all labs ordered are listed, but only abnormal results are displayed) Labs Reviewed  COMPREHENSIVE METABOLIC PANEL WITH GFR - Abnormal; Notable for the following components:      Result Value   Glucose, Bld 108 (*)    Creatinine, Ser 1.42 (*)    Calcium 8.4 (*)    AST 58 (*)    All other components within normal limits  CBC  URINALYSIS, ROUTINE W REFLEX MICROSCOPIC    EKG EKG Interpretation Date/Time:  Saturday January 20 2024 00:57:59 EDT Ventricular Rate:  82 PR Interval:  156 QRS Duration:  108 QT Interval:  366 QTC Calculation: 427 R Axis:   60  Text Interpretation: Normal sinus rhythm Normal ECG When compared with ECG of 25-Jul-2023 16:28, PREVIOUS ECG IS PRESENT Confirmed by Eve Hinders [phone]) on 01/20/2024 3:40:18 AM  Radiology No results found.  Procedures Procedures    Medications Ordered in ED Medications  alum & mag hydroxide-simeth (MAALOX/MYLANTA) 200-200-20 MG/5ML suspension 30 mL (30 mLs Oral Given 01/20/24 0351)    And  lidocaine (XYLOCAINE) 2 % viscous mouth solution 15 mL (15 mLs Oral Given 01/20/24 0351)  pantoprazole (PROTONIX) injection 40 mg (40 mg Intravenous Given 01/20/24 0351)  iohexol (OMNIPAQUE) 300 MG/ML solution 100 mL (100 mLs Intravenous Contrast Given 01/20/24 0454)    ED Course/ Medical Decision Making/ A&P                                 Medical Decision Making Amount and/or Complexity of Data Reviewed Labs: ordered. Radiology: ordered.  Risk OTC drugs. Prescription drug management.   Labs overall ok but does have slightly elevated AST. Could be gas vs PUD vs gastritis, will treat for same, pending CT to rule out BIliary pathology.   Ct without obvious abnormality on my interpretation, radiology read reviewed. Symptom free at this time, suspect indigestion/PUD/gastritis. Will modify diet, utilize Maalox PRN, pcp follow up if no timproving , here if worsening.    Final Clinical Impression(s) / ED Diagnoses Final diagnoses:  None    Rx / DC Orders ED Discharge Orders     None         Akiyah Eppolito, Reymundo Caulk, MD 01/20/24 [phone]

## 2024-01-20 NOTE — ED Triage Notes (Signed)
 Patient reports right side flank pain that started tonight. Patient also reports nausea and vomiting. Denies urinary symptoms.

## 2024-01-30 ENCOUNTER — Ambulatory Visit: Admitting: Physician Assistant

## 2024-01-31 ENCOUNTER — Telehealth: Admitting: Pulmonary Disease

## 2024-01-31 ENCOUNTER — Ambulatory Visit: Admitting: Pulmonary Disease

## 2024-01-31 ENCOUNTER — Telehealth: Payer: Self-pay | Admitting: *Deleted

## 2024-01-31 DIAGNOSIS — R911 Solitary pulmonary nodule: Secondary | ICD-10-CM

## 2024-01-31 DIAGNOSIS — Z8616 Personal history of COVID-19: Secondary | ICD-10-CM | POA: Diagnosis not present

## 2024-01-31 NOTE — Telephone Encounter (Signed)
 Just got this message.  Visit has already been completed earlier today.  Nothing further needed

## 2024-01-31 NOTE — Patient Instructions (Signed)
 VISIT SUMMARY:  Today, you came in for a follow-up visit regarding your lung nodules and abdominal pain. We discussed your recent COVID-19 infection and its treatment, as well as the results of your recent CT scans.  YOUR PLAN:  -COVID-19 INFECTION: You recently had a COVID-19 infection that was treated with Paxlovid. The symptoms were severe enough to require time off work, but you have since recovered.  -BENIGN LUNG NODULE: A new 7 mm lung nodule was found on your recent CT scan, which may be related to your recent COVID-19 infection. The nodule appears benign, meaning it is not cancerous, and does not raise concern at this time. We will monitor it with a follow-up CT scan in 6 months to ensure it remains stable or resolves.  -ABDOMINAL PAIN: You have been experiencing intermittent pain on the right side of your abdomen that radiates to your back. The pain was severe initially but has lessened. A recent CT scan of your abdomen did not show any concerning issues.  INSTRUCTIONS:  Please schedule a follow-up CT scan in 6 months to monitor the lung nodule. Additionally, plan for a virtual visit in 6 months to review the results of the CT scan.

## 2024-01-31 NOTE — Telephone Encounter (Signed)
 Copied from CRM [id number]. Topic: Appointments - Appointment Info/Confirmation >> Jan 30, 2024  3:48 PM Crist Dominion wrote: Patient would like to know if Dr. Waylan Haggard thinks his CT follow up appointment tomorrow 4/23 is necessary as he has another CT in September that he will need a follow up for as well.    Dr Waylan Haggard- pt is asking if needs to keep upcoming visit for review of CT Chest done 01/02/24- Impression:   IMPRESSION: 1. No acute intrathoracic abnormality. Specifically, no pneumonia, pulmonary edema, or pleural effusion. 2. Interval resolution of a few small lung nodules noted above. However, there has been interval development of a 7 mm nodule in the anterior left upper lobe (axial 45) with an unchanged 4 mm left apical nodule). Follow-up is recommended, as documented below.   Non-contrast chest CT at 3-6 months is recommended. If the nodules are stable at time of repeat CT, then future CT at 18-24 months (from today's scan) is considered optional for low-risk patients, but is recommended for high-risk patients. This recommendation follows the consensus statement: Guidelines for Management of Incidental Pulmonary Nodules Detected on CT Images: From the Fleischner Society 2017; Radiology 2017; 284:228-243.     Electronically Signed   By: Rance Burrows M.D.   On: 01/10/2024 21:14  He has another scan planned for end of Sept 2025. Please advise, thanks!

## 2024-01-31 NOTE — Progress Notes (Signed)
 Francis Lin    [id number]    [birth year]  Primary Care Physician:Burchette, Marijean Shouts, MD  Referring Physician: Marquetta Sit, MD [address] [place],  [place] [address]  Virtual Visit via Video Note  I connected with Francis Lin on 01/31/24 at  8:45 AM EDT by a video enabled telemedicine application and verified that I am speaking with the correct person using two identifiers.  Location: Patient: Work Provider: Pulmonary office   Chief complaint: Follow-up for abnormal CT, elevated angiotensin-converting enzyme.  Evaluation for sarcoidosis  HPI: 48 y.o. with history of lung nodules, migraine, chronic kidney disease, right leg crush injury Sent for evaluation of elevated angiotensin-converting enzyme with concern for sarcoidosis He follows with Dr. Alvira Josephs for chronic gouty arthropathy and is on colchicine  as needed.  He had a work-up which included elevated ACE level CT last year showed subcentimeter pulmonary nodules  Denies any dyspnea, cough.  No rash, vision or liver issues.  He had COVID infection in 2021 and 2022 but did not require hospitalization History of right leg crush injury in 2017 requiring multiple surgeries.  He had a DVT in the setting and was anticoagulated for about 4 months  Pets: No pets Occupation: Museum/gallery exhibitions officer Exposures: No mold, hot tub, Jacuzzi.  No feather pillows or comforter Smoking history: Never smoker Travel history: No significant travel history Relevant family history: No family history of lung disease  Interim history: Discussed the use of AI scribe software for clinical note transcription with the patient, who gave verbal consent to proceed.  History of Present Illness Francis Lin is a 48 year old male who presents for follow-up of lung nodules  He has been experiencing intermittent breathing issues, particularly when lying down, since approximately three weeks before his first scan in March,  following a COVID-19 infection treated with Paxlovid. During this time, he was unable to work due to feeling unwell. He initially sought medical attention due to an elevated angiotensin converting enzyme level, raising concerns about sarcoidosis, but subsequent CT scans have not shown evidence of sarcoidosis. Previous scans noted small lung nodules, which appeared benign. A recent scan revealed a new 7 mm lung nodule, while a previous nodule has resolved. He is a non-smoker and considered low risk for malignancy.  He reports experiencing abdominal pain about a week ago, located on the right side of his abdomen and radiating to his back, near the kidney area. The pain was described as 'really, really painful' but has since become less severe and occurs intermittently. A CT scan of the abdomen was performed and was normal  No new issues with breathing since his last visit.   Outpatient Encounter Medications as of 01/31/2024  Medication Sig   acetaminophen  (TYLENOL ) 325 MG tablet Take 650 mg by mouth every 6 (six) hours as needed for mild pain.   alum & mag hydroxide-simeth (MAALOX PLUS) 400-400-40 MG/5ML suspension Take 30 mLs by mouth every 6 (six) hours as needed for indigestion.   colchicine  0.6 MG tablet Take 0.6 mg 1 tablet by mouth daily.   cyanocobalamin  1000 MCG tablet Take 1 tablet (1,000 mcg total) by mouth daily.   febuxostat  (ULORIC ) 40 MG tablet Take 1 tablet (40 mg total) by mouth daily.   fluocinolone (VANOS) 0.01 % cream Apply 1 Application topically 2 (two) times daily as needed (Itching).   fluticasone (FLONASE) 50 MCG/ACT nasal spray Place 1 spray into both nostrils daily as needed  for allergies or rhinitis.   loratadine (CLARITIN) 10 MG tablet Take 10 mg by mouth daily as needed for allergies.   methocarbamol  (ROBAXIN ) 500 MG tablet Take 1 tablet (500 mg total) by mouth 4 (four) times daily. (Patient not taking: Reported on 12/05/2023)   topiramate  (TOPAMAX ) 25 MG tablet Take 1  tablet (25 mg total) by mouth at bedtime for 7 days, THEN 2 tablets (50 mg total) at bedtime for 21 days.   [DISCONTINUED] cimetidine (TAGAMET) 200 MG tablet Take by mouth.   No facility-administered encounter medications on file as of 01/31/2024.   Physical Exam: Tele  Data Reviewed: Imaging: CT chest 06/15/2021-interval decrease in 9 mm left lower lobe pulmonary nodule with groundglass attenuation.  Scattered bilateral pulmonary nodules measuring up to 6 mm.  No lymphadenopathy.  CT high-resolution 11/22/2021-stable lung nodules.  Previously seen left lower lobe groundglass nodule not visualized.  No interstitial lung disease on lymphadenopathy.  CT chest 12/27/2022-3 new pulmonary nodules measuring 4 mm, clear lungs, 2.5 cm right thyroid  nodule.  Cardiac CT 06/30/2023-visualized lungs are stable.  CT chest 01/02/2024-no acute intrathoracic abnormality.  Interval resolution of previously noted lung nodules.  New 7 mm nodule in the left upper lobe  CT abdomen pelvis 01/20/2024-no acute findings I had reviewed the images personally.  PFTs: 12/28/2018 FVC 3.87 [77%], FEV1 3.20 [90%], F/F 83, TLC 6.24 [79%], DLCO 34.37 [99%] Minimal restriction, bronchodilator response with air trapping  Labs: Hepatic panel 11/02/2021-within normal limits Angiotensin-converting enzyme 11/02/2021-126 CCP, rheumatoid factor negative  Assessment & Plan COVID-19 infection Recent COVID-19 infection treated with Paxlovid. Symptoms were severe enough to require time off work. Treatment was initiated at an urgent care facility. He opted for treatment after being given the option.  Benign lung nodule New 7 mm lung nodule identified on recent CT scan, potentially related to recent COVID-19 infection. Previous lung nodule resolved. Current nodule appears benign and is not concerning at this time. He is a non-smoker and at low risk for malignancy. Follow-up is planned to ensure stability or resolution. - Order  follow-up CT scan in 6 months to monitor lung nodule. - Schedule virtual visit in 6 months to review CT scan results.    Assessment for sarcoidosis Although he has elevated angiotensin-converting enzyme I suspect this is a nonspecific elevation.  His CT does not have any typical findings of sarcoidosis such as lymphadenopathy or peribronchovascular nodularity.  He does have a small groundglass nodules which has resolved on follow-up scan.  This is likely benign finding  PFTs show minimal restriction which is likely from body habitus without evidence of ILD on CT scan.  He does have a bronchodilator response and air trapping and suggestive of small airways disease but he is asymptomatic and does not need inhalers at present  Shortness of Breath Occasional shortness of breath, possibly related to overexertion or meals. -Encouraged weight loss and physical fitness to improve symptoms.  Follow-up in 6 months, coinciding with repeat CT scan.     I discussed the assessment and treatment plan with the patient. The patient was provided an opportunity to ask questions and all were answered. The patient agreed with the plan and demonstrated an understanding of the instructions.   The patient was advised to call back or seek an in-person evaluation if the symptoms worsen or if the condition fails to improve as anticipated.  Plan/Recommendations: Follow-up CT in 6 months  Phyllis Breeze MD [facility] 01/31/2024, 8:58 AM  CC: Glean Lamy  Dyana Glade, MD

## 2024-02-04 NOTE — Progress Notes (Unsigned)
 se     Francis Canard, PA-C [address] [place], [place]  [address] Phone: [phone]   Primary Care Physician: Marquetta Sit, MD  Primary Gastroenterologist:  Francis Canard, PA-C / Dr. Laurell Pond   Chief Complaint: Lower abdominal pain, discuss colonoscopy       HPI:   Francis Lin is a 48 y.o. male, estab. Pt. Of Dr. Bridgett Camps, presents to schedule 3 year repeat colonoscopy due to hx of colon polyps.  01/20/24 CT Abd / Pelvis w/ contrast: To evaluate RUQ pain with nausea and vomiting (after eating 6 pieces of pizza); showed NO acute abnormality.  Since then he has not had any more abdominal pain, nausea or vomiting.  Currently he denies nausea, vomiting, diarrhea, constipation, melena, hematochezia, or abdominal pain.  He has no family history of colon cancer.  He struggles with weight gain and obesity.  Has questions about resources to help with weight loss.  History of IBS, Hx Colon Polys, GERD with erosive esophagitis and distal esophageal strictures which have been dilated.  He is intolerant to PPIs.  Takes famotidine  20 Mg twice daily and antiacid as needed.  Hx DVT and CKD.  01/2022 last EGD by Dr. Bridgett Camps: Mild gastritis.  Normal esophagus.  No stricture, however he underwent empiric esophageal dilation with 54 FR. Maloney.  Biopsies negative for H. pylori.    01/2021 last colonoscopy by Dr. Bridgett Camps: 2 (4 mm to 6 mm) tubular adenoma polyps removed from transverse colon, one 10 mm tubular adenoma polyp removed from sigmoid colon.  Small internal hemorrhoids with scars from previous hemorrhoidal banding.  No evidence of Crohn's or IBD.  3 year repeat.  Current Outpatient Medications  Medication Sig Dispense Refill   acetaminophen  (TYLENOL ) 325 MG tablet Take 650 mg by mouth every 6 (six) hours as needed for mild pain.     alum & mag hydroxide-simeth (MAALOX PLUS) 400-400-40 MG/5ML suspension Take 30 mLs by mouth every 6 (six) hours as needed for indigestion. 355 mL 0    colchicine  0.6 MG tablet Take 0.6 mg 1 tablet by mouth daily. 90 tablet 0   cyanocobalamin  1000 MCG tablet Take 1 tablet (1,000 mcg total) by mouth daily.     febuxostat  (ULORIC ) 40 MG tablet Take 1 tablet (40 mg total) by mouth daily. 30 tablet 2   fluocinolone (VANOS) 0.01 % cream Apply 1 Application topically 2 (two) times daily as needed (Itching).     fluticasone (FLONASE) 50 MCG/ACT nasal spray Place 1 spray into both nostrils daily as needed for allergies or rhinitis.     loratadine (CLARITIN) 10 MG tablet Take 10 mg by mouth daily as needed for allergies.     Multiple Vitamin (MULTIVITAMIN) tablet Take 1 tablet by mouth daily.     Na Sulfate-K Sulfate-Mg Sulfate concentrate (SUPREP) 17.5-3.13-1.6 GM/177ML SOLN Take 1 kit (354 mLs total) by mouth once for 1 dose. 354 mL 0   No current facility-administered medications for this visit.    Allergies as of 02/05/2024 - Review Complete 02/05/2024  Allergen Reaction Noted   Peanuts [peanut oil] Anaphylaxis 04/15/2012   Lactose Diarrhea and Other (See Comments) 07/21/2016   Allopurinol   11/09/2021   Caffeine Other (See Comments)    Gabapentin Other (See Comments) 11/02/2021   Ibuprofen  Other (See Comments) 05/10/2022   Omeprazole Other (See Comments) 03/15/2012   Penicillins Other (See Comments)    Seasonal ic [cholestatin]  06/20/2013   Tape  03/09/2023    Past Medical  History:  Diagnosis Date   Acid reflux    Allergy    Anxiety    stress    CKD stage 3a, GFR 45-59 ml/min (HCC)    Clotting disorder (HCC)    right leg x 2 in calf and behind knee    DVT (deep venous thrombosis) (HCC)    Esophagitis    Gout    Hypersomnia, persistent    IBS (irritable bowel syndrome)    Lung nodules    PONV (postoperative nausea and vomiting)    nausea with no vomiting    Renal disorder    Shifting sleep-work schedule, affecting sleep 06/20/2013   Snoring disorder 06/20/2013   Thyroid  nodule    Tubular adenoma of colon     Past  Surgical History:  Procedure Laterality Date   COLON SURGERY     COLONOSCOPY     CYST REMOVAL NECK  04/17/2023   ESOPHAGEAL DILATION     HEMORRHOID BANDING     ORIF TIBIA & FIBULA FRACTURES Right    right leg surgery x 2 places- rod in place knee to ankle    UPPER GASTROINTESTINAL ENDOSCOPY     wisdom teeth     wrist surgery Right     Review of Systems:    All systems reviewed and negative except where noted in HPI.    Physical Exam:  BP 120/86   Pulse 73   Ht 6\' 3"  (1.905 m)   Wt (!) 341 lb (154.7 kg)   SpO2 98%   BMI 42.62 kg/m  No LMP for male patient.  General: Well-nourished, well-developed obese, in no acute distress.  Lungs: Clear to auscultation bilaterally. Non-labored. Heart: Regular rate and rhythm, no murmurs rubs or gallops.  Abdomen: Bowel sounds are normal; Abdomen is Soft and obese; No hepatosplenomegaly, masses or hernias;  Mild Abdominal tenderness just above the umbilicus; No lower abdominal tenderness.  No RUQ or LUQ tenderness.  No guarding or rebound tenderness. Neuro: Alert and oriented x 3.  Grossly intact.  Psych: Alert and cooperative, normal mood and affect.   Imaging Studies: CT ABDOMEN PELVIS W CONTRAST Result Date: 01/20/2024 CLINICAL DATA:  Right-sided flank pain with nausea and vomiting EXAM: CT ABDOMEN AND PELVIS WITH CONTRAST TECHNIQUE: Multidetector CT imaging of the abdomen and pelvis was performed using the standard protocol following bolus administration of intravenous contrast. RADIATION DOSE REDUCTION: This exam was performed according to the departmental dose-optimization program which includes automated exposure control, adjustment of the mA and/or kV according to patient size and/or use of iterative reconstruction technique. CONTRAST:  OMNIPAQUE  IOHEXOL  300 MG/ML  SOLN COMPARISON:  10/02/2022 FINDINGS: Lower chest:  No contributory findings. Hepatobiliary: No focal liver abnormality.No evidence of biliary obstruction or stone.  Pancreas: Unremarkable. Spleen: Unremarkable. Adrenals/Urinary Tract: Negative adrenals. No hydronephrosis or stone. Focal scarring of the interpolar right kidney. Unremarkable bladder. Stomach/Bowel: No obstruction. No appendicitis or other visible bowel inflammation. Vascular/Lymphatic: No acute vascular abnormality. Chronic/stable mesenteric panniculitis with ground-glass density in the expanded central small bowel mesentery. No increasing nodal size or superimposed mass. No mass or adenopathy. Reproductive:No pathologic findings. Other: No ascites or pneumoperitoneum. Musculoskeletal: No acute abnormalities. Degenerative spurring of lower lumbar facets and sacroiliac joints. IMPRESSION: No acute finding or specific cause for symptoms. No bowel obstruction or wall thickening. Electronically Signed   By: Ronnette Coke M.D.   On: 01/20/2024 05:23    Labs: CBC    Component Value Date/Time   WBC 9.9 01/20/2024 0057  RBC 4.85 01/20/2024 0057   HGB 14.6 01/20/2024 0057   HCT 43.4 01/20/2024 0057   PLT 197 01/20/2024 0057   MCV 89.5 01/20/2024 0057   MCV 95.5 05/05/2013 1614   MCH 30.1 01/20/2024 0057   MCHC 33.6 01/20/2024 0057   RDW 14.2 01/20/2024 0057   LYMPHSABS 1.6 12/05/2023 0955   MONOABS 0.7 12/05/2023 0955   EOSABS 0.2 12/05/2023 0955   BASOSABS 0.0 12/05/2023 0955    CMP     Component Value Date/Time   NA 137 01/20/2024 0057   NA 139 09/15/2022 0000   K 3.7 01/20/2024 0057   CL 105 01/20/2024 0057   CO2 23 01/20/2024 0057   GLUCOSE 108 (H) 01/20/2024 0057   BUN 14 01/20/2024 0057   BUN 16 09/15/2022 0000   CREATININE 1.42 (H) 01/20/2024 0057   CREATININE 1.30 (H) 08/02/2022 1454   CALCIUM 8.4 (L) 01/20/2024 0057   PROT 7.3 01/20/2024 0057   PROT 6.6 04/13/2018 0000   ALBUMIN 3.8 01/20/2024 0057   ALBUMIN 3.8 04/13/2018 0000   AST 58 (H) 01/20/2024 0057   ALT 44 01/20/2024 0057   ALKPHOS 71 01/20/2024 0057   BILITOT 0.7 01/20/2024 0057   BILITOT 0.4 04/13/2018  0000   GFRNONAA >60 01/20/2024 0057   GFRAA 58 (L) 03/22/2020 1719     Assessment and Plan:   Lorell Smeriglio is a 48 y.o. y/o male presents for:  Hx adenomatous Colon Polyps -Scheduling 3 year repeat Colonoscopy -I discussed risks of colonoscopy with patient to include risk of bleeding, colon perforation, and risk of sedation.  Patient expressed understanding and agrees to proceed with colonoscopy.   2.  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 -Per pt. Request, refer to [facility] Nutrition / Weight management program.  3.  Hx GERD and IBS; Currently asymptomatic. -Reasurrance regarding recent normal Abd / pelvic CT. -Continue current meds: Famotidine  20 Mg twice daily and antacid as needed. -He is intolerant to PPIs. -Avoid GERD trigger foods / drinks.  Francis Canard, PA-C  Follow up As Needed based on Colonoscopy results.

## 2024-02-05 ENCOUNTER — Ambulatory Visit (INDEPENDENT_AMBULATORY_CARE_PROVIDER_SITE_OTHER): Admitting: Physician Assistant

## 2024-02-05 ENCOUNTER — Encounter: Payer: Self-pay | Admitting: Physician Assistant

## 2024-02-05 VITALS — BP 120/86 | HR 73 | Ht 75.0 in | Wt 341.0 lb

## 2024-02-05 DIAGNOSIS — E66813 Obesity, class 3: Secondary | ICD-10-CM | POA: Diagnosis not present

## 2024-02-05 DIAGNOSIS — Z860100 Personal history of colon polyps, unspecified: Secondary | ICD-10-CM | POA: Diagnosis not present

## 2024-02-05 DIAGNOSIS — R1030 Lower abdominal pain, unspecified: Secondary | ICD-10-CM

## 2024-02-05 DIAGNOSIS — Z6841 Body Mass Index (BMI) 40.0 and over, adult: Secondary | ICD-10-CM | POA: Diagnosis not present

## 2024-02-05 HISTORY — DX: Morbid (severe) obesity due to excess calories: E66.01

## 2024-02-05 MED ORDER — NA SULFATE-K SULFATE-MG SULF 17.5-3.13-1.6 GM/177ML PO SOLN: 1.0000 | Freq: Once | ORAL | 0 refills | Status: AC

## 2024-02-05 NOTE — Patient Instructions (Addendum)
 We have placed a referral to weight management and their office will call you to schedule an appointment.  You have been scheduled for a colonoscopy. Please follow written instructions given to you at your visit today.   If you use inhalers (even only as needed), please bring them with you on the day of your procedure.  DO NOT TAKE 7 DAYS PRIOR TO TEST- Trulicity (dulaglutide) Ozempic, Wegovy (semaglutide) Mounjaro (tirzepatide) Bydureon Bcise (exanatide extended release)  DO NOT TAKE 1 DAY PRIOR TO YOUR TEST Rybelsus (semaglutide) Adlyxin (lixisenatide) Victoza (liraglutide) Byetta (exanatide) ___________________________________________________________________________  _______________________________________________________  If your blood pressure at your visit was 140/90 or greater, please contact your primary care physician to follow up on this.  _______________________________________________________  If you are age 33 or older, your body mass index should be between 23-30. Your Body mass index is 42.62 kg/m. If this is out of the aforementioned range listed, please consider follow up with your Primary Care Provider.  If you are age 5 or younger, your body mass index should be between 19-25. Your Body mass index is 42.62 kg/m. If this is out of the aformentioned range listed, please consider follow up with your Primary Care Provider.   ________________________________________________________  The [facility] providers would like to encourage you to use MYCHART to communicate with providers for non-urgent requests or questions.  Due to long hold times on the telephone, sending your provider a message by [facility] may be a faster and more efficient way to get a response.  Please allow 48 business hours for a response.  Please remember that this is for non-urgent requests.  _______________________________________________________

## 2024-02-29 NOTE — Progress Notes (Signed)
 Addendum: Reviewed and agree with assessment and management plan. Asha Grumbine, Carie Caddy, MD

## 2024-03-15 ENCOUNTER — Encounter: Payer: Self-pay | Admitting: Internal Medicine

## 2024-03-17 ENCOUNTER — Encounter: Payer: Self-pay | Admitting: Internal Medicine

## 2024-03-25 ENCOUNTER — Encounter: Admitting: Internal Medicine

## 2024-03-25 ENCOUNTER — Telehealth: Payer: Self-pay | Admitting: Internal Medicine

## 2024-03-25 NOTE — Telephone Encounter (Signed)
 Good Morning,    Inbound call from patient rescheduling today's procedure for 1:00 pm  due to being on a steroids for the past 5 days and also not following dietary restriction guidelines.

## 2024-04-05 ENCOUNTER — Encounter: Payer: Self-pay | Admitting: Family Medicine

## 2024-04-05 ENCOUNTER — Ambulatory Visit: Payer: Self-pay

## 2024-04-05 ENCOUNTER — Ambulatory Visit: Admitting: Family Medicine

## 2024-04-05 VITALS — BP 130/76 | HR 86 | Temp 98.9°F | Wt 334.7 lb

## 2024-04-05 DIAGNOSIS — R109 Unspecified abdominal pain: Secondary | ICD-10-CM

## 2024-04-05 DIAGNOSIS — R197 Diarrhea, unspecified: Secondary | ICD-10-CM | POA: Diagnosis not present

## 2024-04-05 DIAGNOSIS — R1032 Left lower quadrant pain: Secondary | ICD-10-CM

## 2024-04-05 LAB — POC URINALSYSI DIPSTICK (AUTOMATED)
Bilirubin, UA: NEGATIVE
Blood, UA: NEGATIVE
Glucose, UA: NEGATIVE
Ketones, UA: NEGATIVE
Leukocytes, UA: NEGATIVE
Nitrite, UA: NEGATIVE
Protein, UA: NEGATIVE
Spec Grav, UA: 1.02 (ref 1.010–1.025)
Urobilinogen, UA: 0.2 U/dL
pH, UA: 6 (ref 5.0–8.0)

## 2024-04-05 NOTE — Telephone Encounter (Signed)
  FYI Only or Action Required?: FYI only for provider.  Patient was last seen in primary care on 12/05/2023 by Micheal Wolm ORN, MD. Called Nurse Triage reporting Back Pain. Symptoms began yesterday. Interventions attempted: OTC medications: tylenol . Symptoms are: unchanged.  Triage Disposition: See PCP When Office is Open (Within 3 Days)  Patient/caregiver understands and will follow disposition?: Yes    Copied from CRM [id number]. Topic: Clinical - Red Word Triage >> Apr 05, 2024 10:43 AM Adelita E wrote: [facility] that prompted transfer to Nurse Triage: Back/spine pain and stomach pain. Symptoms started at work yesterday, also having diarrhea. Patient does not know if it was food induced or heat induced. Reason for Disposition  [1] MODERATE back pain (e.g., interferes with normal activities) AND [2] present > 3 days  Answer Assessment - Initial Assessment Questions 1. ONSET: When did the pain begin?      Yesterday, at work 2. LOCATION: Where does it hurt? (upper, mid or lower back)     Yesterday it was lower back and stomach, today it's pain in upper back 3. SEVERITY: How bad is the pain?  (e.g., Scale 1-10; mild, moderate, or severe)   - MILD (1-3): Doesn't interfere with normal activities.    - MODERATE (4-7): Interferes with normal activities or awakens from sleep.    - SEVERE (8-10): Excruciating pain, unable to do any normal activities.      mild 4. PATTERN: Is the pain constant? (e.g., yes, no; constant, intermittent)      constant 5. RADIATION: Does the pain shoot into your legs or somewhere else?     Leg pain 6. CAUSE:  What do you think is causing the back pain?      unknown 7. BACK OVERUSE:  Any recent lifting of heavy objects, strenuous work or exercise?     no 8. MEDICINES: What have you taken so far for the pain? (e.g., nothing, acetaminophen , NSAIDS)     tylenol  9. NEUROLOGIC SYMPTOMS: Do you have any weakness, numbness, or problems with  bowel/bladder control?     tired 10. OTHER SYMPTOMS: Do you have any other symptoms? (e.g., fever, abdomen pain, burning with urination, blood in urine)       Nausea, diarrhea, 11. PREGNANCY: Is there any chance you are pregnant? When was your last menstrual period?       na  Protocols used: Back Pain-A-AH

## 2024-04-05 NOTE — Progress Notes (Unsigned)
 Established Patient Office Visit  Subjective   Patient ID: Francis Lin, male    DOB: [birth year]  Age: 48 y.o. MRN: [id number]  Chief Complaint  Patient presents with   Abdominal Pain   Back Pain    HPI  {History (Optional):23778} Francis Lin is seen with onset yesterday of left flank pain, left lower abdominal pain, diarrhea, chills without documented fever, nausea without vomiting.  He states he had up to 8 nonbloody diarrhea episodes yesterday.  Has only had 1 bowel movement today.  He does have past history of C. difficile and was concerned because of that.  No recent antibiotics.  No history of kidney stones.  Has had some continued mild left flank pain.  Pain radiates anteriorly and toward the left lower quadrant.  Pain was worse yesterday.  Denies any injury.  No known sick contacts.  No burning with urination.  No known history of diverticulitis.  He has scheduled colonoscopy around 1 August  Past Medical History:  Diagnosis Date   Acid reflux    Allergy    Anxiety    stress    CKD stage 3a, GFR 45-59 ml/min (HCC)    Clotting disorder (HCC)    right leg x 2 in calf and behind knee    DVT (deep venous thrombosis) (HCC)    Esophagitis    Gout    Hypersomnia, persistent    IBS (irritable bowel syndrome)    Lung nodules    Morbid (severe) obesity due to excess calories (HCC) 02/05/2024   bmi 42.62   PONV (postoperative nausea and vomiting)    nausea with no vomiting    Renal disorder    Shifting sleep-work schedule, affecting sleep 06/20/2013   Snoring disorder 06/20/2013   Thyroid  nodule    Tubular adenoma of colon    Past Surgical History:  Procedure Laterality Date   COLON SURGERY     COLONOSCOPY     CYST REMOVAL NECK  04/17/2023   ESOPHAGEAL DILATION     HEMORRHOID BANDING     ORIF TIBIA & FIBULA FRACTURES Right    right leg surgery x 2 places- rod in place knee to ankle    UPPER GASTROINTESTINAL ENDOSCOPY     wisdom teeth     wrist surgery Right      reports that he has never smoked. He has been exposed to tobacco smoke. He has never used smokeless tobacco. He reports that he does not drink alcohol and does not use drugs. family history includes ADD / ADHD in his daughter and daughter; Adrenal disorder in his sister; Arthritis in his father; Asthma in his daughter; Atrial fibrillation in his father; Bipolar disorder in his sister; Cancer in his father; Diabetes in his father and sister; Eczema in his daughter, daughter, and daughter; Edema in his sister; Gout in his father; Heart attack in his father; High Cholesterol in his daughter; Hypertension in his father; Hypotension in his daughter; Kidney failure in his father; Lupus in his maternal aunt and mother; Obesity in his sister; Stroke in his sister. Allergies  Allergen Reactions   Peanuts [Peanut Oil] Anaphylaxis   Lactose Diarrhea and Other (See Comments)    Lactose intolerance Lactose intolerance Lactose intolerance   Allopurinol      rash   Caffeine Other (See Comments)    makes feel bad, headaches   Gabapentin Other (See Comments)    Messes with memory   Ibuprofen  Other (See Comments)    Early onset of kidney  issues (Stage 3A)   Omeprazole Other (See Comments)    Mood swing   Penicillins Other (See Comments)    Headaches    Seasonal Ic [Cholestatin]     Sneezing,runny nose , itchy eyes   Tape     Adhesive tape- caused rash    Review of Systems  Constitutional:  Negative for chills and fever.  Cardiovascular:  Negative for chest pain.  Gastrointestinal:  Positive for abdominal pain and diarrhea. Negative for blood in stool, constipation, melena and vomiting.  Genitourinary:  Positive for flank pain. Negative for dysuria, hematuria and urgency.      Objective:     BP 130/76 (BP Location: Left Arm, Patient Position: Sitting, Cuff Size: Large)   Pulse 86   Temp 98.9 F (37.2 C) (Oral)   Wt (!) 334 lb 11.2 oz (151.8 kg)   SpO2 96%   BMI 41.83 kg/m  BP Readings  from Last 3 Encounters:  04/05/24 130/76  02/05/24 120/86  01/20/24 121/67   Wt Readings from Last 3 Encounters:  04/05/24 (!) 334 lb 11.2 oz (151.8 kg)  02/05/24 (!) 341 lb (154.7 kg)  01/20/24 (!) 330 lb (149.7 kg)      Physical Exam Vitals reviewed.  Constitutional:      General: He is not in acute distress.    Appearance: He is not ill-appearing.   Cardiovascular:     Rate and Rhythm: Normal rate and regular rhythm.  Abdominal:     Comments: Abdomen nondistended.  Normal bowel sounds.  Soft with no reproducible tenderness.  No masses palpated.  No guarding or rebound.   Neurological:     Mental Status: He is alert.      Results for orders placed or performed in visit on 04/05/24  POCT Urinalysis Dipstick (Automated)  Result Value Ref Range   Color, UA Yellow    Clarity, UA Clear    Glucose, UA Negative Negative   Bilirubin, UA Negative    Ketones, UA Negative    Spec Grav, UA 1.020 1.010 - 1.025   Blood, UA Negative    pH, UA 6.0 5.0 - 8.0   Protein, UA Negative Negative   Urobilinogen, UA 0.2 0.2 or 1.0 E.U./dL   Nitrite, UA Negative    Leukocytes, UA Negative Negative    Last CBC Lab Results  Component Value Date   WBC 9.9 01/20/2024   HGB 14.6 01/20/2024   HCT 43.4 01/20/2024   MCV 89.5 01/20/2024   MCH 30.1 01/20/2024   RDW 14.2 01/20/2024   PLT 197 01/20/2024   Last metabolic panel Lab Results  Component Value Date   GLUCOSE 108 (H) 01/20/2024   NA 137 01/20/2024   K 3.7 01/20/2024   CL 105 01/20/2024   CO2 23 01/20/2024   BUN 14 01/20/2024   CREATININE 1.42 (H) 01/20/2024   GFRNONAA >60 01/20/2024   CALCIUM 8.4 (L) 01/20/2024   PROT 7.3 01/20/2024   ALBUMIN 3.8 01/20/2024   BILITOT 0.7 01/20/2024   ALKPHOS 71 01/20/2024   AST 58 (H) 01/20/2024   ALT 44 01/20/2024   ANIONGAP 9 01/20/2024   Last lipids Lab Results  Component Value Date   CHOL 145 12/05/2023   HDL 40.20 12/05/2023   LDLCALC 86 12/05/2023   TRIG 92.0 12/05/2023    CHOLHDL 4 12/05/2023   Last hemoglobin A1c Lab Results  Component Value Date   HGBA1C 5.5 06/14/2023      The 10-year ASCVD risk score (Arnett DK, et  al., 2019) is: 4.6%    Assessment & Plan:   Problem List Items Addressed This Visit   None Visit Diagnoses       Left flank pain    -  Primary   Relevant Orders   POCT Urinalysis Dipstick (Automated) (Completed)     Patient presents with onset yesterday of diarrhea, left flank pain with radiation to her left lower quadrant.  No known history of kidney stones.  Symptoms improved today.  Nonacute abdomen.  No localizing tenderness.  Urine dipstick reveals no blood or other abnormalities.  This makes kidney stone less likely.  Etiology of diarrhea unclear but may be viral.  -Handout on appropriate diet for diarrhea given - Follow-up immediately for any fever or worsening abdominal pain - Work note written for today and tomorrow  No follow-ups on file.    Wolm Scarlet, MD

## 2024-05-09 ENCOUNTER — Other Ambulatory Visit: Payer: Managed Care, Other (non HMO)

## 2024-05-14 ENCOUNTER — Ambulatory Visit (AMBULATORY_SURGERY_CENTER)

## 2024-05-14 ENCOUNTER — Encounter: Payer: Self-pay | Admitting: Internal Medicine

## 2024-05-14 VITALS — Ht 75.0 in | Wt 335.0 lb

## 2024-05-14 DIAGNOSIS — Z860100 Personal history of colon polyps, unspecified: Secondary | ICD-10-CM

## 2024-05-14 NOTE — Progress Notes (Signed)

## 2024-05-16 ENCOUNTER — Ambulatory Visit: Payer: Managed Care, Other (non HMO) | Admitting: "Endocrinology

## 2024-06-04 ENCOUNTER — Ambulatory Visit: Admitting: Internal Medicine

## 2024-06-04 ENCOUNTER — Encounter: Payer: Self-pay | Admitting: Internal Medicine

## 2024-06-04 VITALS — BP 126/73 | HR 68 | Temp 97.0°F | Resp 12 | Ht 75.0 in | Wt 335.0 lb

## 2024-06-04 DIAGNOSIS — K648 Other hemorrhoids: Secondary | ICD-10-CM | POA: Diagnosis not present

## 2024-06-04 DIAGNOSIS — D124 Benign neoplasm of descending colon: Secondary | ICD-10-CM | POA: Diagnosis not present

## 2024-06-04 DIAGNOSIS — Z1211 Encounter for screening for malignant neoplasm of colon: Secondary | ICD-10-CM | POA: Diagnosis present

## 2024-06-04 DIAGNOSIS — Z860100 Personal history of colon polyps, unspecified: Secondary | ICD-10-CM

## 2024-06-04 MED ORDER — SODIUM CHLORIDE 0.9 % IV SOLN: 500.0000 mL | Freq: Once | INTRAVENOUS | Status: DC

## 2024-06-04 NOTE — Progress Notes (Signed)
 To pacu, VSS. Report to [email or web address]

## 2024-06-04 NOTE — Patient Instructions (Addendum)
 Please read handouts provided. Continue present medications. Await pathology results. Resume previous diet. Repeat colonoscopy in 5 years for screening.   YOU HAD AN ENDOSCOPIC PROCEDURE TODAY AT THE [facility]:   Refer to the procedure report that was given to you for any specific questions about what was found during the examination.  If the procedure report does not answer your questions, please call your gastroenterologist to clarify.  If you requested that your care partner not be given the details of your procedure findings, then the procedure report has been included in a sealed envelope for you to review at your convenience later.  YOU SHOULD EXPECT: Some feelings of bloating in the abdomen. Passage of more gas than usual.  Walking can help get rid of the air that was put into your GI tract during the procedure and reduce the bloating. If you had a lower endoscopy (such as a colonoscopy or flexible sigmoidoscopy) you may notice spotting of blood in your stool or on the toilet paper. If you underwent a bowel prep for your procedure, you may not have a normal bowel movement for a few days.  Please Note:  You might notice some irritation and congestion in your nose or some drainage.  This is from the oxygen used during your procedure.  There is no need for concern and it should clear up in a day or so.  SYMPTOMS TO REPORT IMMEDIATELY:  Following lower endoscopy (colonoscopy or flexible sigmoidoscopy):  Excessive amounts of blood in the stool  Significant tenderness or worsening of abdominal pains  Swelling of the abdomen that is new, acute  Fever of 100F or higher.  For urgent or emergent issues, a gastroenterologist can be reached at any hour by calling [phone]. Do not use MyChart messaging for urgent concerns.    DIET:  We do recommend a small meal at first, but then you may proceed to your regular diet.  Drink plenty of fluids but you should avoid alcoholic  beverages for 24 hours.  ACTIVITY:  You should plan to take it easy for the rest of today and you should NOT DRIVE or use heavy machinery until tomorrow (because of the sedation medicines used during the test).    FOLLOW UP: Our staff will call the number listed on your records the next business day following your procedure.  We will call around 7:15- 8:00 am to check on you and address any questions or concerns that you may have regarding the information given to you following your procedure. If we do not reach you, we will leave a message.     If any biopsies were taken you will be contacted by phone or by letter within the next 1-3 weeks.  Please call us at [phone] if you have not heard about the biopsies in 3 weeks.    SIGNATURES/CONFIDENTIALITY: You and/or your care partner have signed paperwork which will be entered into your electronic medical record.  These signatures attest to the fact that that the information above on your After Visit Summary has been reviewed and is understood.  Full responsibility of the confidentiality of this discharge information lies with you and/or your care-partner.

## 2024-06-04 NOTE — Progress Notes (Signed)
 Pt's states no medical or surgical changes since previsit or office visit.

## 2024-06-04 NOTE — Op Note (Signed)
 [facility] Patient Name: Francis Lin Procedure Date: 06/04/2024 2:10 PM MRN: [id number] Endoscopist: Gordy CHRISTELLA Starch , MD, [id number] Age: 48 Referring MD:  Date of Birth: [birth year] Gender: Male Account #: [id number] Procedure:                Colonoscopy Indications:              High risk colon cancer surveillance: Personal                            history of adenoma (10 mm or greater in size), Last                            colonoscopy: April 2022 (TA x 3 with one > 1 cm) Medicines:                Monitored Anesthesia Care Procedure:                Pre-Anesthesia Assessment:                           - Prior to the procedure, a History and Physical                            was performed, and patient medications and                            allergies were reviewed. The patient's tolerance of                            previous anesthesia was also reviewed. The risks                            and benefits of the procedure and the sedation                            options and risks were discussed with the patient.                            All questions were answered, and informed consent                            was obtained. Prior Anticoagulants: The patient has                            taken no anticoagulant or antiplatelet agents. ASA                            Grade Assessment: III - A patient with severe                            systemic disease. After reviewing the risks and                            benefits, the patient was deemed in satisfactory  condition to undergo the procedure.                           After obtaining informed consent, the colonoscope                            was passed under direct vision. Throughout the                            procedure, the patient's blood pressure, pulse, and                            oxygen saturations were monitored continuously. The                            Olympus Scope  SN [id number] was introduced through the                            anus and advanced to the cecum, identified by                            appendiceal orifice and ileocecal valve. The                            colonoscopy was performed without difficulty. The                            patient tolerated the procedure well. The quality                            of the bowel preparation was good. The ileocecal                            valve, appendiceal orifice, and rectum were                            photographed. Scope In: 2:21:41 PM Scope Out: 2:32:31 PM Scope Withdrawal Time: 0 hours 9 minutes 33 seconds  Total Procedure Duration: 0 hours 10 minutes 50 seconds  Findings:                 The digital rectal exam was normal.                           A 4 mm polyp was found in the descending colon. The                            polyp was sessile. The polyp was removed with a                            cold snare. Resection and retrieval were complete.                           Internal hemorrhoids were found during  retroflexion. The hemorrhoids were small.                           The exam was otherwise without abnormality. Complications:            No immediate complications. Estimated Blood Loss:     Estimated blood loss: none. Impression:               - One 4 mm polyp in the descending colon, removed                            with a cold snare. Resected and retrieved.                           - Small internal hemorrhoids.                           - The examination was otherwise normal. Recommendation:           - Patient has a contact number available for                            emergencies. The signs and symptoms of potential                            delayed complications were discussed with the                            patient. Return to normal activities tomorrow.                            Written discharge instructions were provided to  the                            patient.                           - Resume previous diet.                           - Continue present medications.                           - Await pathology results.                           - Repeat colonoscopy in 5 years for surveillance. Gordy CHRISTELLA Starch, MD 06/04/2024 2:36:05 PM This report has been signed electronically.

## 2024-06-04 NOTE — Progress Notes (Signed)
 GASTROENTEROLOGY PROCEDURE H&P NOTE   Primary Care Physician: Micheal Wolm ORN, MD    Reason for Procedure:   Hx of polyps  Plan:    colonoscopy  Patient is appropriate for endoscopic procedure(s) in the ambulatory ([facility]) setting.  The nature of the procedure, as well as the risks, benefits, and alternatives were carefully and thoroughly reviewed with the patient. Ample time for discussion and questions allowed. The patient understood, was satisfied, and agreed to proceed.     HPI: Francis Lin is a 48 y.o. male who presents for colonoscopy.  Medical history as below.  Tolerated the prep.  No recent chest pain or shortness of breath.  No abdominal pain today.  Past Medical History:  Diagnosis Date   Acid reflux    Allergy    Anxiety    stress    CKD stage 3a, GFR 45-59 ml/min (HCC)    Clotting disorder (HCC)    right leg x 2 in calf and behind knee    DVT (deep venous thrombosis) (HCC)    Esophagitis    Gout    Hypersomnia, persistent    IBS (irritable bowel syndrome)    Lung nodules    Morbid (severe) obesity due to excess calories (HCC) 02/05/2024   bmi 42.62   PONV (postoperative nausea and vomiting)    nausea with no vomiting    Renal disorder    Shifting sleep-work schedule, affecting sleep 06/20/2013   Snoring disorder 06/20/2013   Thyroid  nodule    Tubular adenoma of colon     Past Surgical History:  Procedure Laterality Date   COLON SURGERY     COLONOSCOPY     CYST REMOVAL NECK  04/17/2023   ESOPHAGEAL DILATION     HEMORRHOID BANDING     ORIF TIBIA & FIBULA FRACTURES Right    right leg surgery x 2 places- rod in place knee to ankle    UPPER GASTROINTESTINAL ENDOSCOPY     wisdom teeth     wrist surgery Right     Prior to Admission medications   Medication Sig Start Date End Date Taking? Authorizing Provider  acetaminophen  (TYLENOL ) 325 MG tablet Take 650 mg by mouth every 6 (six) hours as needed for mild pain.   Yes [provider]  fluocinolone (VANOS) 0.01 % cream Apply 1 Application topically 2 (two) times daily as needed (Itching).   Yes [provider]  alum & mag hydroxide-simeth (MAALOX PLUS) 400-400-40 MG/5ML suspension Take 30 mLs by mouth every 6 (six) hours as needed for indigestion. 01/20/24   Mesner, Selinda, MD  colchicine  0.6 MG tablet Take 0.6 mg 1 tablet by mouth daily. 10/07/22   Cheryl Waddell HERO, PA-C  cyanocobalamin  1000 MCG tablet Take 1 tablet (1,000 mcg total) by mouth daily. 05/13/22   Danton Reyes DASEN, MD  febuxostat  (ULORIC ) 40 MG tablet Take 1 tablet (40 mg total) by mouth daily. 10/07/22   Cheryl Waddell HERO, PA-C  fluticasone (FLONASE) 50 MCG/ACT nasal spray Place 1 spray into both nostrils daily as needed for allergies or rhinitis.    [provider]  loratadine (CLARITIN) 10 MG tablet Take 10 mg by mouth daily as needed for allergies.    [provider]  methocarbamol  (ROBAXIN ) 500 MG tablet Take 500 mg by mouth as needed. 03/18/24   [provider]  Multiple Vitamin (MULTIVITAMIN) tablet Take 1 tablet by mouth daily.    [provider]  cimetidine (TAGAMET) 200 MG tablet Take  by mouth. 01/30/19 01/16/20  [provider]    Current Outpatient Medications  Medication Sig Dispense Refill   acetaminophen  (TYLENOL ) 325 MG tablet Take 650 mg by mouth every 6 (six) hours as needed for mild pain.     fluocinolone (VANOS) 0.01 % cream Apply 1 Application topically 2 (two) times daily as needed (Itching).     alum & mag hydroxide-simeth (MAALOX PLUS) 400-400-40 MG/5ML suspension Take 30 mLs by mouth every 6 (six) hours as needed for indigestion. 355 mL 0   colchicine  0.6 MG tablet Take 0.6 mg 1 tablet by mouth daily. 90 tablet 0   cyanocobalamin  1000 MCG tablet Take 1 tablet (1,000 mcg total) by mouth daily.     febuxostat  (ULORIC ) 40 MG tablet Take 1 tablet (40 mg total) by mouth daily. 30 tablet 2   fluticasone (FLONASE) 50 MCG/ACT nasal spray Place 1 spray  into both nostrils daily as needed for allergies or rhinitis.     loratadine (CLARITIN) 10 MG tablet Take 10 mg by mouth daily as needed for allergies.     methocarbamol  (ROBAXIN ) 500 MG tablet Take 500 mg by mouth as needed.     Multiple Vitamin (MULTIVITAMIN) tablet Take 1 tablet by mouth daily.     Current Facility-Administered Medications  Medication Dose Route Frequency Provider Last Rate Last Admin   0.9 %  sodium chloride  infusion  500 mL Intravenous Once Roselie Cirigliano, Gordy HERO, MD        Allergies as of 06/04/2024 - Review Complete 06/04/2024  Allergen Reaction Noted   Ibuprofen  Other (See Comments) 05/10/2022   Peanuts [peanut oil] Anaphylaxis 04/15/2012   Lactose Diarrhea and Other (See Comments) 07/21/2016   Allopurinol  Rash 11/09/2021   Caffeine Other (See Comments)    Gabapentin Other (See Comments) 11/02/2021   Omeprazole Other (See Comments) 03/15/2012   Penicillins Other (See Comments)    Seasonal ic [cholestatin] Itching 06/20/2013   Tape Rash 03/09/2023    Family History  Problem Relation Age of Onset   Lupus Mother    Heart attack Father    Arthritis Father    Hypertension Father    Cancer Father        multiple myeloma   Diabetes Father    Atrial fibrillation Father    Gout Father    Kidney failure Father    Edema Sister    Diabetes Sister    Bipolar disorder Sister    Stroke Sister    Obesity Sister    Adrenal disorder Sister    Lupus Maternal Aunt    Eczema Daughter    ADD / ADHD Daughter    Asthma Daughter    Eczema Daughter    ADD / ADHD Daughter    Hypotension Daughter    Eczema Daughter    High Cholesterol Daughter    Colon cancer Neg Hx    Colon polyps Neg Hx    Esophageal cancer Neg Hx    Rectal cancer Neg Hx    Stomach cancer Neg Hx     Social History   Socioeconomic History   Marital status: Married    Spouse name: Not on file   Number of children: 3   Years of education: college   Highest education level: Not on file   Occupational History    Employer: [employer]    Comment: [employer]  Tobacco Use   Smoking status: Never    Passive exposure: Past   Smokeless tobacco: Never  Vaping Use  Vaping status: Never Used  Substance and Sexual Activity   Alcohol use: No   Drug use: No   Sexual activity: Not on file  Other Topics Concern   Not on file  Social History Narrative   Right handed   Caffeine use: tea sometimes   Social Drivers of Health   Financial Resource Strain: Medium Risk (12/05/2023)   Overall Financial Resource Strain (CARDIA)    Difficulty of Paying Living Expenses: Somewhat hard  Food Insecurity: No Food Insecurity (12/05/2023)   Hunger Vital Sign    Worried About Running Out of Food in the Last Year: Never true    Ran Out of Food in the Last Year: Never true  Transportation Needs: Not on file  Physical Activity: Inactive (12/05/2023)   Exercise Vital Sign    Days of Exercise per Week: 0 days    Minutes of Exercise per Session: 0 min  Stress: Stress Concern Present (12/05/2023)   [employer] of Occupational Health - Occupational Stress Questionnaire    Feeling of Stress : Rather much  Social Connections: Unknown (02/28/2023)   Received from [facility]   Social Network    Social Network: Not on file  Intimate Partner Violence: Unknown (02/28/2023)   Received from [facility]   HITS    Physically Hurt: Not on file    Insult or Talk Down To: Not on file    Threaten Physical Harm: Not on file    Scream or Curse: Not on file    Physical Exam: Vital signs in last 24 hours: @BP  (!) 134/93   Pulse 66   Temp (!) 97 F (36.1 C) (Other (Comment)) Comment (Src): forehead  Ht 6' 3 (1.905 m)   Wt (!) 335 lb (152 kg)   SpO2 100%   BMI 41.87 kg/m  GEN: NAD EYE: Sclerae anicteric ENT: MMM CV: Non-tachycardic Pulm: CTA b/l GI: Soft, NT/ND NEURO:  Alert & Oriented x 3   Gordy Starch, MD [facility]  06/04/2024 2:12 PM

## 2024-06-05 ENCOUNTER — Telehealth: Payer: Self-pay | Admitting: Lactation Services

## 2024-06-05 NOTE — Telephone Encounter (Signed)
 No answer left voice mail

## 2024-06-07 LAB — SURGICAL PATHOLOGY

## 2024-06-12 ENCOUNTER — Ambulatory Visit: Payer: Self-pay | Admitting: Internal Medicine

## 2024-06-17 ENCOUNTER — Telehealth: Payer: Self-pay | Admitting: Gastroenterology

## 2024-06-17 NOTE — Telephone Encounter (Signed)
 Page received to the on-call with 2 concerns. 1) patient thinks he still tastes anesthesia from his colonoscopy on 8/26.  Only occurs whenever he has a bowel movement.  I assured him he does not still have propofol circulating in his system. 2) has intermittent scant BRB on tissue paper.  Has had the symptoms for years but had an episode today.  Again, described as scant BRB on tissue paper after bowel movement.  Occasionally will have dyschezia in the past, but not recently.  Discussed conservative measures for management of small internal hemorrhoids noted at the time of his recent colonoscopy.  Has had hemorrhoid banding in the past.  Could consider repeat banding.  If dyschezia or concern for fissure, discussed the role/utility of topical NTG if needed in the future.  Recommend starting fiber supplement.  All questions answered and appreciative for phone call.

## 2024-06-18 ENCOUNTER — Encounter: Payer: Self-pay | Admitting: "Endocrinology

## 2024-06-18 ENCOUNTER — Ambulatory Visit: Admitting: "Endocrinology

## 2024-06-18 VITALS — BP 130/80 | HR 84 | Ht 75.0 in | Wt 340.0 lb

## 2024-06-18 DIAGNOSIS — E042 Nontoxic multinodular goiter: Secondary | ICD-10-CM

## 2024-06-18 NOTE — Progress Notes (Signed)
 Outpatient Endocrinology Note Obadiah Birmingham, MD  06/18/24   Francis Lin [birth year] [id number]  Referring Provider: Micheal Wolm ORN, MD Primary Care Provider: Micheal Wolm ORN, MD Subjective  No chief complaint on file.   Assessment & Plan  Diagnoses and all orders for this visit:  Multinodular goiter -     Cancel: US  THYROID  -     US  THYROID ; Future -     US  THYROID    Francis Lin is not on any thyroid  medication TSH, FT4 WNL in past, last TSH checked in 11/2023 WNL No medications indicated  02/2023 thyroid  ultrasound and 12/2022 CT chest images and report reviewed No tracheal compression recognized on CT chest Thyroid  ultrasound reported thyromegaly with two 2 cm isthmus TR3  thyroid  nodule needing follow-up in 1 year and another 2 cm Right mid TR2 nodule not meeting criteria for any follow-up/FNA Ordered follow up thyroid  ultrasound  I have reviewed current medications, nurse's notes, allergies, vital signs, past medical and surgical history, family medical history, and social history for this encounter. Counseled patient on symptoms, examination findings, lab findings, imaging results, treatment decisions and monitoring and prognosis. The patient understood the recommendations and agrees with the treatment plan. All questions regarding treatment plan were fully answered.   Return in about 1 year (around 06/18/2025).   Obadiah Birmingham, MD  06/18/24   I have reviewed current medications, nurse's notes, allergies, vital signs, past medical and surgical history, family medical history, and social history for this encounter. Counseled patient on symptoms, examination findings, lab findings, imaging results, treatment decisions and monitoring and prognosis. The patient understood the recommendations and agrees with the treatment plan. All questions regarding treatment plan were fully answered.   History of Present Illness Francis Lin is a 48 y.o. year  old male who presents to our clinic with multinodular goiter diagnosed in 2024.    Hs been working third shift for 18 years. SOB on exertion  Compressive symptoms:  dysphagia  No dysphonia  Yes, sometimes positional dyspnea (especially with simultaneous arms elevation)  No  Smokes  No On biotin  No Personal history of head/neck thyroid  surgery/irradiation  No  12/2022 CT chest  2.5 cm low-attenuation right thyroid  nodule.   THYROID  ULTRASOUND   TECHNIQUE: Ultrasound examination of the thyroid  gland and adjacent soft tissues was performed.   COMPARISON:  Chest CT-12/27/2022   FINDINGS: Parenchymal Echotexture: Normal   Isthmus: Enlarged measuring 1.1 cm in diameter   Right lobe: Enlarged measuring 6.4 x 2.5 x 2.3 cm   Left lobe: Enlarged measuring 6.2 x 2.5 x 2.2 cm   _________________________________________________________   Estimated total number of nodules >/= 1 cm: 2   Number of spongiform nodules >/=  2 cm not described below (TR1): 0   Number of mixed cystic and solid nodules >/= 1.5 cm not described below (TR2): 0   _________________________________________________________   Nodule # 1:   Location: Isthmus; Mid - correlates with the nodule questioned on preceding chest CT   Maximum size: 2.0 cm; Other 2 dimensions: 1.4 x 1.1 cm   Composition: solid/almost completely solid (2)   Echogenicity: isoechoic (1) 2 cm Shape: not taller-than-wide (0)   Margins: ill-defined (0)   Echogenic foci: none (0)   ACR TI-RADS total points: 3.   ACR TI-RADS risk category: TR3 (3 points).   ACR TI-RADS recommendations:   *Given size (>/= 1.5 - 2.4 cm) and appearance, a follow-up ultrasound in 1 year should be  considered based on TI-RADS criteria.   _________________________________________________________   Nodule # 2:   Location: Right; Mid   Maximum size: 2.0 cm; Other 2 dimensions: 1.5 x 1.1 cm   Composition: mixed cystic and solid (1)    Echogenicity: isoechoic (1)   Shape: not taller-than-wide (0)   Margins: ill-defined (0)   Echogenic foci: none (0)   ACR TI-RADS total points: 2.   ACR TI-RADS risk category: TR2 (2 points).   ACR TI-RADS recommendations:   This nodule does NOT meet TI-RADS criteria for biopsy or dedicated follow-up.   _________________________________________________________   IMPRESSION: 1. Thyromegaly with findings suggestive of multinodular goiter. 2. Nodule labeled #1 which correlates with the nodule questioned on preceding chest CT, meets imaging criteria to recommend a 1 year follow-up. 3. Solitary additional right-sided mixed cystic and solid nodule does not meet criteria to recommend dedicated follow-up.  Physical Exam  BP 130/80   Pulse 84   Ht 6' 3 (1.905 m)   Wt (!) 340 lb (154.2 kg)   SpO2 96%   BMI 42.50 kg/m  Constitutional: well developed, well nourished Head: normocephalic, atraumatic, no exophthalmos Eyes: sclera anicteric, no redness Neck: + thyromegaly, no thyroid  tenderness; - nodules palpated, Pemberton's sign -ve Lungs: normal respiratory effort Neurology: alert and oriented, no fine hand tremor Skin: dry, no appreciable rashes Musculoskeletal: no appreciable defects Psychiatric: normal mood and affect  Allergies Allergies  Allergen Reactions   Ibuprofen  Other (See Comments)    Early onset of kidney issues (Stage 3A)   Peanuts [Peanut Oil] Anaphylaxis   Lactose Diarrhea and Other (See Comments)    Lactose intolerance Lactose intolerance Lactose intolerance   Allopurinol  Rash    rash   Caffeine Other (See Comments)    makes feel bad, headaches   Gabapentin Other (See Comments)    Messes with memory   Omeprazole Other (See Comments)    Mood swing   Penicillins Other (See Comments)    Headaches    Seasonal Ic [Cholestatin] Itching    Sneezing,runny nose , itchy eyes   Tape Rash    Adhesive tape- caused rash    Current  Medications Patient's Medications  New Prescriptions   No medications on file  Previous Medications   ACETAMINOPHEN  (TYLENOL ) 325 MG TABLET    Take 650 mg by mouth every 6 (six) hours as needed for mild pain.   ALUM & MAG HYDROXIDE-SIMETH (MAALOX PLUS) 400-400-40 MG/5ML SUSPENSION    Take 30 mLs by mouth every 6 (six) hours as needed for indigestion.   COLCHICINE  0.6 MG TABLET    Take 0.6 mg 1 tablet by mouth daily.   CYANOCOBALAMIN  1000 MCG TABLET    Take 1 tablet (1,000 mcg total) by mouth daily.   FEBUXOSTAT  (ULORIC ) 40 MG TABLET    Take 1 tablet (40 mg total) by mouth daily.   FLUOCINOLONE (VANOS) 0.01 % CREAM    Apply 1 Application topically 2 (two) times daily as needed (Itching).   FLUTICASONE (FLONASE) 50 MCG/ACT NASAL SPRAY    Place 1 spray into both nostrils daily as needed for allergies or rhinitis.   LORATADINE (CLARITIN) 10 MG TABLET    Take 10 mg by mouth daily as needed for allergies.   METHOCARBAMOL  (ROBAXIN ) 500 MG TABLET    Take 500 mg by mouth as needed.   MULTIPLE VITAMIN (MULTIVITAMIN) TABLET    Take 1 tablet by mouth daily.  Modified Medications   No medications on file  Discontinued Medications  No medications on file    Past Medical History Past Medical History:  Diagnosis Date   Acid reflux    Allergy    Anxiety    stress    CKD stage 3a, GFR 45-59 ml/min (HCC)    Clotting disorder (HCC)    right leg x 2 in calf and behind knee    DVT (deep venous thrombosis) (HCC)    Esophagitis    Gout    Hypersomnia, persistent    IBS (irritable bowel syndrome)    Lung nodules    Morbid (severe) obesity due to excess calories (HCC) 02/05/2024   bmi 42.62   PONV (postoperative nausea and vomiting)    nausea with no vomiting    Renal disorder    Shifting sleep-work schedule, affecting sleep 06/20/2013   Snoring disorder 06/20/2013   Thyroid  nodule    Tubular adenoma of colon     Past Surgical History Past Surgical History:  Procedure Laterality Date    COLON SURGERY     COLONOSCOPY     CYST REMOVAL NECK  04/17/2023   ESOPHAGEAL DILATION     HEMORRHOID BANDING     ORIF TIBIA & FIBULA FRACTURES Right    right leg surgery x 2 places- rod in place knee to ankle    UPPER GASTROINTESTINAL ENDOSCOPY     wisdom teeth     wrist surgery Right     Family History family history includes ADD / ADHD in his daughter and daughter; Adrenal disorder in his sister; Arthritis in his father; Asthma in his daughter; Atrial fibrillation in his father; Bipolar disorder in his sister; Cancer in his father; Diabetes in his father and sister; Eczema in his daughter, daughter, and daughter; Edema in his sister; Gout in his father; Heart attack in his father; High Cholesterol in his daughter; Hypertension in his father; Hypotension in his daughter; Kidney failure in his father; Lupus in his maternal aunt and mother; Obesity in his sister; Stroke in his sister.  Social History Social History   Socioeconomic History   Marital status: Married    Spouse name: Not on file   Number of children: 3   Years of education: college   Highest education level: Not on file  Occupational History    Employer: [employer]    Comment: [employer]  Tobacco Use   Smoking status: Never    Passive exposure: Past   Smokeless tobacco: Never  Vaping Use   Vaping status: Never Used  Substance and Sexual Activity   Alcohol use: No   Drug use: No   Sexual activity: Not on file  Other Topics Concern   Not on file  Social History Narrative   Right handed   Caffeine use: tea sometimes   Social Drivers of Health   Financial Resource Strain: Medium Risk (12/05/2023)   Overall Financial Resource Strain (CARDIA)    Difficulty of Paying Living Expenses: Somewhat hard  Food Insecurity: No Food Insecurity (12/05/2023)   Hunger Vital Sign    Worried About Running Out of Food in the Last Year: Never true    Ran Out of Food in the Last Year: Never true  Transportation Needs:  Not on file  Physical Activity: Inactive (12/05/2023)   Exercise Vital Sign    Days of Exercise per Week: 0 days    Minutes of Exercise per Session: 0 min  Stress: Stress Concern Present (12/05/2023)   [employer] of Occupational Health - Occupational Stress Questionnaire  Feeling of Stress : Rather much  Social Connections: Unknown (02/28/2023)   Received from [facility]   Social Network    Social Network: Not on file  Intimate Partner Violence: Unknown (02/28/2023)   Received from [facility]   HITS    Physically Hurt: Not on file    Insult or Talk Down To: Not on file    Threaten Physical Harm: Not on file    Scream or Curse: Not on file    Laboratory Investigations Lab Results  Component Value Date   TSH 2.06 12/05/2023   TSH 2.57 05/11/2023   TSH 2.314 05/10/2022   FREET4 1.15 05/11/2023   FREET4 1.34 08/17/2012     No results found for: TSI   No components found for: TRAB   Lab Results  Component Value Date   CHOL 145 12/05/2023   Lab Results  Component Value Date   HDL 40.20 12/05/2023   Lab Results  Component Value Date   LDLCALC 86 12/05/2023   Lab Results  Component Value Date   TRIG 92.0 12/05/2023   Lab Results  Component Value Date   CHOLHDL 4 12/05/2023   Lab Results  Component Value Date   CREATININE 1.42 (H) 01/20/2024   Lab Results  Component Value Date   GFR 61.46 12/05/2023      Component Value Date/Time   NA 137 01/20/2024 0057   NA 139 09/15/2022 0000   K 3.7 01/20/2024 0057   CL 105 01/20/2024 0057   CO2 23 01/20/2024 0057   GLUCOSE 108 (H) 01/20/2024 0057   BUN 14 01/20/2024 0057   BUN 16 09/15/2022 0000   CREATININE 1.42 (H) 01/20/2024 0057   CREATININE 1.30 (H) 08/02/2022 1454   CALCIUM 8.4 (L) 01/20/2024 0057   PROT 7.3 01/20/2024 0057   PROT 6.6 04/13/2018 0000   ALBUMIN 3.8 01/20/2024 0057   ALBUMIN 3.8 04/13/2018 0000   AST 58 (H) 01/20/2024 0057   ALT 44 01/20/2024 0057   ALKPHOS 71 01/20/2024  0057   BILITOT 0.7 01/20/2024 0057   BILITOT 0.4 04/13/2018 0000   GFRNONAA >60 01/20/2024 0057   GFRAA 58 (L) 03/22/2020 1719      Latest Ref Rng & Units 01/20/2024   12:57 AM 12/05/2023    9:55 AM 07/25/2023    4:28 PM  BMP  Glucose 70 - 99 mg/dL 891  91  99   BUN 6 - 20 mg/dL 14  14  16    Creatinine 0.61 - 1.24 mg/dL 8.57  8.62  8.49   Sodium 135 - 145 mmol/L 137  139  135   Potassium 3.5 - 5.1 mmol/L 3.7  4.7  4.1   Chloride 98 - 111 mmol/L 105  103  100   CO2 22 - 32 mmol/L 23  30  24    Calcium 8.9 - 10.3 mg/dL 8.4  8.9  8.9        Component Value Date/Time   WBC 9.9 01/20/2024 0057   RBC 4.85 01/20/2024 0057   HGB 14.6 01/20/2024 0057   HCT 43.4 01/20/2024 0057   PLT 197 01/20/2024 0057   MCV 89.5 01/20/2024 0057   MCV 95.5 05/05/2013 1614   MCH 30.1 01/20/2024 0057   MCHC 33.6 01/20/2024 0057   RDW 14.2 01/20/2024 0057   LYMPHSABS 1.6 12/05/2023 0955   MONOABS 0.7 12/05/2023 0955   EOSABS 0.2 12/05/2023 0955   BASOSABS 0.0 12/05/2023 0955      Parts of this note may  have been dictated using voice recognition software. There may be variances in spelling and vocabulary which are unintentional. Not all errors are proofread. Please notify the dino if any discrepancies are noted or if the meaning of any statement is not clear.

## 2024-06-19 ENCOUNTER — Ambulatory Visit: Payer: Self-pay

## 2024-06-19 ENCOUNTER — Telehealth: Payer: Self-pay | Admitting: *Deleted

## 2024-06-19 NOTE — Telephone Encounter (Signed)
 FYI Only or Action Required?: FYI only for provider.  Patient was last seen in primary care on 04/05/2024 by Micheal Wolm ORN, MD.  Called Nurse Triage reporting Back Pain, Foot Pain, and Elbow Pain.  Symptoms began off and on the past few weeks.  Interventions attempted: OTC medications: Tylenol .  Symptoms are: gradually worsening.  Triage Disposition: See HCP Within 4 Hours (Or PCP Triage)  Patient/caregiver understands and will follow disposition?: Yes--Patient states he going to go to Urgent Care            Copied from CRM #[id number]. Topic: Clinical - Red Word Triage >> Jun 19, 2024  3:34 PM Mesmerise C wrote: Red Word that prompted transfer to Nurse Triage: Patient stated he's having gout flare up in a lot pain in his foot and elbow pops up in random places started happening yesterday, requested prednisone  for it Reason for Disposition  [1] SEVERE pain (e.g., excruciating, unable to do any normal activities) AND [2] not improved after 2 hours of pain medicine  Answer Assessment - Initial Assessment Questions Patient goes to a kidney doctor---advised Stage 3a kidney disease     1. LOCATION: Where does it hurt? (e.g., left, right)     right 2. ONSET: When did the pain start?     Off and on the past couple of weeks 3. SEVERITY: How bad is the pain? (e.g., Scale 1-10; mild, moderate, or severe)     6 4. PATTERN: Does the pain come and go, or is it constant?      Feels like a cramp there 5. CAUSE: What do you think is causing the pain?     unsure 6. OTHER SYMPTOMS:  Do you have any other symptoms? (e.g., fever, abdomen pain, vomiting, leg weakness, burning with urination, blood in urine)     Loss of appetite just today  Answer Assessment - Initial Assessment Questions Patient also having right flank pain Patient goes to a kidney doctor---advised Stage 3a kidney disease Patient states history of gout and pain is severe Patient advised that the  recommendation is to be seen in the next four hours for severe pain No availability is available in the PCP office today so patient states that he is going to go to Urgent Care Patient is advised that if anything worsens to go to the Emergency Room. Patient verbalized understanding.   1. ONSET: When did the pain start?      Noticed yesterday 2. LOCATION: Where is the pain located?      Right foot 3. PAIN: How bad is the pain?    (Scale 1-10; or mild, moderate, severe)     10 4. WORK OR EXERCISE: Has there been any recent work or exercise that involved this part of the body?      Daily use 5. CAUSE: What do you think is causing the foot pain?     Possibly gout per patient 6. OTHER SYMPTOMS: Do you have any other symptoms? (e.g., leg pain, rash, fever, numbness)     Left elbow, back pain (right flank)  Protocols used: Flank Pain-A-AH, Foot Pain-A-AH

## 2024-06-19 NOTE — Telephone Encounter (Signed)
 Copied from CRM [id number]. Topic: Clinical - Medication Question >> Jun 19, 2024  1:37 PM Burnard DEL wrote: Reason for CRM: Patient called in stating that he is having a gout flare up and would like to know if Provider could sent in a prescriptions for prednisone  for him.He stated that he prescribed him that  medication before when he had a flare up.   [facility] [id number] [place], [place] - [address]  Phone: [phone] Fax: [phone]

## 2024-06-20 NOTE — Telephone Encounter (Signed)
 I spoke with the patient and he reported he was seen in urgent care yesterday and was prescribed Prednisone  and Colchicine . Patient reported he is feeling somewhat better after his visit at urgent care.

## 2024-07-03 ENCOUNTER — Encounter: Payer: Self-pay | Admitting: Pulmonary Disease

## 2024-07-04 ENCOUNTER — Ambulatory Visit (HOSPITAL_COMMUNITY)
Admission: RE | Admit: 2024-07-04 | Discharge: 2024-07-04 | Disposition: A | Discharge status: Home / Self Care | Source: Ambulatory Visit | Attending: "Endocrinology | Admitting: "Endocrinology

## 2024-07-04 DIAGNOSIS — E042 Nontoxic multinodular goiter: Secondary | ICD-10-CM | POA: Diagnosis present

## 2024-07-09 ENCOUNTER — Ambulatory Visit
Admission: RE | Admit: 2024-07-09 | Discharge: 2024-07-09 | Disposition: A | Discharge status: Home / Self Care | Source: Ambulatory Visit | Attending: Pulmonary Disease | Admitting: Pulmonary Disease

## 2024-07-09 DIAGNOSIS — R911 Solitary pulmonary nodule: Secondary | ICD-10-CM

## 2024-07-11 ENCOUNTER — Encounter: Payer: Self-pay | Admitting: "Endocrinology

## 2024-07-24 ENCOUNTER — Ambulatory Visit: Payer: Self-pay | Admitting: Pulmonary Disease

## 2024-07-24 DIAGNOSIS — R911 Solitary pulmonary nodule: Secondary | ICD-10-CM

## 2024-08-08 ENCOUNTER — Encounter: Payer: Self-pay | Admitting: Internal Medicine

## 2024-08-08 ENCOUNTER — Ambulatory Visit: Payer: Self-pay | Admitting: *Deleted

## 2024-08-08 NOTE — Telephone Encounter (Signed)
 FYI Only or Action Required?: FYI only for provider: appointment scheduled on 10/31.  Patient was last seen in primary care on 04/05/2024 by Micheal Wolm ORN, MD.  Called Nurse Triage reporting Chest Pain.  Symptoms began yesterday- but has history of episodic chest pain.  Interventions attempted: Rest, hydration, or home remedies.  Symptoms are: stable.  Triage Disposition: See Physician Within 24 Hours  Patient/caregiver understands and will follow disposition?: yes  Copied from CRM #[id number]. Topic: Clinical - Red Word Triage >> Aug 08, 2024  9:12 AM Rea BROCKS wrote: Red Word that prompted transfer to Nurse Triage: Muscle spasm in chest and then got real dizzy. Then started having left side neck behind head numbness. Reason for Disposition  [1] Chest pain lasts < 5 minutes AND [2] NO chest pain or cardiac symptoms (e.g., breathing difficulty, sweating) now  (Exception: Chest pains that last only a few seconds.)  Answer Assessment - Initial Assessment Questions 1. LOCATION: Where does it hurt?       No pain today 2. RADIATION: Does the pain go anywhere else? (e.g., into neck, jaw, arms, back)     no 3. ONSET: When did the chest pain begin? (Minutes, hours or days)      At work yesterday- center of chest had pain- took breath, dizziness-felt faint, headache and migraines.  4. PATTERN: Does the pain come and go, or has it been constant since it started?  Does it get worse with exertion?      Comes and goes 5. DURATION: How long does it last (e.g., seconds, minutes, hours)     seconds 6. SEVERITY: How bad is the pain?  (e.g., Scale 1-10; mild, moderate, or severe)     No pain today- episodic- can be severe when happens- like a muscle spasm 7. CARDIAC RISK FACTORS: Do you have any history of heart problems or risk factors for heart disease? (e.g., angina, prior heart attack; diabetes, high blood pressure, high cholesterol, smoker, or strong family history of heart  disease)     High cholesterol, slow heart rate 8. PULMONARY RISK FACTORS: Do you have any history of lung disease?  (e.g., blood clots in lung, asthma, emphysema, birth control pills)     Lung nodules 9. CAUSE: What do you think is causing the chest pain?     Unsure- episodic and patient reports he has been checked for this 10. OTHER SYMPTOMS: Do you have any other symptoms? (e.g., dizziness, nausea, vomiting, sweating, fever, difficulty breathing, cough)       Diarrhea, headache- numbness after sleep  Protocols used: Chest Pain-A-AH

## 2024-08-09 ENCOUNTER — Ambulatory Visit: Admitting: Family Medicine

## 2024-08-09 ENCOUNTER — Encounter: Payer: Self-pay | Admitting: Family Medicine

## 2024-08-09 VITALS — BP 120/98 | HR 78 | Temp 98.4°F | Ht 75.0 in | Wt 340.0 lb

## 2024-08-09 DIAGNOSIS — R0789 Other chest pain: Secondary | ICD-10-CM | POA: Diagnosis not present

## 2024-08-09 NOTE — Progress Notes (Signed)
 Established Patient Office Visit  Subjective   Patient ID: Francis Lin, male    DOB: [birth year]  Age: 48 y.o. MRN: [id number]  Chief Complaint  Patient presents with   Migraine   Chest Pain    HPI   Francis Lin is seen today with chief complaint of chest pain.  He states he had recent episode at work on Wednesday.  He is a estate agent.  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  Afterwards felt little dizzy.  No palpitations.  No left arm symptoms.  No diaphoresis.  No nausea or vomiting.  He had similar symptoms in the past.  Had coronary calcium score of 0 last year.  No consistent pattern of exertional chest pains.  He feels like this is probably muscle spasm.  Denies any recent GERD symptoms.  Non-smoker.  Denies any recent chronic cough, pleuritic pain, hemoptysis, fever.  Recent symptoms were very transient and not persistent  Recent CT chest per pulmonary no acute findings.  Past Medical History:  Diagnosis Date   Acid reflux    Allergy    Anxiety    stress    CKD stage 3a, GFR 45-59 ml/min (HCC)    Clotting disorder    right leg x 2 in calf and behind knee    DVT (deep venous thrombosis) (HCC)    Esophagitis    Gout    Hypersomnia, persistent    IBS (irritable bowel syndrome)    Lung nodules    Morbid (severe) obesity due to excess calories (HCC) 02/05/2024   bmi 42.62   PONV (postoperative nausea and vomiting)    nausea with no vomiting    Renal disorder    Shifting sleep-work schedule, affecting sleep 06/20/2013   Snoring disorder 06/20/2013   Thyroid  nodule    Tubular adenoma of colon    Past Surgical History:  Procedure Laterality Date   COLON SURGERY     COLONOSCOPY     CYST REMOVAL NECK  04/17/2023   ESOPHAGEAL DILATION     HEMORRHOID BANDING     ORIF TIBIA & FIBULA FRACTURES Right    right leg surgery x 2 places- rod in place knee to ankle    UPPER  GASTROINTESTINAL ENDOSCOPY     wisdom teeth     wrist surgery Right     reports that he has never smoked. He has been exposed to tobacco smoke. He has never used smokeless tobacco. He reports that he does not drink alcohol and does not use drugs. family history includes ADD / ADHD in his daughter and daughter; Adrenal disorder in his sister; Arthritis in his father; Asthma in his daughter; Atrial fibrillation in his father; Bipolar disorder in his sister; Cancer in his father; Diabetes in his father and sister; Eczema in his daughter, daughter, and daughter; Edema in his sister; Gout in his father; Heart attack in his father; High Cholesterol in his daughter; Hypertension in his father; Hypotension in his daughter; Kidney failure in his father; Lupus in his maternal aunt and mother; Obesity in his sister; Stroke in his sister. Allergies  Allergen Reactions   Ibuprofen  Other (See Comments)    Early onset of kidney issues (Stage 3A)   Peanuts [Peanut Oil] Anaphylaxis   Lactose Diarrhea and Other (See Comments)    Lactose intolerance Lactose intolerance Lactose intolerance   Allopurinol  Rash    rash  Caffeine Other (See Comments)    makes feel bad, headaches   Gabapentin Other (See Comments)    Messes with memory   Omeprazole Other (See Comments)    Mood swing   Penicillins Other (See Comments)    Headaches    Seasonal Ic [Cholestatin] Itching    Sneezing,runny nose , itchy eyes   Tape Rash    Adhesive tape- caused rash    Review of Systems  Constitutional:  Negative for chills and fever.  Eyes:  Negative for blurred vision.  Respiratory:  Negative for cough, hemoptysis, sputum production, shortness of breath and wheezing.   Cardiovascular:  Positive for chest pain. Negative for palpitations, leg swelling and PND.       See HPI  Neurological:  Negative for focal weakness.      Objective:     BP (!) 120/98   Pulse 78   Temp 98.4 F (36.9 C) (Oral)   Ht 6' 3 (1.905 m)    Wt (!) 340 lb (154.2 kg)   SpO2 98%   BMI 42.50 kg/m  BP Readings from Last 3 Encounters:  08/09/24 (!) 120/98  06/18/24 130/80  06/04/24 126/73   Wt Readings from Last 3 Encounters:  08/09/24 (!) 340 lb (154.2 kg)  06/18/24 (!) 340 lb (154.2 kg)  06/04/24 (!) 335 lb (152 kg)      Physical Exam Vitals reviewed.  Constitutional:      General: He is not in acute distress.    Appearance: He is not ill-appearing.  Cardiovascular:     Rate and Rhythm: Normal rate and regular rhythm.     Heart sounds: No murmur heard. Pulmonary:     Effort: Pulmonary effort is normal. No respiratory distress.     Breath sounds: No decreased breath sounds, wheezing, rhonchi or rales.  Neurological:     Mental Status: He is alert.      No results found for any visits on 08/09/24.  Last CBC Lab Results  Component Value Date   WBC 9.9 01/20/2024   HGB 14.6 01/20/2024   HCT 43.4 01/20/2024   MCV 89.5 01/20/2024   MCH 30.1 01/20/2024   RDW 14.2 01/20/2024   PLT 197 01/20/2024   Last metabolic panel Lab Results  Component Value Date   GLUCOSE 108 (H) 01/20/2024   NA 137 01/20/2024   K 3.7 01/20/2024   CL 105 01/20/2024   CO2 23 01/20/2024   BUN 14 01/20/2024   CREATININE 1.42 (H) 01/20/2024   GFRNONAA >60 01/20/2024   CALCIUM 8.4 (L) 01/20/2024   PROT 7.3 01/20/2024   ALBUMIN 3.8 01/20/2024   BILITOT 0.7 01/20/2024   ALKPHOS 71 01/20/2024   AST 58 (H) 01/20/2024   ALT 44 01/20/2024   ANIONGAP 9 01/20/2024   Last lipids Lab Results  Component Value Date   CHOL 145 12/05/2023   HDL 40.20 12/05/2023   LDLCALC 86 12/05/2023   TRIG 92.0 12/05/2023   CHOLHDL 4 12/05/2023   Last hemoglobin A1c Lab Results  Component Value Date   HGBA1C 5.5 06/14/2023   Last thyroid  functions Lab Results  Component Value Date   TSH 2.06 12/05/2023   FREET4 1.15 05/11/2023      The 10-year ASCVD risk score (Arnett DK, et al., 2019) is: 4.2%    Assessment & Plan:    Recent  transient atypical chest pain.Francis Lin  He had previous coronary calcium score a year ago of 0.  Doubt cardiac.  Suspect probably musculoskeletal.  He  describes sensation of spasm.  No pain currently.  Will check EKG but doubt cardiac origin.  EKG shows sinus rhythm with no acute changes.  -Discussed importance of adequate hydration - Strongly advocate he try to lose some weight and establish more consistent exercise.  He expressed some interest in nutrition counseling and we have advised to have him check with his insurance to see if they cover this Benefit he does have chronic comorbidity of obstructive sleep apnea  No follow-ups on file.    Wolm Scarlet, MD

## 2024-08-09 NOTE — Telephone Encounter (Signed)
 Ok to modify recall to 3 years given hx of adenomatous polyps including those > 1 cm

## 2024-08-09 NOTE — Patient Instructions (Signed)
 Stay well hydrated  Consider low dose magnesium supplementation 250-400 mg daily  EKG today is normal.    Try to establish more consistent exercise.
